# Patient Record
Sex: Female | Born: 1940 | Race: White | Hispanic: No | Smoking: Never smoker
Health system: Southern US, Community
[De-identification: ages and names within clinical notes are randomized; demographics above are authoritative.]

## PROBLEM LIST (undated history)

## (undated) DIAGNOSIS — T7840XA Allergy, unspecified, initial encounter: Secondary | ICD-10-CM

## (undated) DIAGNOSIS — I480 Paroxysmal atrial fibrillation: Secondary | ICD-10-CM

## (undated) DIAGNOSIS — I10 Essential (primary) hypertension: Secondary | ICD-10-CM

## (undated) DIAGNOSIS — M1990 Unspecified osteoarthritis, unspecified site: Secondary | ICD-10-CM

## (undated) DIAGNOSIS — I499 Cardiac arrhythmia, unspecified: Secondary | ICD-10-CM

## (undated) DIAGNOSIS — N870 Mild cervical dysplasia: Secondary | ICD-10-CM

## (undated) DIAGNOSIS — E7800 Pure hypercholesterolemia, unspecified: Secondary | ICD-10-CM

## (undated) DIAGNOSIS — I350 Nonrheumatic aortic (valve) stenosis: Secondary | ICD-10-CM

## (undated) DIAGNOSIS — D249 Benign neoplasm of unspecified breast: Secondary | ICD-10-CM

## (undated) DIAGNOSIS — M353 Polymyalgia rheumatica: Secondary | ICD-10-CM

## (undated) DIAGNOSIS — E0590 Thyrotoxicosis, unspecified without thyrotoxic crisis or storm: Secondary | ICD-10-CM

## (undated) DIAGNOSIS — I4891 Unspecified atrial fibrillation: Secondary | ICD-10-CM

## (undated) DIAGNOSIS — R011 Cardiac murmur, unspecified: Secondary | ICD-10-CM

## (undated) DIAGNOSIS — J189 Pneumonia, unspecified organism: Secondary | ICD-10-CM

## (undated) DIAGNOSIS — I71012 Dissection of descending thoracic aorta: Secondary | ICD-10-CM

## (undated) DIAGNOSIS — K219 Gastro-esophageal reflux disease without esophagitis: Secondary | ICD-10-CM

## (undated) DIAGNOSIS — D219 Benign neoplasm of connective and other soft tissue, unspecified: Secondary | ICD-10-CM

## (undated) DIAGNOSIS — I2510 Atherosclerotic heart disease of native coronary artery without angina pectoris: Secondary | ICD-10-CM

## (undated) HISTORY — PX: CATARACT EXTRACTION: SUR2

## (undated) HISTORY — DX: Mild cervical dysplasia: N87.0

## (undated) HISTORY — DX: Allergy, unspecified, initial encounter: T78.40XA

## (undated) HISTORY — PX: JOINT REPLACEMENT: SHX530

## (undated) HISTORY — DX: Atherosclerotic heart disease of native coronary artery without angina pectoris: I25.10

## (undated) HISTORY — PX: EYE SURGERY: SHX253

## (undated) HISTORY — DX: Nonrheumatic aortic (valve) stenosis: I35.0

## (undated) HISTORY — DX: Unspecified atrial fibrillation: I48.91

## (undated) HISTORY — DX: Benign neoplasm of unspecified breast: D24.9

## (undated) HISTORY — PX: BREAST BIOPSY: SHX20

## (undated) HISTORY — DX: Benign neoplasm of connective and other soft tissue, unspecified: D21.9

## (undated) HISTORY — PX: COLONOSCOPY: SHX174

## (undated) HISTORY — DX: Cardiac murmur, unspecified: R01.1

---
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
## 1965-04-13 HISTORY — PX: THYROIDECTOMY: SHX17

## 1968-12-12 HISTORY — PX: BREAST BIOPSY: SHX20

## 1988-04-13 DIAGNOSIS — N870 Mild cervical dysplasia: Secondary | ICD-10-CM

## 1988-04-13 HISTORY — DX: Mild cervical dysplasia: N87.0

## 1998-02-05 ENCOUNTER — Other Ambulatory Visit: Admission: RE | Admit: 1998-02-05 | Discharge: 1998-02-05 | Payer: Self-pay | Admitting: Obstetrics and Gynecology

## 1998-04-13 HISTORY — PX: RETINAL DETACHMENT SURGERY: SHX105

## 1998-11-20 ENCOUNTER — Other Ambulatory Visit: Admission: RE | Admit: 1998-11-20 | Discharge: 1998-11-20 | Payer: Self-pay | Admitting: Obstetrics and Gynecology

## 1999-12-10 ENCOUNTER — Other Ambulatory Visit: Admission: RE | Admit: 1999-12-10 | Discharge: 1999-12-10 | Payer: Self-pay | Admitting: Obstetrics and Gynecology

## 2000-02-11 ENCOUNTER — Encounter: Payer: Self-pay | Admitting: Ophthalmology

## 2000-02-11 ENCOUNTER — Ambulatory Visit (HOSPITAL_COMMUNITY): Admission: RE | Admit: 2000-02-11 | Discharge: 2000-02-11 | Payer: Self-pay | Admitting: Ophthalmology

## 2000-04-13 DIAGNOSIS — D219 Benign neoplasm of connective and other soft tissue, unspecified: Secondary | ICD-10-CM | POA: Insufficient documentation

## 2000-04-13 HISTORY — DX: Benign neoplasm of connective and other soft tissue, unspecified: D21.9

## 2001-02-01 ENCOUNTER — Other Ambulatory Visit: Admission: RE | Admit: 2001-02-01 | Discharge: 2001-02-01 | Payer: Self-pay | Admitting: Obstetrics and Gynecology

## 2001-02-04 ENCOUNTER — Encounter: Payer: Self-pay | Admitting: Obstetrics and Gynecology

## 2001-02-04 ENCOUNTER — Encounter: Admission: RE | Admit: 2001-02-04 | Discharge: 2001-02-04 | Payer: Self-pay | Admitting: Obstetrics and Gynecology

## 2002-01-16 ENCOUNTER — Ambulatory Visit (HOSPITAL_COMMUNITY): Admission: RE | Admit: 2002-01-16 | Discharge: 2002-01-16 | Payer: Self-pay | Admitting: Gastroenterology

## 2002-01-16 ENCOUNTER — Encounter (INDEPENDENT_AMBULATORY_CARE_PROVIDER_SITE_OTHER): Payer: Self-pay | Admitting: Specialist

## 2002-02-27 ENCOUNTER — Other Ambulatory Visit: Admission: RE | Admit: 2002-02-27 | Discharge: 2002-02-27 | Payer: Self-pay | Admitting: Obstetrics and Gynecology

## 2002-09-19 ENCOUNTER — Encounter: Admission: RE | Admit: 2002-09-19 | Discharge: 2002-09-19 | Payer: Self-pay | Admitting: Internal Medicine

## 2002-09-19 ENCOUNTER — Encounter: Payer: Self-pay | Admitting: Internal Medicine

## 2003-01-26 ENCOUNTER — Encounter: Admission: RE | Admit: 2003-01-26 | Discharge: 2003-01-26 | Payer: Self-pay | Admitting: Obstetrics and Gynecology

## 2003-01-26 ENCOUNTER — Encounter: Payer: Self-pay | Admitting: Obstetrics and Gynecology

## 2003-03-05 ENCOUNTER — Other Ambulatory Visit: Admission: RE | Admit: 2003-03-05 | Discharge: 2003-03-05 | Payer: Self-pay | Admitting: Obstetrics and Gynecology

## 2004-03-25 ENCOUNTER — Other Ambulatory Visit: Admission: RE | Admit: 2004-03-25 | Discharge: 2004-03-25 | Payer: Self-pay | Admitting: Obstetrics and Gynecology

## 2005-07-09 ENCOUNTER — Encounter: Admission: RE | Admit: 2005-07-09 | Discharge: 2005-07-09 | Payer: Self-pay | Admitting: Internal Medicine

## 2005-09-23 ENCOUNTER — Other Ambulatory Visit: Admission: RE | Admit: 2005-09-23 | Discharge: 2005-09-23 | Payer: Self-pay | Admitting: Obstetrics and Gynecology

## 2006-04-22 ENCOUNTER — Encounter: Admission: RE | Admit: 2006-04-22 | Discharge: 2006-04-22 | Payer: Self-pay | Admitting: Internal Medicine

## 2006-05-13 ENCOUNTER — Encounter: Admission: RE | Admit: 2006-05-13 | Discharge: 2006-05-13 | Payer: Self-pay | Admitting: Internal Medicine

## 2006-10-07 ENCOUNTER — Other Ambulatory Visit: Admission: RE | Admit: 2006-10-07 | Discharge: 2006-10-07 | Payer: Self-pay | Admitting: Obstetrics and Gynecology

## 2007-01-27 ENCOUNTER — Encounter: Admission: RE | Admit: 2007-01-27 | Discharge: 2007-01-27 | Payer: Self-pay | Admitting: Gastroenterology

## 2007-11-14 ENCOUNTER — Other Ambulatory Visit: Admission: RE | Admit: 2007-11-14 | Discharge: 2007-11-14 | Payer: Self-pay | Admitting: Obstetrics and Gynecology

## 2010-08-29 NOTE — Op Note (Signed)
   Marissa Powers, Marissa Powers                              ACCOUNT NO.:  [id number]   MEDICAL RECORD NO.:  [id number]                   PATIENT TYPE:  AMB   LOCATION:  ENDO                                 FACILITY:  [facility]   PHYSICIAN:  Florencia Reasons, M.D.             DATE OF BIRTH:  Dec 22, 1940   DATE OF PROCEDURE:  01/16/2002  DATE OF DISCHARGE:                                 OPERATIVE REPORT   PROCEDURE:  Colonoscopy with biopsy.   INDICATIONS:  Screening for colon cancer in a 70 year old female.   FINDINGS:  Diminutive rectal polyp.  Minimal diverticulosis.   DESCRIPTION OF PROCEDURE:  The nature, purpose, and risks of the procedure  had been reviewed with the patient, who provided written consent.  Sedation  was fentanyl 100 mcg and Versed 10 mg IV without arrhythmias or  desaturation.  The Olympus adjustable-tension pediatric video colonoscope  was advanced with some looping to the cecum (the adult scope might be a  better choice for future exams).  The quality of the prep was excellent, and  it is felt that all areas were well-seen during pullback.   There was minimal diverticulosis and a 2-3 mm sessile polyp at 19 cm from  the external anal opening, removed by several cold biopsies.  No large  polyps, cancer, colitis, or vascular malformations were observed.  Retroflexion could not be accomplished in the rectum due to a small rectal  ampulla, but antegrade viewing disclosed no distal rectal lesions and  reinspection of the rectosigmoid was unremarkable.  The patient tolerated  the procedure well, and there were no apparent complications.   IMPRESSION:  1. Diminutive rectosigmoid polyp, removed.  2. Minimal diverticulosis.   PLAN:  Await pathology on the polyp.                                               Florencia Reasons, M.D.    RVB/MEDQ  D:  01/16/2002  T:  01/17/2002  Job:  [id number]   cc:   Theressa Millard, M.D.  [address] [place]  [place] [address]  Fax:  [phone]

## 2010-08-29 NOTE — Op Note (Signed)
[name]  Patient:    Marissa Powers, Marissa Powers                           MRN: [id number] Proc. Date: 02/11/00 Adm. Date:  [id number] Disc. Date: [id number] Attending:  Ernesto Rutherford CC:         Oley Balm. Charlann Boxer, M.D.   Operative Report  PREOPERATIVE DIAGNOSES: 1. 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 2. History of retinal break and shallow retinal detachment, treated previously    retinal cryopexy in the office some 10 weeks previously.  PROCEDURE: 1. Scleral buckle, left eye. 2. Retinal cryopexy, left eye. 3. Anterior chamber tap to soften the globe of the left eye.  SURGEON:  Ernesto Rutherford, M.D.  ANESTHESIA:  General endotracheal anesthesia.  INDICATIONS FOR PROCEDURE:  The patient is a 70 year old woman who has visual field loss in the inferotemporal quadrant of her left eye and found to have a new onset floaters two days previous to evaluation on February 09, 2000, where a shallow retinal detachment was verified on examination and on ultrasound in the superonasal quadrant.  The apparent break appears to be a rent of the previous cryopexy and reformation of the break on the basis of persistent and recurrent vitreous retraction in this area.  The patient understands this is an attempt to surgically reattach her retina, to reduce the traction, and to allow for sustaining excellent visual acuity.  She understands the risks of anesthesia, including risk of death and loss of the eye, risks including hemorrhage, complex of infection, scarring, need for further surgery, a change in vision, loss of vision or progressive disease despite intervention.  DESCRIPTION OF PROCEDURE:  After appropriate signed consent was obtained, patient taken to the operating room.  In the operating room, general endotracheal anesthesia instituted without difficulty.  Left intraocular region was sterilely prepped and draped in the usual ophthalmic fashion.  A lid  speculum was applied.  Conjunctival peritomy fashioned from the 7:30 meridian superonasally to the 1 oclock meridian.  The medial and superior rectus muscles were isolated down 2-0 silk ties after entering the intermuscular septum in the inferonasal, superonasal, and superotemporal quadrants.  Indirect ophthalmoscopy was then performed to delineate the extent of the detachment and the margins of the break, which were treated with retinal cryopexy, and the margins of the detachment were delineated with external diathermy to allow for placement of appropriate-sized radial sponge. A 506 radial sponge was selected and secured with two 5-0 Mersilene mattress sutures.  The globe was softened after paracentesis of the anterior chamber to allow for excellent scleral indentation.  The sclera was indented extremely well and the sponge tied into place in a permanent fashion. Then the buckle was irrigated with bug juice.  Indirect ophthalmoscopy confirmed this excellent scleral indentation and support to the retinal break edges and all fluid had been reabsorbed.  Excellent cryopexy was seen.  At this time, the conjunctiva was brought forward and closed with 7-0 Vicryl suture to the limbus.  Subconjunctival injection of antibiotics was applied.  Intravenous antibiotics given.  The patient awakened from anesthesia and taken to the recovery room in stable condition and tolerated the procedure without complication. DD:  02/11/00 TD:  02/12/00 Job: [id number]

## 2011-04-21 DIAGNOSIS — E039 Hypothyroidism, unspecified: Secondary | ICD-10-CM | POA: Diagnosis not present

## 2011-04-21 DIAGNOSIS — R209 Unspecified disturbances of skin sensation: Secondary | ICD-10-CM | POA: Diagnosis not present

## 2011-04-21 DIAGNOSIS — I10 Essential (primary) hypertension: Secondary | ICD-10-CM | POA: Diagnosis not present

## 2011-04-21 DIAGNOSIS — Z23 Encounter for immunization: Secondary | ICD-10-CM | POA: Diagnosis not present

## 2011-04-21 DIAGNOSIS — E7800 Pure hypercholesterolemia, unspecified: Secondary | ICD-10-CM | POA: Diagnosis not present

## 2011-05-19 DIAGNOSIS — Z1231 Encounter for screening mammogram for malignant neoplasm of breast: Secondary | ICD-10-CM | POA: Diagnosis not present

## 2011-05-25 DIAGNOSIS — N6489 Other specified disorders of breast: Secondary | ICD-10-CM | POA: Diagnosis not present

## 2011-07-09 DIAGNOSIS — H33059 Total retinal detachment, unspecified eye: Secondary | ICD-10-CM | POA: Diagnosis not present

## 2011-07-09 DIAGNOSIS — H43819 Vitreous degeneration, unspecified eye: Secondary | ICD-10-CM | POA: Diagnosis not present

## 2011-07-09 DIAGNOSIS — H43399 Other vitreous opacities, unspecified eye: Secondary | ICD-10-CM | POA: Diagnosis not present

## 2011-08-31 DIAGNOSIS — L821 Other seborrheic keratosis: Secondary | ICD-10-CM | POA: Diagnosis not present

## 2011-08-31 DIAGNOSIS — L578 Other skin changes due to chronic exposure to nonionizing radiation: Secondary | ICD-10-CM | POA: Diagnosis not present

## 2011-08-31 DIAGNOSIS — D239 Other benign neoplasm of skin, unspecified: Secondary | ICD-10-CM | POA: Diagnosis not present

## 2011-08-31 DIAGNOSIS — L570 Actinic keratosis: Secondary | ICD-10-CM | POA: Diagnosis not present

## 2011-11-23 DIAGNOSIS — M1710 Unilateral primary osteoarthritis, unspecified knee: Secondary | ICD-10-CM | POA: Diagnosis not present

## 2011-11-23 DIAGNOSIS — IMO0002 Reserved for concepts with insufficient information to code with codable children: Secondary | ICD-10-CM | POA: Diagnosis not present

## 2012-02-05 DIAGNOSIS — Z23 Encounter for immunization: Secondary | ICD-10-CM | POA: Diagnosis not present

## 2012-02-10 DIAGNOSIS — Z01419 Encounter for gynecological examination (general) (routine) without abnormal findings: Secondary | ICD-10-CM | POA: Diagnosis not present

## 2012-02-10 DIAGNOSIS — Z124 Encounter for screening for malignant neoplasm of cervix: Secondary | ICD-10-CM | POA: Diagnosis not present

## 2012-02-12 DIAGNOSIS — Z961 Presence of intraocular lens: Secondary | ICD-10-CM | POA: Diagnosis not present

## 2012-02-16 DIAGNOSIS — M1710 Unilateral primary osteoarthritis, unspecified knee: Secondary | ICD-10-CM | POA: Diagnosis not present

## 2012-02-16 DIAGNOSIS — IMO0002 Reserved for concepts with insufficient information to code with codable children: Secondary | ICD-10-CM | POA: Diagnosis not present

## 2012-02-25 ENCOUNTER — Other Ambulatory Visit: Payer: Self-pay | Admitting: Orthopedic Surgery

## 2012-02-25 MED ORDER — DEXAMETHASONE SODIUM PHOSPHATE 10 MG/ML IJ SOLN: 10.0000 mg | Freq: Once | INTRAMUSCULAR | Status: DC

## 2012-02-25 NOTE — Progress Notes (Signed)
Preoperative surgical orders have been place into the Epic hospital system for Marissa Powers on 02/25/2012, 11:49 AM  by Patrica Duel for surgery on 04/25/12.  Preop Bilateral Total Knee orders including Epidural per Anesthesia, IV Tylenol, and IV Decadron as long as there are no contraindications to the above medications. [name], PA-C

## 2012-03-02 DIAGNOSIS — IMO0002 Reserved for concepts with insufficient information to code with codable children: Secondary | ICD-10-CM | POA: Diagnosis not present

## 2012-03-02 DIAGNOSIS — E7800 Pure hypercholesterolemia, unspecified: Secondary | ICD-10-CM | POA: Diagnosis not present

## 2012-03-02 DIAGNOSIS — E039 Hypothyroidism, unspecified: Secondary | ICD-10-CM | POA: Diagnosis not present

## 2012-03-02 DIAGNOSIS — M1710 Unilateral primary osteoarthritis, unspecified knee: Secondary | ICD-10-CM | POA: Diagnosis not present

## 2012-03-02 DIAGNOSIS — I700 Atherosclerosis of aorta: Secondary | ICD-10-CM | POA: Diagnosis not present

## 2012-03-02 DIAGNOSIS — I10 Essential (primary) hypertension: Secondary | ICD-10-CM | POA: Diagnosis not present

## 2012-03-14 DIAGNOSIS — Z86010 Personal history of colonic polyps: Secondary | ICD-10-CM | POA: Diagnosis not present

## 2012-03-14 DIAGNOSIS — Z09 Encounter for follow-up examination after completed treatment for conditions other than malignant neoplasm: Secondary | ICD-10-CM | POA: Diagnosis not present

## 2012-03-14 DIAGNOSIS — K5730 Diverticulosis of large intestine without perforation or abscess without bleeding: Secondary | ICD-10-CM | POA: Diagnosis not present

## 2012-04-07 ENCOUNTER — Encounter (HOSPITAL_COMMUNITY): Payer: Self-pay

## 2012-04-07 ENCOUNTER — Ambulatory Visit (HOSPITAL_COMMUNITY)
Admission: RE | Admit: 2012-04-07 | Discharge: 2012-04-07 | Disposition: A | Discharge status: Home / Self Care | Payer: Medicare Other | Source: Ambulatory Visit | Attending: Orthopedic Surgery | Admitting: Orthopedic Surgery

## 2012-04-07 ENCOUNTER — Encounter (HOSPITAL_COMMUNITY): Payer: Self-pay | Admitting: Pharmacy Technician

## 2012-04-07 ENCOUNTER — Encounter (HOSPITAL_COMMUNITY)
Admission: RE | Admit: 2012-04-07 | Discharge: 2012-04-07 | Disposition: A | Discharge status: Home / Self Care | Payer: Medicare Other | Source: Ambulatory Visit | Attending: Orthopedic Surgery | Admitting: Orthopedic Surgery

## 2012-04-07 DIAGNOSIS — I10 Essential (primary) hypertension: Secondary | ICD-10-CM | POA: Diagnosis not present

## 2012-04-07 DIAGNOSIS — Z01818 Encounter for other preprocedural examination: Secondary | ICD-10-CM | POA: Diagnosis not present

## 2012-04-07 HISTORY — DX: Pure hypercholesterolemia, unspecified: E78.00

## 2012-04-07 HISTORY — DX: Unspecified osteoarthritis, unspecified site: M19.90

## 2012-04-07 HISTORY — DX: Gastro-esophageal reflux disease without esophagitis: K21.9

## 2012-04-07 HISTORY — DX: Essential (primary) hypertension: I10

## 2012-04-07 HISTORY — DX: Thyrotoxicosis, unspecified without thyrotoxic crisis or storm: E05.90

## 2012-04-07 LAB — CBC
HCT: 36.2 % (ref 36.0–46.0)
Hemoglobin: 12.3 g/dL (ref 12.0–15.0)
MCH: 32.9 pg (ref 26.0–34.0)
MCHC: 34 g/dL (ref 30.0–36.0)
MCV: 96.8 fL (ref 78.0–100.0)
Platelets: 235 10*3/uL (ref 150–400)
RBC: 3.74 MIL/uL — ABNORMAL LOW (ref 3.87–5.11)
RDW: 13 % (ref 11.5–15.5)
WBC: 6.2 10*3/uL (ref 4.0–10.5)

## 2012-04-07 LAB — URINALYSIS, ROUTINE W REFLEX MICROSCOPIC
Bilirubin Urine: NEGATIVE
Glucose, UA: NEGATIVE mg/dL
Hgb urine dipstick: NEGATIVE
Ketones, ur: NEGATIVE mg/dL
Nitrite: NEGATIVE
Protein, ur: NEGATIVE mg/dL
Specific Gravity, Urine: 1.023 (ref 1.005–1.030)
Urobilinogen, UA: 0.2 mg/dL (ref 0.0–1.0)
pH: 5.5 (ref 5.0–8.0)

## 2012-04-07 LAB — COMPREHENSIVE METABOLIC PANEL
ALT: 11 U/L (ref 0–35)
AST: 16 U/L (ref 0–37)
Albumin: 4.1 g/dL (ref 3.5–5.2)
Alkaline Phosphatase: 59 U/L (ref 39–117)
BUN: 16 mg/dL (ref 6–23)
CO2: 27 mEq/L (ref 19–32)
Calcium: 10.1 mg/dL (ref 8.4–10.5)
Chloride: 104 mEq/L (ref 96–112)
Creatinine, Ser: 0.71 mg/dL (ref 0.50–1.10)
GFR calc Af Amer: >90 mL/min (ref >90)
GFR calc non Af Amer: 85 mL/min — ABNORMAL LOW (ref >90)
Glucose, Bld: 99 mg/dL (ref 70–99)
Potassium: 4.6 mEq/L (ref 3.5–5.1)
Sodium: 140 mEq/L (ref 135–145)
Total Bilirubin: 0.6 mg/dL (ref 0.3–1.2)
Total Protein: 7.5 g/dL (ref 6.0–8.3)

## 2012-04-07 LAB — URINE MICROSCOPIC-ADD ON

## 2012-04-07 LAB — SURGICAL PCR SCREEN
MRSA, PCR: NEGATIVE
Staphylococcus aureus: NEGATIVE

## 2012-04-07 LAB — PROTIME-INR
INR: 1.01 (ref 0.00–1.49)
Prothrombin Time: 13.2 seconds (ref 11.6–15.2)

## 2012-04-07 LAB — APTT: aPTT: 30 seconds (ref 24–37)

## 2012-04-07 NOTE — Patient Instructions (Addendum)
20 SYLVESTER SALONGA  04/07/2012   Your procedure is scheduled on: 04/25/12  Report to [facility] at [phone].  Call this number if you have problems the morning of surgery 336-: [phone]   Remember:   Do not eat food or drink liquids After Midnight.     Take these medicines the morning of surgery with A SIP OF WATER: nexium, synthroid   Do not wear jewelry, make-up or nail polish.  Do not wear lotions, powders, or perfumes. You may wear deodorant.  Do not shave 48 hours prior to surgery. Men may shave face and neck.  Do not bring valuables to the hospital.  Contacts, dentures or bridgework may not be worn into surgery.  Leave suitcase in the car. After surgery it may be brought to your room.  For patients admitted to the hospital, checkout time is 11:00 AM the day of discharge.     Please read over the following fact sheets that you were given: MRSA Information, blood fact sheet, incentive spirometer fact sheet Birdie Sons, RN  pre op nurse call if needed [phone]    FAILURE TO FOLLOW THESE INSTRUCTIONS MAY RESULT IN CANCELLATION OF YOUR SURGERY   Patient Signature: ___________________________________________

## 2012-04-08 LAB — URINE CULTURE
Colony Count: NO GROWTH
Culture: NO GROWTH

## 2012-04-11 ENCOUNTER — Other Ambulatory Visit: Payer: Self-pay | Admitting: Orthopedic Surgery

## 2012-04-11 NOTE — Progress Notes (Signed)
Surgery clearance note from Dr. Earl Gala 04/21/11 on chart

## 2012-04-11 NOTE — H&P (Signed)
Marissa Powers  DOB: 11/26/1940 Married / Language: English / Race: White Female  Date of Admission:  04/25/2012  Chief complaint:  Bilateral Knee Pain  History of Present Illness The patient is a 71 year old female who comes in for a preoperative History and Physical. The patient is scheduled for a bilateral total knee arthroplasty to be performed by Dr. Frank V. Aluisio, MD at [facility] on 04/25/2012. The patient is a 71 year old female who presents with knee complaints. The patient reports left knee (worse than right) symptoms including: pain, instability, catching and stiffness which began year(s) ago.The patient feels that the symptoms are worsening. The patient has the current diagnosis of knee osteoarthritis. Prior to being seen today the patient was previously evaluated in this clinic (by Dr. Donn). Previous work-up for this problem has included knee x-rays. Past treatment for this problem has included intra-articular injection of corticosteroids (recent injections have not helped). Current treatment includes nonsteroidal anti-inflammatory drugs (Advil prn). Marissa Powers feels as though the knees are preventing her from doing the things she desires. She used to be an avid tennis player and used to get out and do a lot of walking. Because of the knees she is not doing as much as she used to. She would like to improve her activity level but can not do so because of her knees. She is not having any swelling. Pain occurs mainly with activity. She is able to sleep at night. Knees occasionally feel like they want to give out on her. She can not trust what her knees are going to do especially on uneven ground or stairs. She has had cortisone injections in the past with less benefit with each successive injection. She has not had visco supplements in the past.  She is now ready to proceed with bilateral knee replacement procedures. They have been treated conservatively in the past for  the above stated problem and despite conservative measures, they continue to have progressive pain and severe functional limitations and dysfunction. They have failed non-operative management including home exercise, medications, and injections. It is felt that they would benefit from undergoing total joint replacement. Risks and benefits of the procedure have been discussed with the patient and they elect to proceed with surgery. There are no active contraindications to surgery such as ongoing infection or rapidly progressive neurological disease.   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 grandmother mothers side Heart Disease. grandfather mothers side and grandfather fathers side Hypertension. mother   Social History Living situation. live alone Marital status. widowed Number of flights of stairs before winded. 2-3 Drug/Alcohol Rehab (Currently). no Exercise. Exercises weekly; does running / walking and other Illicit drug use. no Pain Contract. no Previously in rehab. no Tobacco / smoke exposure. no Tobacco use. never smoker Current work status. retired Alcohol use. current drinker; drinks wine and hard liquor; 5-7 per week Children. 2 Post-Surgical Plans. Plan is to go to [facility]. Advance Directives. Living Will, Healthcare POA   Medication History Metoprolol Tartrate (25MG Tablet, Oral) Active. Lisinopril (20MG Tablet, Oral) Active. NexIUM (40MG Capsule DR, Oral) Active. Synthroid (112MCG Tablet, Oral) Active. Vitamin D ( Oral) Specific dose unknown - Active. Aspirin EC (81MG Tablet DR, Oral) Active. Fish Oil Active. Citracal/Vitamin D ( Oral) Specific dose unknown - Active.   Pregnancy / Birth History Pregnant. no   Past Surgical History Tonsillectomy Cataract Surgery. bilateral Colon Polyp   Removal - Colonoscopy Thyroidectomy; Subtotal   Medical  History Lumbar stenosis (724.02) Hyperthyroidism High blood pressure Hypercholesterolemia Gastroesophageal Reflux Disease Menopause   Review of Systems General:Not Present- Chills, Fever, Night Sweats, Fatigue, Weight Gain, Weight Loss and Memory Loss. Skin:Not Present- Hives, Itching, Rash, Eczema and Lesions. HEENT:Not Present- Tinnitus, Headache, Double Vision, Visual Loss, Hearing Loss and Dentures. Respiratory:Not Present- Shortness of breath with exertion, Shortness of breath at rest, Allergies, Coughing up blood and Chronic Cough. Cardiovascular:Not Present- Chest Pain, Racing/skipping heartbeats, Difficulty Breathing Lying Down, Murmur, Swelling and Palpitations. Gastrointestinal:Not Present- Bloody Stool, Heartburn, Abdominal Pain, Vomiting, Nausea, Constipation, Diarrhea, Difficulty Swallowing, Jaundice and Loss of appetitie. Female Genitourinary:Not Present- Blood in Urine, Urinary frequency, Weak urinary stream, Discharge, Flank Pain, Incontinence, Painful Urination, Urgency, Urinary Retention and Urinating at Night. Musculoskeletal:Present- Joint Pain. Not Present- Muscle Weakness, Muscle Pain, Joint Swelling, Back Pain, Morning Stiffness and Spasms. Neurological:Not Present- Tremor, Dizziness, Blackout spells, Paralysis, Difficulty with balance and Weakness. Psychiatric:Not Present- Insomnia.   Vitals Weight: 180 lb Height: 65.5 in Weight was reported by patient. Height was reported by patient. Body Surface Area: 1.94 m Body Mass Index: 29.5 kg/m Pulse: 68 (Regular) Resp.: 12 (Unlabored) BP: 148/82 (Sitting, Left Arm, Standard)    Physical Exam The physical exam findings are as follows:  Note: Patient is a 71 year old female with continued knee pain.   General Mental Status - Alert, cooperative and good historian. General Appearance- pleasant. Not in acute distress. Orientation- Oriented X3. Build & Nutrition- Well nourished and  Well developed.   Head and Neck Head- normocephalic, atraumatic . Neck Global Assessment- supple. no bruit auscultated on the right and no bruit auscultated on the left.   Eye Pupil- Bilateral- Regular and Round. Motion- Bilateral- EOMI.   Chest and Lung Exam Auscultation: Breath sounds:- clear at anterior chest wall and - clear at posterior chest wall. Adventitious sounds:- No Adventitious sounds.   Cardiovascular Auscultation:Rhythm- Regular rate and rhythm. Heart Sounds- S1 WNL and S2 WNL. Murmurs & Other Heart Sounds:Auscultation of the heart reveals - No Murmurs.   Abdomen Palpation/Percussion:Tenderness- Abdomen is non-tender to palpation. Rigidity (guarding)- Abdomen is soft. Auscultation:Auscultation of the abdomen reveals - Bowel sounds normal.   Female Genitourinary Not done, not pertinent to present illness  Musculoskeletal Very pleasant, well developed female alert and oriented in no apparent distress. Evaluation of her hips show normal range of motion with no discomfort. Her left knee shows varus deformity. Range about 5 to 125. She has tenderness over the medial greater than lateral jointline. There is no instability noted about the knee. Right knee less varus. Range 5 to 125. Moderate crepitus on range of motion. Tender medial greater than lateral with no instability noted. Pulses, sensation and motor are intact both lower extremities. She has varus thrust gait pattern on both knees.  RADIOGRAPHS: Radiographs are reviewed, AP both knees and lateral showing bone on bone arthritis in the medial and patellofemoral compartments of both knees. She has varus deformity left worse than right knee.  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 Aluisio.  Plan is to go to [facility] following the hospital stay.  PCP - Dr. James Osborne - Patient has been seen  preoperatively and felt to be stable for surgery. Cards - Dr. Hank Smith  Signed electronically by DREW L Valeen Borys, PA-C 

## 2012-04-11 NOTE — Progress Notes (Signed)
Abnormal urine results routed to Dr. Lequita Halt via [facility]

## 2012-04-25 ENCOUNTER — Encounter (HOSPITAL_COMMUNITY): Payer: Self-pay | Admitting: *Deleted

## 2012-04-25 ENCOUNTER — Inpatient Hospital Stay (HOSPITAL_COMMUNITY): Payer: Medicare Other | Admitting: Anesthesiology

## 2012-04-25 ENCOUNTER — Encounter (HOSPITAL_COMMUNITY): Payer: Self-pay | Admitting: Anesthesiology

## 2012-04-25 ENCOUNTER — Inpatient Hospital Stay (HOSPITAL_COMMUNITY)
Admission: RE | Admit: 2012-04-25 | Discharge: 2012-04-29 | DRG: 462 | Disposition: A | Discharge status: Skilled Nursing Facility | Payer: Medicare Other | Source: Ambulatory Visit | Attending: Orthopedic Surgery | Admitting: Orthopedic Surgery

## 2012-04-25 ENCOUNTER — Encounter (HOSPITAL_COMMUNITY)
Admission: RE | Disposition: A | Discharge status: Skilled Nursing Facility | Payer: Self-pay | Source: Ambulatory Visit | Attending: Orthopedic Surgery

## 2012-04-25 DIAGNOSIS — E7800 Pure hypercholesterolemia, unspecified: Secondary | ICD-10-CM | POA: Diagnosis present

## 2012-04-25 DIAGNOSIS — D62 Acute posthemorrhagic anemia: Secondary | ICD-10-CM | POA: Diagnosis not present

## 2012-04-25 DIAGNOSIS — M1710 Unilateral primary osteoarthritis, unspecified knee: Secondary | ICD-10-CM | POA: Diagnosis not present

## 2012-04-25 DIAGNOSIS — Z79899 Other long term (current) drug therapy: Secondary | ICD-10-CM

## 2012-04-25 DIAGNOSIS — M179 Osteoarthritis of knee, unspecified: Secondary | ICD-10-CM | POA: Diagnosis present

## 2012-04-25 DIAGNOSIS — M6281 Muscle weakness (generalized): Secondary | ICD-10-CM | POA: Diagnosis not present

## 2012-04-25 DIAGNOSIS — S8990XA Unspecified injury of unspecified lower leg, initial encounter: Secondary | ICD-10-CM | POA: Diagnosis not present

## 2012-04-25 DIAGNOSIS — Z96659 Presence of unspecified artificial knee joint: Secondary | ICD-10-CM

## 2012-04-25 DIAGNOSIS — M1990 Unspecified osteoarthritis, unspecified site: Secondary | ICD-10-CM | POA: Diagnosis not present

## 2012-04-25 DIAGNOSIS — K219 Gastro-esophageal reflux disease without esophagitis: Secondary | ICD-10-CM | POA: Diagnosis present

## 2012-04-25 DIAGNOSIS — G4700 Insomnia, unspecified: Secondary | ICD-10-CM | POA: Diagnosis not present

## 2012-04-25 DIAGNOSIS — R269 Unspecified abnormalities of gait and mobility: Secondary | ICD-10-CM | POA: Diagnosis not present

## 2012-04-25 DIAGNOSIS — I10 Essential (primary) hypertension: Secondary | ICD-10-CM | POA: Diagnosis present

## 2012-04-25 DIAGNOSIS — R279 Unspecified lack of coordination: Secondary | ICD-10-CM | POA: Diagnosis not present

## 2012-04-25 DIAGNOSIS — G8918 Other acute postprocedural pain: Secondary | ICD-10-CM | POA: Diagnosis not present

## 2012-04-25 DIAGNOSIS — Z471 Aftercare following joint replacement surgery: Secondary | ICD-10-CM | POA: Diagnosis not present

## 2012-04-25 DIAGNOSIS — M48061 Spinal stenosis, lumbar region without neurogenic claudication: Secondary | ICD-10-CM | POA: Diagnosis present

## 2012-04-25 DIAGNOSIS — K2100 Gastro-esophageal reflux disease with esophagitis, without bleeding: Secondary | ICD-10-CM | POA: Diagnosis not present

## 2012-04-25 DIAGNOSIS — E039 Hypothyroidism, unspecified: Secondary | ICD-10-CM | POA: Diagnosis not present

## 2012-04-25 DIAGNOSIS — E0590 Thyrotoxicosis, unspecified without thyrotoxic crisis or storm: Secondary | ICD-10-CM | POA: Diagnosis not present

## 2012-04-25 DIAGNOSIS — E785 Hyperlipidemia, unspecified: Secondary | ICD-10-CM | POA: Diagnosis not present

## 2012-04-25 DIAGNOSIS — M25569 Pain in unspecified knee: Secondary | ICD-10-CM | POA: Diagnosis not present

## 2012-04-25 DIAGNOSIS — IMO0002 Reserved for concepts with insufficient information to code with codable children: Secondary | ICD-10-CM | POA: Diagnosis not present

## 2012-04-25 HISTORY — PX: TOTAL KNEE ARTHROPLASTY: SHX125

## 2012-04-25 LAB — TYPE AND SCREEN
ABO/RH(D): O POS
Antibody Screen: NEGATIVE

## 2012-04-25 LAB — ABO/RH: ABO/RH(D): O POS

## 2012-04-25 SURGERY — ARTHROPLASTY, KNEE, BILATERAL, TOTAL
Anesthesia: Spinal | Site: Knee | Laterality: Bilateral | Wound class: Clean

## 2012-04-25 MED ORDER — TRAMADOL HCL 50 MG PO TABS: 50.0000 mg | ORAL_TABLET | Freq: Four times a day (QID) | ORAL | Status: DC | PRN

## 2012-04-25 MED ORDER — PHENOL 1.4 % MT LIQD: 1.0000 | OROMUCOSAL | Status: DC | PRN

## 2012-04-25 MED ORDER — MORPHINE SULFATE 2 MG/ML IJ SOLN
1.0000 mg | INTRAMUSCULAR | Status: DC | PRN
  Administered 2012-04-25: 2 mg via INTRAVENOUS
  Administered 2012-04-25 – 2012-04-28 (×2): 1 mg via INTRAVENOUS
  Filled 2012-04-25 (×3): qty 1

## 2012-04-25 MED ORDER — TRANEXAMIC ACID 100 MG/ML IV SOLN
1230.0000 mg | INTRAVENOUS | Status: AC
  Administered 2012-04-25: 1230 mg via INTRAVENOUS
  Filled 2012-04-25: qty 12.3

## 2012-04-25 MED ORDER — SIMVASTATIN 40 MG PO TABS
40.0000 mg | ORAL_TABLET | Freq: Every day | ORAL | Status: DC
  Administered 2012-04-25 – 2012-04-28 (×4): 40 mg via ORAL
  Filled 2012-04-25 (×5): qty 1

## 2012-04-25 MED ORDER — NALOXONE HCL 0.4 MG/ML IJ SOLN: 0.4000 mg | INTRAMUSCULAR | Status: DC | PRN

## 2012-04-25 MED ORDER — NALBUPHINE HCL 10 MG/ML IJ SOLN
5.0000 mg | INTRAMUSCULAR | Status: DC | PRN
  Filled 2012-04-25: qty 1

## 2012-04-25 MED ORDER — SODIUM CHLORIDE 0.9 % IV SOLN
INTRAVENOUS | Status: DC
  Administered 2012-04-25 – 2012-04-26 (×2): via INTRAVENOUS
  Administered 2012-04-27: 20 mL/h via INTRAVENOUS
  Administered 2012-04-27: 04:00:00 via INTRAVENOUS

## 2012-04-25 MED ORDER — METOCLOPRAMIDE HCL 5 MG/ML IJ SOLN
5.0000 mg | Freq: Three times a day (TID) | INTRAMUSCULAR | Status: DC | PRN
  Filled 2012-04-25: qty 2

## 2012-04-25 MED ORDER — LACTATED RINGERS IV SOLN
INTRAVENOUS | Status: DC
  Administered 2012-04-25: 12:00:00 via INTRAVENOUS
  Administered 2012-04-25: 1000 mL via INTRAVENOUS
  Administered 2012-04-25: 11:00:00 via INTRAVENOUS

## 2012-04-25 MED ORDER — BISACODYL 10 MG RE SUPP
10.0000 mg | Freq: Every day | RECTAL | Status: DC | PRN
  Administered 2012-04-29: 10 mg via RECTAL
  Filled 2012-04-25: qty 1

## 2012-04-25 MED ORDER — METOPROLOL SUCCINATE ER 25 MG PO TB24
25.0000 mg | ORAL_TABLET | Freq: Every day | ORAL | Status: DC
  Administered 2012-04-26 – 2012-04-27 (×2): 25 mg via ORAL
  Filled 2012-04-25 (×5): qty 1

## 2012-04-25 MED ORDER — METHOCARBAMOL 500 MG PO TABS
500.0000 mg | ORAL_TABLET | Freq: Four times a day (QID) | ORAL | Status: DC | PRN
  Administered 2012-04-27 – 2012-04-29 (×6): 500 mg via ORAL
  Filled 2012-04-25 (×7): qty 1

## 2012-04-25 MED ORDER — CEFAZOLIN SODIUM 1-5 GM-% IV SOLN
1.0000 g | Freq: Four times a day (QID) | INTRAVENOUS | Status: AC
  Administered 2012-04-25 (×2): 1 g via INTRAVENOUS
  Filled 2012-04-25 (×2): qty 50

## 2012-04-25 MED ORDER — ONDANSETRON HCL 4 MG/2ML IJ SOLN: 4.0000 mg | Freq: Four times a day (QID) | INTRAMUSCULAR | Status: DC | PRN

## 2012-04-25 MED ORDER — POLYETHYLENE GLYCOL 3350 17 G PO PACK
17.0000 g | PACK | Freq: Every day | ORAL | Status: DC | PRN
  Administered 2012-04-26: 17 g via ORAL

## 2012-04-25 MED ORDER — BUPIVACAINE-EPINEPHRINE PF 0.25-1:200000 % IJ SOLN
INTRAMUSCULAR | Status: DC | PRN
  Administered 2012-04-25: 5 mL

## 2012-04-25 MED ORDER — DIPHENHYDRAMINE HCL 50 MG/ML IJ SOLN: 12.5000 mg | INTRAMUSCULAR | Status: DC | PRN

## 2012-04-25 MED ORDER — DEXTROSE 5 % IV SOLN: 3.0000 g | INTRAVENOUS | Status: DC

## 2012-04-25 MED ORDER — 0.9 % SODIUM CHLORIDE (POUR BTL) OPTIME
TOPICAL | Status: DC | PRN
  Administered 2012-04-25: 1000 mL

## 2012-04-25 MED ORDER — ACETAMINOPHEN 650 MG RE SUPP: 650.0000 mg | Freq: Four times a day (QID) | RECTAL | Status: DC | PRN

## 2012-04-25 MED ORDER — DIPHENHYDRAMINE HCL 12.5 MG/5ML PO ELIX: 12.5000 mg | ORAL_SOLUTION | ORAL | Status: DC | PRN

## 2012-04-25 MED ORDER — DEXAMETHASONE 6 MG PO TABS
10.0000 mg | ORAL_TABLET | Freq: Once | ORAL | Status: AC
  Administered 2012-04-26: 10 mg via ORAL
  Filled 2012-04-25: qty 1

## 2012-04-25 MED ORDER — DOCUSATE SODIUM 100 MG PO CAPS
100.0000 mg | ORAL_CAPSULE | Freq: Two times a day (BID) | ORAL | Status: DC
  Administered 2012-04-25 – 2012-04-29 (×8): 100 mg via ORAL

## 2012-04-25 MED ORDER — FENTANYL CITRATE 0.05 MG/ML IJ SOLN: 25.0000 ug | INTRAMUSCULAR | Status: DC | PRN

## 2012-04-25 MED ORDER — METOCLOPRAMIDE HCL 5 MG/ML IJ SOLN
10.0000 mg | Freq: Three times a day (TID) | INTRAMUSCULAR | Status: DC | PRN
  Administered 2012-04-25: 10 mg via INTRAVENOUS

## 2012-04-25 MED ORDER — BUPIVACAINE ON-Q PAIN PUMP (FOR ORDER SET NO CHG)
INJECTION | Status: DC
  Filled 2012-04-25: qty 1

## 2012-04-25 MED ORDER — KETOROLAC TROMETHAMINE 30 MG/ML IJ SOLN: 30.0000 mg | Freq: Four times a day (QID) | INTRAMUSCULAR | Status: DC | PRN

## 2012-04-25 MED ORDER — DEXTROSE 5 % IV SOLN
500.0000 mg | Freq: Four times a day (QID) | INTRAVENOUS | Status: DC | PRN
  Administered 2012-04-25 – 2012-04-27 (×7): 500 mg via INTRAVENOUS
  Filled 2012-04-25 (×7): qty 5

## 2012-04-25 MED ORDER — LEVOTHYROXINE SODIUM 112 MCG PO TABS
112.0000 ug | ORAL_TABLET | Freq: Every morning | ORAL | Status: DC
  Administered 2012-04-26 – 2012-04-29 (×4): 112 ug via ORAL
  Filled 2012-04-25 (×5): qty 1

## 2012-04-25 MED ORDER — DEXAMETHASONE SODIUM PHOSPHATE 10 MG/ML IJ SOLN: 10.0000 mg | Freq: Once | INTRAMUSCULAR | Status: AC

## 2012-04-25 MED ORDER — DIPHENHYDRAMINE HCL 50 MG/ML IJ SOLN: 25.0000 mg | INTRAMUSCULAR | Status: DC | PRN

## 2012-04-25 MED ORDER — MEPERIDINE HCL 50 MG/ML IJ SOLN: 6.2500 mg | INTRAMUSCULAR | Status: DC | PRN

## 2012-04-25 MED ORDER — ZOLPIDEM TARTRATE 5 MG PO TABS
5.0000 mg | ORAL_TABLET | Freq: Every evening | ORAL | Status: DC | PRN
  Administered 2012-04-26 – 2012-04-28 (×3): 5 mg via ORAL
  Filled 2012-04-25 (×3): qty 1

## 2012-04-25 MED ORDER — MIDAZOLAM HCL 5 MG/5ML IJ SOLN
INTRAMUSCULAR | Status: DC | PRN
  Administered 2012-04-25 (×2): 2 mg via INTRAVENOUS

## 2012-04-25 MED ORDER — ACETAMINOPHEN 10 MG/ML IV SOLN
1000.0000 mg | Freq: Once | INTRAVENOUS | Status: AC
  Administered 2012-04-25: 1000 mg via INTRAVENOUS

## 2012-04-25 MED ORDER — CEFAZOLIN SODIUM-DEXTROSE 2-3 GM-% IV SOLR
2.0000 g | INTRAVENOUS | Status: AC
  Administered 2012-04-25: 2 g via INTRAVENOUS

## 2012-04-25 MED ORDER — SODIUM CHLORIDE 0.9 % IV SOLN
INTRAVENOUS | Status: DC
Start: 2012-04-25 — End: 2012-04-25

## 2012-04-25 MED ORDER — SODIUM CHLORIDE 0.9 % IR SOLN
Status: DC | PRN
  Administered 2012-04-25: 3000 mL

## 2012-04-25 MED ORDER — PROPOFOL 10 MG/ML IV EMUL
INTRAVENOUS | Status: DC | PRN
  Administered 2012-04-25: 70 ug/kg/min via INTRAVENOUS

## 2012-04-25 MED ORDER — FLEET ENEMA 7-19 GM/118ML RE ENEM: 1.0000 | ENEMA | Freq: Once | RECTAL | Status: AC | PRN

## 2012-04-25 MED ORDER — ACETAMINOPHEN 325 MG PO TABS
650.0000 mg | ORAL_TABLET | Freq: Four times a day (QID) | ORAL | Status: DC | PRN
  Administered 2012-04-27: 650 mg via ORAL
  Filled 2012-04-25: qty 2

## 2012-04-25 MED ORDER — FENTANYL CITRATE 0.05 MG/ML IJ SOLN
INTRAMUSCULAR | Status: DC | PRN
  Administered 2012-04-25: 50 ug via INTRAVENOUS
  Administered 2012-04-25: 100 ug via INTRAVENOUS

## 2012-04-25 MED ORDER — SODIUM CHLORIDE 0.9 % IV SOLN
INTRAVENOUS | Status: AC
  Administered 2012-04-25 – 2012-04-27 (×5): via EPIDURAL
  Filled 2012-04-25 (×14): qty 20

## 2012-04-25 MED ORDER — METOCLOPRAMIDE HCL 10 MG PO TABS: 5.0000 mg | ORAL_TABLET | Freq: Three times a day (TID) | ORAL | Status: DC | PRN

## 2012-04-25 MED ORDER — KETOROLAC TROMETHAMINE 60 MG/2ML IM SOLN
60.0000 mg | Freq: Once | INTRAMUSCULAR | Status: DC | PRN
  Filled 2012-04-25: qty 2

## 2012-04-25 MED ORDER — ONDANSETRON HCL 4 MG PO TABS: 4.0000 mg | ORAL_TABLET | Freq: Four times a day (QID) | ORAL | Status: DC | PRN

## 2012-04-25 MED ORDER — PROMETHAZINE HCL 25 MG/ML IJ SOLN: 6.2500 mg | INTRAMUSCULAR | Status: DC | PRN

## 2012-04-25 MED ORDER — NALOXONE HCL 1 MG/ML IJ SOLN
1.0000 ug/kg/h | INTRAVENOUS | Status: DC | PRN
  Filled 2012-04-25: qty 2

## 2012-04-25 MED ORDER — OXYCODONE HCL 5 MG PO TABS
5.0000 mg | ORAL_TABLET | ORAL | Status: DC | PRN
  Administered 2012-04-25 – 2012-04-26 (×7): 5 mg via ORAL
  Administered 2012-04-27 (×2): 10 mg via ORAL
  Administered 2012-04-27: 15 mg via ORAL
  Administered 2012-04-27: 10 mg via ORAL
  Filled 2012-04-25: qty 3
  Filled 2012-04-25 (×3): qty 2
  Filled 2012-04-25 (×2): qty 1
  Filled 2012-04-25: qty 2
  Filled 2012-04-25 (×2): qty 1
  Filled 2012-04-25: qty 2

## 2012-04-25 MED ORDER — SODIUM CHLORIDE 0.9 % IJ SOLN: 3.0000 mL | INTRAMUSCULAR | Status: DC | PRN

## 2012-04-25 MED ORDER — ONDANSETRON HCL 4 MG/2ML IJ SOLN: 4.0000 mg | Freq: Three times a day (TID) | INTRAMUSCULAR | Status: DC | PRN

## 2012-04-25 MED ORDER — ACETAMINOPHEN 10 MG/ML IV SOLN
1000.0000 mg | Freq: Four times a day (QID) | INTRAVENOUS | Status: AC
  Administered 2012-04-25 – 2012-04-26 (×4): 1000 mg via INTRAVENOUS
  Filled 2012-04-25 (×6): qty 100

## 2012-04-25 MED ORDER — RIVAROXABAN 10 MG PO TABS
10.0000 mg | ORAL_TABLET | Freq: Every day | ORAL | Status: DC
  Filled 2012-04-25 (×2): qty 1

## 2012-04-25 MED ORDER — SCOPOLAMINE 1 MG/3DAYS TD PT72
1.0000 | MEDICATED_PATCH | Freq: Once | TRANSDERMAL | Status: DC
  Filled 2012-04-25: qty 1

## 2012-04-25 MED ORDER — DIPHENHYDRAMINE HCL 25 MG PO CAPS
25.0000 mg | ORAL_CAPSULE | ORAL | Status: DC | PRN
  Filled 2012-04-25: qty 1

## 2012-04-25 MED ORDER — BUPIVACAINE IN DEXTROSE 0.75-8.25 % IT SOLN
INTRATHECAL | Status: DC | PRN
  Administered 2012-04-25: 1.8 mL via INTRATHECAL

## 2012-04-25 MED ORDER — PANTOPRAZOLE SODIUM 40 MG PO TBEC
80.0000 mg | DELAYED_RELEASE_TABLET | Freq: Every day | ORAL | Status: DC
  Filled 2012-04-25 (×2): qty 2

## 2012-04-25 MED ORDER — MENTHOL 3 MG MT LOZG: 1.0000 | LOZENGE | OROMUCOSAL | Status: DC | PRN

## 2012-04-25 SURGICAL SUPPLY — 53 items
AUTOTRANSFUSION W/QD PVC DRAIN (AUTOTRANSFUSION) ×4 IMPLANT
BAG ZIPLOCK 12X15 (MISCELLANEOUS) ×4 IMPLANT
BANDAGE ELASTIC 6 VELCRO ST LF (GAUZE/BANDAGES/DRESSINGS) ×4 IMPLANT
BANDAGE ESMARK 6X9 LF (GAUZE/BANDAGES/DRESSINGS) ×2 IMPLANT
BLADE SAG 18X100X1.27 (BLADE) ×4 IMPLANT
BLADE SAW SGTL 11.0X1.19X90.0M (BLADE) ×4 IMPLANT
BLADE SURG SZ10 CARB STEEL (BLADE) ×4 IMPLANT
BNDG COHESIVE 6X5 TAN STRL LF (GAUZE/BANDAGES/DRESSINGS) ×2 IMPLANT
BNDG ESMARK 6X9 LF (GAUZE/BANDAGES/DRESSINGS) ×4
BOWL SMART MIX CTS (DISPOSABLE) ×4 IMPLANT
CEMENT HV SMART SET (Cement) ×8 IMPLANT
CLOTH BEACON ORANGE TIMEOUT ST (SAFETY) ×2 IMPLANT
CUFF TOURN SGL QUICK 34 (TOURNIQUET CUFF) ×2
CUFF TRNQT CYL 34X4X40X1 (TOURNIQUET CUFF) ×2 IMPLANT
DRAPE EXTREMITY BILATERAL (DRAPE) ×2 IMPLANT
DRAPE INCISE IOBAN 66X45 STRL (DRAPES) ×2 IMPLANT
DRAPE LG THREE QUARTER DISP (DRAPES) ×2 IMPLANT
DRAPE POUCH INSTRU U-SHP 10X18 (DRAPES) ×2 IMPLANT
DRAPE U-SHAPE 47X51 STRL (DRAPES) ×6 IMPLANT
DRSG ADAPTIC 3X8 NADH LF (GAUZE/BANDAGES/DRESSINGS) ×4 IMPLANT
DRSG PAD ABDOMINAL 8X10 ST (GAUZE/BANDAGES/DRESSINGS) ×4 IMPLANT
DURAPREP 26ML APPLICATOR (WOUND CARE) ×4 IMPLANT
ELECT REM PT RETURN 9FT ADLT (ELECTROSURGICAL) ×2
ELECTRODE REM PT RTRN 9FT ADLT (ELECTROSURGICAL) ×1 IMPLANT
FACESHIELD LNG OPTICON STERILE (SAFETY) ×12 IMPLANT
GLOVE BIO SURGEON STRL SZ7.5 (GLOVE) ×4 IMPLANT
GLOVE BIO SURGEON STRL SZ8 (GLOVE) ×4 IMPLANT
GLOVE BIOGEL PI IND STRL 8 (GLOVE) ×2 IMPLANT
GLOVE BIOGEL PI INDICATOR 8 (GLOVE) ×2
GOWN STRL NON-REIN LRG LVL3 (GOWN DISPOSABLE) ×2 IMPLANT
GOWN STRL REIN XL XLG (GOWN DISPOSABLE) ×6 IMPLANT
HANDPIECE INTERPULSE COAX TIP (DISPOSABLE) ×1
IMMOBILIZER KNEE 20 (SOFTGOODS) ×4
IMMOBILIZER KNEE 20 THIGH 36 (SOFTGOODS) ×2 IMPLANT
KIT BASIN OR (CUSTOM PROCEDURE TRAY) ×2 IMPLANT
MANIFOLD NEPTUNE II (INSTRUMENTS) ×2 IMPLANT
NS IRRIG 1000ML POUR BTL (IV SOLUTION) ×2 IMPLANT
PACK TOTAL JOINT (CUSTOM PROCEDURE TRAY) ×2 IMPLANT
PADDING CAST COTTON 6X4 STRL (CAST SUPPLIES) ×12 IMPLANT
SET HNDPC FAN SPRY TIP SCT (DISPOSABLE) ×1 IMPLANT
SPONGE GAUZE 4X4 12PLY (GAUZE/BANDAGES/DRESSINGS) ×4 IMPLANT
SPONGE LAP 18X18 X RAY DECT (DISPOSABLE) ×2 IMPLANT
STOCKINETTE 8 INCH (MISCELLANEOUS) ×2 IMPLANT
STRIP CLOSURE SKIN 1/2X4 (GAUZE/BANDAGES/DRESSINGS) ×8 IMPLANT
SUCTION FRAZIER 12FR DISP (SUCTIONS) ×2 IMPLANT
SUT MNCRL AB 4-0 PS2 18 (SUTURE) ×4 IMPLANT
SUT VIC AB 2-0 CT1 27 (SUTURE) ×6
SUT VIC AB 2-0 CT1 TAPERPNT 27 (SUTURE) ×6 IMPLANT
SUT VLOC 180 0 24IN GS25 (SUTURE) ×4 IMPLANT
TOWEL OR 17X26 10 PK STRL BLUE (TOWEL DISPOSABLE) ×4 IMPLANT
TRAY FOLEY CATH 14FRSI W/METER (CATHETERS) ×2 IMPLANT
WATER STERILE IRR 1500ML POUR (IV SOLUTION) ×4 IMPLANT
WRAP KNEE MAXI GEL POST OP (GAUZE/BANDAGES/DRESSINGS) ×4 IMPLANT

## 2012-04-25 NOTE — H&P (View-Only) (Signed)
Marissa Powers  DOB: 1940-05-16 Married / Language: English / Race: White Female  Date of Admission:  04/25/2012  Chief complaint:  Bilateral Knee Pain  History of Present Illness The patient is a 72 year old female who comes in for a preoperative History and Physical. The patient is scheduled for a bilateral total knee arthroplasty to be performed by Dr. Gus Rankin. Aluisio, MD at [facility] on 04/25/2012. The patient is a 72 year old female who presents with knee complaints. The patient reports left knee (worse than right) symptoms including: pain, instability, catching and stiffness which began year(s) ago.The patient feels that the symptoms are worsening. The patient has the current diagnosis of knee osteoarthritis. Prior to being seen today the patient was previously evaluated in this clinic (by Dr. Charlann Boxer). Previous work-up for this problem has included knee x-rays. Past treatment for this problem has included intra-articular injection of corticosteroids (recent injections have not helped). Current treatment includes nonsteroidal anti-inflammatory drugs (Advil prn). Marissa Powers feels as though the knees are preventing her from doing the things she desires. 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 Because of the knees she is not doing as much as she used to. She would like to improve her activity level but can not do so because of her knees. She is not having any swelling. Pain occurs mainly with activity. She is able to sleep at night. Knees occasionally feel like they want to give out on her. She can not trust what her knees are going to do especially on uneven ground or stairs. She has had cortisone injections in the past with less benefit with each successive injection. She has not had visco supplements in the past.  She is now ready to proceed with bilateral knee replacement procedures. They have been treated conservatively in the past for  the above stated problem and despite conservative measures, they continue to have progressive pain and severe functional limitations and dysfunction. They have failed non-operative management including home exercise, medications, and injections. It is felt that they would benefit from undergoing total joint replacement. Risks and benefits of the procedure have been discussed with the patient and they elect to proceed with surgery. There are no active contraindications to surgery such as ongoing infection or rapidly progressive neurological disease.   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 grandmother mothers side Heart Disease. grandfather mothers side and grandfather fathers side Hypertension. mother   Social History Living situation. live alone Marital status. widowed Number of flights of stairs before winded. 2-3 Drug/Alcohol Rehab (Currently). no Exercise. Exercises weekly; does running / walking and other Illicit drug use. no Pain Contract. no Previously in rehab. no Tobacco / smoke exposure. no Tobacco use. never smoker Current work status. retired Alcohol use. current drinker; drinks wine and hard liquor; 5-7 per week Children. 2 Post-Surgical Plans. Plan is to go to [facility]. Advance Directives. Living Will, Healthcare POA   Medication History Metoprolol Tartrate (25MG  Tablet, Oral) Active. Lisinopril (20MG  Tablet, Oral) Active. NexIUM (40MG  Capsule DR, Oral) Active. Synthroid ( Tablet, Oral) Active. Vitamin D ( Oral) Specific dose unknown - Active. Aspirin EC (81MG  Tablet DR, Oral) Active. Fish Oil Active. Citracal/Vitamin D ( Oral) Specific dose unknown - Active.   Pregnancy / Birth History Pregnant. no   Past Surgical History Tonsillectomy Cataract Surgery. bilateral Colon Polyp  Removal - Colonoscopy Thyroidectomy; Subtotal   Medical  History Lumbar stenosis (724.02) Hyperthyroidism High blood pressure Hypercholesterolemia Gastroesophageal Reflux Disease Menopause   Review of Systems General:Not Present- Chills, Fever, Night Sweats, Fatigue, Weight Gain, Weight Loss and Memory Loss. Skin:Not Present- Hives, Itching, Rash, Eczema and Lesions. HEENT:Not Present- Tinnitus, Headache, Double Vision, Visual Loss, Hearing Loss and Dentures. Respiratory:Not Present- Shortness of breath with exertion, Shortness of breath at rest, Allergies, Coughing up blood and Chronic Cough. Cardiovascular:Not Present- Chest Pain, Racing/skipping heartbeats, Difficulty Breathing Lying Down, Murmur, Swelling and Palpitations. Gastrointestinal:Not Present- Bloody Stool, Heartburn, Abdominal Pain, Vomiting, Nausea, Constipation, Diarrhea, Difficulty Swallowing, Jaundice and Loss of appetitie. Female Genitourinary:Not Present- Blood in Urine, Urinary frequency, Weak urinary stream, Discharge, Flank Pain, Incontinence, Painful Urination, Urgency, Urinary Retention and Urinating at Night. Musculoskeletal:Present- Joint Pain. Not Present- Muscle Weakness, Muscle Pain, Joint Swelling, Back Pain, Morning Stiffness and Spasms. Neurological:Not Present- Tremor, Dizziness, Blackout spells, Paralysis, Difficulty with balance and Weakness. Psychiatric:Not Present- Insomnia.   Vitals Weight: 180 lb Height: 65.5 in Weight was reported by patient. Height was reported by patient. Body Surface Area: 1.94 m Body Mass Index: 29.5 kg/m Pulse: 68 (Regular) Resp.: 12 (Unlabored) BP: 148/82 (Sitting, Left Arm, Standard)    Physical Exam The physical exam findings are as follows:  Note: Patient is a 72 year old female with continued knee pain.   General Mental Status - Alert, cooperative and good historian. General Appearance- pleasant. Not in acute distress. Orientation- Oriented X3. Build & Nutrition- Well nourished and  Well developed.   Head and Neck Head- normocephalic, atraumatic . Neck Global Assessment- supple. no bruit auscultated on the right and no bruit auscultated on the left.   Eye Pupil- Bilateral- Regular and Round. Motion- Bilateral- EOMI.   Chest and Lung Exam Auscultation: Breath sounds:- clear at anterior chest wall and - clear at posterior chest wall. Adventitious sounds:- No Adventitious sounds.   Cardiovascular Auscultation:Rhythm- Regular rate and rhythm. Heart Sounds- S1 WNL and S2 WNL. Murmurs & Other Heart Sounds:Auscultation of the heart reveals - No Murmurs.   Abdomen Palpation/Percussion:Tenderness- Abdomen is non-tender to palpation. Rigidity (guarding)- Abdomen is soft. Auscultation:Auscultation of the abdomen reveals - Bowel sounds normal.   Female Genitourinary Not done, not pertinent to present illness  Musculoskeletal Very pleasant, well developed female alert and oriented in no apparent distress. Evaluation of her hips show normal range of motion with no discomfort. Her left knee shows varus deformity. Range about 5 to 125. She has tenderness over the medial greater than lateral jointline. There is no instability noted about the knee. Right knee less varus. Range 5 to 125. Moderate crepitus on range of motion. Tender medial greater than lateral with no instability noted. Pulses, sensation and motor are intact both lower extremities. She has varus thrust gait pattern on both knees.  RADIOGRAPHS: Radiographs are reviewed, AP both knees and lateral showing bone on bone arthritis in the medial and patellofemoral compartments of both knees. She has varus deformity left worse than right knee.  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 Lequita Halt.  Plan is to go to [facility] following the hospital stay.  PCP - Dr. Theressa Millard - Patient has been seen  preoperatively and felt to be stable for surgery. Cards - Dr. Garnette Scheuermann  Signed electronically by Roberts Gaudy, PA-C

## 2012-04-25 NOTE — Interval H&P Note (Signed)
History and Physical Interval Note:  04/25/2012 9:30 AM  Marissa Powers  has presented today for surgery, with the diagnosis of osteoarthritis bilateral knees  The various methods of treatment have been discussed with the patient and family. After consideration of risks, benefits and other options for treatment, the patient has consented to  Procedure(s) (LRB) with comments: TOTAL KNEE BILATERAL (Bilateral) as a surgical intervention .  The patient's history has been reviewed, patient examined, no change in status, stable for surgery.  I have reviewed the patient's chart and labs.  Questions were answered to the patient's satisfaction.     Loanne Drilling

## 2012-04-25 NOTE — Progress Notes (Signed)
Clinical Social Work Department BRIEF PSYCHOSOCIAL ASSESSMENT 04/25/2012  Patient:  Marissa Powers, Marissa Powers     Account Number:  [id number]     Admit date:  04/25/2012  Clinical Social Worker:  Candie Chroman  Date/Time:  04/25/2012 04:35 PM  Referred by:  Physician  Date Referred:  04/25/2012 Referred for  SNF Placement   Other Referral:   Interview type:  Patient Other interview type:    PSYCHOSOCIAL DATA Living Status:  ALONE Admitted from facility:   Level of care:   Primary support name:  Lajoyce Corners Primary support relationship to patient:  CHILD, ADULT Degree of support available:   supportive    CURRENT CONCERNS Current Concerns  Post-Acute Placement   Other Concerns:    SOCIAL WORK ASSESSMENT / PLAN Pt is a 72 yr old female living at home prior to hospitalization. CSW met with pt / family to assist with d/c planning. Pt has made prior arrangements to have ST Rehab at [facility] following hospital d/c. CSW contacted SNF and d/c plan has been confirmed. CSW will follow to assist with d/c planning to SNF.   Assessment/plan status:  Psychosocial Support/Ongoing Assessment of Needs Other assessment/ plan:   Information/referral to community resources:   None needed at this time.    PATIENT'S/FAMILY'S RESPONSE TO PLAN OF CARE: Pt is planning to have rehab at [facility] when stable for d/c.   Cori Razor LCSW [phone]

## 2012-04-25 NOTE — Anesthesia Postprocedure Evaluation (Signed)
  Anesthesia Post-op Note  Patient: Marissa Powers  Procedure(s) Performed: Procedure(s) (LRB): TOTAL KNEE BILATERAL (Bilateral)  Patient Location: PACU  Anesthesia Type: Epidural  Level of Consciousness: awake and alert   Airway and Oxygen Therapy: Patient Spontanous Breathing  Post-op Pain: mild  Post-op Assessment: Post-op Vital signs reviewed, Patient's Cardiovascular Status Stable, Respiratory Function Stable, Patent Airway and No signs of Nausea or vomiting  Last Vitals:  Filed Vitals:   04/25/12 1345  BP: 150/72  Pulse: 53  Temp: 36.4 C  Resp: 12    Post-op Vital Signs: stable   Complications: No apparent anesthesia complications

## 2012-04-25 NOTE — Anesthesia Preprocedure Evaluation (Addendum)
Anesthesia Evaluation  Patient identified by MRN, date of birth, ID band Patient awake    Reviewed: Allergy & Precautions, H&P , NPO status , Patient's Chart, lab work & pertinent test results  Airway Mallampati: II TM Distance: >3 FB Neck ROM: Full    Dental No notable dental hx.    Pulmonary neg pulmonary ROS,  breath sounds clear to auscultation  Pulmonary exam normal       Cardiovascular hypertension, Pt. on medications Rhythm:Regular Rate:Normal     Neuro/Psych negative neurological ROS  negative psych ROS   GI/Hepatic negative GI ROS, Neg liver ROS,   Endo/Other  Hyperthyroidism   Renal/GU negative Renal ROS  negative genitourinary   Musculoskeletal negative musculoskeletal ROS (+)   Abdominal   Peds negative pediatric ROS (+)  Hematology negative hematology ROS (+)   Anesthesia Other Findings   Reproductive/Obstetrics negative OB ROS                          Anesthesia Physical Anesthesia Plan  ASA: II  Anesthesia Plan: Combined Spinal and Epidural   Post-op Pain Management:    Induction:   Airway Management Planned: Simple Face Mask  Additional Equipment:   Intra-op Plan:   Post-operative Plan:   Informed Consent: I have reviewed the patients History and Physical, chart, labs and discussed the procedure including the risks, benefits and alternatives for the proposed anesthesia with the patient or authorized representative who has indicated his/her understanding and acceptance.     Plan Discussed with: CRNA and Surgeon  Anesthesia Plan Comments:         Anesthesia Quick Evaluation

## 2012-04-25 NOTE — Anesthesia Procedure Notes (Addendum)
Spinal  Patient location during procedure: OR Start time: 04/25/2012 10:40 AM End time: 04/25/2012 10:46 AM Staffing Performed by: anesthesiologist  Preanesthetic Checklist Completed: patient identified, site marked, surgical consent, pre-op evaluation, timeout performed, IV checked, risks and benefits discussed and monitors and equipment checked Spinal Block Patient position: sitting Prep: Betadine Patient monitoring: heart rate, continuous pulse ox and blood pressure Injection technique: single-shot Needle Needle type: Sprotte  Needle gauge: 27 G Needle length: 12.7 cm Additional Notes Expiration date of kit checked and confirmed. Patient tolerated procedure well, without complications.    Epidural Patient location during procedure: OR  Staffing Anesthesiologist: Koray Soter Performed by: anesthesiologist   Preanesthetic Checklist Completed: patient identified, site marked, surgical consent, pre-op evaluation, timeout performed, IV checked, risks and benefits discussed and monitors and equipment checked  Epidural Patient position: sitting Prep: Betadine Patient monitoring: heart rate, continuous pulse ox and blood pressure Injection technique: LOR air  Needle:  Needle type: Tuohy  Needle gauge: 18 G Needle length: 9 cm and 9 Needle insertion depth: 6 cm Catheter type: closed end flexible Catheter size: 20 Guage Catheter at skin depth: 12 cm Test dose: negative and 1.5% lidocaine  Additional Notes Test dose 1.5% Lidocaine with epi 1:200,000  Patient tolerated the insertion well without complications.Reason for block:post-op pain management

## 2012-04-25 NOTE — Progress Notes (Signed)
Pt finished taking her Cipro for UTI

## 2012-04-25 NOTE — Op Note (Signed)
Pre-operative diagnosis- Osteoarthritis  Bilateral knee(s)  Post-operative diagnosis- Osteoarthritis Bilateral knee(s)  Procedure-  Bilateral  Total Knee Arthroplasty  Surgeon- Gus Rankin. [place] Steidle, MD  Assistant- [name], PA-C   Anesthesia-  Spinal and Epidural EBL-* No blood loss amount entered *  Drains Autovac x 2  Tourniquet time-  Total Tourniquet Time Documented: Thigh (Left) - 32 minutes Thigh (Right) - 34 minutes   Complications- None  Condition-PACU - hemodynamically stable.   Brief Clinical Note  Marissa Powers is a 72 y.o. year old female with end stage OA of both knees with progressively worsening pain and dysfunction. She has constant pain, with activity and at rest and significant functional deficits with difficulties even with ADLs. She has had extensive non-op management including analgesics, injections of cortisone and home exercise program, but remains in significant pain with significant dysfunction.Radiographs show bone on bone arthritis medial and patellofemoral compartments both knees. We discussed the pros and cons of doing both at the same time versus one at a time including procedure, risks, potential comps and rehab course associated with each and she elects to proceed with bilateral TKA. She presents now for bilateral Total Knee Arthroplasty.    Procedure in detail---   The patient is brought into the operating room and positioned supine on the operating table. After successful administration of  Spinal and Epidural,   a tourniquet is placed high on the  Bilateral thigh(s) and the lower extremities are prepped and draped in the usual sterile fashion. Time out is performed by the operating team and then the  Left lower extremity is wrapped in Esmarch, knee flexed and the tourniquet inflated to 300 mmHg.       A midline incision is made with a ten blade through the subcutaneous tissue to the level of the extensor mechanism. A fresh blade is used to make a medial  parapatellar arthrotomy. Soft tissue over the proximal medial tibia is subperiosteally elevated to the joint line with a knife and into the semimembranosus bursa with a Cobb elevator. Soft tissue over the proximal lateral tibia is elevated with attention being paid to avoiding the patellar tendon on the tibial tubercle. The patella is everted, knee flexed 90 degrees and the ACL and PCL are removed. Findings are bone on bone medial and patellofemoral with large medial osteophytes.        The drill is used to create a starting hole in the distal femur and the canal is thoroughly irrigated with sterile saline to remove the fatty contents. The 5 degree Left  valgus alignment guide is placed into the femoral canal and the distal femoral cutting block is pinned to remove 11 mm off the distal femur. Resection is made with an oscillating saw.      The tibia is subluxed forward and the menisci are removed. The extramedullary alignment guide is placed referencing proximally at the medial aspect of the tibial tubercle and distally along the second metatarsal axis and tibial crest. The block is pinned to remove 2mm off the more deficient medial  side. Resection is made with an oscillating saw. Size 3is the most appropriate size for the tibia and the proximal tibia is prepared with the modular drill and keel punch for that size.      The femoral sizing guide is placed and size 4 narrow is most appropriate. Rotation is marked off the epicondylar axis and confirmed by creating a rectangular flexion gap at 90 degrees. The size 4 cutting block is  pinned in this rotation and the anterior, posterior and chamfer cuts are made with the oscillating saw. The intercondylar block is then placed and that cut is made.      Trial size 3 tibial component, trial size 4 narrow posterior stabilized femur and a 12.5  mm posterior stabilized rotating platform insert trial is placed. Full extension is achieved with excellent varus/valgus and  anterior/posterior balance throughout full range of motion. The patella is everted and thickness measured to be 22  mm. Free hand resection is taken to 12 mm, a 35 template is placed, lug holes are drilled, trial patella is placed, and it tracks normally. Osteophytes are removed off the posterior femur with the trial in place. All trials are removed and the cut bone surfaces prepared with pulsatile lavage. Cement is mixed and once ready for implantation, the size 3.5 tibial implant, size  4 narrow posterior stabilized femoral component, and the size 35 patella are cemented in place and the patella is held with the clamp. The trial insert is placed and the knee held in full extension.  All extruded cement is removed and once the cement is hard the permanent 12.5 mm posterior stabilized rotating platform insert is placed into the tibial tray.      The wound is copiously irrigated with saline solution and the extensor mechanism closed over a autovac drain with #1 V-loc suture. The tourniquet is released for a total tourniquet time of 32  minutes. Flexion against gravity is 140 degrees and the patella tracks normally. Subcutaneous tissue is closed with 2.0 vicryl and subcuticular with running 4.0 Monocryl. The right knee is then addressed.     The right lower extremity is wrapped in Esmarch, knee flexed and tourniquet inflated to 300 mm Hg. A midline incision is made with a ten blade through the subcutaneous tissue to the level of the extensor mechanism. A fresh blade is used to make a medial parapatellar arthrotomy. Soft tissue over the proximal medial tibia is subperiosteally elevated to the joint line with a knife and into the semimembranosus bursa with a Cobb elevator. Soft tissue over the proximal lateral tibia is elevated with attention being paid to avoiding the patellar tendon on the tibial tubercle. The patella is everted, knee flexed 90 degrees and the ACL and PCL are removed. Findings are bone on bone  medial and patellofemoral with large medial osteophytes.        The drill is used to create a starting hole in the distal femur and the canal is thoroughly irrigated with sterile saline to remove the fatty contents. The 5 degree Right  valgus alignment guide is placed into the femoral canal and the distal femoral cutting block is pinned to remove 11 mm off the distal femur. Resection is made with an oscillating saw.      The tibia is subluxed forward and the menisci are removed. The extramedullary alignment guide is placed referencing proximally at the medial aspect of the tibial tubercle and distally along the second metatarsal axis and tibial crest. The block is pinned to remove 2mm off the more deficient medial  side. Resection is made with an oscillating saw. Size 3is the most appropriate size for the tibia and the proximal tibia is prepared with the modular drill and keel punch for that size.      The femoral sizing guide is placed and size 4 narrow is most appropriate. Rotation is marked off the epicondylar axis and confirmed by creating a rectangular flexion  gap at 90 degrees. The size 4 cutting block is pinned in this rotation and the anterior, posterior and chamfer cuts are made with the oscillating saw. The intercondylar block is then placed and that cut is made.      Trial size 3 tibial component, trial size 4 narrow posterior stabilized femur and a 12.5  mm posterior stabilized rotating platform insert trial is placed. Full extension is achieved with excellent varus/valgus and anterior/posterior balance throughout full range of motion. The patella is everted and thickness measured to be 22  mm. Free hand resection is taken to 12 mm, a 35 template is placed, lug holes are drilled, trial patella is placed, and it tracks normally. Osteophytes are removed off the posterior femur with the trial in place. All trials are removed and the cut bone surfaces prepared with pulsatile lavage. Cement is mixed and  once ready for implantation, the size 3 tibial implant, size  4 narrow posterior stabilized femoral component, and the size 35 patella are cemented in place and the patella is held with the clamp. The trial insert is placed and the knee held in full extension. All extruded cement is removed and once the cement is hard the permanent 12.5 mm posterior stabilized rotating platform insert is placed into the tibial tray.      The wound is copiously irrigated with saline solution and the extensor mechanism closed over a autovac drain with #1 V-loc suture. The tourniquet is released for a total tourniquet time of 34  minutes. Flexion against gravity is 140 degrees and the patella tracks normally. Subcutaneous tissue is closed with 2.0 vicryl and subcuticular with running 4.0 Monocryl. The incisions are cleaned and dried and steri-strips and  bulky sterile dressings are applied. The limbs are placed into knee immobilizers and the patient is awakened and transported to recovery in stable condition.        Please note that a surgical assistant was a medical necessity for this procedure in order to perform it in a safe and expeditious manner. Surgical assistant was necessary to retract the ligaments and vital neurovascular structures to prevent injury to them and also necessary for proper positioning of the limb to allow for anatomic placement of the prosthesis.   Gus Rankin Catheryne Deford, MD    04/25/2012, 12:35 PM

## 2012-04-25 NOTE — Transfer of Care (Signed)
Immediate Anesthesia Transfer of Care Note  Patient: Marissa Powers  Procedure(s) Performed: Procedure(s) (LRB) with comments: TOTAL KNEE BILATERAL (Bilateral)  Patient Location: PACU  Anesthesia Type:Spinal and Epidural  Level of Consciousness: awake, alert  and oriented  Airway & Oxygen Therapy: Patient Spontanous Breathing and Patient connected to face mask oxygen  Post-op Assessment: Report given to PACU RN and Post -op Vital signs reviewed and stable  Post vital signs: Reviewed and stable  Complications: No apparent anesthesia complications

## 2012-04-25 NOTE — Plan of Care (Signed)
Problem: Consults Goal: Diagnosis- Total Joint Replacement Outcome: Completed/Met Date Met:  04/25/12 Bilateral total knees

## 2012-04-26 ENCOUNTER — Encounter (HOSPITAL_COMMUNITY): Payer: Self-pay | Admitting: Orthopedic Surgery

## 2012-04-26 DIAGNOSIS — D62 Acute posthemorrhagic anemia: Secondary | ICD-10-CM | POA: Diagnosis not present

## 2012-04-26 DIAGNOSIS — G8918 Other acute postprocedural pain: Secondary | ICD-10-CM | POA: Diagnosis not present

## 2012-04-26 LAB — BASIC METABOLIC PANEL
BUN: 8 mg/dL (ref 6–23)
CO2: 23 mEq/L (ref 19–32)
Calcium: 8.3 mg/dL — ABNORMAL LOW (ref 8.4–10.5)
Chloride: 105 mEq/L (ref 96–112)
Creatinine, Ser: 0.61 mg/dL (ref 0.50–1.10)
GFR calc Af Amer: >90 mL/min (ref >90)
GFR calc non Af Amer: 90 mL/min — ABNORMAL LOW (ref >90)
Glucose, Bld: 164 mg/dL — ABNORMAL HIGH (ref 70–99)
Potassium: 3.5 mEq/L (ref 3.5–5.1)
Sodium: 136 mEq/L (ref 135–145)

## 2012-04-26 LAB — CBC
HCT: 27.4 % — ABNORMAL LOW (ref 36.0–46.0)
Hemoglobin: 9.2 g/dL — ABNORMAL LOW (ref 12.0–15.0)
MCH: 33 pg (ref 26.0–34.0)
MCHC: 33.6 g/dL (ref 30.0–36.0)
MCV: 98.2 fL (ref 78.0–100.0)
Platelets: 175 10*3/uL (ref 150–400)
RBC: 2.79 MIL/uL — ABNORMAL LOW (ref 3.87–5.11)
RDW: 13.4 % (ref 11.5–15.5)
WBC: 7.8 10*3/uL (ref 4.0–10.5)

## 2012-04-26 MED ORDER — NON FORMULARY: 40.0000 mg | Freq: Every day | Status: DC

## 2012-04-26 MED ORDER — ESOMEPRAZOLE MAGNESIUM 40 MG PO CPDR
40.0000 mg | DELAYED_RELEASE_CAPSULE | Freq: Every day | ORAL | Status: DC
  Administered 2012-04-26 – 2012-04-29 (×4): 40 mg via ORAL
  Filled 2012-04-26 (×5): qty 1

## 2012-04-26 MED ORDER — WARFARIN - PHARMACIST DOSING INPATIENT: Freq: Every day | Status: DC

## 2012-04-26 MED ORDER — WARFARIN SODIUM 2.5 MG PO TABS
2.5000 mg | ORAL_TABLET | Freq: Once | ORAL | Status: AC
  Administered 2012-04-26: 2.5 mg via ORAL
  Filled 2012-04-26: qty 1

## 2012-04-26 MED ORDER — WARFARIN VIDEO: Freq: Once | Status: DC

## 2012-04-26 MED ORDER — PATIENT'S GUIDE TO USING COUMADIN BOOK
Freq: Once | Status: AC
  Administered 2012-04-26: 17:00:00
  Filled 2012-04-26: qty 1

## 2012-04-26 NOTE — Progress Notes (Signed)
POD #1 s/p Bil TKR  Feeling good. Minor discomfort R>L. Satisfied with epidural pain control. No motor block. No pruritis  Afebrile.  Epidural site clean/dry/intact.  A/P: Post-op pain epidural - continue current management. Plan removal tomorrow in AM.  Acey Lav

## 2012-04-26 NOTE — Progress Notes (Signed)
Physical Therapy Treatment Patient Details Name: Marissa Powers MRN: [id number] DOB: [birth year] Today's Date: 04/26/2012 Time: 1478-2956 PT Time Calculation (min): 28 min  PT Assessment / Plan / Recommendation Comments on Treatment Session  Pt continues to have more pain in R knee and less strength/decreased support. Pt. is tolerating well at this point standing and mobilizing.    Follow Up Recommendations  SNF     Does the patient have the potential to tolerate intense rehabilitation     Barriers to Discharge        Equipment Recommendations  None recommended by PT    Recommendations for Other Services    Frequency 7X/week   Plan Discharge plan remains appropriate;Frequency remains appropriate    Precautions / Restrictions Precautions Precautions: Knee Required Braces or Orthoses: Knee Immobilizer - Right;Knee Immobilizer - Left   Pertinent Vitals/Pain R knee 5 L knee ,3  Ice applied to both. RN notified for medication.    Mobility  Bed Mobility Bed Mobility: Sit to Supine Sit to Supine: 3: Mod assist Details for Bed Mobility Assistance: support of both legs, cues to scoot hips back and forth to scoot back onto bed. Transfers Transfers: Sit to Stand Sit to Stand: 1: +2 Total assist;With upper extremity assist;With armrests;From chair/3-in-1 Sit to Stand: Patient Percentage: 50% Stand to Sit: To bed;With upper extremity assist;1: +2 Total assist Stand to Sit: Patient Percentage: 50% Stand Pivot Transfers: 1: +2 Total assist Stand Pivot Transfers: Patient Percentage: 60% Details for Transfer Assistance: lifting assitance from the recliner to a stnding position. Cues to walk legs back to get within RW., Cues to turn with RW and take steps backward, then to walk legs forward as pt reaches to bed to sit down.alk legs forward and reach back to bed as moving to sitting position. Ambulation/Gait Ambulation/Gait Assistance: Not tested (comment)    Exercises Total Joint  Exercises Quad Sets: AROM;Both;10 reps;Supine Heel Slides: AAROM;Both;10 reps;Supine Straight Leg Raises: AAROM;Both;10 reps;Supine   PT Diagnosis:    PT Problem List:   PT Treatment Interventions:     PT Goals Acute Rehab PT Goals Pt will go Sit to Supine/Side: with supervision;with HOB not 0 degrees (comment degree) PT Goal: Sit to Supine/Side - Progress: Progressing toward goal Pt will go Sit to Stand: with min assist;with upper extremity assist PT Goal: Sit to Stand - Progress: Progressing toward goal Pt will go Stand to Sit: with supervision PT Goal: Stand to Sit - Progress: Progressing toward goal Pt will Ambulate: 51 - 150 feet;with min assist;with rolling walker PT Goal: Ambulate - Progress: Progressing toward goal Pt will Perform Home Exercise Program: with min assist PT Goal: Perform Home Exercise Program - Progress: Progressing toward goal  Visit Information  Last PT Received On: 04/26/12 Assistance Needed: +2    Subjective Data  Subjective: If my R knee was as good as my left I would be just great   Cognition  Overall Cognitive Status: Appears within functional limits for tasks assessed/performed    Balance     End of Session PT - End of Session Equipment Utilized During Treatment: Right knee immobilizer;Left knee immobilizer Activity Tolerance: Patient tolerated treatment well Patient left: in bed;with call bell/phone within reach;with family/visitor present Nurse Communication: Mobility status CPM Left Knee CPM Left Knee: On   GP     Rada Hay 04/26/2012, 3:56 PM

## 2012-04-26 NOTE — Progress Notes (Signed)
Clinical Social Work Department CLINICAL SOCIAL WORK PLACEMENT NOTE 04/26/2012  Patient:  Marissa Powers, Marissa Powers  Account Number:  [id number] Admit date:  04/25/2012  Clinical Social Worker:  Cori Razor, LCSW  Date/time:  04/25/2012 04:41 PM  Clinical Social Work is seeking post-discharge placement for this patient at the following level of care:   SKILLED NURSING   (*CSW will update this form in Epic as items are completed)     Patient/family provided with [facility] Department of Clinical Social Work's list of facilities offering this level of care within the geographic area requested by the patient (or if unable, by the patient's family).  04/25/2012  Patient/family informed of their freedom to choose among providers that offer the needed level of care, that participate in Medicare, Medicaid or managed care program needed by the patient, have an available bed and are willing to accept the patient.    Patient/family informed of [facility]' ownership interest in [facility], as well as of the fact that they are under no obligation to receive care at this facility.  PASARR submitted to [employer] on 04/26/2012 PASARR number received from [employer] on   FL2 transmitted to all facilities in geographic area requested by pt/family on  04/26/2012 FL2 transmitted to all facilities within larger geographic area on   Patient informed that his/her managed care company has contracts with or will negotiate with  certain facilities, including the following:     Patient/family informed of bed offers received:  04/26/2012 Patient chooses bed at [facility] Physician recommends and patient chooses bed at    Patient to be transferred to [facility] on   Patient to be transferred to facility by   The following physician request were entered in Epic:   Additional Comments:  Cori Razor LCSW [phone]

## 2012-04-26 NOTE — Progress Notes (Signed)
CARE MANAGEMENT NOTE 04/26/2012  Patient:  Marissa Powers, Marissa Powers   Account Number:  [id number]  Date Initiated:  04/26/2012  Documentation initiated by:  Colleen Can  Subjective/Objective Assessment:   dx Osteoarthritis bilateral knees; bilateral knee replacemnt on day of admission     Action/Plan:   CM spoke with patient. Plans are for SNF rehab at [facility].   Anticipated DC Date:  04/29/2012   Anticipated DC Plan:  SKILLED NURSING FACILITY  In-house referral  Clinical Social Worker      DC Planning Services  CM consult           Status of service:  Completed, signed off

## 2012-04-26 NOTE — Evaluation (Addendum)
Physical Therapy Evaluation Patient Details Name: Marissa Powers MRN: [id number] DOB: 1940/10/24 Today's Date: 04/26/2012 Time: 0928-1000 PT Time Calculation (min): 32 min  PT Assessment / Plan / Recommendation Clinical Impression  Pt. is 72 Y/O female who is s/p Bil. TKA on 04/25/12. Pt was able to stand at [facility] and take a few steps to recliner. R leg is noted to be less supportive in KI and w/ decreased control of taking steps. Pt. plans to DC to SNF rehab. Pt. will benefit from PT while in acute care .     PT Assessment  Patient needs continued PT services    Follow Up Recommendations  SNF    Does the patient have the potential to tolerate intense rehabilitation      Barriers to Discharge        Equipment Recommendations  Rolling walker with 5" wheels    Recommendations for Other Services     Frequency 7X/week    Precautions / Restrictions Precautions Precautions: Knee Precaution Comments: epidural Required Braces or Orthoses: Knee Immobilizer - Right;Knee Immobilizer - Left Knee Immobilizer - Right: Discontinue once straight leg raise with < 10 degree lag Knee Immobilizer - Left: Discontinue once straight leg raise with < 10 degree lag Restrictions Weight Bearing Restrictions: No   Pertinent Vitals/Pain Premedicated, ice applied both. R= 6 /10 L=3 BP sitting upright in bed 122/65, sitting on EOB 11/60/ after transfer 106/65 with some c/o dizziness.     Mobility  Bed Mobility Bed Mobility: Supine to Sit Supine to Sit: 4: Min assist;HOB elevated (max height) Details for Bed Mobility Assistance: cues to shift pelvis to get legs to edge, use of arms to scoot to edge. Transfers Transfers: Sit to Stand;Stand to Sit;Stand Pivot Transfers Sit to Stand: 1: +2 Total assist;From bed;With upper extremity assist;From elevated surface Sit to Stand: Patient Percentage: 50% Stand to Sit: 1: +2 Total assist;With upper extremity assist Stand to Sit: Patient Percentage: 60% Stand  Pivot Transfers: 1: +2 Total assist Stand Pivot Transfers: Patient Percentage: 60% Details for Transfer Assistance: assist to get to upright at RW, cues to walk legs back to recliner, then to walk legs forward and ues of UE's on recliner to lower self. RLE noted to buckle, decrased control, leg tends to abduct due to decreased control. Ambulation/Gait Ambulation/Gait Assistance: Not tested (comment)    Shoulder Instructions     Exercises Total Joint Exercises Ankle Circles/Pumps: AROM;Both;10 reps Quad Sets: AROM;Both;10 reps   PT Diagnosis: Difficulty walking;Acute pain  PT Problem List: Decreased strength;Decreased range of motion;Decreased activity tolerance;Decreased mobility;Decreased knowledge of use of DME;Decreased safety awareness;Pain;Decreased knowledge of precautions PT Treatment Interventions: DME instruction;Gait training;Functional mobility training;Therapeutic activities;Patient/family education   PT Goals Acute Rehab PT Goals PT Goal Formulation: With patient Time For Goal Achievement: 05/03/12 Potential to Achieve Goals: Good Pt will go Supine/Side to Sit: with supervision;with HOB 0 degrees PT Goal: Supine/Side to Sit - Progress: Goal set today Pt will go Sit to Supine/Side: with supervision;with HOB 0 degrees PT Goal: Sit to Supine/Side - Progress: Goal set today Pt will go Sit to Stand: with min assist;with upper extremity assist PT Goal: Sit to Stand - Progress: Goal set today Pt will go Stand to Sit: with supervision;with upper extremity assist PT Goal: Stand to Sit - Progress: Goal set today Pt will Ambulate: 51 - 150 feet;with min assist;with rolling walker PT Goal: Ambulate - Progress: Goal set today Pt will Perform Home Exercise Program: with min assist PT Goal: Perform  Home Exercise Program - Progress: Goal set today  Visit Information  Last PT Received On: 04/26/12 Assistance Needed: +2    Subjective Data  Subjective: The R knee is bugging me. the R  one feels differently Patient Stated Goal: To get up, walk.   Prior Functioning  Home Living Lives With: Alone Type of Home: Skilled Nursing Facility Home Adaptive Equipment: None Prior Function Level of Independence: Independent Able to Take Stairs?: Yes Driving: Yes Vocation: Retired Musician: No difficulties    Cognition  Overall Cognitive Status: Appears within functional limits for tasks assessed/performed Arousal/Alertness: Awake/alert Orientation Level: Appears intact for tasks assessed Behavior During Session: [facility] for tasks performed    Extremity/Trunk Assessment Right Lower Extremity Assessment RLE ROM/Strength/Tone: Deficits RLE ROM/Strength/Tone Deficits: SLR with min asssit. RLE Sensation: Deficits RLE Sensation Deficits: R leg is slightly numb Left Lower Extremity Assessment LLE ROM/Strength/Tone: Deficits;Within functional levels LLE Sensation: WFL - Light Touch   Balance Static Sitting Balance Static Sitting - Balance Support: Bilateral upper extremity supported;Feet supported Static Sitting - Level of Assistance: 5: Stand by assistance  End of Session PT - End of Session Equipment Utilized During Treatment: Right knee immobilizer;Left knee immobilizer Activity Tolerance: Patient tolerated treatment well;Patient limited by pain Patient left: in chair;with call bell/phone within reach;with family/visitor present Nurse Communication: Mobility status CPM Left Knee CPM Left Knee: Off CPM Right Knee CPM Right Knee: Off  GP     Rada Hay 04/26/2012, 10:19 AM  [phone]

## 2012-04-26 NOTE — Evaluation (Signed)
Occupational Therapy Evaluation Patient Details Name: Marissa Powers MRN: [id number] DOB: 1940/05/03 Today's Date: 04/26/2012 Time: 0926-1000 OT Time Calculation (min): 34 min  OT Assessment / Plan / Recommendation Clinical Impression  Pt doing well POD 1 BTKRs. Pt did c/o mild dizziness upon transfer to the chair. Vitals stable on 2.5 L of O2. Skilled OT recommended to maximize independence with BADLs to min-mod A level in prep for d.c to next venue of care.    OT Assessment  Patient needs continued OT Services    Follow Up Recommendations  SNF;Supervision/Assistance - 24 hour    Barriers to Discharge Decreased caregiver support    Equipment Recommendations  3 in 1 bedside comode    Recommendations for Other Services    Frequency  Min 2X/week    Precautions / Restrictions Precautions Precautions: Knee Precaution Comments: epidural Required Braces or Orthoses: Knee Immobilizer - Right;Knee Immobilizer - Left Knee Immobilizer - Right: Discontinue once straight leg raise with < 10 degree lag Knee Immobilizer - Left: Discontinue once straight leg raise with < 10 degree lag Restrictions Weight Bearing Restrictions: No   Pertinent Vitals/Pain Pt reported ~3/10 pain in B knees following transfer to chair. Repositioned and cold applied.    ADL  Grooming: Set up Where Assessed - Grooming: Unsupported sitting Upper Body Bathing: Set up Where Assessed - Upper Body Bathing: Unsupported sitting Lower Body Bathing: +2 Total assistance Lower Body Bathing: Patient Percentage: 40% Where Assessed - Lower Body Bathing: Supported sit to stand Upper Body Dressing: Set up Where Assessed - Upper Body Dressing: Unsupported sitting Lower Body Dressing: +2 Total assistance Lower Body Dressing: Patient Percentage: 30% Where Assessed - Lower Body Dressing: Supported sit to stand Toilet Transfer: +2 Total assistance Toilet Transfer: Patient Percentage: 50% Toilet Transfer Method: Stand  pivot Acupuncturist: Other (comment) (to recliner.) Toileting - Clothing Manipulation and Hygiene: +2 Total assistance Toileting - Clothing Manipulation and Hygiene: Patient Percentage: 50% Where Assessed - Glass blower/designer Manipulation and Hygiene: Standing Equipment Used: Rolling walker Transfers/Ambulation Related to ADLs: Pt completed SPT from bed>chair. Mod cues for LE management, hand placement and technique.    OT Diagnosis: Generalized weakness  OT Problem List: Decreased activity tolerance;Decreased knowledge of use of DME or AE OT Treatment Interventions: Self-care/ADL training;Therapeutic activities;DME and/or AE instruction;Patient/family education   OT Goals Acute Rehab OT Goals OT Goal Formulation: With patient Time For Goal Achievement: 05/03/12 Potential to Achieve Goals: Good ADL Goals Pt Will Perform Grooming: Standing at sink;Supported;Other (comment) (with minguard A) ADL Goal: Grooming - Progress: Goal set today Pt Will Perform Lower Body Bathing: with min assist;Sit to stand from chair;Sit to stand from bed ADL Goal: Lower Body Bathing - Progress: Goal set today Pt Will Perform Lower Body Dressing: with mod assist;Sit to stand from chair;Sit to stand from bed ADL Goal: Lower Body Dressing - Progress: Goal set today Pt Will Transfer to Toilet: with min assist;Ambulation;with DME;Stand pivot transfer ADL Goal: Toilet Transfer - Progress: Goal set today Pt Will Perform Toileting - Clothing Manipulation: with min assist;Sitting on 3-in-1 or toilet;Standing ADL Goal: Toileting - Clothing Manipulation - Progress: Goal set today Pt Will Perform Toileting - Hygiene: with min assist;Sit to stand from 3-in-1/toilet ADL Goal: Toileting - Hygiene - Progress: Goal set today  Visit Information  Last OT Received On: 04/26/12 Assistance Needed: +2 PT/OT Co-Evaluation/Treatment: Yes    Subjective Data  Patient Stated Goal: Go to [facility] for st rehab.   Prior  Functioning  Home Living Lives With: Alone Type of Home: Skilled Nursing Facility Home Adaptive Equipment: None Prior Function Level of Independence: Independent Able to Take Stairs?: Yes Driving: Yes Vocation: Retired Musician: No difficulties         Vision/Perception     Cognition  Overall Cognitive Status: Appears within functional limits for tasks assessed/performed Arousal/Alertness: Awake/alert Orientation Level: Appears intact for tasks assessed Behavior During Session: [facility] for tasks performed    Extremity/Trunk Assessment Right Upper Extremity Assessment RUE ROM/Strength/Tone: [facility] for tasks assessed Left Upper Extremity Assessment LUE ROM/Strength/Tone: WFL for tasks assessed Right Lower Extremity Assessment RLE ROM/Strength/Tone: Deficits RLE ROM/Strength/Tone Deficits: SLR with min asssit. RLE Sensation: Deficits RLE Sensation Deficits: R leg is slightly numb Left Lower Extremity Assessment LLE ROM/Strength/Tone: Deficits;Within functional levels LLE Sensation: WFL - Light Touch     Mobility Bed Mobility Bed Mobility: Supine to Sit Supine to Sit: 4: Min assist;HOB elevated (max height) Details for Bed Mobility Assistance: cues to shift pelvis to get legs to edge, use of arms to scoot to edge. Transfers Sit to Stand: 1: +2 Total assist;From bed;With upper extremity assist;From elevated surface Sit to Stand: Patient Percentage: 50% Stand to Sit: 1: +2 Total assist;With upper extremity assist Stand to Sit: Patient Percentage: 60% Details for Transfer Assistance: assist to get to upright at RW, cues to walk legs back to recliner, then to walk legs forward and ues of UE's on recliner to lower self. RLE noted to buckle, decrased control, leg tends to abduct due to decreased control.     Shoulder Instructions     Exercise    Balance Static Sitting Balance Static Sitting - Balance Support: Bilateral upper extremity supported;Feet  supported Static Sitting - Level of Assistance: 5: Stand by assistance   End of Session OT - End of Session Activity Tolerance: Patient tolerated treatment well Patient left: in chair;with call bell/phone within reach CPM Left Knee CPM Left Knee: Off CPM Right Knee CPM Right Knee: Off  GO     Ouida Abeyta A OTR/L [id number] 04/26/2012, 10:41 AM

## 2012-04-26 NOTE — Progress Notes (Signed)
   Subjective: 1 Day Post-Op Procedure(s) (LRB): TOTAL KNEE BILATERAL (Bilateral) Patient reports pain as controlled with the epidural. Patient seen in rounds with Dr. Lequita Halt. Patient is well, and has had no acute complaints or problems We will start therapy today.  Plan is to go Skilled nursing facility after hospital stay.  Objective: Vital signs in last 24 hours: Temp:  [97.6 F (36.4 C)-99.1 F (37.3 C)] 98.7 F (37.1 C) (01/14 0503) Pulse Rate:  [53-81] 67  (01/14 0503) Resp:  [12-27] 20  (01/14 0503) BP: (100-163)/(60-84) 101/60 mmHg (01/14 0503) SpO2:  [96 %-100 %] 97 % (01/14 0503) FiO2 (%):  [98 %] 98 % (01/13 1425) Weight:  [82.101 kg (181 lb)] 82.101 kg (181 lb) (01/13 1425)  Intake/Output from previous day:  Intake/Output Summary (Last 24 hours) at 04/26/12 0839 Last data filed at 04/26/12 0749  Gross per 24 hour  Intake 4913.75 ml  Output   2950 ml  Net 1963.75 ml    Intake/Output this shift: UOP 600 since MN +1963 Total I/O In: 153.8 [I.V.:153.8] Out: 0   Labs:  [facility] 04/26/12 0440  HGB 9.2*    Basename 04/26/12 0440  WBC 7.8  RBC 2.79*  HCT 27.4*  PLT 175    Basename 04/26/12 0440  NA 136  K 3.5  CL 105  CO2 23  BUN 8  CREATININE 0.61  GLUCOSE 164*  CALCIUM 8.3*   No results found for this basename: LABPT:2,INR:2 in the last 72 hours  EXAM General - Patient is Alert, Appropriate and Oriented Extremity - Neurovascular intact Sensation intact distally Dressing - dressing C/D/I to both knees Motor Function - intact, moving feet and toes well on exam.  Both Hemovacs pulled without difficulty.  Past Medical History  Diagnosis Date  . Arthritis     osteoarthritis  . Hyperthyroidism   . Hypertension   . Hypercholesteremia   . GERD (gastroesophageal reflux disease)     Assessment/Plan: 1 Day Post-Op Procedure(s) (LRB): TOTAL KNEE BILATERAL (Bilateral) Principal Problem:  *OA (osteoarthritis) of knee Active Problems:  Postop Acute blood loss anemia  Estimated Body mass index is 30.12 kg/(m^2) as calculated from the following:   Height as of this encounter: 5\' 5" (1.651 m).   Weight as of this encounter: 181 lb(82.101 kg). Advance diet Up with therapy Discharge to SNF - Wants to look into [facility]  DVT Prophylaxis - Lovenox and Coumadin, Lovenox will not start until tomorrow afternoon following removal of the epidural. First dose of Coumadin this evening. Weight-Bearing as tolerated to both leg  No vaccines.  Continue O2 and Pulse OX  Continue foley for now.  Will keep foley until tomorrow and will not be removed until at least 6-8 hours following the removal of the epidural catheter.  Take Coumadin for four weeks and then discontinue.  The dose may need to be adjusted based upon the INR.  Please follow the INR and titrate Coumadin dose for a therapeutic range between 2.0 and 3.0 INR.  After completing the four weeks of Coumadin, the patient may stop the Coumadin and resume their 81 mg Aspirin daily.  Lovenox injections will start tomorrow evening after the epidural has been removed and continue until the INR is therapeutic at or greater than 2.0.  When INR reaches the therapeutic level of equal to or greater than 2.0, the patient may discontinue the Lovenox injections.   Patrica Duel 04/26/2012, 8:39 AM

## 2012-04-26 NOTE — Progress Notes (Signed)
ANTICOAGULATION CONSULT NOTE - Initial Consult  Pharmacy Consult for Coumadin Indication: VTE ppx following bilateral TKAs.  Allergies  Allergen Reactions  . Cashew Nut Oil Other (See Comments)    Tingley, swelling   Patient Measurements: Height: 5\' 5"  (165.1 cm) Weight: 181 lb (82.101 kg) IBW/kg (Calculated) : 57   Vital Signs: Temp: 98.7 F (37.1 C) (01/14 0503) BP: 101/60 mmHg (01/14 0503) Pulse Rate: 67  (01/14 0503)  Labs:  Basename 04/26/12 0440  HGB 9.2*  HCT 27.4*  PLT 175  APTT --  LABPROT --  INR --  HEPARINUNFRC --  CREATININE 0.61  CKTOTAL --  CKMB --  TROPONINI --    Estimated Creatinine Clearance: 69.2 ml/min (by C-G formula based on Cr of 0.61).   Medical History: Past Medical History  Diagnosis Date  . Arthritis     osteoarthritis  . Hyperthyroidism   . Hypertension   . Hypercholesteremia   . GERD (gastroesophageal reflux disease)    Medications:  Prescriptions prior to admission  Medication Sig Dispense Refill  . aspirin EC 81 MG tablet Take 81 mg by mouth every morning.      . Calcium Citrate (CITRACAL PO) Take 1 tablet by mouth daily.      . metoprolol succinate (TOPROL-XL) 25 MG 24 hr tablet Take 25 mg by mouth at bedtime.      . Omega-3 Fatty Acids (FISH OIL) 1200 MG CAPS Take 1,200 mg by mouth daily.      . Vitamin D, Ergocalciferol, (DRISDOL) 50000 UNITS CAPS Take 50,000 Units by mouth every 14 (fourteen) days. Every other week      . zolpidem (AMBIEN) 5 MG tablet Take 5 mg by mouth at bedtime as needed.      . ciprofloxacin (CIPRO) 500 MG tablet Take 500 mg by mouth 2 (two) times daily.      [name] esomeprazole (NEXIUM) 40 MG capsule Take 40 mg by mouth every morning.      [name] levothyroxine (SYNTHROID, LEVOTHROID) 112 MCG tablet Take 112 mcg by mouth every morning.      [name] lisinopril (PRINIVIL,ZESTRIL) 20 MG tablet Take 20 mg by mouth every morning.      . simvastatin (ZOCOR) 40 MG tablet Take 40 mg by mouth at bedtime.       Scheduled:     . [COMPLETED] acetaminophen  1,000 mg Intravenous Once  . acetaminophen  1,000 mg Intravenous Q6H  . [COMPLETED]  ceFAZolin (ANCEF) IV  1 g Intravenous Q6H  . [COMPLETED]  ceFAZolin (ANCEF) IV  2 g Intravenous 60 min Pre-Op  . [COMPLETED] dexamethasone  10 mg Oral Once   Or  . [COMPLETED] dexamethasone  10 mg Intravenous Once  . docusate sodium  100 mg Oral BID  . esomeprazole  40 mg Oral Q1200  . levothyroxine  112 mcg Oral q morning - 10a  . metoprolol succinate  25 mg Oral QHS  . scopolamine  1 patch Transdermal Once  . simvastatin  40 mg Oral QHS  . [COMPLETED] tranexamic acid (CYLOKAPRON) IVPB (ORTHO)  1,230 mg Intravenous 30 min Pre-Op  . [DISCONTINUED]  ceFAZolin (ANCEF) IV  3 g Intravenous 60 min Pre-Op  . [DISCONTINUED] NON FORMULARY 40 mg  40 mg Oral Daily  . [DISCONTINUED] pantoprazole  80 mg Oral Q1200  . [DISCONTINUED] rivaroxaban  10 mg Oral Q breakfast    Assessment:  72yo F s/p bilateral TKAs on 1/13. Has an epidural for pain control.  Xarelto originally ordered post-op but  no doses were given.  Switching to Coumadin to start today POD#1. Baseline INR 1.01 on 04/07/12.  Lovenox bridging to start POD#2.  Goal of Therapy:  INR 2-3 Monitor platelets by anticoagulation protocol: Yes   Plan:   Start with conservative Coumadin 2.5mg  today given epidural.  F/u daily.  Provide Coumadin education.  Charolotte Eke, PharmD, pager [phone]. 04/26/2012,9:01 AM.

## 2012-04-26 NOTE — Addendum Note (Signed)
Addendum  created 04/26/12 0925 by Phillips Grout, MD   Modules edited:Inpatient Notes

## 2012-04-27 DIAGNOSIS — G8918 Other acute postprocedural pain: Secondary | ICD-10-CM | POA: Diagnosis not present

## 2012-04-27 LAB — BASIC METABOLIC PANEL
BUN: 6 mg/dL (ref 6–23)
CO2: 23 mEq/L (ref 19–32)
Calcium: 8.8 mg/dL (ref 8.4–10.5)
Chloride: 104 mEq/L (ref 96–112)
Creatinine, Ser: 0.56 mg/dL (ref 0.50–1.10)
GFR calc Af Amer: >90 mL/min (ref >90)
GFR calc non Af Amer: >90 mL/min (ref >90)
Glucose, Bld: 137 mg/dL — ABNORMAL HIGH (ref 70–99)
Potassium: 3.8 mEq/L (ref 3.5–5.1)
Sodium: 136 mEq/L (ref 135–145)

## 2012-04-27 LAB — CBC
HCT: 27.9 % — ABNORMAL LOW (ref 36.0–46.0)
Hemoglobin: 9.5 g/dL — ABNORMAL LOW (ref 12.0–15.0)
MCH: 33.3 pg (ref 26.0–34.0)
MCHC: 34.1 g/dL (ref 30.0–36.0)
MCV: 97.9 fL (ref 78.0–100.0)
Platelets: 189 10*3/uL (ref 150–400)
RBC: 2.85 MIL/uL — ABNORMAL LOW (ref 3.87–5.11)
RDW: 13.3 % (ref 11.5–15.5)
WBC: 12.2 10*3/uL — ABNORMAL HIGH (ref 4.0–10.5)

## 2012-04-27 LAB — PROTIME-INR
INR: 1.25 (ref 0.00–1.49)
Prothrombin Time: 15.5 seconds — ABNORMAL HIGH (ref 11.6–15.2)

## 2012-04-27 MED ORDER — ENOXAPARIN SODIUM 30 MG/0.3ML ~~LOC~~ SOLN
30.0000 mg | Freq: Two times a day (BID) | SUBCUTANEOUS | Status: DC
  Administered 2012-04-28 – 2012-04-29 (×4): 30 mg via SUBCUTANEOUS
  Filled 2012-04-27 (×5): qty 0.3

## 2012-04-27 MED ORDER — WARFARIN SODIUM 2.5 MG PO TABS
2.5000 mg | ORAL_TABLET | Freq: Once | ORAL | Status: AC
Start: 2012-04-27 — End: 2012-04-27
  Administered 2012-04-27: 2.5 mg via ORAL
  Filled 2012-04-27: qty 1

## 2012-04-27 MED ORDER — HYDROCODONE-ACETAMINOPHEN 7.5-325 MG PO TABS
1.0000 | ORAL_TABLET | ORAL | Status: DC | PRN
  Administered 2012-04-27: 1 via ORAL
  Administered 2012-04-28: 2 via ORAL
  Administered 2012-04-28: 1 via ORAL
  Administered 2012-04-28 (×2): 2 via ORAL
  Administered 2012-04-29 (×3): 1 via ORAL
  Filled 2012-04-27: qty 1
  Filled 2012-04-27: qty 2
  Filled 2012-04-27 (×4): qty 1
  Filled 2012-04-27 (×2): qty 2

## 2012-04-27 NOTE — Progress Notes (Signed)
Epidural catheter discontinued and removed intact. No complications noted. Sterile dressing applied to insertion site.

## 2012-04-27 NOTE — Progress Notes (Signed)
Clinical Social Work Department CLINICAL SOCIAL WORK PLACEMENT NOTE 04/27/2012  Patient:  Marissa Powers, Marissa Powers  Account Number:  [id number] Admit date:  04/25/2012  Clinical Social Worker:  Cori Razor, LCSW  Date/time:  04/25/2012 04:41 PM  Clinical Social Work is seeking post-discharge placement for this patient at the following level of care:   SKILLED NURSING   (*CSW will update this form in Epic as items are completed)     Patient/family provided with [facility] Department of Clinical Social Work's list of facilities offering this level of care within the geographic area requested by the patient (or if unable, by the patient's family).  04/25/2012  Patient/family informed of their freedom to choose among providers that offer the needed level of care, that participate in Medicare, Medicaid or managed care program needed by the patient, have an available bed and are willing to accept the patient.    Patient/family informed of [facility]' ownership interest in [facility], as well as of the fact that they are under no obligation to receive care at this facility.  PASARR submitted to EDS on 04/26/2012 PASARR number received from EDS on   FL2 transmitted to all facilities in geographic area requested by pt/family on  04/26/2012 FL2 transmitted to all facilities within larger geographic area on   Patient informed that his/her managed care company has contracts with or will negotiate with  certain facilities, including the following:     Patient/family informed of bed offers received:  04/26/2012 Patient chooses bed at [facility] Physician recommends and patient chooses bed at    Patient to be transferred to [facility] on   Patient to be transferred to facility by   The following physician request were entered in Epic:   Additional Comments:  Cori Razor LCSW [phone]

## 2012-04-27 NOTE — Progress Notes (Signed)
Occupational Therapy Treatment Patient Details Name: Marissa Powers MRN: [id number] DOB: 1941/03/15 Today's Date: 04/27/2012 Time: 0865-7846 OT Time Calculation (min): 25 min  OT Assessment / Plan / Recommendation Comments on Treatment Session Pt continuing to progress with mobility and ADLs. No complaints.    Follow Up Recommendations  SNF;Supervision/Assistance - 24 hour    Barriers to Discharge       Equipment Recommendations  3 in 1 bedside comode    Recommendations for Other Services    Frequency Min 2X/week   Plan Discharge plan remains appropriate    Precautions / Restrictions Precautions Precautions: Knee Precaution Comments: epidural  Required Braces or Orthoses: Knee Immobilizer - Right;Knee Immobilizer - Left Knee Immobilizer - Right: Discontinue once straight leg raise with < 10 degree lag Knee Immobilizer - Left: Discontinue once straight leg raise with < 10 degree lag Restrictions Weight Bearing Restrictions: No   Pertinent Vitals/Pain Pt denied pain.    ADL  Grooming: Set up;Wash/dry face;Teeth care;Brushing hair Where Assessed - Grooming: Supported sitting Toilet Transfer: +2 Total assistance Toilet Transfer: Patient Percentage: 60% Toilet Transfer Method: Sit to Barista: Other (comment) (recliner) Transfers/Ambulation Related to ADLs: Pt was able to ambulate ~20 feet with +2 A. and cues for technique.    OT Diagnosis:    OT Problem List:   OT Treatment Interventions:     OT Goals ADL Goals ADL Goal: Grooming - Progress: Progressing toward goals ADL Goal: Toilet Transfer - Progress: Progressing toward goals  Visit Information  Last OT Received On: 04/27/12 Assistance Needed: +2 PT/OT Co-Evaluation/Treatment: Yes    Subjective Data  Subjective: I'm actually not hurting at all.   Prior Functioning       Cognition  Overall Cognitive Status: Appears within functional limits for tasks  assessed/performed Arousal/Alertness: Awake/alert Orientation Level: Appears intact for tasks assessed Behavior During Session: [facility] for tasks performed    Mobility  Shoulder Instructions Bed Mobility Supine to Sit: 3: Mod assist;HOB elevated;With rails Details for Bed Mobility Assistance: Pt required A for BLEs. Min cues for hand placement and technique. Transfers Sit to Stand: 1: +2 Total assist;From bed;With upper extremity assist Sit to Stand: Patient Percentage: 60% Stand to Sit: 1: +2 Total assist;To chair/3-in-1;With upper extremity assist;With armrests Stand to Sit: Patient Percentage: 60% Details for Transfer Assistance: Min cues for hand placement and BLE management during both sitting and standing.       Exercises      Balance     End of Session OT - End of Session Activity Tolerance: Patient tolerated treatment well Patient left: in chair;with call bell/phone within reach CPM Left Knee CPM Left Knee: Off CPM Right Knee CPM Right Knee: Off  GO     Marissa Powers A OTR/L [id number] 04/27/2012, 10:49 AM

## 2012-04-27 NOTE — Progress Notes (Signed)
Physical Therapy Treatment Patient Details Name: Marissa Powers MRN: [id number] DOB: 07/11/1940 Today's Date: 04/27/2012 Time: 1000-1045 PT Time Calculation (min): 45 min  PT Assessment / Plan / Recommendation Comments on Treatment Session  Pt. with improved mobility, ambulated in hall x 20 ft, and has decreased pain R knee, still w/ epidural. continue PT    Follow Up Recommendations  SNF     Does the patient have the potential to tolerate intense rehabilitation     Barriers to Discharge        Equipment Recommendations  None recommended by PT    Recommendations for Other Services    Frequency 7X/week   Plan Discharge plan remains appropriate;Frequency remains appropriate    Precautions / Restrictions Precautions Precautions: Knee Precaution Comments: epidural  Required Braces or Orthoses: Knee Immobilizer - Right;Knee Immobilizer - Left Knee Immobilizer - Right: Discontinue once straight leg raise with < 10 degree lag Knee Immobilizer - Left: Discontinue once straight leg raise with < 10 degree lag Restrictions Weight Bearing Restrictions: No   Pertinent Vitals/Pain No pain either knee.    Mobility  Bed Mobility Bed Mobility: Supine to Sit Supine to Sit: 3: Mod assist;HOB elevated;With rails Details for Bed Mobility Assistance: Pt required A for BLEs. Min cues for hand placement and technique Transfers Sit to Stand: 1: +2 Total assist;From bed;With upper extremity assist Sit to Stand: Patient Percentage: 60% Stand to Sit: 1: +2 Total assist;To chair/3-in-1;With upper extremity assist;With armrests Stand to Sit: Patient Percentage: 60% Details for Transfer Assistance: Min cues for hand placement and BLE management during both sitting and standing. Ambulation/Gait Ambulation/Gait Assistance: 1: +2 Total assist Ambulation/Gait: Patient Percentage: 70% Ambulation Distance (Feet): 20 Feet Assistive device: Rolling walker Ambulation/Gait Assistance Details: bil. KI used  today, pt has improved cotrol of R leg today, not buckling. Pt tolerated well. Gait Pattern: Step-to pattern;Trunk flexed;Decreased stride length Gait velocity: decreased    Exercises Total Joint Exercises Ankle Circles/Pumps: AROM;Both;Supine Quad Sets: AROM;Both;10 reps;Supine Heel Slides: AAROM;Both;10 reps;Supine Hip ABduction/ADduction: AAROM;Both;10 reps;Supine Straight Leg Raises: AAROM;Both;10 reps;Supine   PT Diagnosis:    PT Problem List:   PT Treatment Interventions:     PT Goals Acute Rehab PT Goals Pt will go Supine/Side to Sit: with supervision;with HOB 0 degrees PT Goal: Supine/Side to Sit - Progress: Progressing toward goal Pt will go Sit to Stand: with min assist;with upper extremity assist PT Goal: Sit to Stand - Progress: Progressing toward goal Pt will go Stand to Sit: with supervision PT Goal: Stand to Sit - Progress: Progressing toward goal Pt will Ambulate: 51 - 150 feet;with supervision PT Goal: Ambulate - Progress: Progressing toward goal Pt will Perform Home Exercise Program: with min assist PT Goal: Perform Home Exercise Program - Progress: Progressing toward goal  Visit Information  Last PT Received On: 04/27/12 Assistance Needed: +2    Subjective Data  Subjective: I am knocked out.   Cognition  Overall Cognitive Status: Appears within functional limits for tasks assessed/performed Arousal/Alertness: Awake/alert Orientation Level: Appears intact for tasks assessed Behavior During Session: [name] for tasks performed    Balance     End of Session PT - End of Session Equipment Utilized During Treatment: Right knee immobilizer;Left knee immobilizer Activity Tolerance: Patient tolerated treatment well Patient left: in chair;with call bell/phone within reach;with family/visitor present Nurse Communication: Mobility status CPM Left Knee CPM Left Knee: Off CPM Right Knee CPM Right Knee: Off   GP     Rada Hay 04/27/2012, 11:54  AM

## 2012-04-27 NOTE — Progress Notes (Signed)
Physical Therapy Treatment Patient Details Name: Marissa Powers MRN: [id number] DOB: 02-02-1941 Today's Date: 04/27/2012 Time: 2952-8413 PT Time Calculation (min): 25 min  PT Assessment / Plan / Recommendation Comments on Treatment Session  Pt. is experiencing more pain in L now that epidural is out. Pt. did push through pain this pm. continue PT.    Follow Up Recommendations  SNF     Does the patient have the potential to tolerate intense rehabilitation     Barriers to Discharge        Equipment Recommendations  None recommended by PT    Recommendations for Other Services    Frequency 7X/week   Plan Discharge plan remains appropriate;Frequency remains appropriate    Precautions / Restrictions Precautions Precautions: Knee Required Braces or Orthoses: Knee Immobilizer - Right;Knee Immobilizer - Left Knee Immobilizer - Right: Discontinue once straight leg raise with < 10 degree lag Knee Immobilizer - Left: Discontinue once straight leg raise with < 10 degree lag   Pertinent Vitals/Pain L 6/1R 5/10 RN notified. Ice applied.    Mobility  Bed Mobility Sit to Supine: 3: Mod assist Details for Bed Mobility Assistance: assitance for legs onto bed. Transfers Sit to Stand: 1: +2 Total assist;From bed;With upper extremity assist;With armrests Sit to Stand: Patient Percentage: 60% Stand to Sit: To bed;To elevated surface;1: +2 Total assist Stand to Sit: Patient Percentage: 60% Details for Transfer Assistance: cues for pushing self up from recliner then walking legs backward. Ambulation/Gait Ambulation/Gait Assistance: 1: +2 Total assist Ambulation/Gait: Patient Percentage: 60% Ambulation Distance (Feet): 20 Feet Assistive device: Rolling walker Ambulation/Gait Assistance Details: Bil. KI's used, pt in more pain since epidural dc'd. Pt did push herself to walk. Gait Pattern: Step-to pattern;Decreased step length - right;Decreased step length - left;Antalgic    Exercises      PT Diagnosis:    PT Problem List:   PT Treatment Interventions:     PT Goals Acute Rehab PT Goals Pt will go Sit to Supine/Side: with supervision;with HOB not 0 degrees (comment degree) PT Goal: Sit to Supine/Side - Progress: Progressing toward goal Pt will go Sit to Stand: with min assist;with upper extremity assist PT Goal: Sit to Stand - Progress: Progressing toward goal Pt will go Stand to Sit: with supervision;with upper extremity assist PT Goal: Stand to Sit - Progress: Progressing toward goal Pt will Ambulate: 51 - 150 feet;with supervision PT Goal: Ambulate - Progress: Progressing toward goal  Visit Information  Last PT Received On: 04/27/12 Assistance Needed: +2    Subjective Data  Subjective: I can feel more pain in my Left knee. (epidural is out)   Cognition  Overall Cognitive Status: Appears within functional limits for tasks assessed/performed Arousal/Alertness: Awake/alert Behavior During Session: [facility] for tasks performed Cognition - Other Comments: appears in more pain    Balance     End of Session PT - End of Session Activity Tolerance: Patient limited by pain Patient left: in bed;with call bell/phone within reach;with family/visitor present Nurse Communication: Mobility status;Patient requests pain meds CPM Left Knee CPM Left Knee: Off CPM Right Knee CPM Right Knee: Off   GP     Rada Hay 04/27/2012, 5:06 PM

## 2012-04-27 NOTE — Progress Notes (Signed)
ANTICOAGULATION CONSULT NOTE - Initial Consult  Pharmacy Consult for Coumadin Indication: VTE ppx following bilateral TKAs.  Allergies  Allergen Reactions  . Cashew Nut Oil Other (See Comments)    Tingley, swelling   Patient Measurements: Height: 5\' 5"  (165.1 cm) Weight: 181 lb (82.101 kg) IBW/kg (Calculated) : 57   Vital Signs: Temp: 99 F (37.2 C) (01/15 0548) Temp src: Oral (01/15 0548) BP: 116/74 mmHg (01/15 0548) Pulse Rate: 86  (01/15 0548)  Labs:  Basename 04/27/12 0434 04/26/12 0440  HGB 9.5* 9.2*  HCT 27.9* 27.4*  PLT 189 175  APTT -- --  LABPROT 15.5* --  INR 1.25 --  HEPARINUNFRC -- --  CREATININE 0.56 0.61  CKTOTAL -- --  CKMB -- --  TROPONINI -- --    Estimated Creatinine Clearance: 69.2 ml/min (by C-G formula based on Cr of 0.56).  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 Has an epidural for pain control, planning to remove 1/15.  Planning Lovenox bridging to start after epidural removed.  INR responded to first dose.  CBC ok. No bleeding reported/documented.  Appetite is good.  Goal of Therapy:  INR 2-3 Monitor platelets by anticoagulation protocol: Yes   Plan:   Repeat Coumadin 2.5mg  today.  F/u removal of epidural.  Daily INR and CBC.  Coumadin education complete.  Charolotte Eke, PharmD, pager [phone]. 04/27/2012,9:40 AM.

## 2012-04-27 NOTE — Addendum Note (Signed)
Addendum  created 04/27/12 1228 by Einar Pheasant, MD   Modules edited:Inpatient Notes

## 2012-04-27 NOTE — Progress Notes (Signed)
Subjective: 2 Days Post-Op Procedure(s) (LRB): TOTAL KNEE BILATERAL (Bilateral) Patient reports pain as mild and moderate last night.  A little better today.  Encouraged PO pain pills with epidural coming out today. Family in room at bedside. Patient seen in rounds for Dr. Lequita Halt. Patient is well, but has had some minor complaints of pain in the knees, requiring pain medications Epidural to come out today.  Anticipate possible increase in pain temporarily after the epidural has been removed. Plan is to go Skilled nursing facility after hospital stay.  Plan is for [facility].  Objective: Vital signs in last 24 hours: Temp:  [98 F (36.7 C)-99.4 F (37.4 C)] 98.2 F (36.8 C) (01/15 1000) Pulse Rate:  [71-108] 71  (01/15 1000) Resp:  [14-17] 17  (01/15 1128) BP: (101-149)/(58-84) 117/70 mmHg (01/15 1000) SpO2:  [94 %-98 %] 98 % (01/15 1000)  Intake/Output from previous day:  Intake/Output Summary (Last 24 hours) at 04/27/12 1330 Last data filed at 04/27/12 1000  Gross per 24 hour  Intake   1760 ml  Output   3000 ml  Net  -1240 ml    Intake/Output this shift: Total I/O In: 240 [P.O.:240] Out: 600 [Urine:600]  Labs:  [facility] 04/27/12 0434 04/26/12 0440  HGB 9.5* 9.2*    Basename 04/27/12 0434 04/26/12 0440  WBC 12.2* 7.8  RBC 2.85* 2.79*  HCT 27.9* 27.4*  PLT 189 175    Basename 04/27/12 0434 04/26/12 0440  NA 136 136  K 3.8 3.5  CL 104 105  BUN 6 8  CREATININE 0.56 0.61  GLUCOSE 137* 164*  CALCIUM 8.8 8.3*    Basename 04/27/12 0434  LABPT --  INR 1.25    EXAM General - Patient is Alert, Appropriate and Oriented Extremity - Neurovascular intact Sensation intact distally Dorsiflexion/Plantar flexion intact No cellulitis present Dressing/Incision - clean, dry, no drainage, healing to both knee incisions Motor Function - intact, moving foot and toes well on exam.    Past Medical History  Diagnosis Date  . Arthritis     osteoarthritis  .  Hyperthyroidism   . Hypertension   . Hypercholesteremia   . GERD (gastroesophageal reflux disease)     Assessment/Plan: 2 Days Post-Op Procedure(s) (LRB): TOTAL KNEE BILATERAL (Bilateral) Principal Problem:  *OA (osteoarthritis) of knee Active Problems:  Postop Acute blood loss anemia  Estimated Body mass index is 30.12 kg/(m^2) as calculated from the following:   Height as of this encounter: 5\' 5" (1.651 m).   Weight as of this encounter: 181 lb(82.101 kg). Up with therapy Continue foley due to urinary output monitoring and she still has her epidural in place.  Will remove the catheter six hours after the epidural is removed.  Will need to note in chart when the epidural is pulled.  DVT Prophylaxis - Lovenox and Coumadin, Lovenox will not start until later this afternoon though after the epidural has been removed for twelve hours.  Will need to note in chart when the epidural is pulled. Anticipate possible increase in pain temporarily after the epidural has been removed.  Weight-Bearing as tolerated to both legs  Take Coumadin for four weeks and then discontinue. The dose may need to be adjusted based upon the INR. Please follow the INR and titrate Coumadin dose for a therapeutic range between 2.0 and 3.0 INR. After completing the four weeks of Coumadin, the patient may stop the Coumadin and resume their 81 mg Aspirin daily.   Lovenox injections will start tomorrow evening after  the epidural has been removed and continue until the INR is therapeutic at or greater than 2.0. When INR reaches the therapeutic level of equal to or greater than 2.0, the patient may discontinue the Lovenox injections.  [name], PA-C

## 2012-04-28 LAB — CBC
HCT: 27.3 % — ABNORMAL LOW (ref 36.0–46.0)
Hemoglobin: 9.2 g/dL — ABNORMAL LOW (ref 12.0–15.0)
MCH: 33 pg (ref 26.0–34.0)
MCHC: 33.7 g/dL (ref 30.0–36.0)
MCV: 97.8 fL (ref 78.0–100.0)
Platelets: 205 10*3/uL (ref 150–400)
RBC: 2.79 MIL/uL — ABNORMAL LOW (ref 3.87–5.11)
RDW: 13.2 % (ref 11.5–15.5)
WBC: 9.7 10*3/uL (ref 4.0–10.5)

## 2012-04-28 LAB — PROTIME-INR
INR: 1.49 (ref 0.00–1.49)
Prothrombin Time: 17.6 seconds — ABNORMAL HIGH (ref 11.6–15.2)

## 2012-04-28 MED ORDER — WARFARIN SODIUM 2.5 MG PO TABS
2.5000 mg | ORAL_TABLET | Freq: Once | ORAL | Status: AC
Start: 2012-04-28 — End: 2012-04-28
  Administered 2012-04-28: 2.5 mg via ORAL
  Filled 2012-04-28: qty 1

## 2012-04-28 NOTE — Progress Notes (Signed)
Occupational Therapy Treatment Patient Details Name: Marissa Powers MRN: [id number] DOB: Apr 19, 1940 Today's Date: 04/28/2012 Time: 4098-1191 and 1203 - 1212 OT Time Calculation (min): 28 min  OT Assessment / Plan / Recommendation Comments on Treatment Session Pt is making good gains with therapy.  Used bil KI today when walking    Follow Up Recommendations  SNF    Barriers to Discharge       Equipment Recommendations  3 in 1 bedside comode    Recommendations for Other Services    Frequency Min 2X/week   Plan Discharge plan remains appropriate    Precautions / Restrictions Precautions Precautions: Knee Required Braces or Orthoses: Knee Immobilizer - Right;Knee Immobilizer - Left Knee Immobilizer - Right: Discontinue once straight leg raise with < 10 degree lag Knee Immobilizer - Left: Discontinue once straight leg raise with < 10 degree lag Restrictions Weight Bearing Restrictions: Yes RLE Weight Bearing: Weight bearing as tolerated LLE Weight Bearing: Weight bearing as tolerated   Pertinent Vitals/Pain Moderate pain in bil knees--repositioned and ice applied    ADL  Lower Body Bathing: Simulated;Moderate assistance (with long sponge) Where Assessed - Lower Body Bathing: Supported sit to Pharmacist, hospital: Simulated;Moderate assistance Toilet Transfer Method: Sit to stand Transfers/Ambulation Related to ADLs: Pt now able to stand with mod A.  Cues for walking feet out and back during sit to stand ADL Comments: educated on AE.  Pt's friend bought kit for her.  Pt is only able to use sponge at this time, but demonstrated use when she is able to lift leg(s)--also demonstrated how she she cross under one to prop for use of reacher with pants.  Pt had just used commode and all adls were done. Pt verbalizes all.  She is planning [employer] for rehab    OT Diagnosis:    OT Problem List:   OT Treatment Interventions:     OT Goals Acute Rehab OT Goals OT Goal Formulation:  With patient Potential to Achieve Goals: Good ADL Goals Pt Will Perform Lower Body Bathing: with min assist;Sit to stand from chair;Sit to stand from bed ADL Goal: Lower Body Bathing - Progress: Progressing toward goals Pt Will Transfer to Toilet: with min assist;Ambulation;with DME;Stand pivot transfer ADL Goal: Toilet Transfer - Progress: Progressing toward goals  Visit Information  Last OT Received On: 04/28/12 Assistance Needed: +1    Subjective Data      Prior Functioning       Cognition  Overall Cognitive Status: Appears within functional limits for tasks assessed/performed Arousal/Alertness: Awake/alert Orientation Level: Appears intact for tasks assessed Behavior During Session: [facility] for tasks performed    Mobility  Shoulder Instructions Transfers Sit to Stand: 3: Mod assist;From chair/3-in-1 Stand to Sit: 3: Mod assist;To chair/3-in-1;With upper extremity assist Details for Transfer Assistance: cues for hand placement and walking feet out/back       Exercises     Balance     End of Session OT - End of Session Activity Tolerance: Patient tolerated treatment well Patient left: in chair;with call bell/phone within reach CPM Left Knee CPM Left Knee: Off CPM Right Knee CPM Right Knee: Off  GO     Stclair Szymborski 04/28/2012, 1:08 PM Marica Otter, OTR/L [phone] 04/28/2012

## 2012-04-28 NOTE — Progress Notes (Signed)
Physical Therapy Treatment Patient Details Name: Marissa Powers MRN: [id number] DOB: 10-03-1940 Today's Date: 04/28/2012 Time: 1125-1208 PT Time Calculation (min): 43 min  PT Assessment / Plan / Recommendation Comments on Treatment Session  pt progressing well; feeling better overall today    Follow Up Recommendations  SNF     Does the patient have the potential to tolerate intense rehabilitation     Barriers to Discharge        Equipment Recommendations  None recommended by PT    Recommendations for Other Services    Frequency 7X/week   Plan Discharge plan remains appropriate;Frequency remains appropriate    Precautions / Restrictions Precautions Precautions: Knee Required Braces or Orthoses: Knee Immobilizer - Right;Knee Immobilizer - Left Knee Immobilizer - Right: Discontinue once straight leg raise with < 10 degree lag Knee Immobilizer - Left: Discontinue once straight leg raise with < 10 degree lag Restrictions Weight Bearing Restrictions: Yes RLE Weight Bearing: Weight bearing as tolerated LLE Weight Bearing: Weight bearing as tolerated   Pertinent Vitals/Pain Pain "ok" "moderate bil knees; premedicated   Mobility  Transfers Transfers: Sit to Stand;Stand to Sit Sit to Stand: 3: Mod assist;From chair/3-in-1 Stand to Sit: 3: Mod assist;To chair/3-in-1;With upper extremity assist Details for Transfer Assistance: cues for pushing self up from recliner then walking legs backward. Ambulation/Gait Ambulation/Gait Assistance: 4: Min guard;4: Min Environmental consultant (Feet): 40 Feet Assistive device: Rolling walker Ambulation/Gait Assistance Details: bil KIs; cues for Rw distance from self and breathing Gait Pattern: Step-to pattern;Decreased step length - right;Decreased step length - left Gait velocity: decreased    Exercises Total Joint Exercises Ankle Circles/Pumps: AROM;20 reps;Both Quad Sets: AROM;10 reps;Both (pt doing on her own) Heel Slides:  AAROM;Both;15 reps Straight Leg Raises: AAROM;Both;15 reps   PT Diagnosis:    PT Problem List:   PT Treatment Interventions:     PT Goals Acute Rehab PT Goals Time For Goal Achievement: 05/03/12 Potential to Achieve Goals: Good Pt will go Sit to Stand: with min assist;with upper extremity assist PT Goal: Sit to Stand - Progress: Progressing toward goal Pt will go Stand to Sit: with supervision;with upper extremity assist PT Goal: Stand to Sit - Progress: Progressing toward goal Pt will Ambulate: 51 - 150 feet;with supervision PT Goal: Ambulate - Progress: Progressing toward goal Pt will Perform Home Exercise Program: with min assist PT Goal: Perform Home Exercise Program - Progress: Progressing toward goal  Visit Information  Last PT Received On: 04/28/12 Assistance Needed: +1 (1.5)    Subjective Data  Subjective: pretty good   Cognition  Overall Cognitive Status: Appears within functional limits for tasks assessed/performed Arousal/Alertness: Awake/alert Orientation Level: Appears intact for tasks assessed Behavior During Session: [facility] for tasks performed    Balance     End of Session PT - End of Session Equipment Utilized During Treatment: Right knee immobilizer;Left knee immobilizer Activity Tolerance: Patient tolerated treatment well Patient left: in chair;with call bell/phone within reach;with family/visitor present CPM Left Knee CPM Left Knee: Off CPM Right Knee CPM Right Knee: Off   GP     [facility] 04/28/2012, 12:21 PM

## 2012-04-28 NOTE — Progress Notes (Signed)
   Subjective: 3 Days Post-Op Procedure(s) (LRB): TOTAL KNEE BILATERAL (Bilateral) Patient reports pain as mild and moderate.  Pain is better today. Patient seen in rounds with Dr. Lequita Halt. Patient is well, and has had no acute complaints or problems Plan is to go Skilled nursing facility after hospital stay. Plan for [facility] tomorrow.  Objective: Vital signs in last 24 hours: Temp:  [98.2 F (36.8 C)-99.6 F (37.6 C)] 99.2 F (37.3 C) (01/16 0603) Pulse Rate:  [71-90] 79  (01/16 0603) Resp:  [14-17] 16  (01/16 0603) BP: (117-163)/(70-85) 137/78 mmHg (01/16 0603) SpO2:  [92 %-98 %] 92 % (01/16 0603)  Intake/Output from previous day:  Intake/Output Summary (Last 24 hours) at 04/28/12 0713 Last data filed at 04/28/12 1610  Gross per 24 hour  Intake   1736 ml  Output   2625 ml  Net   -889 ml    Labs:  Basename 04/28/12 0415 04/27/12 0434 04/26/12 0440  HGB 9.2* 9.5* 9.2*    Basename 04/28/12 0415 04/27/12 0434 04/26/12 0440  WBC 9.7 12.2* 7.8  RBC 2.79* 2.85* 2.79*  HCT 27.3* 27.9* 27.4*  PLT 205 189 175    Basename 04/27/12 0434 04/26/12 0440  NA 136 136  K 3.8 3.5  CL 104 105  BUN 6 8  CREATININE 0.56 0.61  GLUCOSE 137* 164*  CALCIUM 8.8 8.3*    Basename 04/28/12 0415 04/27/12 0434  LABPT -- --  INR 1.49 1.25    EXAM General - Patient is Alert, Appropriate and Oriented Extremity - Neurovascular intact Sensation intact distally Dorsiflexion/Plantar flexion intact No cellulitis present Dressing/Incision - clean, dry, no drainage, healing to both knees Motor Function - intact, moving feet and toes well on exam.    Past Medical History  Diagnosis Date  . Arthritis     osteoarthritis  . Hyperthyroidism   . Hypertension   . Hypercholesteremia   . GERD (gastroesophageal reflux disease)     Assessment/Plan: 3 Days Post-Op Procedure(s) (LRB): TOTAL KNEE BILATERAL (Bilateral) Principal Problem:  *OA (osteoarthritis) of knee Active  Problems:  Postop Acute blood loss anemia  Estimated Body mass index is 30.12 kg/(m^2) as calculated from the following:   Height as of this encounter: 5\' 5" (1.651 m).   Weight as of this encounter: 181 lb(82.101 kg). Up with therapy DVT Prophylaxis - Lovenox and Coumadin INR - 1.49 Add Miralax Weight-Bearing as tolerated to both legs Plan for SNF tomorrow  Marissa Powers 04/28/2012, 7:13 AM

## 2012-04-28 NOTE — Progress Notes (Signed)
04/28/12 1257  PT Visit Information  Assistance Needed +1  Precautions  Precautions Knee  Required Braces or Orthoses Knee Immobilizer - Right;Knee Immobilizer - Left  Knee Immobilizer - Right Discontinue once straight leg raise with < 10 degree lag  Knee Immobilizer - Left Discontinue once straight leg raise with < 10 degree lag  Restrictions  RLE Weight Bearing WBAT  LLE Weight Bearing WBAT  Cognition  Overall Cognitive Status Appears within functional limits for tasks assessed/performed  Arousal/Alertness Awake/alert  Orientation Level Appears intact for tasks assessed  Behavior During Session [facility] for tasks performed  Bed Mobility  Bed Mobility Sit to Supine  Supine to Sit 3: Mod assist;HOB flat  Details for Bed Mobility Assistance assitance for legs onto bed.  Transfers  Transfers Sit to Stand;Stand to Sit  Sit to Stand 3: Mod assist;From chair/3-in-1  Stand to Sit 3: Mod assist;With upper extremity assist;To bed  Details for Transfer Assistance cues for hand placement and walking feet out/back  Ambulation/Gait  Ambulation/Gait Assistance 4: Min guard;4: Min assist  Ambulation Distance (Feet) 7 Feet  Assistive device Rolling walker  Ambulation/Gait Assistance Details cues for sequence and use of UEs  Gait Pattern Step-to pattern;Decreased step length - right;Decreased step length - left  Gait velocity decreased  General Gait Details painful thsi pm; RN gave meds during session; increased pain with WBing  Total Joint Exercises  Ankle Circles/Pumps AROM;10 reps  PT - End of Session  Equipment Utilized During Treatment Right knee immobilizer;Left knee immobilizer  Activity Tolerance Patient limited by pain  Patient left in bed;with call bell/phone within reach  PT - Assessment/Plan  Comments on Treatment Session progressing well; encouraged pt take pain meds if needed, so pain does not get out of contro;  PT Plan Discharge plan remains appropriate;Frequency remains  appropriate  PT Frequency 7X/week  Follow Up Recommendations SNF  PT equipment None recommended by PT  Acute Rehab PT Goals  Time For Goal Achievement 05/03/12  Potential to Achieve Goals Good  Pt will go Sit to Supine/Side with supervision;with HOB not 0 degrees (comment degree)  PT Goal: Sit to Supine/Side - Progress Progressing toward goal  Pt will go Sit to Stand with min assist;with upper extremity assist  PT Goal: Sit to Stand - Progress Progressing toward goal  Pt will go Stand to Sit with supervision;with upper extremity assist  PT Goal: Stand to Sit - Progress Progressing toward goal  Pt will Ambulate 51 - 150 feet;with supervision  PT Goal: Ambulate - Progress Progressing toward goal  PT General Charges  $$ ACUTE PT VISIT 1 Procedure  PT Treatments  $Therapeutic Activity 8-22 mins

## 2012-04-28 NOTE — Progress Notes (Signed)
ANTICOAGULATION CONSULT NOTE - Initial Consult  Pharmacy Consult for Coumadin Indication: VTE ppx following bilateral TKAs.  Allergies  Allergen Reactions  . Cashew Nut Oil Other (See Comments)    Tingley, swelling   Patient Measurements: Height: 5\' 5"  (165.1 cm) Weight: 181 lb (82.101 kg) IBW/kg (Calculated) : 57   Vital Signs: Temp: 99.2 F (37.3 C) (01/16 0603) Temp src: Oral (01/16 0603) BP: 137/78 mmHg (01/16 0603) Pulse Rate: 79  (01/16 0603)  Labs:  Basename 04/28/12 0415 04/27/12 0434 04/26/12 0440  HGB 9.2* 9.5* --  HCT 27.3* 27.9* 27.4*  PLT 205 189 175  APTT -- -- --  LABPROT 17.6* 15.5* --  INR 1.49 1.25 --  HEPARINUNFRC -- -- --  CREATININE -- 0.56 0.61  CKTOTAL -- -- --  CKMB -- -- --  TROPONINI -- -- --    Estimated Creatinine Clearance: 69.2 ml/min (by C-G formula based on Cr of 0.56).  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 Has an epidural for pain control, removed 1/15.   Lovenox bridge started after epidural removed.  INR responded to first dose and INR trending up nicely wit 2.5mg  x 2 doses  Hgb down from expected ABLA. No bleeding noted reported/documented.  Possibly to SNF 1/17  Goal of Therapy:  INR 2-3 Monitor platelets by anticoagulation protocol: Yes   Plan:   Repeat Coumadin 2.5mg  today, don't see reason to increase dose following removal of epidural as INR trending up nicely  D/C enoxaparin when INR >= 2  Daily INR and CBC.Juliette Alcide, PharmD, BCPS.   Pager: [phone]  04/28/2012,1:48 PM.

## 2012-04-29 DIAGNOSIS — M1710 Unilateral primary osteoarthritis, unspecified knee: Secondary | ICD-10-CM | POA: Diagnosis not present

## 2012-04-29 DIAGNOSIS — M1990 Unspecified osteoarthritis, unspecified site: Secondary | ICD-10-CM | POA: Diagnosis not present

## 2012-04-29 DIAGNOSIS — S99919A Unspecified injury of unspecified ankle, initial encounter: Secondary | ICD-10-CM | POA: Diagnosis not present

## 2012-04-29 DIAGNOSIS — R269 Unspecified abnormalities of gait and mobility: Secondary | ICD-10-CM | POA: Diagnosis not present

## 2012-04-29 DIAGNOSIS — G4700 Insomnia, unspecified: Secondary | ICD-10-CM | POA: Diagnosis not present

## 2012-04-29 DIAGNOSIS — E039 Hypothyroidism, unspecified: Secondary | ICD-10-CM | POA: Diagnosis not present

## 2012-04-29 DIAGNOSIS — M25569 Pain in unspecified knee: Secondary | ICD-10-CM | POA: Diagnosis not present

## 2012-04-29 DIAGNOSIS — E785 Hyperlipidemia, unspecified: Secondary | ICD-10-CM | POA: Diagnosis not present

## 2012-04-29 DIAGNOSIS — I10 Essential (primary) hypertension: Secondary | ICD-10-CM | POA: Diagnosis not present

## 2012-04-29 DIAGNOSIS — K2100 Gastro-esophageal reflux disease with esophagitis, without bleeding: Secondary | ICD-10-CM | POA: Diagnosis not present

## 2012-04-29 DIAGNOSIS — R279 Unspecified lack of coordination: Secondary | ICD-10-CM | POA: Diagnosis not present

## 2012-04-29 DIAGNOSIS — Z96659 Presence of unspecified artificial knee joint: Secondary | ICD-10-CM | POA: Diagnosis not present

## 2012-04-29 DIAGNOSIS — Z471 Aftercare following joint replacement surgery: Secondary | ICD-10-CM | POA: Diagnosis not present

## 2012-04-29 DIAGNOSIS — D62 Acute posthemorrhagic anemia: Secondary | ICD-10-CM | POA: Diagnosis not present

## 2012-04-29 DIAGNOSIS — M48061 Spinal stenosis, lumbar region without neurogenic claudication: Secondary | ICD-10-CM | POA: Diagnosis not present

## 2012-04-29 DIAGNOSIS — M6281 Muscle weakness (generalized): Secondary | ICD-10-CM | POA: Diagnosis not present

## 2012-04-29 DIAGNOSIS — S8990XA Unspecified injury of unspecified lower leg, initial encounter: Secondary | ICD-10-CM | POA: Diagnosis not present

## 2012-04-29 LAB — PROTIME-INR
INR: 1.77 — ABNORMAL HIGH (ref 0.00–1.49)
Prothrombin Time: 20 seconds — ABNORMAL HIGH (ref 11.6–15.2)

## 2012-04-29 MED ORDER — DSS 100 MG PO CAPS: 100.0000 mg | ORAL_CAPSULE | Freq: Two times a day (BID) | ORAL | Status: DC

## 2012-04-29 MED ORDER — METHOCARBAMOL 500 MG PO TABS: 500.0000 mg | ORAL_TABLET | Freq: Four times a day (QID) | ORAL | Status: DC | PRN

## 2012-04-29 MED ORDER — HYDROCODONE-ACETAMINOPHEN 7.5-325 MG PO TABS: 1.0000 | ORAL_TABLET | ORAL | Status: DC | PRN

## 2012-04-29 MED ORDER — WARFARIN SODIUM 2.5 MG PO TABS: 2.5000 mg | ORAL_TABLET | Freq: Every day | ORAL | Status: DC

## 2012-04-29 MED ORDER — BISACODYL 10 MG RE SUPP: 10.0000 mg | Freq: Every day | RECTAL | Status: DC | PRN

## 2012-04-29 MED ORDER — WARFARIN SODIUM 2.5 MG PO TABS
2.5000 mg | ORAL_TABLET | Freq: Every day | ORAL | Status: DC
  Filled 2012-04-29: qty 1

## 2012-04-29 MED ORDER — DIPHENHYDRAMINE HCL 25 MG PO CAPS: 25.0000 mg | ORAL_CAPSULE | ORAL | Status: DC | PRN

## 2012-04-29 MED ORDER — ACETAMINOPHEN 325 MG PO TABS: 650.0000 mg | ORAL_TABLET | Freq: Four times a day (QID) | ORAL | Status: DC | PRN

## 2012-04-29 MED ORDER — POLYETHYLENE GLYCOL 3350 17 G PO PACK: 17.0000 g | PACK | Freq: Every day | ORAL | Status: DC

## 2012-04-29 MED ORDER — ONDANSETRON HCL 4 MG PO TABS: 4.0000 mg | ORAL_TABLET | Freq: Four times a day (QID) | ORAL | Status: DC | PRN

## 2012-04-29 MED ORDER — ENOXAPARIN SODIUM 30 MG/0.3ML ~~LOC~~ SOLN: 30.0000 mg | Freq: Two times a day (BID) | SUBCUTANEOUS | Status: DC

## 2012-04-29 NOTE — Progress Notes (Signed)
ANTICOAGULATION CONSULT NOTE  Pharmacy Consult for Coumadin Indication: VTE ppx following bilateral TKAs.  Allergies  Allergen Reactions  . Cashew Nut Oil Other (See Comments)    Tingley, swelling   Patient Measurements: Height: 5\' 5"  (165.1 cm) Weight: 181 lb (82.101 kg) IBW/kg (Calculated) : 57   Vital Signs: Temp: 99.3 F (37.4 C) (01/17 0453) Temp src: Oral (01/17 0453) BP: 129/74 mmHg (01/17 0453) Pulse Rate: 84  (01/17 0453)  Labs:  Basename 04/29/12 0415 04/28/12 0415 04/27/12 0434  HGB -- 9.2* 9.5*  HCT -- 27.3* 27.9*  PLT -- 205 189  APTT -- -- --  LABPROT 20.0* 17.6* 15.5*  INR 1.77* 1.49 1.25  HEPARINUNFRC -- -- --  CREATININE -- -- 0.56  CKTOTAL -- -- --  CKMB -- -- --  TROPONINI -- -- --    Estimated Creatinine Clearance: 69.2 ml/min (by C-G formula based on Cr of 0.56).  Warfarin doses administered 1/14 - 1/16:  2.5, 2.5, 2.5 mg.  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 Had an epidural for pain control, removed 1/15.  Lovenox prophylactic-dose bridge started 12 hr after epidural removed.  Hgb down slightly as expected from operative blood loss.  No overt postoperative bleeding reported/documented.  INR responding after warfarin 2.5mg  PO daily x 3 doses.  Goal of Therapy:  INR 2-3 Monitor platelets by anticoagulation protocol: Yes   Recommend:   Warfarin 2.5mg  PO daily.  Needs frequent INR monitoring for titration of warfarin dosage. (Daily INR preferred initially, but at least 3x per week until INR therapeutic and stable).  Goal INR 2 - 3.  Prophylactic-dose Lovenox as ordered by ortho until INR 2 or above.  Elie Goody, PharmD, BCPS Pager: [phone] 04/29/2012  8:12 AM

## 2012-04-29 NOTE — Progress Notes (Signed)
Physical Therapy Treatment Patient Details Name: Marissa Powers MRN: [id number] DOB: 06-25-1940 Today's Date: 04/29/2012 Time: 8657-8469 PT Time Calculation (min): 26 min  PT Assessment / Plan / Recommendation Comments on Treatment Session  POD # 4 B TKR.  Applied B KI's and instructed pt on use and proper application.  Assisted pt OOB to amb to bathroom. Assisted in BR then amb back to bed.  Applied ICE.    Follow Up Recommendations  SNF     Does the patient have the potential to tolerate intense rehabilitation     Barriers to Discharge        Equipment Recommendations  None recommended by PT    Recommendations for Other Services    Frequency 7X/week   Plan Discharge plan remains appropriate    Precautions / Restrictions Precautions Precautions: Knee Precaution Comments: Instructed py on KI use for amb and proper application Required Braces or Orthoses: Knee Immobilizer - Right;Knee Immobilizer - Left Knee Immobilizer - Right: Discontinue once straight leg raise with < 10 degree lag Knee Immobilizer - Left: Discontinue once straight leg raise with < 10 degree lag Restrictions Weight Bearing Restrictions: No RLE Weight Bearing: Weight bearing as tolerated LLE Weight Bearing: Weight bearing as tolerated   Pertinent Vitals/Pain C/o fatigue    Mobility  Bed Mobility Bed Mobility: Supine to Sit;Sit to Supine;Sitting - Scoot to Edge of Bed Supine to Sit: 3: Mod assist Sitting - Scoot to Edge of Bed: 3: Mod assist Sit to Supine: 3: Mod assist Details for Bed Mobility Assistance: Assistance for B LE on/off bed with increased time Transfers Transfers: Sit to Stand;Stand to Sit Sit to Stand: 3: Mod assist;From bed Stand to Sit: 3: Mod assist;To bed Details for Transfer Assistance: Very elevated bed 2nd B KI's sit to stand MOD assist.  Stand to sit increased time to walk B LE's forward. Ambulation/Gait Ambulation/Gait Assistance: 4: Min assist Ambulation Distance (Feet): 22  Feet (12'x2) to and from BR Assistive device: Rolling walker Ambulation/Gait Assistance Details: B KI's and increased time, <25% VC's on safety with turns. Increased time. Gait Pattern: Step-through pattern;Decreased stride length Gait velocity: decreased         PT Goals                                                     progressing    Visit Information  Last PT Received On: 04/29/12 Assistance Needed: +1    Subjective Data  Subjective: I need to go to the bathroom             End of Session PT - End of Session Equipment Utilized During Treatment: Left lower extremity prosthesis;Right knee immobilizer Activity Tolerance: Patient tolerated treatment well Patient left: in bed;with call bell/phone within reach;with family/visitor present (ICE to B knees) Nurse Communication: Patient requests pain meds   Felecia Shelling  PTA [facility]  Acute  Rehab Pager     [phone]

## 2012-04-29 NOTE — Discharge Summary (Signed)
Physician Discharge Summary   Patient ID: Marissa Powers MRN: [id number] DOB/AGE: 1940/06/21 72 y.o.  Admit date: 04/25/2012 Discharge date: 04/29/2012  Primary Diagnosis: Osteoarthritis Bilateral knee(s)   Admission Diagnoses:  Past Medical History  Diagnosis Date  . Arthritis     osteoarthritis  . Hyperthyroidism   . Hypertension   . Hypercholesteremia   . GERD (gastroesophageal reflux disease)    Discharge Diagnoses:   Principal Problem:  *OA (osteoarthritis) of knee Active Problems:  Postop Acute blood loss anemia  Estimated Body mass index is 30.12 kg/(m^2) as calculated from the following:   Height as of this encounter: 5\' 5" (1.651 m).   Weight as of this encounter: 181 lb(82.101 kg).  Classification of overweight in adults according to BMI (WHO, 1998)   Procedure:  Procedure(s) (LRB): TOTAL KNEE BILATERAL (Bilateral)   Consults: None  HPI: Marissa Powers is a 72 y.o. year old female with end stage OA of both knees with progressively worsening pain and dysfunction. She has constant pain, with activity and at rest and significant functional deficits with difficulties even with ADLs. She has had extensive non-op management including analgesics, injections of cortisone and home exercise program, but remains in significant pain with significant dysfunction.Radiographs show bone on bone arthritis medial and patellofemoral compartments both knees. We discussed the pros and cons of doing both at the same time versus one at a time including procedure, risks, potential comps and rehab course associated with each and she elects to proceed with bilateral TKA. She presents now for bilateral Total Knee Arthroplasty.   Laboratory Data: Admission on 04/25/2012  Component Date Value Range Status  . ABO/RH(D) 04/25/2012 O POS   Final  . Antibody Screen 04/25/2012 NEG   Final  . Sample Expiration 04/25/2012 04/28/2012   Final  . ABO/RH(D) 04/25/2012 O POS   Final  . WBC 04/26/2012  7.8  4.0 - 10.5 K/uL Final  . RBC 04/26/2012 2.79* 3.87 - 5.11 MIL/uL Final  . Hemoglobin 04/26/2012 9.2* 12.0 - 15.0 g/dL Final  . HCT 40/98/1191 27.4* 36.0 - 46.0 % Final  . MCV 04/26/2012 98.2  78.0 - 100.0 fL Final  . MCH 04/26/2012 33.0  26.0 - 34.0 pg Final  . MCHC 04/26/2012 33.6  30.0 - 36.0 g/dL Final  . RDW 47/82/9562 13.4  11.5 - 15.5 % Final  . Platelets 04/26/2012 175  150 - 400 K/uL Final  . Sodium 04/26/2012 136  135 - 145 mEq/L Final  . Potassium 04/26/2012 3.5  3.5 - 5.1 mEq/L Final  . Chloride 04/26/2012 105  96 - 112 mEq/L Final  . CO2 04/26/2012 23  19 - 32 mEq/L Final  . Glucose, Bld 04/26/2012 164* 70 - 99 mg/dL Final  . BUN 13/11/6576 8  6 - 23 mg/dL Final  . Creatinine, Ser 04/26/2012 0.61  0.50 - 1.10 mg/dL Final  . Calcium 46/96/2952 8.3* 8.4 - 10.5 mg/dL Final  . GFR calc non Af Amer 04/26/2012 90* >90 mL/min Final  . GFR calc Af Amer 04/26/2012 >90  >90 mL/min Final   Comment:                                 The eGFR has been calculated                          using the CKD EPI equation.  This calculation has not been                          validated in all clinical                          situations.                          eGFR's persistently                          <90 mL/min signify                          possible Chronic Kidney Disease.  . WBC 04/27/2012 12.2* 4.0 - 10.5 K/uL Final  . RBC 04/27/2012 2.85* 3.87 - 5.11 MIL/uL Final  . Hemoglobin 04/27/2012 9.5* 12.0 - 15.0 g/dL Final  . HCT 29/52/8413 27.9* 36.0 - 46.0 % Final  . MCV 04/27/2012 97.9  78.0 - 100.0 fL Final  . MCH 04/27/2012 33.3  26.0 - 34.0 pg Final  . MCHC 04/27/2012 34.1  30.0 - 36.0 g/dL Final  . RDW 24/40/1027 13.3  11.5 - 15.5 % Final  . Platelets 04/27/2012 189  150 - 400 K/uL Final  . Sodium 04/27/2012 136  135 - 145 mEq/L Final  . Potassium 04/27/2012 3.8  3.5 - 5.1 mEq/L Final  . Chloride 04/27/2012 104  96 - 112 mEq/L Final  . CO2  04/27/2012 23  19 - 32 mEq/L Final  . Glucose, Bld 04/27/2012 137* 70 - 99 mg/dL Final  . BUN 25/36/6440 6  6 - 23 mg/dL Final  . Creatinine, Ser 04/27/2012 0.56  0.50 - 1.10 mg/dL Final  . Calcium 34/74/2595 8.8  8.4 - 10.5 mg/dL Final  . GFR calc non Af Amer 04/27/2012 >90  >90 mL/min Final  . GFR calc Af Amer 04/27/2012 >90  >90 mL/min Final   Comment:                                 The eGFR has been calculated                          using the CKD EPI equation.                          This calculation has not been                          validated in all clinical                          situations.                          eGFR's persistently                          <90 mL/min signify                          possible Chronic Kidney Disease.  [name] Prothrombin Time 04/27/2012 15.5* 11.6 -  15.2 seconds Final  . INR 04/27/2012 1.25  0.00 - 1.49 Final  . WBC 04/28/2012 9.7  4.0 - 10.5 K/uL Final  . RBC 04/28/2012 2.79* 3.87 - 5.11 MIL/uL Final  . Hemoglobin 04/28/2012 9.2* 12.0 - 15.0 g/dL Final  . HCT 40/98/1191 27.3* 36.0 - 46.0 % Final  . MCV 04/28/2012 97.8  78.0 - 100.0 fL Final  . MCH 04/28/2012 33.0  26.0 - 34.0 pg Final  . MCHC 04/28/2012 33.7  30.0 - 36.0 g/dL Final  . RDW 47/82/9562 13.2  11.5 - 15.5 % Final  . Platelets 04/28/2012 205  150 - 400 K/uL Final  . Prothrombin Time 04/28/2012 17.6* 11.6 - 15.2 seconds Final  . INR 04/28/2012 1.49  0.00 - 1.49 Final  . Prothrombin Time 04/29/2012 20.0* 11.6 - 15.2 seconds Final  . INR 04/29/2012 1.77* 0.00 - 1.49 Final  Hospital Outpatient Visit on 04/07/2012  Component Date Value Range Status  . aPTT 04/07/2012 30  24 - 37 seconds Final  . WBC 04/07/2012 6.2  4.0 - 10.5 K/uL Final  . RBC 04/07/2012 3.74* 3.87 - 5.11 MIL/uL Final  . Hemoglobin 04/07/2012 12.3  12.0 - 15.0 g/dL Final  . HCT 13/11/6576 36.2  36.0 - 46.0 % Final  . MCV 04/07/2012 96.8  78.0 - 100.0 fL Final  . MCH 04/07/2012 32.9  26.0 - 34.0 pg Final  .  MCHC 04/07/2012 34.0  30.0 - 36.0 g/dL Final  . RDW 46/96/2952 13.0  11.5 - 15.5 % Final  . Platelets 04/07/2012 235  150 - 400 K/uL Final  . Sodium 04/07/2012 140  135 - 145 mEq/L Final  . Potassium 04/07/2012 4.6  3.5 - 5.1 mEq/L Final  . Chloride 04/07/2012 104  96 - 112 mEq/L Final  . CO2 04/07/2012 27  19 - 32 mEq/L Final  . Glucose, Bld 04/07/2012 99  70 - 99 mg/dL Final  . BUN 84/13/2440 16  6 - 23 mg/dL Final  . Creatinine, Ser 04/07/2012 0.71  0.50 - 1.10 mg/dL Final  . Calcium 02/07/2535 10.1  8.4 - 10.5 mg/dL Final  . Total Protein 04/07/2012 7.5  6.0 - 8.3 g/dL Final  . Albumin 64/40/3474 4.1  3.5 - 5.2 g/dL Final  . AST 25/95/6387 16  0 - 37 U/L Final  . ALT 04/07/2012 11  0 - 35 U/L Final  . Alkaline Phosphatase 04/07/2012 59  39 - 117 U/L Final  . Total Bilirubin 04/07/2012 0.6  0.3 - 1.2 mg/dL Final  . GFR calc non Af Amer 04/07/2012 85* >90 mL/min Final  . GFR calc Af Amer 04/07/2012 >90  >90 mL/min Final   Comment:                                 The eGFR has been calculated                          using the CKD EPI equation.                          This calculation has not been                          validated in all clinical  situations.                          eGFR's persistently                          <90 mL/min signify                          possible Chronic Kidney Disease.  [name] Prothrombin Time 04/07/2012 13.2  11.6 - 15.2 seconds Final  . INR 04/07/2012 1.01  0.00 - 1.49 Final  . Color, Urine 04/07/2012 YELLOW  YELLOW Final  . APPearance 04/07/2012 CLOUDY* CLEAR Final  . Specific Gravity, Urine 04/07/2012 1.023  1.005 - 1.030 Final  . pH 04/07/2012 5.5  5.0 - 8.0 Final  . Glucose, UA 04/07/2012 NEGATIVE  NEGATIVE mg/dL Final  . Hgb urine dipstick 04/07/2012 NEGATIVE  NEGATIVE Final  . Bilirubin Urine 04/07/2012 NEGATIVE  NEGATIVE Final  . Ketones, ur 04/07/2012 NEGATIVE  NEGATIVE mg/dL Final  . Protein, ur 16/01/9603  NEGATIVE  NEGATIVE mg/dL Final  . Urobilinogen, UA 04/07/2012 0.2  0.0 - 1.0 mg/dL Final  . Nitrite 54/12/8117 NEGATIVE  NEGATIVE Final  . Leukocytes, UA 04/07/2012 MODERATE* NEGATIVE Final  . MRSA, PCR 04/07/2012 NEGATIVE  NEGATIVE Final  . Staphylococcus aureus 04/07/2012 NEGATIVE  NEGATIVE Final   Comment:                                 The Xpert SA Assay (FDA                          approved for NASAL specimens                          in patients over 7 years of age),                          is one component of                          a comprehensive surveillance                          program.  Test performance has                          been validated by [employer] for patients greater                          than or equal to 62 year old.                          It is not intended                          to diagnose infection nor to  guide or monitor treatment.  . Squamous Epithelial / LPF 04/07/2012 FEW* RARE Final  . WBC, UA 04/07/2012 11-20  <3 WBC/hpf Final  . Bacteria, UA 04/07/2012 FEW* RARE Final  . Urine-Other 04/07/2012 MUCOUS PRESENT   Final  . Specimen Description 04/07/2012 URINE, CLEAN CATCH   Final  . Special Requests 04/07/2012 NONE   Final  . Culture  Setup Time 04/07/2012 04/08/2012 01:28   Final  . Colony Count 04/07/2012 NO GROWTH   Final  . Culture 04/07/2012 NO GROWTH   Final  . Report Status 04/07/2012 04/08/2012 FINAL   Final     X-Rays:Dg Chest 2 View  04/07/2012  *RADIOLOGY REPORT*  Clinical Data: Preop knee replacement surgery.  Hypertension.  CHEST - 2 VIEW  Comparison: 05/13/2006  Findings: Lungs clear.  Heart size and pulmonary vascularity normal.  No effusion.  Visualized bones unremarkable.  IMPRESSION: No acute disease   Original Report Authenticated By: D. Andria Rhein, MD     EKG: Orders placed during the hospital encounter of 04/07/12  . EKG 12-LEAD  . EKG 12-LEAD      Hospital Course: Patient was admitted to [facility] and taken to the OR and underwent the above stated procedure well without complications.  Patient tolerated the procedure well and was later transferred to the recovery room and then to the orthopaedic floor for postoperative care. Anesthesia was consulted postoperatively to place an epidural in for postoperative pain management. The patient was also given PO and IV analgesics for pain control following their surgery.  They were given 24 hours of postoperative antibiotics and started on DVT prophylaxis in the form of Coumadin after the epidural had been removed.   PT and OT were ordered for total joint protocol.  Discharge planning consulted to help with postop disposition and equipment needs.  Patient had a good night on the evening of surgery and started to get up OOB with therapy on day one.   Hemovac drains were pulled without difficulty on day one.  Continued to work with therapy into day two.  Dressings were changed on day two and both incisions were healing well and looked great.  The epidural was removed without difficulty by Anesthesia on day two.  By day three, the patient started to show progress with therapy.  They continued to receive therapy each day for continued total knee protocol.  The incisions were healing well.  They continued to progress on day four at which time the patient was seen in rounds and was ready to go to the SNF [facility].    Discharge Medications: Prior to Admission medications   Medication Sig Start Date End Date Taking? Authorizing Provider  metoprolol succinate (TOPROL-XL) 25 MG 24 hr tablet Take 25 mg by mouth at bedtime.   Yes Historical Provider, MD  zolpidem (AMBIEN) 5 MG tablet Take 5 mg by mouth at bedtime as needed.   Yes Historical Provider, MD  acetaminophen (TYLENOL) 325 MG tablet Take 2 tablets (650 mg total) by mouth every 6 (six) hours as needed (or Fever >/= 101). 04/29/12   Nehemie Casserly  Julien Girt, PA  bisacodyl (DULCOLAX) 10 MG suppository Place 1 suppository (10 mg total) rectally daily as needed. 04/29/12   Kinzi Frediani Julien Girt, PA  diphenhydrAMINE (BENADRYL) 25 mg capsule Take 1 capsule (25 mg total) by mouth every 4 (four) hours as needed for itching. 04/29/12   Mallorey Odonell, PA  docusate sodium 100 MG CAPS Take 100 mg by mouth 2 (two) times  daily. 04/29/12   Navneet Schmuck Julien Girt, PA  enoxaparin (LOVENOX) 30 MG/0.3ML injection Inject 0.3 mLs (30 mg total) into the skin every 12 (twelve) hours. Continue Lovenox injections until the INR is therapeutic at or greater than 2.0.  When INR reaches the therapeutic level of equal to or greater than 2.0, the patient may discontinue the Lovenox injections. 04/29/12   Tyanna Hach Julien Girt, PA  esomeprazole (NEXIUM) 40 MG capsule Take 40 mg by mouth every morning.    Historical Provider, MD  HYDROcodone-acetaminophen (NORCO) 7.5-325 MG per tablet Take 1-2 tablets by mouth every 4 (four) hours as needed. 04/29/12   Loda Bialas Julien Girt, PA  levothyroxine (SYNTHROID, LEVOTHROID) 112 MCG tablet Take 112 mcg by mouth every morning.    Historical Provider, MD  lisinopril (PRINIVIL,ZESTRIL) 20 MG tablet Take 20 mg by mouth every morning.    Historical Provider, MD  methocarbamol (ROBAXIN) 500 MG tablet Take 1 tablet (500 mg total) by mouth every 6 (six) hours as needed. 04/29/12   Brenn Gatton, PA  ondansetron (ZOFRAN) 4 MG tablet Take 1 tablet (4 mg total) by mouth every 6 (six) hours as needed for nausea. 04/29/12   Jetaime Pinnix, PA  polyethylene glycol (MIRALAX / GLYCOLAX) packet Take 17 g by mouth daily. Hold if loose stool or diarrhea. 04/29/12   Shamara Soza Julien Girt, PA  simvastatin (ZOCOR) 40 MG tablet Take 40 mg by mouth at bedtime.    Historical Provider, MD  warfarin (COUMADIN) 2.5 MG tablet Take 1 tablet (2.5 mg total) by mouth daily at 6 PM. Take Coumadin for 4 weeks.  The dose may need to be adjusted based upon the INR.  Follow the  INR and titrate Coumadin dose for a therapeutic range between 2.0 and 3.0 INR.  After completing the 4 weeks of Coumadin, the patient may stop the Coumadin and resume their 81 mg Aspirin daily. 04/29/12   Peggi Yono, PA    Take Coumadin for 4 weeks and then discontinue.  The dose may need to be adjusted based upon the INR.  Please follow the INR and titrate Coumadin dose for a therapeutic range between 2.0 and 3.0 INR.  After completing the three weeks of Coumadin, the patient may stop the Coumadin and resume their 81 mg Aspirin daily.  ANTICOAGULATION CONSULT NOTE  Pharmacy Consult for Coumadin  Indication: VTE ppx following bilateral TKAs. Goal of Therapy:  INR 2-3  Monitor platelets by anticoagulation protocol: Yes  Recommend:  Warfarin 2.5mg  PO daily.  Needs frequent INR monitoring for titration of warfarin dosage. (Daily INR preferred initially, but at least 3x per week until INR therapeutic and stable). Goal INR 2 - 3.  Prophylactic-dose Lovenox as ordered by ortho until INR 2 or above. Elie Goody, PharmD, BCPS  Pager: [phone]  Continue Lovenox injections until the INR is therapeutic at or greater than 2.0.  When INR reaches the therapeutic level of equal to or greater than 2.0, the patient may discontinue the Lovenox injections.   Diet: Cardiac diet Activity:WBAT Follow-up: in 2 weeks on Tuesday the 28th Disposition - Skilled nursing facility - [facility] Discharged Condition: good   Discharge Orders    Future Orders Please Complete By Expires   Diet - low sodium heart healthy      Call MD / Call 911      Comments:   If you experience chest pain or shortness of breath, CALL 911 and be transported to the hospital emergency room.  If you develope a fever above 101 F,  pus (white drainage) or increased drainage or redness at the wound, or calf pain, call your surgeon's office.   Discharge instructions      Comments:   Pick up stool softner and laxative for  home. Do not submerge incision under water. May shower. Continue to use ice for pain and swelling from surgery.  Take Coumadin for 4 weeks and then discontinue.  The dose may need to be adjusted based upon the INR.  Please follow the INR and titrate Coumadin dose for a therapeutic range between 2.0 and 3.0 INR.  After completing the 4 weeks of Coumadin, the patient may stop the Coumadin and resume their 81 mg Aspirin daily.  Continue Lovenox injections until the INR is therapeutic at or greater than 2.0.  When INR reaches the therapeutic level of equal to or greater than 2.0, the patient may discontinue the Lovenox injections.  When discharged from the skilled rehab facility, please have the facility set up the patient's Home Health Physical Therapy prior to being released.  Also provide the patient with their medications at time of release from the facility to include their pain medication, the muscle relaxants, and their blood thinner medication.  If the patient is still at the rehab facility at time of follow up appointment, please also assist the patient in arranging follow up appointment in our office and any transportation needs.   Constipation Prevention      Comments:   Drink plenty of fluids.  Prune juice may be helpful.  You may use a stool softener, such as Colace (over the counter) 100 mg twice a day.  Use MiraLax (over the counter) for constipation as needed.   Increase activity slowly as tolerated      Patient may shower      Comments:   You may shower without a dressing once there is no drainage.  Do not wash over the wound.  If drainage remains, do not shower until drainage stops.   Weight bearing as tolerated      Driving restrictions      Comments:   No driving until released by the physician.   Lifting restrictions      Comments:   No lifting until released by the physician.   CPM      Comments:   Continuous passive motion machine (CPM):      Use the CPM from O degress  to 45 for 6-8 hours per day.      You may increase by 5-10 degrees as tolerated per day.  You may break it up into 2 or 3 sessions per day.      Use CPM for 2 weeks or until you are told to stop.   TED hose      Comments:   Use stockings (TED hose) for 3 weeks on both leg(s).  You may remove them at night for sleeping.   Change dressing      Comments:   Change dressing daily with sterile 4 x 4 inch gauze dressing and apply TED hose. Do not submerge the incision under water.   Do not put a pillow under the knee. Place it under the heel.      Do not sit on low chairs, stoools or toilet seats, as it may be difficult to get up from low surfaces          Medication List     As of 04/29/2012  8:50 AM    STOP taking these medications  aspirin EC 81 MG tablet      ciprofloxacin 500 MG tablet   Commonly known as: CIPRO      CITRACAL PO      Fish Oil 1200 MG Caps      Vitamin D (Ergocalciferol) 50000 UNITS Caps   Commonly known as: DRISDOL      TAKE these medications         acetaminophen 325 MG tablet   Commonly known as: TYLENOL   Take 2 tablets (650 mg total) by mouth every 6 (six) hours as needed (or Fever >/= 101).      bisacodyl 10 MG suppository   Commonly known as: DULCOLAX   Place 1 suppository (10 mg total) rectally daily as needed.      diphenhydrAMINE 25 mg capsule   Commonly known as: BENADRYL   Take 1 capsule (25 mg total) by mouth every 4 (four) hours as needed for itching.      DSS 100 MG Caps   Take 100 mg by mouth 2 (two) times daily.      enoxaparin 30 MG/0.3ML injection   Commonly known as: LOVENOX   Inject 0.3 mLs (30 mg total) into the skin every 12 (twelve) hours. Continue Lovenox injections until the INR is therapeutic at or greater than 2.0.  When INR reaches the therapeutic level of equal to or greater than 2.0, the patient may discontinue the Lovenox injections.      esomeprazole 40 MG capsule   Commonly known as: NEXIUM   Take 40 mg by  mouth every morning.      HYDROcodone-acetaminophen 7.5-325 MG per tablet   Commonly known as: NORCO   Take 1-2 tablets by mouth every 4 (four) hours as needed.      levothyroxine 112 MCG tablet   Commonly known as: SYNTHROID, LEVOTHROID   Take 112 mcg by mouth every morning.      lisinopril 20 MG tablet   Commonly known as: PRINIVIL,ZESTRIL   Take 20 mg by mouth every morning.      methocarbamol 500 MG tablet   Commonly known as: ROBAXIN   Take 1 tablet (500 mg total) by mouth every 6 (six) hours as needed.      metoprolol succinate 25 MG 24 hr tablet   Commonly known as: TOPROL-XL   Take 25 mg by mouth at bedtime.      ondansetron 4 MG tablet   Commonly known as: ZOFRAN   Take 1 tablet (4 mg total) by mouth every 6 (six) hours as needed for nausea.      polyethylene glycol packet   Commonly known as: MIRALAX / GLYCOLAX   Take 17 g by mouth daily. Hold if loose stool or diarrhea.      simvastatin 40 MG tablet   Commonly known as: ZOCOR   Take 40 mg by mouth at bedtime.      warfarin 2.5 MG tablet   Commonly known as: COUMADIN   Take 1 tablet (2.5 mg total) by mouth daily at 6 PM. Take Coumadin for 4 weeks.  The dose may need to be adjusted based upon the INR.  Follow the INR and titrate Coumadin dose for a therapeutic range between 2.0 and 3.0 INR.  After completing the 4 weeks of Coumadin, the patient may stop the Coumadin and resume their 81 mg Aspirin daily.      zolpidem 5 MG tablet   Commonly known as: AMBIEN   Take 5 mg by mouth at bedtime  as needed.           Follow-up Information    Follow up with Loanne Drilling, MD. Schedule an appointment as soon as possible for a visit on 05/10/2012. [facility] should help arrange appointment and transportation for the patient.)    Contact information:   [address], [address] [place] [address] [place] [address] [phone]          Signed: Patrica Duel 04/29/2012, 8:50 AM

## 2012-04-29 NOTE — Progress Notes (Signed)
   Subjective: 4 Days Post-Op Procedure(s) (LRB): TOTAL KNEE BILATERAL (Bilateral) Patient reports pain as mild.   Patient seen in rounds with Dr. Lequita Halt. Patient is well, and has had no acute complaints or problems Patient is ready to go to SNF later today.  Objective: Vital signs in last 24 hours: Temp:  [97.8 F (36.6 C)-99.3 F (37.4 C)] 99.3 F (37.4 C) (01/17 0453) Pulse Rate:  [78-86] 84  (01/17 0453) Resp:  [16-18] 18  (01/17 0453) BP: (110-129)/(67-76) 129/74 mmHg (01/17 0453) SpO2:  [93 %-95 %] 95 % (01/17 0453)  Intake/Output from previous day:  Intake/Output Summary (Last 24 hours) at 04/29/12 0720 Last data filed at 04/29/12 0536  Gross per 24 hour  Intake    720 ml  Output   2375 ml  Net  -1655 ml    Intake/Output this shift:    Labs:  Basename 04/28/12 0415 04/27/12 0434  HGB 9.2* 9.5*    Basename 04/28/12 0415 04/27/12 0434  WBC 9.7 12.2*  RBC 2.79* 2.85*  HCT 27.3* 27.9*  PLT 205 189    Basename 04/27/12 0434  NA 136  K 3.8  CL 104  CO2 23  BUN 6  CREATININE 0.56  GLUCOSE 137*  CALCIUM 8.8    Basename 04/29/12 0415 04/28/12 0415  LABPT -- --  INR 1.77* 1.49    EXAM: General - Patient is Alert, Appropriate and Oriented Extremity - Neurovascular intact Sensation intact distally Dorsiflexion/Plantar flexion intact No cellulitis present Incision - clean, dry, no drainage, healing to both incisions Motor Function - intact, moving feet and toes well on exam.   Assessment/Plan: 4 Days Post-Op Procedure(s) (LRB): TOTAL KNEE BILATERAL (Bilateral) Procedure(s) (LRB): TOTAL KNEE BILATERAL (Bilateral) Past Medical History  Diagnosis Date  . Arthritis     osteoarthritis  . Hyperthyroidism   . Hypertension   . Hypercholesteremia   . GERD (gastroesophageal reflux disease)    Principal Problem:  *OA (osteoarthritis) of knee Active Problems:  Postop Acute blood loss anemia  Estimated Body mass index is 30.12 kg/(m^2) as  calculated from the following:   Height as of this encounter: 5\' 5" (1.651 m).   Weight as of this encounter: 181 lb(82.101 kg). Up with therapy Discharge to SNF Diet - Cardiac diet Follow up - in 2 weeks on Tuesday the 28th Activity - WBAT Disposition - Skilled nursing facility Condition Upon Discharge - Good D/C Meds - See DC Summary DVT Prophylaxis - Lovenox and Coumadin INR is 1.77  PERKINS, ALEXZANDREW 04/29/2012, 7:20 AM

## 2012-04-29 NOTE — Progress Notes (Signed)
Physical Therapy Treatment Patient Details Name: Marissa Powers MRN: [id number] DOB: 1941/01/10 Today's Date: 04/29/2012 Time: 4098-1191 PT Time Calculation (min): 38 min  PT Assessment / Plan / Recommendation Comments on Treatment Session  POD # 4 B TKR.  Applied B KI's and instructed pt on use and proper application.  Assisted pt OOB to amb in hallway then assisted back to bed to perform B TKR TE's.  Applied ICE.    Follow Up Recommendations  SNF     Does the patient have the potential to tolerate intense rehabilitation     Barriers to Discharge        Equipment Recommendations  None recommended by PT    Recommendations for Other Services    Frequency 7X/week   Plan Discharge plan remains appropriate    Precautions / Restrictions Precautions Precautions: Knee Precaution Comments: Instructed py on KI use for amb and proper application Required Braces or Orthoses: Knee Immobilizer - Right;Knee Immobilizer - Left Knee Immobilizer - Right: Discontinue once straight leg raise with < 10 degree lag Knee Immobilizer - Left: Discontinue once straight leg raise with < 10 degree lag Restrictions Weight Bearing Restrictions: No RLE Weight Bearing: Weight bearing as tolerated LLE Weight Bearing: Weight bearing as tolerated   Pertinent Vitals/Pain Co mod pain B LE R>L Applied ICE    Mobility  Bed Mobility Bed Mobility: Supine to Sit;Sit to Supine;Sitting - Scoot to Edge of Bed Supine to Sit: 3: Mod assist Sitting - Scoot to Edge of Bed: 3: Mod assist Sit to Supine: 3: Mod assist Details for Bed Mobility Assistance: Assistance for B LE on/off bed with increased time Transfers Transfers: Sit to Stand;Stand to Sit Sit to Stand: 3: Mod assist;From bed Stand to Sit: 3: Mod assist;To bed Details for Transfer Assistance: Very elevated bed 2nd B KI's sit to stand MOD assist.  Stand to sit increased time to walk B LE's forward. Ambulation/Gait Ambulation/Gait Assistance: 4: Min  assist Ambulation Distance (Feet): 52 Feet Assistive device: Rolling walker Ambulation/Gait Assistance Details: B KI's and increased time, <25% VC's on safety with turns. Increased time. Gait Pattern: Step-through pattern;Decreased stride length Gait velocity: decreased    Exercises Total Joint Exercises Ankle Circles/Pumps: AROM;Both;10 reps;Supine Quad Sets: AROM;Both;10 reps;Supine Gluteal Sets: AROM;Both;10 reps;Supine Towel Squeeze: AROM;Both;10 reps;Supine Heel Slides: AAROM;Right;Left;10 reps;Supine (using bed sheet to assist) Hip ABduction/ADduction: AAROM;Right;Left;10 reps;Supine Straight Leg Raises: AAROM;Right;Left;10 reps;Supine   PT Goals                                                         progressing    Visit Information  Last PT Received On: 04/29/12 Assistance Needed: +1    Subjective Data      Cognition       Balance     End of Session PT - End of Session Equipment Utilized During Treatment: Left lower extremity prosthesis;Right knee immobilizer Activity Tolerance: Patient tolerated treatment well Patient left: in bed;with call bell/phone within reach;with family/visitor present (ICE to B knees) Nurse Communication: Patient requests pain meds   Felecia Shelling  PTA [facility]  Acute  Rehab Pager     [phone]

## 2012-04-30 NOTE — Progress Notes (Signed)
Clinical Social Work Department CLINICAL SOCIAL WORK PLACEMENT NOTE 04/30/2012  Patient:  SHEVAUN, LOVAN  Account Number:  [id number] Admit date:  04/25/2012  Clinical Social Worker:  Cori Razor, LCSW  Date/time:  04/25/2012 04:41 PM  Clinical Social Work is seeking post-discharge placement for this patient at the following level of care:   SKILLED NURSING   (*CSW will update this form in Epic as items are completed)     Patient/family provided with [facility] Department of Clinical Social Work's list of facilities offering this level of care within the geographic area requested by the patient (or if unable, by the patient's family).  04/25/2012  Patient/family informed of their freedom to choose among providers that offer the needed level of care, that participate in Medicare, Medicaid or managed care program needed by the patient, have an available bed and are willing to accept the patient.    Patient/family informed of [facility]' ownership interest in [facility], as well as of the fact that they are under no obligation to receive care at this facility.  PASARR submitted to EDS on 04/26/2012 PASARR number received from EDS on   FL2 transmitted to all facilities in geographic area requested by pt/family on  04/26/2012 FL2 transmitted to all facilities within larger geographic area on   Patient informed that his/her managed care company has contracts with or will negotiate with  certain facilities, including the following:     Patient/family informed of bed offers received:  04/26/2012 Patient chooses bed at [facility] Physician recommends and patient chooses bed at    Patient to be transferred to [facility] on  04/29/2012 Patient to be transferred to facility by [facility]  The following physician request were entered in Epic:   Additional Comments:  Cori Razor LCSW [phone]

## 2012-05-04 DIAGNOSIS — M1710 Unilateral primary osteoarthritis, unspecified knee: Secondary | ICD-10-CM | POA: Diagnosis not present

## 2012-05-04 DIAGNOSIS — E039 Hypothyroidism, unspecified: Secondary | ICD-10-CM | POA: Diagnosis not present

## 2012-05-04 DIAGNOSIS — I10 Essential (primary) hypertension: Secondary | ICD-10-CM | POA: Diagnosis not present

## 2012-05-04 DIAGNOSIS — D62 Acute posthemorrhagic anemia: Secondary | ICD-10-CM | POA: Diagnosis not present

## 2012-05-13 DIAGNOSIS — D649 Anemia, unspecified: Secondary | ICD-10-CM | POA: Diagnosis not present

## 2012-05-13 DIAGNOSIS — Z471 Aftercare following joint replacement surgery: Secondary | ICD-10-CM | POA: Diagnosis not present

## 2012-05-13 DIAGNOSIS — Z96659 Presence of unspecified artificial knee joint: Secondary | ICD-10-CM | POA: Diagnosis not present

## 2012-05-13 DIAGNOSIS — I10 Essential (primary) hypertension: Secondary | ICD-10-CM | POA: Diagnosis not present

## 2012-05-13 DIAGNOSIS — Z5181 Encounter for therapeutic drug level monitoring: Secondary | ICD-10-CM | POA: Diagnosis not present

## 2012-05-13 DIAGNOSIS — M48061 Spinal stenosis, lumbar region without neurogenic claudication: Secondary | ICD-10-CM | POA: Diagnosis not present

## 2012-05-14 DIAGNOSIS — I10 Essential (primary) hypertension: Secondary | ICD-10-CM | POA: Diagnosis not present

## 2012-05-14 DIAGNOSIS — Z471 Aftercare following joint replacement surgery: Secondary | ICD-10-CM | POA: Diagnosis not present

## 2012-05-14 DIAGNOSIS — Z96659 Presence of unspecified artificial knee joint: Secondary | ICD-10-CM | POA: Diagnosis not present

## 2012-05-14 DIAGNOSIS — Z5181 Encounter for therapeutic drug level monitoring: Secondary | ICD-10-CM | POA: Diagnosis not present

## 2012-05-14 DIAGNOSIS — M48061 Spinal stenosis, lumbar region without neurogenic claudication: Secondary | ICD-10-CM | POA: Diagnosis not present

## 2012-05-15 DIAGNOSIS — Z471 Aftercare following joint replacement surgery: Secondary | ICD-10-CM | POA: Diagnosis not present

## 2012-05-15 DIAGNOSIS — Z96659 Presence of unspecified artificial knee joint: Secondary | ICD-10-CM | POA: Diagnosis not present

## 2012-05-15 DIAGNOSIS — M48061 Spinal stenosis, lumbar region without neurogenic claudication: Secondary | ICD-10-CM | POA: Diagnosis not present

## 2012-05-15 DIAGNOSIS — I10 Essential (primary) hypertension: Secondary | ICD-10-CM | POA: Diagnosis not present

## 2012-05-15 DIAGNOSIS — Z5181 Encounter for therapeutic drug level monitoring: Secondary | ICD-10-CM | POA: Diagnosis not present

## 2012-05-16 DIAGNOSIS — Z5181 Encounter for therapeutic drug level monitoring: Secondary | ICD-10-CM | POA: Diagnosis not present

## 2012-05-16 DIAGNOSIS — I10 Essential (primary) hypertension: Secondary | ICD-10-CM | POA: Diagnosis not present

## 2012-05-16 DIAGNOSIS — Z96659 Presence of unspecified artificial knee joint: Secondary | ICD-10-CM | POA: Diagnosis not present

## 2012-05-16 DIAGNOSIS — Z471 Aftercare following joint replacement surgery: Secondary | ICD-10-CM | POA: Diagnosis not present

## 2012-05-16 DIAGNOSIS — M48061 Spinal stenosis, lumbar region without neurogenic claudication: Secondary | ICD-10-CM | POA: Diagnosis not present

## 2012-05-17 DIAGNOSIS — Z96659 Presence of unspecified artificial knee joint: Secondary | ICD-10-CM | POA: Diagnosis not present

## 2012-05-17 DIAGNOSIS — I10 Essential (primary) hypertension: Secondary | ICD-10-CM | POA: Diagnosis not present

## 2012-05-17 DIAGNOSIS — M48061 Spinal stenosis, lumbar region without neurogenic claudication: Secondary | ICD-10-CM | POA: Diagnosis not present

## 2012-05-17 DIAGNOSIS — Z471 Aftercare following joint replacement surgery: Secondary | ICD-10-CM | POA: Diagnosis not present

## 2012-05-17 DIAGNOSIS — Z5181 Encounter for therapeutic drug level monitoring: Secondary | ICD-10-CM | POA: Diagnosis not present

## 2012-05-18 DIAGNOSIS — M48061 Spinal stenosis, lumbar region without neurogenic claudication: Secondary | ICD-10-CM | POA: Diagnosis not present

## 2012-05-18 DIAGNOSIS — Z5181 Encounter for therapeutic drug level monitoring: Secondary | ICD-10-CM | POA: Diagnosis not present

## 2012-05-18 DIAGNOSIS — Z471 Aftercare following joint replacement surgery: Secondary | ICD-10-CM | POA: Diagnosis not present

## 2012-05-18 DIAGNOSIS — Z96659 Presence of unspecified artificial knee joint: Secondary | ICD-10-CM | POA: Diagnosis not present

## 2012-05-18 DIAGNOSIS — I10 Essential (primary) hypertension: Secondary | ICD-10-CM | POA: Diagnosis not present

## 2012-05-19 DIAGNOSIS — Z471 Aftercare following joint replacement surgery: Secondary | ICD-10-CM | POA: Diagnosis not present

## 2012-05-19 DIAGNOSIS — Z5181 Encounter for therapeutic drug level monitoring: Secondary | ICD-10-CM | POA: Diagnosis not present

## 2012-05-19 DIAGNOSIS — M48061 Spinal stenosis, lumbar region without neurogenic claudication: Secondary | ICD-10-CM | POA: Diagnosis not present

## 2012-05-19 DIAGNOSIS — I10 Essential (primary) hypertension: Secondary | ICD-10-CM | POA: Diagnosis not present

## 2012-05-19 DIAGNOSIS — Z96659 Presence of unspecified artificial knee joint: Secondary | ICD-10-CM | POA: Diagnosis not present

## 2012-05-20 DIAGNOSIS — Z5181 Encounter for therapeutic drug level monitoring: Secondary | ICD-10-CM | POA: Diagnosis not present

## 2012-05-20 DIAGNOSIS — Z471 Aftercare following joint replacement surgery: Secondary | ICD-10-CM | POA: Diagnosis not present

## 2012-05-20 DIAGNOSIS — M48061 Spinal stenosis, lumbar region without neurogenic claudication: Secondary | ICD-10-CM | POA: Diagnosis not present

## 2012-05-20 DIAGNOSIS — I10 Essential (primary) hypertension: Secondary | ICD-10-CM | POA: Diagnosis not present

## 2012-05-20 DIAGNOSIS — Z96659 Presence of unspecified artificial knee joint: Secondary | ICD-10-CM | POA: Diagnosis not present

## 2012-05-23 DIAGNOSIS — Z96659 Presence of unspecified artificial knee joint: Secondary | ICD-10-CM | POA: Diagnosis not present

## 2012-05-25 DIAGNOSIS — Z96659 Presence of unspecified artificial knee joint: Secondary | ICD-10-CM | POA: Diagnosis not present

## 2012-05-27 DIAGNOSIS — Z96659 Presence of unspecified artificial knee joint: Secondary | ICD-10-CM | POA: Diagnosis not present

## 2012-05-30 DIAGNOSIS — IMO0002 Reserved for concepts with insufficient information to code with codable children: Secondary | ICD-10-CM | POA: Diagnosis not present

## 2012-05-30 DIAGNOSIS — M1710 Unilateral primary osteoarthritis, unspecified knee: Secondary | ICD-10-CM | POA: Diagnosis not present

## 2012-05-31 DIAGNOSIS — Z96659 Presence of unspecified artificial knee joint: Secondary | ICD-10-CM | POA: Diagnosis not present

## 2012-06-01 DIAGNOSIS — IMO0002 Reserved for concepts with insufficient information to code with codable children: Secondary | ICD-10-CM | POA: Diagnosis not present

## 2012-06-03 DIAGNOSIS — IMO0002 Reserved for concepts with insufficient information to code with codable children: Secondary | ICD-10-CM | POA: Diagnosis not present

## 2012-06-06 DIAGNOSIS — M25569 Pain in unspecified knee: Secondary | ICD-10-CM | POA: Diagnosis not present

## 2012-06-08 DIAGNOSIS — M1710 Unilateral primary osteoarthritis, unspecified knee: Secondary | ICD-10-CM | POA: Diagnosis not present

## 2012-06-08 DIAGNOSIS — IMO0002 Reserved for concepts with insufficient information to code with codable children: Secondary | ICD-10-CM | POA: Diagnosis not present

## 2012-06-10 DIAGNOSIS — IMO0002 Reserved for concepts with insufficient information to code with codable children: Secondary | ICD-10-CM | POA: Diagnosis not present

## 2012-06-10 DIAGNOSIS — M1710 Unilateral primary osteoarthritis, unspecified knee: Secondary | ICD-10-CM | POA: Diagnosis not present

## 2012-06-13 DIAGNOSIS — IMO0002 Reserved for concepts with insufficient information to code with codable children: Secondary | ICD-10-CM | POA: Diagnosis not present

## 2012-06-13 DIAGNOSIS — M1710 Unilateral primary osteoarthritis, unspecified knee: Secondary | ICD-10-CM | POA: Diagnosis not present

## 2012-06-15 DIAGNOSIS — M1710 Unilateral primary osteoarthritis, unspecified knee: Secondary | ICD-10-CM | POA: Diagnosis not present

## 2012-06-15 DIAGNOSIS — IMO0002 Reserved for concepts with insufficient information to code with codable children: Secondary | ICD-10-CM | POA: Diagnosis not present

## 2012-06-20 DIAGNOSIS — IMO0002 Reserved for concepts with insufficient information to code with codable children: Secondary | ICD-10-CM | POA: Diagnosis not present

## 2012-06-22 DIAGNOSIS — IMO0002 Reserved for concepts with insufficient information to code with codable children: Secondary | ICD-10-CM | POA: Diagnosis not present

## 2012-06-24 DIAGNOSIS — M25569 Pain in unspecified knee: Secondary | ICD-10-CM | POA: Diagnosis not present

## 2012-06-27 DIAGNOSIS — M25569 Pain in unspecified knee: Secondary | ICD-10-CM | POA: Diagnosis not present

## 2012-06-29 DIAGNOSIS — M25569 Pain in unspecified knee: Secondary | ICD-10-CM | POA: Diagnosis not present

## 2012-07-01 DIAGNOSIS — M25569 Pain in unspecified knee: Secondary | ICD-10-CM | POA: Diagnosis not present

## 2012-07-04 DIAGNOSIS — M25569 Pain in unspecified knee: Secondary | ICD-10-CM | POA: Diagnosis not present

## 2012-07-07 DIAGNOSIS — M25569 Pain in unspecified knee: Secondary | ICD-10-CM | POA: Diagnosis not present

## 2012-07-12 DIAGNOSIS — M25569 Pain in unspecified knee: Secondary | ICD-10-CM | POA: Diagnosis not present

## 2012-07-14 DIAGNOSIS — M25569 Pain in unspecified knee: Secondary | ICD-10-CM | POA: Diagnosis not present

## 2012-07-18 DIAGNOSIS — M25569 Pain in unspecified knee: Secondary | ICD-10-CM | POA: Diagnosis not present

## 2012-08-15 ENCOUNTER — Other Ambulatory Visit: Payer: Self-pay | Admitting: Nurse Practitioner

## 2012-08-15 ENCOUNTER — Encounter (HOSPITAL_COMMUNITY): Payer: Self-pay

## 2012-08-15 ENCOUNTER — Ambulatory Visit (HOSPITAL_COMMUNITY)
Admission: RE | Admit: 2012-08-15 | Discharge: 2012-08-15 | Disposition: A | Discharge status: Home / Self Care | Payer: Medicare Other | Source: Ambulatory Visit | Attending: Nurse Practitioner | Admitting: Nurse Practitioner

## 2012-08-15 ENCOUNTER — Other Ambulatory Visit (HOSPITAL_COMMUNITY): Payer: Self-pay | Admitting: Nurse Practitioner

## 2012-08-15 DIAGNOSIS — K5730 Diverticulosis of large intestine without perforation or abscess without bleeding: Secondary | ICD-10-CM | POA: Insufficient documentation

## 2012-08-15 DIAGNOSIS — R109 Unspecified abdominal pain: Secondary | ICD-10-CM | POA: Diagnosis not present

## 2012-08-15 DIAGNOSIS — K5732 Diverticulitis of large intestine without perforation or abscess without bleeding: Secondary | ICD-10-CM | POA: Diagnosis not present

## 2012-08-15 DIAGNOSIS — N949 Unspecified condition associated with female genital organs and menstrual cycle: Secondary | ICD-10-CM | POA: Diagnosis not present

## 2012-08-15 MED ORDER — IOHEXOL 300 MG/ML  SOLN
100.0000 mL | Freq: Once | INTRAMUSCULAR | Status: AC | PRN
  Administered 2012-08-15: 100 mL via INTRAVENOUS

## 2012-08-15 MED ORDER — IOHEXOL 300 MG/ML  SOLN: 50.0000 mL | Freq: Once | INTRAMUSCULAR | Status: DC | PRN

## 2012-08-15 MED ORDER — IOHEXOL 300 MG/ML  SOLN
50.0000 mL | Freq: Once | INTRAMUSCULAR | Status: AC | PRN
  Administered 2012-08-15: 50 mL via ORAL

## 2012-08-17 DIAGNOSIS — M949 Disorder of cartilage, unspecified: Secondary | ICD-10-CM | POA: Diagnosis not present

## 2012-08-17 DIAGNOSIS — Z09 Encounter for follow-up examination after completed treatment for conditions other than malignant neoplasm: Secondary | ICD-10-CM | POA: Diagnosis not present

## 2012-08-17 DIAGNOSIS — Z1231 Encounter for screening mammogram for malignant neoplasm of breast: Secondary | ICD-10-CM | POA: Diagnosis not present

## 2012-08-17 DIAGNOSIS — M899 Disorder of bone, unspecified: Secondary | ICD-10-CM | POA: Diagnosis not present

## 2012-08-17 DIAGNOSIS — Z96659 Presence of unspecified artificial knee joint: Secondary | ICD-10-CM | POA: Diagnosis not present

## 2012-09-02 ENCOUNTER — Telehealth: Payer: Self-pay | Admitting: Obstetrics and Gynecology

## 2012-09-02 DIAGNOSIS — D239 Other benign neoplasm of skin, unspecified: Secondary | ICD-10-CM | POA: Diagnosis not present

## 2012-09-02 DIAGNOSIS — D2370 Other benign neoplasm of skin of unspecified lower limb, including hip: Secondary | ICD-10-CM | POA: Diagnosis not present

## 2012-09-02 DIAGNOSIS — L821 Other seborrheic keratosis: Secondary | ICD-10-CM | POA: Diagnosis not present

## 2012-09-02 DIAGNOSIS — L905 Scar conditions and fibrosis of skin: Secondary | ICD-10-CM | POA: Diagnosis not present

## 2012-09-02 DIAGNOSIS — L538 Other specified erythematous conditions: Secondary | ICD-10-CM | POA: Diagnosis not present

## 2012-09-02 DIAGNOSIS — D1801 Hemangioma of skin and subcutaneous tissue: Secondary | ICD-10-CM | POA: Diagnosis not present

## 2012-09-02 NOTE — Telephone Encounter (Signed)
PT returned Carol's phone call.

## 2012-09-02 NOTE — Telephone Encounter (Signed)
Pt. Notified  of BMD results (great) repeat in 2-3 years.

## 2012-09-07 DIAGNOSIS — Z0000 Encounter for general adult medical examination without abnormal findings: Secondary | ICD-10-CM | POA: Diagnosis not present

## 2012-09-07 DIAGNOSIS — R002 Palpitations: Secondary | ICD-10-CM | POA: Diagnosis not present

## 2012-09-07 DIAGNOSIS — Z1331 Encounter for screening for depression: Secondary | ICD-10-CM | POA: Diagnosis not present

## 2012-09-07 DIAGNOSIS — I10 Essential (primary) hypertension: Secondary | ICD-10-CM | POA: Diagnosis not present

## 2012-09-07 DIAGNOSIS — E039 Hypothyroidism, unspecified: Secondary | ICD-10-CM | POA: Diagnosis not present

## 2012-09-15 DIAGNOSIS — R002 Palpitations: Secondary | ICD-10-CM | POA: Diagnosis not present

## 2012-12-26 DIAGNOSIS — E039 Hypothyroidism, unspecified: Secondary | ICD-10-CM | POA: Diagnosis not present

## 2012-12-26 DIAGNOSIS — I10 Essential (primary) hypertension: Secondary | ICD-10-CM | POA: Diagnosis not present

## 2013-01-27 DIAGNOSIS — Z23 Encounter for immunization: Secondary | ICD-10-CM | POA: Diagnosis not present

## 2013-02-08 ENCOUNTER — Encounter: Payer: Self-pay | Admitting: *Deleted

## 2013-02-08 DIAGNOSIS — E7800 Pure hypercholesterolemia, unspecified: Secondary | ICD-10-CM | POA: Insufficient documentation

## 2013-02-08 DIAGNOSIS — M1990 Unspecified osteoarthritis, unspecified site: Secondary | ICD-10-CM | POA: Insufficient documentation

## 2013-02-13 ENCOUNTER — Ambulatory Visit (INDEPENDENT_AMBULATORY_CARE_PROVIDER_SITE_OTHER): Payer: Medicare Other | Admitting: Gynecology

## 2013-02-13 ENCOUNTER — Ambulatory Visit: Payer: Self-pay | Admitting: Obstetrics and Gynecology

## 2013-02-13 ENCOUNTER — Encounter: Payer: Self-pay | Admitting: Gynecology

## 2013-02-13 VITALS — BP 116/78 | HR 64 | Resp 16 | Ht 65.5 in | Wt 180.0 lb

## 2013-02-13 DIAGNOSIS — I10 Essential (primary) hypertension: Secondary | ICD-10-CM | POA: Insufficient documentation

## 2013-02-13 DIAGNOSIS — N762 Acute vulvitis: Secondary | ICD-10-CM

## 2013-02-13 DIAGNOSIS — Z124 Encounter for screening for malignant neoplasm of cervix: Secondary | ICD-10-CM | POA: Diagnosis not present

## 2013-02-13 DIAGNOSIS — Z01419 Encounter for gynecological examination (general) (routine) without abnormal findings: Secondary | ICD-10-CM | POA: Diagnosis not present

## 2013-02-13 DIAGNOSIS — E559 Vitamin D deficiency, unspecified: Secondary | ICD-10-CM

## 2013-02-13 DIAGNOSIS — K219 Gastro-esophageal reflux disease without esophagitis: Secondary | ICD-10-CM | POA: Insufficient documentation

## 2013-02-13 DIAGNOSIS — N760 Acute vaginitis: Secondary | ICD-10-CM

## 2013-02-13 MED ORDER — CLOBETASOL PROPIONATE 0.05 % EX OINT: TOPICAL_OINTMENT | CUTANEOUS | Status: DC

## 2013-02-13 NOTE — Progress Notes (Signed)
72 y.o. Widowed Caucasian female   [id number] here for annual exam. She does not  report hot flashes, does not have night sweats, does not have vaginal dryness.  She is not using lubricants.  She does not report post-menopasual bleeding.  No LMP recorded. Patient is postmenopausal.          Sexually active: no  The current method of family planning is post menopausal status.    Exercising: yes  walk about a mile qd Last pap: 12/25/2008 Abnormal PAP: yes, years ago  Mammogram: 08/17/12 Bi-Rads 2  BSE: no Colonoscopy: 2008, polyps DEXA: 08/17/12 Alcohol: 7 drinks/wk Tobacco: no  Hgb: PCP ; Urine: PCP  Health Maintenance  Topic Date Due  . Colonoscopy  06/03/1991  . Zostavax  06/02/2001  . Pneumococcal Polysaccharide Vaccine Age 81 And Over  06/02/2006  . Influenza Vaccine  11/11/2012  . Tetanus/tdap  04/14/2015    Family History  Problem Relation Age of Onset  . Aortic aneurysm Mother   . Breast cancer Maternal Aunt 59  . Breast cancer Maternal Grandmother   . Heart attack Maternal Grandmother     Patient Active Problem List   Diagnosis Date Noted  . Arthritis   . Hypercholesteremia   . Postop Acute blood loss anemia 04/26/2012  . OA (osteoarthritis) of knee 04/25/2012    Past Medical History  Diagnosis Date  . Arthritis     osteoarthritis  . Hyperthyroidism   . Hypertension   . Hypercholesteremia   . GERD (gastroesophageal reflux disease)   . Dysplasia of cervix, low grade (CIN 1) 1990    HPV, Cryo, Laser  . Fibroid 2002    1 cm, 2 cm  . Fibroadenoma     Left, at 5 o'clock, not excised     Past Surgical History  Procedure Laterality Date  . Tonsillectomy    . Cataract extraction      bilateral  . Colon polyp removal    . Thyroidectomy    . Total knee arthroplasty  04/25/2012    Procedure: TOTAL KNEE BILATERAL;  Surgeon: Loanne Drilling, MD;  Location: [facility];  Service: Orthopedics;  Laterality: Bilateral;  . Retinal detachment surgery  2000  . Breast biopsy  Bilateral 1970's    x2 one a side    Allergies: Cashew nut oil  Current Outpatient Prescriptions  Medication Sig Dispense Refill  . aspirin 81 MG tablet Take 81 mg by mouth daily.      [name] CALCIUM PO Take by mouth.      . diphenhydrAMINE (BENADRYL) 25 mg capsule Take 1 capsule (25 mg total) by mouth every 4 (four) hours as needed for itching.  30 capsule  0  . enoxaparin (LOVENOX) 30 MG/0.3ML injection Inject 0.3 mLs (30 mg total) into the skin every 12 (twelve) hours. Continue Lovenox injections until the INR is therapeutic at or greater than 2.0.  When INR reaches the therapeutic level of equal to or greater than 2.0, the patient may discontinue the Lovenox injections.  7 Syringe  0  . esomeprazole (NEXIUM) 40 MG capsule Take 40 mg by mouth every morning.      . Ibuprofen (ADVIL PO) Take by mouth.      . levothyroxine (SYNTHROID, LEVOTHROID) 112 MCG tablet Take 112 mcg by mouth every morning.      [name] lisinopril (PRINIVIL,ZESTRIL) 20 MG tablet Take 20 mg by mouth every morning.      . metoprolol succinate (TOPROL-XL) 25 MG 24 hr tablet Take  25 mg by mouth at bedtime.      . simvastatin (ZOCOR) 40 MG tablet Take 40 mg by mouth at bedtime.      [name] zolpidem (AMBIEN) 5 MG tablet Take 5 mg by mouth at bedtime as needed.       No current facility-administered medications for this visit.    ROS: Pertinent items are noted in HPI.  Exam:    BP 116/78  Pulse 64  Resp 16  Ht 5' 5.5" (1.664 m)  Wt 180 lb (81.647 kg)  BMI 29.49 kg/m2 Weight change: @WEIGHTCHANGE @ Last 3 height recordings:  Ht Readings from Last 3 Encounters:  02/13/13 5' 5.5" (1.664 m)  04/25/12 5\' 5"  (1.651 m)  04/25/12 5\' 5"  (1.651 m)   General appearance: alert, cooperative and appears stated age Head: Normocephalic, without obvious abnormality, atraumatic Neck: no adenopathy, no carotid bruit, no JVD, supple, symmetrical, trachea midline and thyroid not enlarged, symmetric, no tenderness/mass/nodules Lungs: clear to  auscultation bilaterally Breasts: normal appearance, no masses or tenderness Heart: regular rate and rhythm, S1, S2 normal, no murmur, click, rub or gallop Abdomen: soft, non-tender; bowel sounds normal; no masses,  no organomegaly Extremities: extremities normal, atraumatic, no cyanosis or edema Skin: Skin color, texture, turgor normal. No rashes or lesions Lymph nodes: Cervical, supraclavicular, and axillary nodes normal. no inguinal nodes palpated Neurologic: Grossly normal   Pelvic: External genitalia:  See picture                 Urethra: normal appearing urethra with no masses, tenderness or lesions              Bartholins and Skenes: normal                 Vagina: atrophic              Cervix: atrophic              Pap taken: no        Bimanual Exam:  Uterus:  Small, mobile                                      Adnexa:    no masses                                      Rectovaginal: Confirms                                      Anus:  normal sphincter tone, no lesions  A: well woman Vulvar hypopigmentation     P: mammogram pap smear not done Check vit d level-last 2010 Refill temovate counseled on breast self exam, mammography screening, adequate intake of calcium and vitamin D, diet and exercise return annually or prn Discussed PAP guideline changes, importance of weight bearing exercises, calcium, vit D and balanced diet.  An After Visit Summary was printed and given to the patient.

## 2013-02-14 DIAGNOSIS — E559 Vitamin D deficiency, unspecified: Secondary | ICD-10-CM | POA: Diagnosis not present

## 2013-02-15 LAB — VITAMIN D 25 HYDROXY (VIT D DEFICIENCY, FRACTURES): Vit D, 25-Hydroxy: 46 ng/mL (ref 30–89)

## 2013-02-20 ENCOUNTER — Telehealth: Payer: Self-pay | Admitting: *Deleted

## 2013-02-20 NOTE — Telephone Encounter (Signed)
Pt.notified

## 2013-02-20 NOTE — Telephone Encounter (Signed)
Message copied by Luisa Dago on Mon Feb 20, 2013 11:26 AM ------      Message from: Ria Comment R      Created: Thu Feb 16, 2013  8:38 AM       let patient know results ------

## 2013-02-20 NOTE — Telephone Encounter (Signed)
I have attempted to contact this patient by phone with the following results: left message to return my call on answering machine (home).  

## 2013-03-17 DIAGNOSIS — Z961 Presence of intraocular lens: Secondary | ICD-10-CM | POA: Diagnosis not present

## 2013-03-17 DIAGNOSIS — H35379 Puckering of macula, unspecified eye: Secondary | ICD-10-CM | POA: Diagnosis not present

## 2013-05-17 ENCOUNTER — Other Ambulatory Visit: Payer: Self-pay | Admitting: Nurse Practitioner

## 2013-05-17 DIAGNOSIS — R112 Nausea with vomiting, unspecified: Secondary | ICD-10-CM

## 2013-05-22 ENCOUNTER — Ambulatory Visit
Admission: RE | Admit: 2013-05-22 | Discharge: 2013-05-22 | Disposition: A | Discharge status: Home / Self Care | Payer: Medicare Other | Source: Ambulatory Visit | Attending: Nurse Practitioner | Admitting: Nurse Practitioner

## 2013-05-22 DIAGNOSIS — R112 Nausea with vomiting, unspecified: Secondary | ICD-10-CM

## 2013-06-10 DIAGNOSIS — Z96659 Presence of unspecified artificial knee joint: Secondary | ICD-10-CM | POA: Diagnosis not present

## 2013-06-10 DIAGNOSIS — M1710 Unilateral primary osteoarthritis, unspecified knee: Secondary | ICD-10-CM | POA: Diagnosis not present

## 2013-08-21 DIAGNOSIS — Z1231 Encounter for screening mammogram for malignant neoplasm of breast: Secondary | ICD-10-CM | POA: Diagnosis not present

## 2013-08-22 DIAGNOSIS — L908 Other atrophic disorders of skin: Secondary | ICD-10-CM | POA: Diagnosis not present

## 2013-08-22 DIAGNOSIS — D239 Other benign neoplasm of skin, unspecified: Secondary | ICD-10-CM | POA: Diagnosis not present

## 2013-08-22 DIAGNOSIS — L821 Other seborrheic keratosis: Secondary | ICD-10-CM | POA: Diagnosis not present

## 2013-08-22 DIAGNOSIS — D1801 Hemangioma of skin and subcutaneous tissue: Secondary | ICD-10-CM | POA: Diagnosis not present

## 2013-08-22 DIAGNOSIS — L918 Other hypertrophic disorders of the skin: Secondary | ICD-10-CM | POA: Diagnosis not present

## 2013-09-13 ENCOUNTER — Other Ambulatory Visit: Payer: Self-pay | Admitting: Internal Medicine

## 2013-09-13 ENCOUNTER — Ambulatory Visit
Admission: RE | Admit: 2013-09-13 | Discharge: 2013-09-13 | Disposition: A | Discharge status: Home / Self Care | Payer: Medicare Other | Source: Ambulatory Visit | Attending: Internal Medicine | Admitting: Internal Medicine

## 2013-09-13 DIAGNOSIS — R112 Nausea with vomiting, unspecified: Secondary | ICD-10-CM | POA: Diagnosis not present

## 2013-09-13 DIAGNOSIS — I779 Disorder of arteries and arterioles, unspecified: Secondary | ICD-10-CM | POA: Diagnosis not present

## 2013-09-13 DIAGNOSIS — K219 Gastro-esophageal reflux disease without esophagitis: Secondary | ICD-10-CM | POA: Diagnosis not present

## 2013-09-13 DIAGNOSIS — K449 Diaphragmatic hernia without obstruction or gangrene: Secondary | ICD-10-CM | POA: Diagnosis not present

## 2013-09-13 DIAGNOSIS — I10 Essential (primary) hypertension: Secondary | ICD-10-CM | POA: Diagnosis not present

## 2013-09-14 ENCOUNTER — Other Ambulatory Visit: Payer: Self-pay | Admitting: Internal Medicine

## 2013-09-14 DIAGNOSIS — E039 Hypothyroidism, unspecified: Secondary | ICD-10-CM | POA: Diagnosis not present

## 2013-09-14 DIAGNOSIS — Z0000 Encounter for general adult medical examination without abnormal findings: Secondary | ICD-10-CM | POA: Diagnosis not present

## 2013-09-14 DIAGNOSIS — Z79899 Other long term (current) drug therapy: Secondary | ICD-10-CM | POA: Diagnosis not present

## 2013-09-14 DIAGNOSIS — K219 Gastro-esophageal reflux disease without esophagitis: Secondary | ICD-10-CM | POA: Diagnosis not present

## 2013-09-14 DIAGNOSIS — D126 Benign neoplasm of colon, unspecified: Secondary | ICD-10-CM | POA: Diagnosis not present

## 2013-09-14 DIAGNOSIS — R1110 Vomiting, unspecified: Secondary | ICD-10-CM | POA: Diagnosis not present

## 2013-09-14 DIAGNOSIS — I779 Disorder of arteries and arterioles, unspecified: Secondary | ICD-10-CM

## 2013-09-14 DIAGNOSIS — E785 Hyperlipidemia, unspecified: Secondary | ICD-10-CM | POA: Diagnosis not present

## 2013-09-14 DIAGNOSIS — M1990 Unspecified osteoarthritis, unspecified site: Secondary | ICD-10-CM | POA: Diagnosis not present

## 2013-09-14 DIAGNOSIS — I6529 Occlusion and stenosis of unspecified carotid artery: Secondary | ICD-10-CM | POA: Diagnosis not present

## 2013-09-14 DIAGNOSIS — Z6829 Body mass index (BMI) 29.0-29.9, adult: Secondary | ICD-10-CM | POA: Diagnosis not present

## 2013-09-18 DIAGNOSIS — D7589 Other specified diseases of blood and blood-forming organs: Secondary | ICD-10-CM | POA: Diagnosis not present

## 2013-09-18 DIAGNOSIS — E538 Deficiency of other specified B group vitamins: Secondary | ICD-10-CM | POA: Diagnosis not present

## 2013-10-27 ENCOUNTER — Ambulatory Visit
Admission: RE | Admit: 2013-10-27 | Discharge: 2013-10-27 | Disposition: A | Discharge status: Home / Self Care | Payer: Medicare Other | Source: Ambulatory Visit | Attending: Internal Medicine | Admitting: Internal Medicine

## 2013-10-27 DIAGNOSIS — R809 Proteinuria, unspecified: Secondary | ICD-10-CM | POA: Diagnosis not present

## 2013-10-27 DIAGNOSIS — I658 Occlusion and stenosis of other precerebral arteries: Secondary | ICD-10-CM | POA: Diagnosis not present

## 2013-10-27 DIAGNOSIS — R82998 Other abnormal findings in urine: Secondary | ICD-10-CM | POA: Diagnosis not present

## 2013-10-27 DIAGNOSIS — I779 Disorder of arteries and arterioles, unspecified: Secondary | ICD-10-CM

## 2013-11-08 DIAGNOSIS — R141 Gas pain: Secondary | ICD-10-CM | POA: Diagnosis not present

## 2013-11-08 DIAGNOSIS — Z79899 Other long term (current) drug therapy: Secondary | ICD-10-CM | POA: Diagnosis not present

## 2013-11-08 DIAGNOSIS — R143 Flatulence: Secondary | ICD-10-CM | POA: Diagnosis not present

## 2013-11-08 DIAGNOSIS — R112 Nausea with vomiting, unspecified: Secondary | ICD-10-CM | POA: Diagnosis not present

## 2013-12-26 IMAGING — CT CT ABD-PELV W/ CM
2 of 5 series · 16 of 46 positions shown, 18 images · IV contrast (OMNIPAQUE)
Comparison: Abdominal ultrasound - 01/27/2007

CLINICAL DATA: Lower pelvic pain, evaluate for appendicitis

CT ABDOMEN AND PELVIS WITH CONTRAST
TECHNIQUE: Multidetector CT imaging of the abdomen and pelvis was
performed following the standard protocol during bolus
administration of intravenous contrast.
Contrast: 50mL OMNIPAQUE IOHEXOL 300 MG/ML  SOLN, 100mL OMNIPAQUE
IOHEXOL 300 MG/ML  SOLN

[Series 2: rtn ap with st · axial · 0.82mm/px · z∈[-469,-109]mm · 13 of 82 slices shown, 15 images]
[im 5/82  soft-tissue]
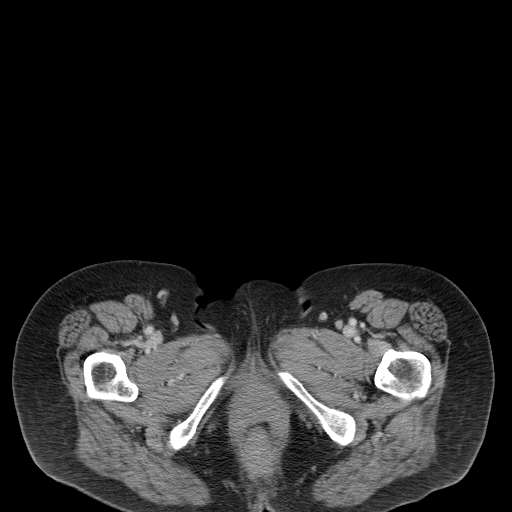
[im 5/82  bone]
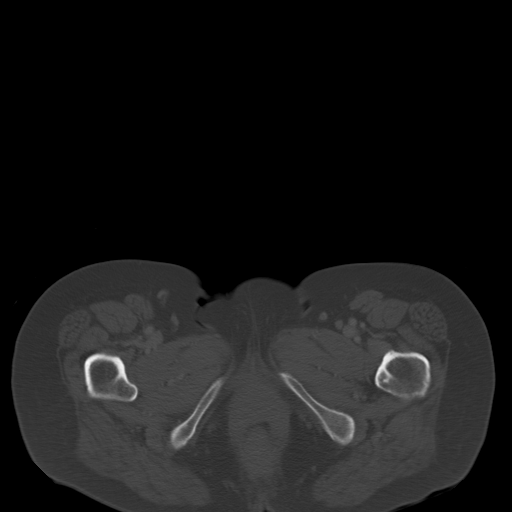
[im 13/82  soft-tissue]
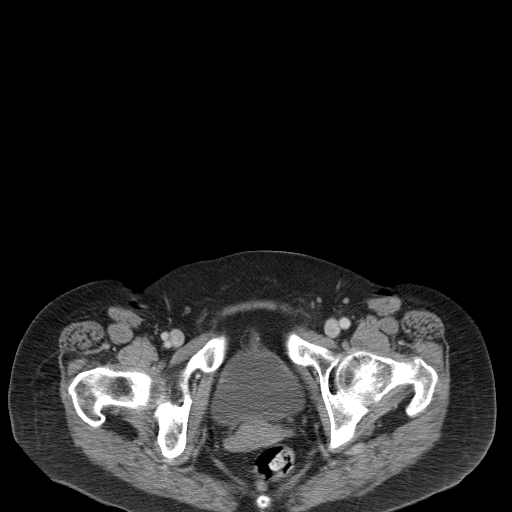
[im 18/82  soft-tissue]
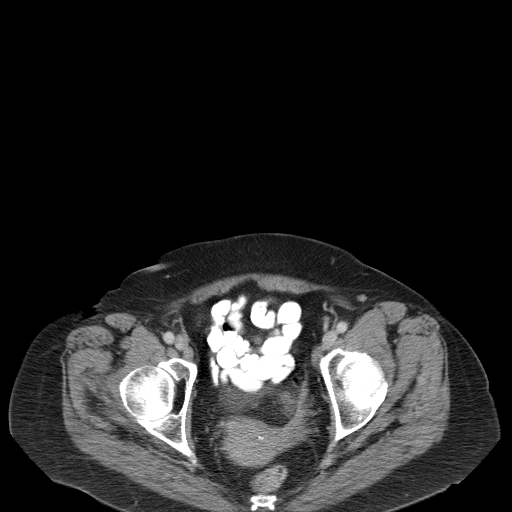
[im 22/82  soft-tissue]
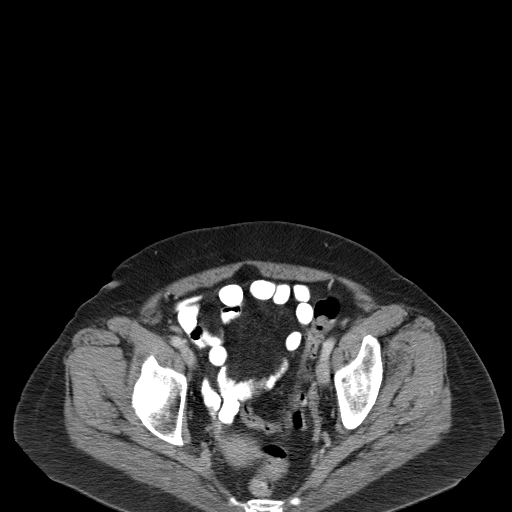
[im 30/82  soft-tissue]
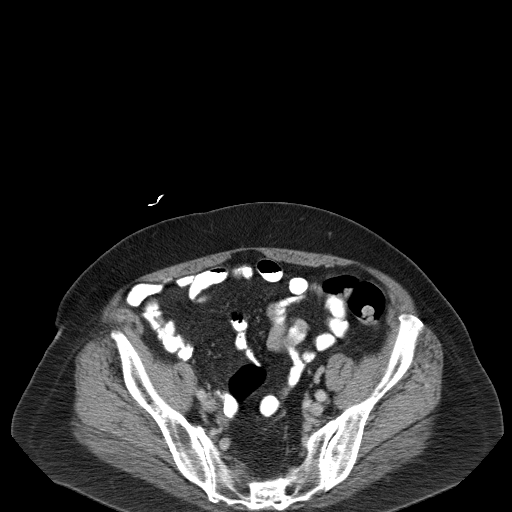
[im 35/82  soft-tissue]
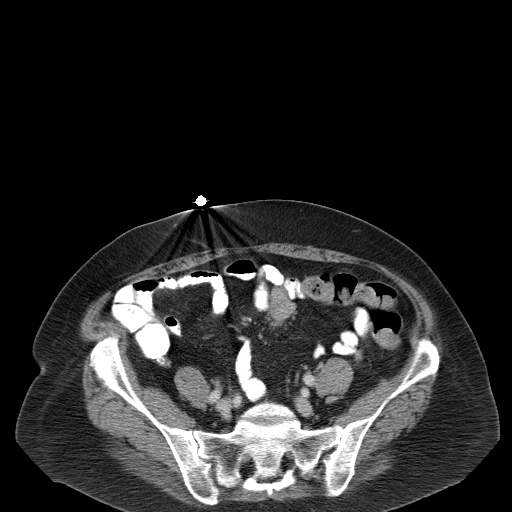
[im 43/82  soft-tissue]
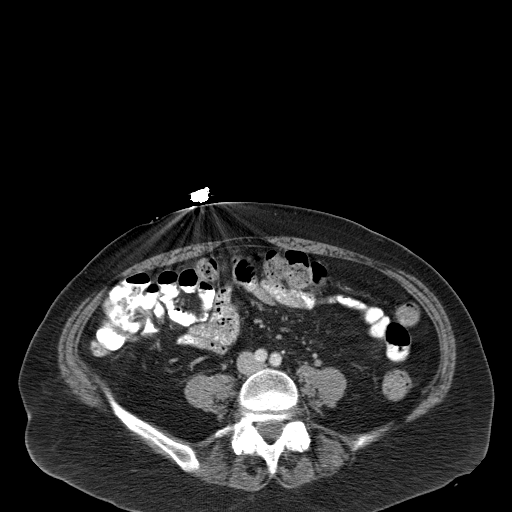
[im 47/82  soft-tissue]
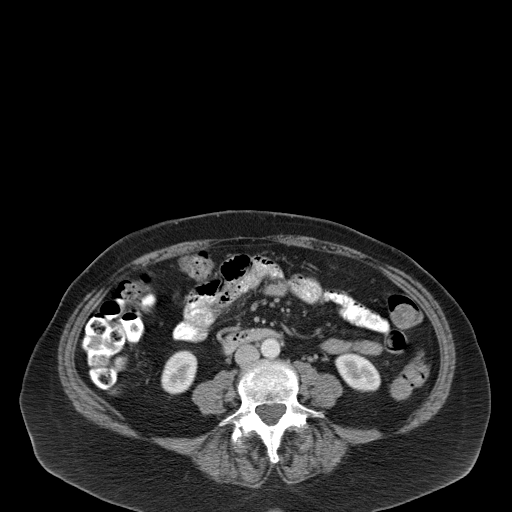
[im 52/82  soft-tissue]
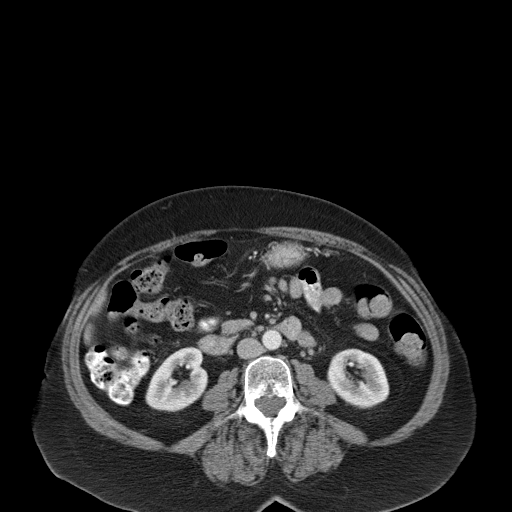
[im 52/82  bone]
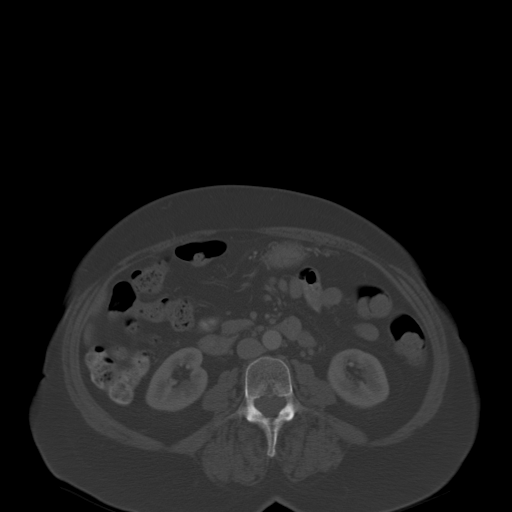
[im 60/82  soft-tissue]
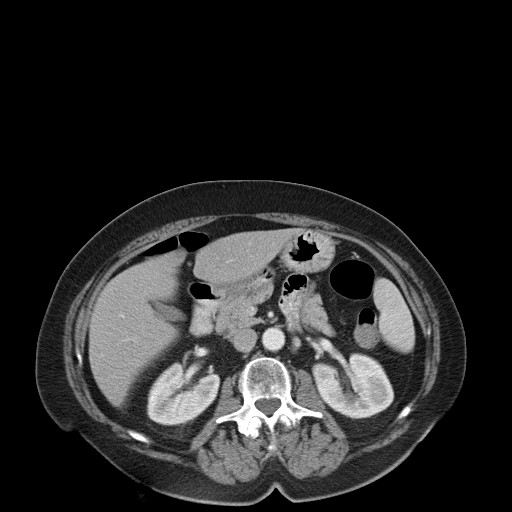
[im 64/82  soft-tissue]
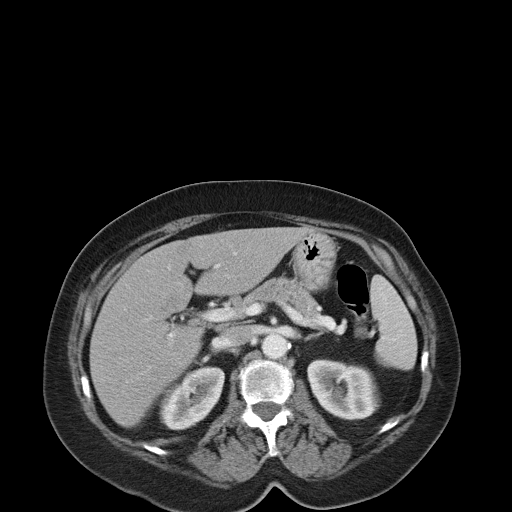
[im 69/82  soft-tissue]
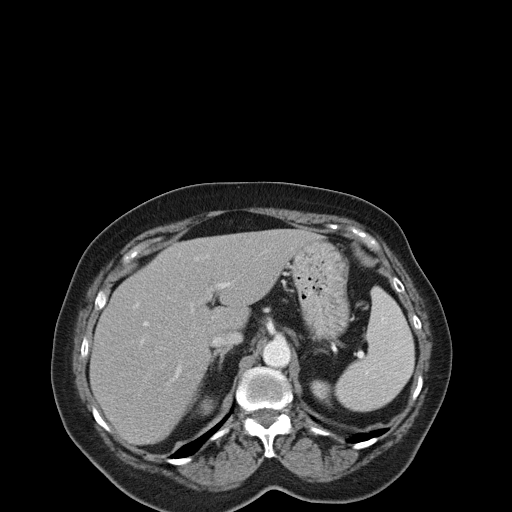
[im 77/82  soft-tissue]
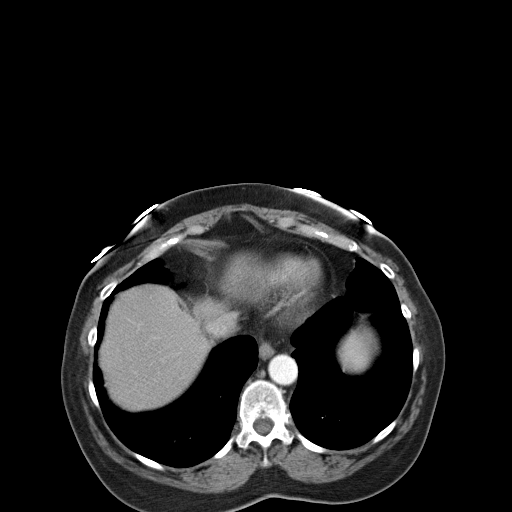

[Series 602: cor · coronal · 0.83mm/px · 3 of 82 slices shown]
[im 28/82  soft-tissue]
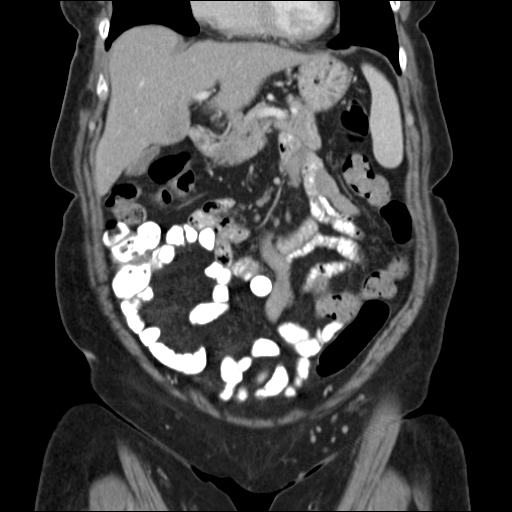
[im 37/82  soft-tissue]
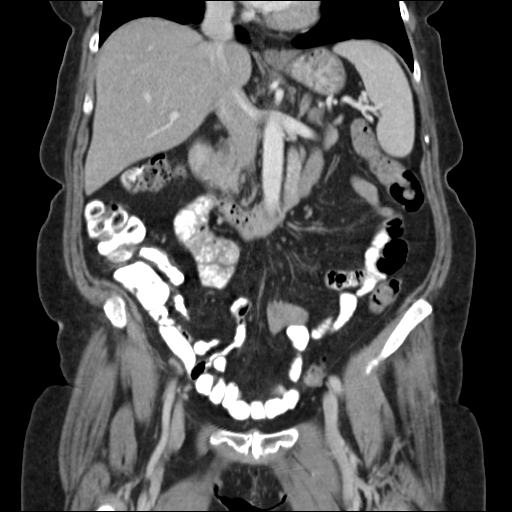
[im 46/82  soft-tissue]
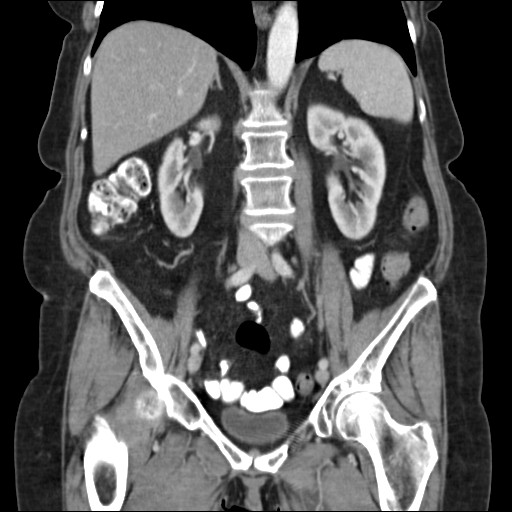

[16 of 46 positions shown; findings below may reference images not displayed]

FINDINGS: Normal contour.  There is minimal focal fatty infiltration adjacent
to the fissure for ligamentum teres and adjacent to the gallbladder
fossa.  No discrete hepatic lesions.  Normal appearance of the
gallbladder.  No intra or extrahepatic biliary duct dilatation.  No
ascites.

There is symmetric enhancement and excretion of the bilateral
kidneys.  No definite renal stones on the post contrast
examination.  No discrete renal lesions or evidence of urinary
obstruction.  Normal appearance of the bilateral adrenal glands,
pancreas and spleen.

Ingested enteric contrast extends to the level of the ascending
colon.  Moderate colonic stool burden without evidence of
obstruction.  Colonic diverticulosis.  There is mesenteric
stranding adjacent to a short segment of sigmoid colon within the
left hemi pelvis (images 62-64) suggestive of acute diverticulitis.
This finding is without associated drainable fluid collection.  No
pneumoperitoneum, pneumatosis or portal venous gas.

Normal caliber abdominal aorta.  The major branch vessels of the
abdominal aorta appear patent on this non CTA examination. A focal
eccentric calcification and the to involve the cranial  aspect of
the origin of the left renal artery.  No retroperitoneal,
mesenteric, pelvic or inguinal lymphadenopathy.

Normal appearance of the pelvic organs for age.  No discrete
adnexal lesion.  No free fluid in the pelvis.

Limited visualization of the lower thorax is negative for focal
airspace opacity or pleural effusion.  There is minimal
subsegmental atelectasis within the imaged right lower lobe.  No
focal airspace opacity.  Normal heart size.  No definite
pericardial effusion.

No acute or aggressive osseous abnormalities.  Bilateral mild to
moderate facet degenerative change within the lower lumbar spine,
worst at L4 - L5 and L5 - S1 bilaterally.  Minimal degenerative
change of the pubic symphysis.
IMPRESSION: Findings compatible with acute, uncomplicated diverticulitis
involving a short segment of sigmoid colon within the left hemi
pelvis.  No evidence of perforation or drainable fluid collection.
If not recently performed, further evaluation with direct
visualization (colonoscopy) after the resolution of acute symptoms
is recommended to exclude the presence of an underlying lesion.

## 2014-02-02 DIAGNOSIS — Z23 Encounter for immunization: Secondary | ICD-10-CM | POA: Diagnosis not present

## 2014-02-07 ENCOUNTER — Telehealth: Payer: Self-pay | Admitting: Gynecology

## 2014-02-07 NOTE — Telephone Encounter (Signed)
Left message regarding cancelled appointment.

## 2014-02-12 ENCOUNTER — Encounter: Payer: Self-pay | Admitting: Gynecology

## 2014-02-19 ENCOUNTER — Ambulatory Visit: Payer: BLUE CROSS/BLUE SHIELD | Admitting: Gynecology

## 2014-03-01 ENCOUNTER — Encounter: Payer: Self-pay | Admitting: Certified Nurse Midwife

## 2014-03-01 ENCOUNTER — Ambulatory Visit (INDEPENDENT_AMBULATORY_CARE_PROVIDER_SITE_OTHER): Payer: Medicare Other | Admitting: Certified Nurse Midwife

## 2014-03-01 VITALS — BP 124/72 | HR 72 | Resp 16 | Ht 65.75 in | Wt 178.0 lb

## 2014-03-01 DIAGNOSIS — Z01419 Encounter for gynecological examination (general) (routine) without abnormal findings: Secondary | ICD-10-CM | POA: Diagnosis not present

## 2014-03-01 DIAGNOSIS — N904 Leukoplakia of vulva: Secondary | ICD-10-CM | POA: Diagnosis not present

## 2014-03-01 MED ORDER — CLOBETASOL PROPIONATE 0.05 % EX OINT: TOPICAL_OINTMENT | CUTANEOUS | Status: DC

## 2014-03-01 NOTE — Progress Notes (Signed)
72 y.o. [id number] Widowed Caucasian Fe here for annual exam. Menopausal, no HRT. Denies vaginal bleeding or vaginal dryness. Patient sees PCP for Hypertension/cholesterol and hypothyroid management, labs and aex. Has regular eye exam and dental. Stays active with personal trainer at home and traveling. Last trip was to [place]! 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 Patient is out of her Clobetasol ointment. No other health issues today.  No LMP recorded. Patient is postmenopausal.  Age 43        Sexually active: No.  The current method of family planning is post menopausal status.    Exercising: Yes.    walk,trainer Smoker:  no  Health Maintenance: Pap: 12-25-08 neg MMG:  08-21-13 density category c, birads category 2:neg Colonoscopy:  2014 BMD:   2014 TDaP:  2007 Labs: none Self breast exam: not done   reports that she has never smoked. She has never used smokeless tobacco. She reports that she drinks about 2.4 oz of alcohol per week. She reports that she does not use illicit drugs.  Past Medical History  Diagnosis Date  . Arthritis     osteoarthritis  . Hyperthyroidism   . Hypercholesteremia   . GERD (gastroesophageal reflux disease)   . Dysplasia of cervix, low grade (CIN 1) 1990    HPV, Cryo, Laser  . Fibroid 2002    1 cm, 2 cm  . Fibroadenoma     Left, at 5 o'clock, not excised   . Hypertension     Past Surgical History  Procedure Laterality Date  . Tonsillectomy    . Cataract extraction      bilateral  . Colon polyp removal    . Thyroidectomy    . 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 Retinal detachment surgery  2000  . Breast biopsy Bilateral 1970's    x2 one a side    Current Outpatient Prescriptions  Medication Sig Dispense Refill  . aspirin 81 MG tablet Take 81 mg by mouth daily.    [name] CALCIUM PO Take by mouth.    . clobetasol  ointment (TEMOVATE) 0.05 % Pea size amount as needed to affected area 30 g 0  . esomeprazole (NEXIUM) 40 MG capsule Take 40 mg by mouth every morning.    . Ibuprofen (ADVIL PO) Take by mouth.    . levothyroxine (SYNTHROID, LEVOTHROID) 112 MCG tablet Take 112 mcg by mouth every morning.    [name] lisinopril (PRINIVIL,ZESTRIL) 20 MG tablet Take 20 mg by mouth every morning.    . metoprolol succinate (TOPROL-XL) 25 MG 24 hr tablet Take 25 mg by mouth at bedtime.    . simvastatin (ZOCOR) 40 MG tablet Take 40 mg by mouth at bedtime.    [name] zolpidem (AMBIEN) 5 MG tablet Take 5 mg by mouth at bedtime as needed.     No current facility-administered medications for this visit.    Family History  Problem Relation Age of Onset  . Aortic aneurysm Mother   . Breast cancer Maternal Aunt 11  . Breast cancer Maternal Grandmother   . Heart attack Maternal Grandmother     ROS:  Pertinent items are noted in HPI.  Otherwise, a comprehensive ROS was negative.  Exam:   BP 150/90 mmHg  Pulse 72  Resp 16  Ht 5' 5.75" (1.67 m)  Wt 178 lb (80.74 kg)  BMI 28.95 kg/m2  LMP  Height: 5' 5.75" (167 cm)  Ht Readings from Last 3 Encounters:  03/01/14 5' 5.75" (1.67 m)  02/13/13 5' 5.5" (1.664 m)  04/25/12 5\' 5"  (1.651 m)    General appearance: alert, cooperative and appears stated age Ambulates well without assistance Head: Normocephalic, without obvious abnormality, atraumatic Neck: no adenopathy, supple, symmetrical, trachea midline and thyroid normal to inspection and palpation Lungs: clear to auscultation bilaterally Breasts: normal appearance, no masses or tenderness, No nipple retraction or dimpling, No nipple discharge or bleeding, No axillary or supraclavicular adenopathy Heart: regular rate and rhythm Abdomen: soft, non-tender; no masses,  no organomegaly Extremities: extremities normal, atraumatic, no cyanosis or edema Skin: Skin color, texture, turgor normal. No rashes or lesions Lymph nodes:  Cervical, supraclavicular, and axillary nodes normal. No abnormal inguinal nodes palpated Neurologic: Grossly normal   Pelvic: External genitalia:  no lesions, loss of pigmentation with lace like effect noted on lower vulva area on right and left only. No scaling or other skin change              Urethra:  normal appearing urethra with no masses, tenderness or lesions              Bartholin's and Skene's: normal                 Vagina: normal appearing vagina with normal color and discharge, no lesions              Cervix: normal, non tender, no lesions              Pap taken: No. 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

## 2014-03-01 NOTE — Patient Instructions (Signed)

## 2014-03-02 NOTE — Progress Notes (Signed)
Reviewed personally.  M. Suzanne Keron Neenan, MD.  

## 2014-03-13 DIAGNOSIS — E039 Hypothyroidism, unspecified: Secondary | ICD-10-CM | POA: Diagnosis not present

## 2014-03-13 DIAGNOSIS — I10 Essential (primary) hypertension: Secondary | ICD-10-CM | POA: Diagnosis not present

## 2014-03-13 DIAGNOSIS — Z1389 Encounter for screening for other disorder: Secondary | ICD-10-CM | POA: Diagnosis not present

## 2014-03-13 DIAGNOSIS — E538 Deficiency of other specified B group vitamins: Secondary | ICD-10-CM | POA: Diagnosis not present

## 2014-03-13 DIAGNOSIS — Z6829 Body mass index (BMI) 29.0-29.9, adult: Secondary | ICD-10-CM | POA: Diagnosis not present

## 2014-03-13 DIAGNOSIS — E785 Hyperlipidemia, unspecified: Secondary | ICD-10-CM | POA: Diagnosis not present

## 2014-04-19 DIAGNOSIS — Z961 Presence of intraocular lens: Secondary | ICD-10-CM | POA: Diagnosis not present

## 2014-08-27 DIAGNOSIS — Z1231 Encounter for screening mammogram for malignant neoplasm of breast: Secondary | ICD-10-CM | POA: Diagnosis not present

## 2014-08-27 DIAGNOSIS — Z803 Family history of malignant neoplasm of breast: Secondary | ICD-10-CM | POA: Diagnosis not present

## 2014-08-27 DIAGNOSIS — M8580 Other specified disorders of bone density and structure, unspecified site: Secondary | ICD-10-CM | POA: Diagnosis not present

## 2014-09-04 DIAGNOSIS — L814 Other melanin hyperpigmentation: Secondary | ICD-10-CM | POA: Diagnosis not present

## 2014-09-04 DIAGNOSIS — D2371 Other benign neoplasm of skin of right lower limb, including hip: Secondary | ICD-10-CM | POA: Diagnosis not present

## 2014-09-04 DIAGNOSIS — L821 Other seborrheic keratosis: Secondary | ICD-10-CM | POA: Diagnosis not present

## 2014-09-04 DIAGNOSIS — L304 Erythema intertrigo: Secondary | ICD-10-CM | POA: Diagnosis not present

## 2014-09-04 DIAGNOSIS — D225 Melanocytic nevi of trunk: Secondary | ICD-10-CM | POA: Diagnosis not present

## 2014-09-07 DIAGNOSIS — E785 Hyperlipidemia, unspecified: Secondary | ICD-10-CM | POA: Diagnosis not present

## 2014-09-07 DIAGNOSIS — I10 Essential (primary) hypertension: Secondary | ICD-10-CM | POA: Diagnosis not present

## 2014-09-07 DIAGNOSIS — E039 Hypothyroidism, unspecified: Secondary | ICD-10-CM | POA: Diagnosis not present

## 2014-09-07 DIAGNOSIS — E538 Deficiency of other specified B group vitamins: Secondary | ICD-10-CM | POA: Diagnosis not present

## 2014-09-19 DIAGNOSIS — K219 Gastro-esophageal reflux disease without esophagitis: Secondary | ICD-10-CM | POA: Diagnosis not present

## 2014-09-19 DIAGNOSIS — Z0000 Encounter for general adult medical examination without abnormal findings: Secondary | ICD-10-CM | POA: Diagnosis not present

## 2014-09-19 DIAGNOSIS — I10 Essential (primary) hypertension: Secondary | ICD-10-CM | POA: Diagnosis not present

## 2014-09-19 DIAGNOSIS — Z6830 Body mass index (BMI) 30.0-30.9, adult: Secondary | ICD-10-CM | POA: Diagnosis not present

## 2014-09-19 DIAGNOSIS — M1990 Unspecified osteoarthritis, unspecified site: Secondary | ICD-10-CM | POA: Diagnosis not present

## 2014-09-19 DIAGNOSIS — D7589 Other specified diseases of blood and blood-forming organs: Secondary | ICD-10-CM | POA: Diagnosis not present

## 2014-09-19 DIAGNOSIS — E039 Hypothyroidism, unspecified: Secondary | ICD-10-CM | POA: Diagnosis not present

## 2014-09-19 DIAGNOSIS — Z1389 Encounter for screening for other disorder: Secondary | ICD-10-CM | POA: Diagnosis not present

## 2014-09-19 DIAGNOSIS — I6529 Occlusion and stenosis of unspecified carotid artery: Secondary | ICD-10-CM | POA: Diagnosis not present

## 2014-09-19 DIAGNOSIS — E538 Deficiency of other specified B group vitamins: Secondary | ICD-10-CM | POA: Diagnosis not present

## 2014-09-19 DIAGNOSIS — E785 Hyperlipidemia, unspecified: Secondary | ICD-10-CM | POA: Diagnosis not present

## 2014-09-19 DIAGNOSIS — D126 Benign neoplasm of colon, unspecified: Secondary | ICD-10-CM | POA: Diagnosis not present

## 2014-09-20 DIAGNOSIS — Z1212 Encounter for screening for malignant neoplasm of rectum: Secondary | ICD-10-CM | POA: Diagnosis not present

## 2014-09-25 ENCOUNTER — Telehealth: Payer: Self-pay | Admitting: Nurse Practitioner

## 2014-09-25 NOTE — Telephone Encounter (Signed)
Please let pt know that BMD done on 08/27/14 shows the T score at the spine to be -0.5, left hip neck -1.1; right hip neck -1.6.  She falls into the low normal range with the lowest at the right hip.    The comparison from 2014 shows an increase at the spine and no significant changes at the hips.    The FRAX score for 10 year risk of major fracture is 10.8 % (goal is <20%),  The FRAX score for hip fracture in 10 years is 2.0% (goal is < 3%).    While some bone loss is normal and expectant do want to see more loss.  She must continue with exercise, calcium, and Vit D.  Repeat BMD in 2 years.

## 2014-09-27 NOTE — Telephone Encounter (Signed)
Pt returned call.  Advised of results.  Pt is appreciative of call and results.  Pt agreeable to repeat exam in two years.

## 2014-09-27 NOTE — Telephone Encounter (Signed)
I have attempted to contact this patient by phone with the following results: left message to return my call on answering machine (home).  [phone] (H)

## 2015-01-10 DIAGNOSIS — I10 Essential (primary) hypertension: Secondary | ICD-10-CM | POA: Diagnosis not present

## 2015-01-10 DIAGNOSIS — H8110 Benign paroxysmal vertigo, unspecified ear: Secondary | ICD-10-CM | POA: Diagnosis not present

## 2015-02-07 DIAGNOSIS — E785 Hyperlipidemia, unspecified: Secondary | ICD-10-CM | POA: Diagnosis not present

## 2015-02-07 DIAGNOSIS — Z23 Encounter for immunization: Secondary | ICD-10-CM | POA: Diagnosis not present

## 2015-03-18 ENCOUNTER — Ambulatory Visit: Payer: BLUE CROSS/BLUE SHIELD | Admitting: Certified Nurse Midwife

## 2015-03-20 ENCOUNTER — Ambulatory Visit (INDEPENDENT_AMBULATORY_CARE_PROVIDER_SITE_OTHER): Payer: Medicare Other | Admitting: Certified Nurse Midwife

## 2015-03-20 ENCOUNTER — Encounter: Payer: Self-pay | Admitting: Certified Nurse Midwife

## 2015-03-20 VITALS — BP 122/78 | HR 70 | Resp 16 | Ht 65.25 in | Wt 182.0 lb

## 2015-03-20 DIAGNOSIS — Z01419 Encounter for gynecological examination (general) (routine) without abnormal findings: Secondary | ICD-10-CM

## 2015-03-20 DIAGNOSIS — N904 Leukoplakia of vulva: Secondary | ICD-10-CM

## 2015-03-20 DIAGNOSIS — B372 Candidiasis of skin and nail: Secondary | ICD-10-CM

## 2015-03-20 DIAGNOSIS — N8110 Cystocele, unspecified: Secondary | ICD-10-CM

## 2015-03-20 DIAGNOSIS — N952 Postmenopausal atrophic vaginitis: Secondary | ICD-10-CM

## 2015-03-20 DIAGNOSIS — N816 Rectocele: Secondary | ICD-10-CM

## 2015-03-20 DIAGNOSIS — Z124 Encounter for screening for malignant neoplasm of cervix: Secondary | ICD-10-CM | POA: Diagnosis not present

## 2015-03-20 MED ORDER — CLOBETASOL PROPIONATE 0.05 % EX OINT: TOPICAL_OINTMENT | CUTANEOUS | Status: DC

## 2015-03-20 NOTE — Patient Instructions (Addendum)
EXERCISE AND DIET:  We recommended that you start or continue a regular exercise program for good health. Regular exercise means any activity that makes your heart beat faster and makes you sweat.  We recommend exercising at least 30 minutes per day at least 3 days a week, preferably 4 or 5.  We also recommend a diet low in fat and sugar.  Inactivity, poor dietary choices and obesity can cause diabetes, heart attack, stroke, and kidney damage, among others.    ALCOHOL AND SMOKING:  Women should limit their alcohol intake to no more than 7 drinks/beers/glasses of wine (combined, not each!) per week. Moderation of alcohol intake to this level decreases your risk of breast cancer and liver damage. And of course, no recreational drugs are part of a healthy lifestyle.  And absolutely no smoking or even second hand smoke. Most people know smoking can cause heart and lung diseases, but did you know it also contributes to weakening of your bones? Aging of your skin?  Yellowing of your teeth and nails?  CALCIUM AND VITAMIN D:  Adequate intake of calcium and Vitamin D are recommended.  The recommendations for exact amounts of these supplements seem to change often, but generally speaking 600 mg of calcium (either carbonate or citrate) and 800 units of Vitamin D per day seems prudent. Certain women may benefit from higher intake of Vitamin D.  If you are among these women, your doctor will have told you during your visit.    PAP SMEARS:  Pap smears, to check for cervical cancer or precancers,  have traditionally been done yearly, although recent scientific advances have shown that most women can have pap smears less often.  However, every woman still should have a physical exam from her gynecologist every year. It will include a breast check, inspection of the vulva and vagina to check for abnormal growths or skin changes, a visual exam of the cervix, and then an exam to evaluate the size and shape of the uterus and  ovaries.  And after 73 years of age, a rectal exam is indicated to check for rectal cancers. We will also provide age appropriate advice regarding health maintenance, like when you should have certain vaccines, screening for sexually transmitted diseases, bone density testing, colonoscopy, mammograms, etc.   MAMMOGRAMS:  All women over 40 years old should have a yearly mammogram. Many facilities now offer a "3D" mammogram, which may cost around $50 extra out of pocket. If possible,  we recommend you accept the option to have the 3D mammogram performed.  It both reduces the number of women who will be called back for extra views which then turn out to be normal, and it is better than the routine mammogram at detecting truly abnormal areas.    COLONOSCOPY:  Colonoscopy to screen for colon cancer is recommended for all women at age 50.  We know, you hate the idea of the prep.  We agree, BUT, having colon cancer and not knowing it is worse!!  Colon cancer so often starts as a polyp that can be seen and removed at colonscopy, which can quite literally save your life!  And if your first colonoscopy is normal and you have no family history of colon cancer, most women don't have to have it again for 10 years.  Once every ten years, you can do something that may end up saving your life, right?  We will be happy to help you get it scheduled when you are ready.    Be sure to check your insurance coverage so you understand how much it will cost.  It may be covered as a preventative service at no cost, but you should check your particular policy.     Lichen Sclerosus Lichen sclerosus is a skin problem. It can happen on any part of the body, but it commonly involves the anal or genital areas. It can cause itching and discomfort in these areas. Treatment can help to control symptoms. When the genital area is affected, getting treatment is important because the condition can cause scarring that may lead to other  problems. CAUSES The cause of this condition is not known. It could be the result of an overactive immune system or a lack of certain hormones. Lichen sclerosus is not an infection or a fungus. It is not passed from one person to another (not contagious). RISK FACTORS This condition is more likely to develop in women, usually after menopause. SYMPTOMS Symptoms of this condition include:  Thin, wrinkled, white areas on the skin.  Thickened white areas on the skin.  Red and swollen patches (lesions) on the skin.  Tears or cracks in the skin.  Bruising.  Blood blisters.  Severe itching. You may also have pain, itching, or burning with urination. Constipation is also common in people with lichen sclerosus. DIAGNOSIS This condition may be diagnosed with a physical exam. In some cases, a tissue sample (biopsy sample) may be removed to be looked at under a microscope. TREATMENT This condition is usually treated with medicated creams or ointments (topical steroids) that are applied over the affected areas. HOME CARE INSTRUCTIONS  Take over-the-counter and prescription medicines only as told by your health care provider.  Use creams or ointments as told by your health care provider.  Do not scratch the affected areas of skin.  Women should keep the vaginal area as clean and dry as possible.  Keep all follow-up visits as told by your health care provider. This is important. SEEK MEDICAL CARE IF:  You have increasing redness, swelling, or pain in the affected area.  You have fluid, blood, or pus coming from the affected area.  You have new lesions on your skin.  You have pain or burning with urination.  You have pain during sex.   This information is not intended to replace advice given to you by your health care provider. Make sure you discuss any questions you have with your health care provider.   Document Released: 08/20/2010 Document Revised: 12/19/2014 Document Reviewed:  06/25/2014 Elsevier Interactive Patient Education [employer].

## 2015-03-20 NOTE — Progress Notes (Signed)
74 y.o. [id number] Widowed  Caucasian Fe here for annual exam.  Menopausal no HRT. Denies vaginal bleeding or vaginal dryness. Continues with walking for bone health and cholesterol. Sees PCP Dr. Joylene Draft for medication management for cholesterol, hypothyroid, and hypertension/ labs and aex. Dosage change on cholesterol and hypothyroid meds only. 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 Mycolog cream has worked well for occasional yeast under breast, uses prn, needs refill. Staying busy with family and going to the beach! No other health issues today.  No LMP recorded. Patient is postmenopausal.          Sexually active: No.  The current method of family planning is post menopausal status.    Exercising: Yes.    walking, trainer Smoker:  no  Health Maintenance: Pap:  12-25-08 neg. MMG: 08-27-14 category c density,birads 2:neg Colonoscopy: 2014 negative per patient BMD:   08-27-14 TDaP:  2007 Labs:pcp  Self breast exam: not done   reports that she has never smoked. She has never used smokeless tobacco. She reports that she drinks about 3.0 oz of alcohol per week. She reports that she does not use illicit drugs.  Past Medical History  Diagnosis Date  . Arthritis     osteoarthritis  . Hyperthyroidism   . Hypercholesteremia   . GERD (gastroesophageal reflux disease)   . Dysplasia of cervix, low grade (CIN 1) 1990    HPV, Cryo, Laser  . Fibroid 2002    1 cm, 2 cm  . Fibroadenoma     Left, at 5 o'clock, not excised   . Hypertension     Past Surgical History  Procedure Laterality Date  . Tonsillectomy    . Cataract extraction      bilateral  . Colon polyp removal    . Thyroidectomy    . 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 Retinal detachment surgery  2000  . Breast biopsy Bilateral 1970's    x2 one a side    Current  Outpatient Prescriptions  Medication Sig Dispense Refill  . amLODipine (NORVASC) 5 MG tablet TK 1 T PO  QD  11  . aspirin 81 MG tablet Take 81 mg by mouth daily.    [name] CALCIUM PO Take by mouth.    . clobetasol ointment (TEMOVATE) 0.05 % Pea size amount as needed to affected area 45 g 12  . esomeprazole (NEXIUM) 40 MG capsule Take 40 mg by mouth every morning.    . Ibuprofen (ADVIL PO) Take by mouth.    . levothyroxine (SYNTHROID, LEVOTHROID) 88 MCG tablet TK 1 T PO  D  2  . lisinopril (PRINIVIL,ZESTRIL) 20 MG tablet Take 20 mg by mouth every morning.    . metoprolol succinate (TOPROL-XL) 25 MG 24 hr tablet Take 25 mg by mouth at bedtime.    [name] nystatin-triamcinolone (MYCOLOG II) cream Apply 1 application topically 2 (two) times daily. Apply to affected area prn as needed for symptoms, do not exceed 7 day usage 60 g 1  . simvastatin (ZOCOR) 40 MG tablet Take 20 mg by mouth at bedtime.     [name] zolpidem (AMBIEN) 5 MG tablet Take 5 mg by mouth at bedtime as needed.     No current facility-administered medications for this visit.    Family History  Problem Relation Age of Onset  . Aortic aneurysm  Mother   . Breast cancer Maternal Aunt 56  . Breast cancer Maternal Grandmother   . Heart attack Maternal Grandmother   . Heart disease Brother     stints    ROS:  Pertinent items are noted in HPI.  Otherwise, a comprehensive ROS was negative.  Exam:   BP 122/78 mmHg  Pulse 70  Resp 16  Ht 5' 5.25" (1.657 m)  Wt 182 lb (82.555 kg)  BMI 30.07 kg/m2 Height: 5' 5.25" (165.7 cm) Ht Readings from Last 3 Encounters:  03/20/15 5' 5.25" (1.657 m)  03/01/14 5' 5.75" (1.67 m)  02/13/13 5' 5.5" (1.664 m)    General appearance: alert, cooperative and appears stated age Head: Normocephalic, without obvious abnormality, atraumatic Neck: no adenopathy, supple, symmetrical, trachea midline and thyroid normal to inspection and palpation Lungs: clear to auscultation bilaterally Breasts: normal  appearance, no masses or tenderness, No nipple retraction or dimpling, No nipple discharge or bleeding, No axillary or supraclavicular adenopathy Heart: regular rate and rhythm Abdomen: soft, non-tender; no masses,  no organomegaly Extremities: extremities normal, atraumatic, no cyanosis or edema Skin: Skin color, texture, turgor normal. No rashes or lesions Lymph nodes: Cervical, supraclavicular, and axillary nodes normal. No abnormal inguinal nodes palpated Neurologic: Grossly normal   Pelvic: External genitalia:  Normal female, atrophic appearance, small area of LS at clitoral crease on left( shown to patient with mirror)              Urethra:  normal appearing urethra with no masses, tenderness or lesions              Bartholin's and Skene's: normal                 Vagina: atrophic appearing vagina with normal color and scant moisture, no lesions, cystocele grade 1 and rectocele grade 2-3 noted, patient aware              Cervix: normal appearance, no lesions, stenotic os, non tender              Pap taken: No. 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 Discussed  use of OTC Coconut oil to help with changes. Patient feels"this is  great", instructions given for use. Will advise if problems. Questions addressed.  Rx Clobetasol see order  Discussed warning signs of progression with cystocele and rectocele and etiology. Questions addressed. Kegel exercise encouraged. Patient will advise if any concerns.  Rx Mycolog cream see order  Pap smear as above not taken, discussed guideline recommendations and can stop paps as she has done if no abnormals. Prefers no  pap today   counseled on breast self exam, mammography screening, feminine hygiene, adequate intake of calcium and vitamin D, diet and exercise, Kegel's exercises  return annually or prn  An After Visit Summary was printed and given to the patient.

## 2015-03-21 ENCOUNTER — Other Ambulatory Visit: Payer: Self-pay | Admitting: Certified Nurse Midwife

## 2015-03-21 DIAGNOSIS — B372 Candidiasis of skin and nail: Secondary | ICD-10-CM

## 2015-03-21 MED ORDER — NYSTATIN-TRIAMCINOLONE 100000-0.1 UNIT/GM-% EX CREA: 1.0000 "application " | TOPICAL_CREAM | Freq: Two times a day (BID) | CUTANEOUS | Status: DC

## 2015-03-22 ENCOUNTER — Telehealth: Payer: Self-pay

## 2015-03-22 NOTE — Telephone Encounter (Signed)
Left message to call [place] at [phone].  Need to speak with patient regarding PA request received for her Nystatin/Triamcinolone cream. Was patient able to fill this prescription with OOP cost? Does she need PA?

## 2015-03-25 MED ORDER — TRIAMCINOLONE ACETONIDE 0.1 % EX CREA: 1.0000 "application " | TOPICAL_CREAM | Freq: Two times a day (BID) | CUTANEOUS | Status: DC

## 2015-03-25 MED ORDER — NYSTATIN 100000 UNIT/GM EX CREA: 1.0000 "application " | TOPICAL_CREAM | Freq: Two times a day (BID) | CUTANEOUS | Status: DC

## 2015-03-25 NOTE — Telephone Encounter (Signed)
Spoke with patient. Advised patient that I have sent in two new prescriptions that are often covered with insurance at a lower cost. Advised these two creams together make up the same prescription that was sent in. Rx for Triamcinolone cream 0.1% and Nystatin Cream have been sent to the pharmacy on file. Advised she will need to mix the two creams in a 1:1 ratio. Will still apply the cream twice per day to the affected area and is not to use this more than 7 days. Patient is agreeable. She will return call if she has any problems getting these medications filled.  Routing to provider for final review. Patient agreeable to disposition. Will close encounter.

## 2015-03-25 NOTE — Telephone Encounter (Signed)
Patient wanted to let Marissa Powers know that she would like to have the forms filled out for the prescription.

## 2015-03-25 NOTE — Telephone Encounter (Signed)
Spoke with patient. Patient states she has been unable to fill her prescriptions with the pharmacy. Was advised that they did not have one of the prescriptions in stock. Patient is unsure which rx this is. Advised patient we received a PA request from the pharmacy for her Nystatin/Triamcinolone cream. Patient would like to check with the pharmacy regarding her prescriptions and return call to the office.

## 2015-03-27 NOTE — Progress Notes (Signed)
Reviewed personally.  M. Suzanne Azlin Zilberman, MD.  

## 2015-04-29 DIAGNOSIS — Z961 Presence of intraocular lens: Secondary | ICD-10-CM | POA: Diagnosis not present

## 2015-08-28 DIAGNOSIS — Z1231 Encounter for screening mammogram for malignant neoplasm of breast: Secondary | ICD-10-CM | POA: Diagnosis not present

## 2015-08-28 DIAGNOSIS — Z803 Family history of malignant neoplasm of breast: Secondary | ICD-10-CM | POA: Diagnosis not present

## 2015-08-30 DIAGNOSIS — H9202 Otalgia, left ear: Secondary | ICD-10-CM | POA: Diagnosis not present

## 2015-08-30 DIAGNOSIS — H60502 Unspecified acute noninfective otitis externa, left ear: Secondary | ICD-10-CM | POA: Diagnosis not present

## 2015-08-30 DIAGNOSIS — R42 Dizziness and giddiness: Secondary | ICD-10-CM | POA: Diagnosis not present

## 2015-08-30 DIAGNOSIS — Z6830 Body mass index (BMI) 30.0-30.9, adult: Secondary | ICD-10-CM | POA: Diagnosis not present

## 2015-08-30 DIAGNOSIS — H6122 Impacted cerumen, left ear: Secondary | ICD-10-CM | POA: Diagnosis not present

## 2015-09-03 DIAGNOSIS — L821 Other seborrheic keratosis: Secondary | ICD-10-CM | POA: Diagnosis not present

## 2015-09-03 DIAGNOSIS — D2272 Melanocytic nevi of left lower limb, including hip: Secondary | ICD-10-CM | POA: Diagnosis not present

## 2015-09-03 DIAGNOSIS — L820 Inflamed seborrheic keratosis: Secondary | ICD-10-CM | POA: Diagnosis not present

## 2015-09-03 DIAGNOSIS — L918 Other hypertrophic disorders of the skin: Secondary | ICD-10-CM | POA: Diagnosis not present

## 2015-09-03 DIAGNOSIS — D485 Neoplasm of uncertain behavior of skin: Secondary | ICD-10-CM | POA: Diagnosis not present

## 2015-09-12 ENCOUNTER — Encounter: Payer: Self-pay | Admitting: Certified Nurse Midwife

## 2016-01-14 DIAGNOSIS — Z23 Encounter for immunization: Secondary | ICD-10-CM | POA: Diagnosis not present

## 2016-01-17 DIAGNOSIS — E038 Other specified hypothyroidism: Secondary | ICD-10-CM | POA: Diagnosis not present

## 2016-01-17 DIAGNOSIS — E784 Other hyperlipidemia: Secondary | ICD-10-CM | POA: Diagnosis not present

## 2016-01-17 DIAGNOSIS — I10 Essential (primary) hypertension: Secondary | ICD-10-CM | POA: Diagnosis not present

## 2016-01-17 DIAGNOSIS — E538 Deficiency of other specified B group vitamins: Secondary | ICD-10-CM | POA: Diagnosis not present

## 2016-01-24 DIAGNOSIS — R7301 Impaired fasting glucose: Secondary | ICD-10-CM | POA: Diagnosis not present

## 2016-01-24 DIAGNOSIS — R42 Dizziness and giddiness: Secondary | ICD-10-CM | POA: Diagnosis not present

## 2016-01-24 DIAGNOSIS — Z6828 Body mass index (BMI) 28.0-28.9, adult: Secondary | ICD-10-CM | POA: Diagnosis not present

## 2016-01-24 DIAGNOSIS — M1990 Unspecified osteoarthritis, unspecified site: Secondary | ICD-10-CM | POA: Diagnosis not present

## 2016-01-24 DIAGNOSIS — H8113 Benign paroxysmal vertigo, bilateral: Secondary | ICD-10-CM | POA: Diagnosis not present

## 2016-01-24 DIAGNOSIS — N390 Urinary tract infection, site not specified: Secondary | ICD-10-CM | POA: Diagnosis not present

## 2016-01-24 DIAGNOSIS — Z0000 Encounter for general adult medical examination without abnormal findings: Secondary | ICD-10-CM | POA: Diagnosis not present

## 2016-01-24 DIAGNOSIS — I10 Essential (primary) hypertension: Secondary | ICD-10-CM | POA: Diagnosis not present

## 2016-01-24 DIAGNOSIS — Z1389 Encounter for screening for other disorder: Secondary | ICD-10-CM | POA: Diagnosis not present

## 2016-01-24 DIAGNOSIS — E038 Other specified hypothyroidism: Secondary | ICD-10-CM | POA: Diagnosis not present

## 2016-01-24 DIAGNOSIS — I6529 Occlusion and stenosis of unspecified carotid artery: Secondary | ICD-10-CM | POA: Diagnosis not present

## 2016-01-24 DIAGNOSIS — Z1212 Encounter for screening for malignant neoplasm of rectum: Secondary | ICD-10-CM | POA: Diagnosis not present

## 2016-01-24 DIAGNOSIS — E784 Other hyperlipidemia: Secondary | ICD-10-CM | POA: Diagnosis not present

## 2016-01-24 DIAGNOSIS — E538 Deficiency of other specified B group vitamins: Secondary | ICD-10-CM | POA: Diagnosis not present

## 2016-01-24 DIAGNOSIS — R8299 Other abnormal findings in urine: Secondary | ICD-10-CM | POA: Diagnosis not present

## 2016-03-09 ENCOUNTER — Ambulatory Visit: Payer: Medicare Other | Admitting: Physician Assistant

## 2016-03-09 ENCOUNTER — Ambulatory Visit: Payer: Self-pay | Admitting: Physician Assistant

## 2016-03-20 ENCOUNTER — Ambulatory Visit: Payer: Medicare Other | Admitting: Certified Nurse Midwife

## 2016-04-02 ENCOUNTER — Encounter: Payer: Self-pay | Admitting: Certified Nurse Midwife

## 2016-04-02 ENCOUNTER — Ambulatory Visit (INDEPENDENT_AMBULATORY_CARE_PROVIDER_SITE_OTHER): Payer: Medicare Other | Admitting: Certified Nurse Midwife

## 2016-04-02 VITALS — BP 122/74 | HR 70 | Resp 16 | Ht 65.25 in | Wt 174.0 lb

## 2016-04-02 DIAGNOSIS — Z01419 Encounter for gynecological examination (general) (routine) without abnormal findings: Secondary | ICD-10-CM | POA: Diagnosis not present

## 2016-04-02 DIAGNOSIS — N904 Leukoplakia of vulva: Secondary | ICD-10-CM | POA: Diagnosis not present

## 2016-04-02 DIAGNOSIS — Z124 Encounter for screening for malignant neoplasm of cervix: Secondary | ICD-10-CM | POA: Diagnosis not present

## 2016-04-02 DIAGNOSIS — L900 Lichen sclerosus et atrophicus: Secondary | ICD-10-CM | POA: Insufficient documentation

## 2016-04-02 NOTE — Progress Notes (Signed)
75 y.o. [id number] Widowed  Caucasian Fe here for annual exam. Menopausal no HRT, denies vaginal bleeding or vaginal dryness. Sees Dr. Joylene Draft yearly for labs, medication management of hypertension, hypothyroid and GERD. All medication stable at present per patient. Patient has noted increase vaginal stinging and wonders if she has vaginal infection or cut around rectal area. History of LS and uses Clobetasol when flares. Denies increase vaginal discharge or leaking. No new personal products. No other health issues today. Ambulating well, still driving. Recent trip to [place] with friends!   No LMP recorded. Patient is postmenopausal.          Sexually active: No.  The current method of family planning is post menopausal status.    Exercising: Yes.    walking Smoker:  no  Health Maintenance: Pap:  12-25-08 neg MMG:  08-28-15 category c density birads 1:neg Colonoscopy:  2014 neg per patient  BMD:   2016 TDaP:  2007, pt to check with pcp Shingles: 2013 Pneumonia: had done, Flu vaccine 10/17 Hep C and HIV: not done Labs: pcp Self breast exam:not done   reports that she has never smoked. She has never used smokeless tobacco. She reports that she drinks about 3.0 oz of alcohol per week . She reports that she does not use drugs.  Past Medical History:  Diagnosis Date  . Arthritis    osteoarthritis  . Dysplasia of cervix, low grade (CIN 1) 1990   HPV, Cryo, Laser  . 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 Fibroid 2002   1 cm, 2 cm  . GERD (gastroesophageal reflux disease)   . Hypercholesteremia   . Hypertension   . Hyperthyroidism     Past Surgical History:  Procedure Laterality Date  . BREAST BIOPSY Bilateral 1970's   x2 one a side  . CATARACT EXTRACTION     bilateral  . colon polyp removal    . RETINAL DETACHMENT SURGERY  2000  . THYROIDECTOMY    . TONSILLECTOMY    . TOTAL KNEE ARTHROPLASTY  04/25/2012   Procedure: TOTAL KNEE BILATERAL;  Surgeon: Gearlean Alf, MD;   Location: [facility];  Service: Orthopedics;  Laterality: Bilateral;    Current Outpatient Prescriptions  Medication Sig Dispense Refill  . amLODipine (NORVASC) 5 MG tablet TK 1 T PO  QD  11  . aspirin 81 MG tablet Take 81 mg by mouth daily.    [name] CALCIUM PO Take by mouth.    . clobetasol ointment (TEMOVATE) 0.05 % Pea size amount as needed to affected area 45 g 12  . esomeprazole (NEXIUM) 40 MG capsule Take 40 mg by mouth every morning.    . ezetimibe (ZETIA) 10 MG tablet     . Ibuprofen (ADVIL PO) Take by mouth.    . levothyroxine (SYNTHROID, LEVOTHROID) 88 MCG tablet TK 1 T PO  D  2  . lisinopril (PRINIVIL,ZESTRIL) 20 MG tablet Take 20 mg by mouth every morning.    . metoprolol succinate (TOPROL-XL) 25 MG 24 hr tablet Take 25 mg by mouth at bedtime.    [name] nystatin cream (MYCOSTATIN) Apply 1 application topically 2 (two) times daily. 30 g 0  . simvastatin (ZOCOR) 40 MG tablet Take 20 mg by mouth at bedtime.     . triamcinolone cream (KENALOG) 0.1 % Apply 1 application topically 2 (two) times daily. 30 g 0  . zolpidem (AMBIEN) 5 MG tablet Take 5 mg by mouth at bedtime as needed.  No current facility-administered medications for this visit.     Family History  Problem Relation Age of Onset  . Aortic aneurysm Mother   . Breast cancer Maternal Aunt 22  . Breast cancer Maternal Grandmother   . Heart attack Maternal Grandmother   . Heart disease Brother     stints    ROS:  Pertinent items are noted in HPI.  Otherwise, a comprehensive ROS was negative.  Exam:   BP 122/74   Pulse 70   Resp 16   Ht 5' 5.25" (1.657 m)   Wt 174 lb (78.9 kg)   BMI 28.73 kg/m  Height: 5' 5.25" (165.7 cm) Ht Readings from Last 3 Encounters:  04/02/16 5' 5.25" (1.657 m)  03/20/15 5' 5.25" (1.657 m)  03/01/14 5' 5.75" (1.67 m)    General appearance: alert, cooperative and appears stated age Head: Normocephalic, without obvious abnormality, atraumatic Neck: no adenopathy, supple, symmetrical, trachea  midline and thyroid normal to inspection and palpation Lungs: clear to auscultation bilaterally Breasts: normal appearance, no masses or tenderness, No nipple retraction or dimpling, No nipple discharge or bleeding, No axillary or supraclavicular adenopathy Heart: regular rate and rhythm Abdomen: soft, non-tender; no masses,  no organomegaly Extremities: extremities normal, atraumatic, no cyanosis or edema Skin: Skin color, texture, turgor normal. No rashes or lesions Lymph nodes: Cervical, supraclavicular, and axillary nodes normal. No abnormal inguinal nodes palpated Neurologic: Grossly normal   Pelvic: External genitalia:  Normal female with LS flare noted at bottom of vulva area bilateral and to anal area anterior only. Non tender, no bleeding noted. Slight cracking noted at anal area only.              Urethra:  normal appearing urethra with no masses, tenderness or lesions              Bartholin's and Skene's: normal                 Vagina: normal appearing vagina with normal color and discharge, no lesions              Cervix: no cervical motion tenderness and no lesions              Pap taken: Yes.   Bimanual Exam:  Uterus:  normal size, contour, position, consistency, mobility, non-tender              Adnexa: normal adnexa and no mass, fullness, tenderness               Rectovaginal: Confirms               Anus:  normal sphincter tone, no lesions  Chaperone present: yes  A:  Well Woman with normal exam  Menopausal no HRT  LS flare  Hypertension, GERD, Hypothyroid management with PCP  P:   Reviewed health and wellness pertinent to exam  Aware of need to advise if vaginal bleeding  Discussed finding and restart Clobetasol use once daily( her routine that works) for 2 weeks if needed. Risks of prolong use reviewed with thinning of tissue. Patient aware. Recheck if no change.  Continue follow up with PCP as indicated  Pap smear as above   counseled on breast self exam,  mammography screening, adequate intake of calcium and vitamin D, diet and exercise  return annually or prn  An After Visit Summary was printed and given to the patient.

## 2016-04-02 NOTE — Patient Instructions (Signed)

## 2016-04-02 NOTE — Progress Notes (Signed)
Encounter reviewed Fredonia Casalino, MD   

## 2016-04-07 LAB — IPS PAP SMEAR ONLY

## 2016-05-04 DIAGNOSIS — H5712 Ocular pain, left eye: Secondary | ICD-10-CM | POA: Diagnosis not present

## 2016-05-25 ENCOUNTER — Telehealth: Payer: Self-pay | Admitting: Certified Nurse Midwife

## 2016-05-25 NOTE — Telephone Encounter (Signed)
Spoke with patient. Patient states that for 3 weeks she has been having discomfort in her right breast. Denies any swelling, redness, or warmth to breast. Has not felt any masses on SBE. Is also having burning on her right side of her upper back. Unsure if this is related. Recommended OV for further evaluation. Patient is agreeable. Appointment scheduled for 05/27/2016 at 11 am with Melvia Heaps CNM. Patient is agreeable to date and time. Aware if symptoms worsen or develops new symptoms will need to be seen earlier for evaluation. Patient is agreeable.  Routing to provider for final review. Patient agreeable to disposition. Will close encounter.

## 2016-05-25 NOTE — Telephone Encounter (Signed)
Patient returning your call.

## 2016-05-25 NOTE — Telephone Encounter (Signed)
Left message to call Charnese Federici at [phone]. 

## 2016-05-25 NOTE — Telephone Encounter (Signed)
Patient is having some ongoing discomfort in her right breast.

## 2016-05-27 ENCOUNTER — Ambulatory Visit (INDEPENDENT_AMBULATORY_CARE_PROVIDER_SITE_OTHER): Payer: Medicare Other | Admitting: Certified Nurse Midwife

## 2016-05-27 ENCOUNTER — Encounter: Payer: Self-pay | Admitting: Certified Nurse Midwife

## 2016-05-27 VITALS — BP 114/70 | HR 72 | Resp 16 | Ht 65.25 in | Wt 177.0 lb

## 2016-05-27 DIAGNOSIS — N644 Mastodynia: Secondary | ICD-10-CM | POA: Diagnosis not present

## 2016-05-27 DIAGNOSIS — M25511 Pain in right shoulder: Secondary | ICD-10-CM | POA: Diagnosis not present

## 2016-05-27 NOTE — Progress Notes (Signed)
   Subjective:   76 y.o. Widowed Caucasian female presents for evaluation of discomfort under right breast. Noted skin change on scalpular area on right side. Talked to her dermatology friend MD and suggested a cream. Used the Analpram [facility] for area. Dr.Jordan gave Rx, no OV, no change in symptoms. Also complaining of discomfort in lower area of right breast for the past 2-3 weeks. No nipple discharge or skin change or mass noted. Does not do SBE Onset of the symptoms    Contributing factors include family hx on mother's side. . Patient denies hiistory of trauma, bites, or injuries. Last mammogram was 5/17.[name]   Patient exercises several times during the week and wonders if this could contribute. No bra change, but wearing loose one today.  No other health concerns Review of Systems Pertinent items noted in HPI and remainder of comprehensive ROS otherwise negative.   Objective:   @General  appearance: alert, cooperative, appears stated age and no distress  Back: slight kyphosis noted on right, but skin changes or redness or break in skin. Moderate touch with discomfort of slight burning. Breasts: normal appearance, no masses or tenderness, Inspection negative, No nipple retraction or dimpling, No nipple discharge or bleeding, No axillary or supraclavicular adenopathy, Right breast tenderness in lower mid quadrant at 6 o'clock, ? mass vs fibroglandular tissue, area of discomfort she has noted   Assessment:   ASSESSMENT:Patient is diagnosed with right breast tenderness ? mass vs fibrogalndular tissue  Arthralgia ? Of right scalpular region. Plan:  Discussed finding and need for evaluation. Patient agreeable. Patient will be scheduled for diagnostic 3 D mammogram prior to leaving there office. Discussed local therapy to scalpula area with ice and allow to warm up to see if this resolves the issues. Ice pack, no more than 10 minutes.  OTC Tylenol or Advil trial for relief no more than OTC directions, if  no change needs to see MD or consult with son who is Orthopedic. Patient agreeable.  Rv prn

## 2016-05-28 NOTE — Progress Notes (Signed)
Encounter reviewed Tehillah Cipriani, MD   

## 2016-06-11 DIAGNOSIS — N6459 Other signs and symptoms in breast: Secondary | ICD-10-CM | POA: Diagnosis not present

## 2016-06-18 DIAGNOSIS — Z961 Presence of intraocular lens: Secondary | ICD-10-CM | POA: Diagnosis not present

## 2016-06-18 DIAGNOSIS — H26491 Other secondary cataract, right eye: Secondary | ICD-10-CM | POA: Diagnosis not present

## 2016-06-24 DIAGNOSIS — H26491 Other secondary cataract, right eye: Secondary | ICD-10-CM | POA: Diagnosis not present

## 2016-06-25 ENCOUNTER — Telehealth: Payer: Self-pay

## 2016-06-25 NOTE — Telephone Encounter (Signed)
Left message to call [employer] at [phone].  Need to check on patient to see if right breast tenderness has resolved. Patient's mammogram on 06/11/2016 with [facility] was Birads 1: Negative. Melvia Heaps CNM recommendations breast recheck in office.

## 2016-07-24 NOTE — Telephone Encounter (Signed)
Spoke with patient. Patient states that around 10 days later breast pain resolved and has not returned since. Patient will return call to the office to schedule an appointment if breast pain reoccurs.  Routing to provider for final review. Patient agreeable to disposition. Will close encounter.

## 2016-07-24 NOTE — Telephone Encounter (Signed)
Left message to call Findley Vi at [phone]. 

## 2016-08-13 ENCOUNTER — Encounter: Payer: Self-pay | Admitting: Certified Nurse Midwife

## 2016-08-26 DIAGNOSIS — M8589 Other specified disorders of bone density and structure, multiple sites: Secondary | ICD-10-CM | POA: Diagnosis not present

## 2016-09-14 DIAGNOSIS — H60331 Swimmer's ear, right ear: Secondary | ICD-10-CM | POA: Diagnosis not present

## 2016-09-25 ENCOUNTER — Telehealth: Payer: Self-pay

## 2016-09-25 NOTE — Telephone Encounter (Signed)
Called pt to give her bmd results. Results to be scanned in. Pt notified of results.

## 2016-10-26 ENCOUNTER — Other Ambulatory Visit: Payer: Self-pay | Admitting: Certified Nurse Midwife

## 2016-10-26 NOTE — Telephone Encounter (Signed)
Medication refill request: nystatin cream  Last AEX:  04/02/16 DL Next AEX: 04/27/17 DL Last MMG (if hormonal medication request): 06/11/16 [place] Right [id number]:neg  Refill authorized: 03/25/15 #[id number]. Today please advise.   Routed to Dr. Quincy Simmonds

## 2016-10-26 NOTE — Telephone Encounter (Signed)
I reviewed her chart she has used this prn for yeast under breast and I have refilled this before. If this is the problem I am agreeable to refill

## 2016-10-26 NOTE — Telephone Encounter (Signed)
Please reach out to patient regarding her Rx request for Mycostatin.  She may need an appointment with Debbie.

## 2016-10-26 NOTE — Telephone Encounter (Signed)
Left voice mail to call back 

## 2016-10-26 NOTE — Telephone Encounter (Signed)
patient calling back

## 2016-10-27 MED ORDER — NYSTATIN 100000 UNIT/GM EX CREA: 1.0000 "application " | TOPICAL_CREAM | Freq: Two times a day (BID) | CUTANEOUS | 0 refills | Status: DC

## 2016-10-27 NOTE — Telephone Encounter (Signed)
Spoke with patient. Patient states she is using the nystatin cream under her breast for yeast prn. Advised patient RX for Nystatin sent to [facility] on file. Patient verbalizes understanding and is agreeable.   Patient is agreeable to disposition. Will close encounter.

## 2016-11-20 DIAGNOSIS — D225 Melanocytic nevi of trunk: Secondary | ICD-10-CM | POA: Diagnosis not present

## 2016-11-20 DIAGNOSIS — L821 Other seborrheic keratosis: Secondary | ICD-10-CM | POA: Diagnosis not present

## 2016-11-20 DIAGNOSIS — D2371 Other benign neoplasm of skin of right lower limb, including hip: Secondary | ICD-10-CM | POA: Diagnosis not present

## 2017-01-27 DIAGNOSIS — Z23 Encounter for immunization: Secondary | ICD-10-CM | POA: Diagnosis not present

## 2017-02-01 DIAGNOSIS — I10 Essential (primary) hypertension: Secondary | ICD-10-CM | POA: Diagnosis not present

## 2017-02-01 DIAGNOSIS — R000 Tachycardia, unspecified: Secondary | ICD-10-CM | POA: Diagnosis not present

## 2017-02-01 DIAGNOSIS — Z6830 Body mass index (BMI) 30.0-30.9, adult: Secondary | ICD-10-CM | POA: Diagnosis not present

## 2017-03-10 DIAGNOSIS — E538 Deficiency of other specified B group vitamins: Secondary | ICD-10-CM | POA: Diagnosis not present

## 2017-03-10 DIAGNOSIS — I10 Essential (primary) hypertension: Secondary | ICD-10-CM | POA: Diagnosis not present

## 2017-03-10 DIAGNOSIS — E7849 Other hyperlipidemia: Secondary | ICD-10-CM | POA: Diagnosis not present

## 2017-03-10 DIAGNOSIS — E038 Other specified hypothyroidism: Secondary | ICD-10-CM | POA: Diagnosis not present

## 2017-03-10 DIAGNOSIS — M859 Disorder of bone density and structure, unspecified: Secondary | ICD-10-CM | POA: Diagnosis not present

## 2017-03-10 DIAGNOSIS — R7301 Impaired fasting glucose: Secondary | ICD-10-CM | POA: Diagnosis not present

## 2017-03-17 ENCOUNTER — Telehealth: Payer: Self-pay

## 2017-03-17 DIAGNOSIS — Z6830 Body mass index (BMI) 30.0-30.9, adult: Secondary | ICD-10-CM | POA: Diagnosis not present

## 2017-03-17 DIAGNOSIS — I10 Essential (primary) hypertension: Secondary | ICD-10-CM | POA: Diagnosis not present

## 2017-03-17 DIAGNOSIS — R000 Tachycardia, unspecified: Secondary | ICD-10-CM | POA: Diagnosis not present

## 2017-03-17 DIAGNOSIS — E7849 Other hyperlipidemia: Secondary | ICD-10-CM | POA: Diagnosis not present

## 2017-03-17 DIAGNOSIS — Z1389 Encounter for screening for other disorder: Secondary | ICD-10-CM | POA: Diagnosis not present

## 2017-03-17 DIAGNOSIS — R82998 Other abnormal findings in urine: Secondary | ICD-10-CM | POA: Diagnosis not present

## 2017-03-17 DIAGNOSIS — Z0000 Encounter for general adult medical examination without abnormal findings: Secondary | ICD-10-CM | POA: Diagnosis not present

## 2017-03-17 DIAGNOSIS — R0789 Other chest pain: Secondary | ICD-10-CM | POA: Diagnosis not present

## 2017-03-17 DIAGNOSIS — M859 Disorder of bone density and structure, unspecified: Secondary | ICD-10-CM | POA: Diagnosis not present

## 2017-03-17 DIAGNOSIS — R808 Other proteinuria: Secondary | ICD-10-CM | POA: Diagnosis not present

## 2017-03-17 DIAGNOSIS — I6529 Occlusion and stenosis of unspecified carotid artery: Secondary | ICD-10-CM | POA: Diagnosis not present

## 2017-03-17 DIAGNOSIS — E038 Other specified hypothyroidism: Secondary | ICD-10-CM | POA: Diagnosis not present

## 2017-03-17 DIAGNOSIS — R011 Cardiac murmur, unspecified: Secondary | ICD-10-CM | POA: Insufficient documentation

## 2017-03-17 DIAGNOSIS — R7301 Impaired fasting glucose: Secondary | ICD-10-CM | POA: Diagnosis not present

## 2017-03-17 NOTE — Telephone Encounter (Signed)
SENT NOTES TO SCHEDULING 

## 2017-03-19 DIAGNOSIS — Z1212 Encounter for screening for malignant neoplasm of rectum: Secondary | ICD-10-CM | POA: Diagnosis not present

## 2017-04-27 ENCOUNTER — Encounter: Payer: Self-pay | Admitting: Certified Nurse Midwife

## 2017-04-27 ENCOUNTER — Ambulatory Visit (INDEPENDENT_AMBULATORY_CARE_PROVIDER_SITE_OTHER): Payer: Medicare Other | Admitting: Certified Nurse Midwife

## 2017-04-27 ENCOUNTER — Other Ambulatory Visit: Payer: Self-pay

## 2017-04-27 VITALS — BP 118/70 | HR 60 | Resp 16 | Ht 65.25 in | Wt 185.0 lb

## 2017-04-27 DIAGNOSIS — Z01419 Encounter for gynecological examination (general) (routine) without abnormal findings: Secondary | ICD-10-CM

## 2017-04-27 DIAGNOSIS — N904 Leukoplakia of vulva: Secondary | ICD-10-CM

## 2017-04-27 DIAGNOSIS — Z780 Asymptomatic menopausal state: Secondary | ICD-10-CM

## 2017-04-27 DIAGNOSIS — Z124 Encounter for screening for malignant neoplasm of cervix: Secondary | ICD-10-CM | POA: Diagnosis not present

## 2017-04-27 MED ORDER — CLOBETASOL PROPIONATE 0.05 % EX OINT: TOPICAL_OINTMENT | CUTANEOUS | 12 refills | Status: DC

## 2017-04-27 NOTE — Patient Instructions (Signed)
EXERCISE AND DIET:  We recommended that you start or continue a regular exercise program for good health. Regular exercise means any activity that makes your heart beat faster and makes you sweat.  We recommend exercising at least 30 minutes per day at least 3 days a week, preferably 4 or 5.  We also recommend a diet low in fat and sugar.  Inactivity, poor dietary choices and obesity can cause diabetes, heart attack, stroke, and kidney damage, among others.    ALCOHOL AND SMOKING:  Women should limit their alcohol intake to no more than 7 drinks/beers/glasses of wine (combined, not each!) per week. Moderation of alcohol intake to this level decreases your risk of breast cancer and liver damage. And of course, no recreational drugs are part of a healthy lifestyle.  And absolutely no smoking or even second hand smoke. Most people know smoking can cause heart and lung diseases, but did you know it also contributes to weakening of your bones? Aging of your skin?  Yellowing of your teeth and nails?  CALCIUM AND VITAMIN D:  Adequate intake of calcium and Vitamin D are recommended.  The recommendations for exact amounts of these supplements seem to change often, but generally speaking 600 mg of calcium (either carbonate or citrate) and 800 units of Vitamin D per day seems prudent. Certain women may benefit from higher intake of Vitamin D.  If you are among these women, your doctor will have told you during your visit.    PAP SMEARS:  Pap smears, to check for cervical cancer or precancers,  have traditionally been done yearly, although recent scientific advances have shown that most women can have pap smears less often.  However, every woman still should have a physical exam from her gynecologist every year. It will include a breast check, inspection of the vulva and vagina to check for abnormal growths or skin changes, a visual exam of the cervix, and then an exam to evaluate the size and shape of the uterus and  ovaries.  And after 76 years of age, a rectal exam is indicated to check for rectal cancers. We will also provide age appropriate advice regarding health maintenance, like when you should have certain vaccines, screening for sexually transmitted diseases, bone density testing, colonoscopy, mammograms, etc.   MAMMOGRAMS:  All women over 40 years old should have a yearly mammogram. Many facilities now offer a "3D" mammogram, which may cost around $50 extra out of pocket. If possible,  we recommend you accept the option to have the 3D mammogram performed.  It both reduces the number of women who will be called back for extra views which then turn out to be normal, and it is better than the routine mammogram at detecting truly abnormal areas.    COLONOSCOPY:  Colonoscopy to screen for colon cancer is recommended for all women at age 50.  We know, you hate the idea of the prep.  We agree, BUT, having colon cancer and not knowing it is worse!!  Colon cancer so often starts as a polyp that can be seen and removed at colonscopy, which can quite literally save your life!  And if your first colonoscopy is normal and you have no family history of colon cancer, most women don't have to have it again for 10 years.  Once every ten years, you can do something that may end up saving your life, right?  We will be happy to help you get it scheduled when you are ready.    Be sure to check your insurance coverage so you understand how much it will cost.  It may be covered as a preventative service at no cost, but you should check your particular policy.      Lichen Sclerosus Lichen sclerosus is a skin problem. It can happen on any part of the body. It happens most often in the anal or genital areas. It can cause itching and discomfort. Treatment can help to control symptoms. This skin problem is not passed from one person to another (not contagious). The cause is not known. Follow these instructions at home:  Take  over-the-counter and prescription medicines only as told by your doctor.  Use creams or ointments as told by your doctor.  Do not scratch the affected areas of skin.  Women should keep the vagina as clean and dry as they can.  Keep all follow-up visits as told by your doctor. This is important. Contact a doctor if:  Your redness, swelling, or pain gets worse.  You have fluid, blood, or pus coming from the area.  You have new patches (lesions) on your skin.  You have a fever.  You have pain during sex. This information is not intended to replace advice given to you by your health care provider. Make sure you discuss any questions you have with your health care provider. Document Released: 03/12/2008 Document Revised: 09/05/2015 Document Reviewed: 06/25/2014 Elsevier Interactive Patient Education  [employer].
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
## 2017-06-03 DIAGNOSIS — K219 Gastro-esophageal reflux disease without esophagitis: Secondary | ICD-10-CM | POA: Diagnosis not present

## 2017-06-03 DIAGNOSIS — Z86010 Personal history of colonic polyps: Secondary | ICD-10-CM | POA: Diagnosis not present

## 2017-06-16 ENCOUNTER — Encounter: Payer: Self-pay | Admitting: Certified Nurse Midwife

## 2017-06-16 DIAGNOSIS — Z1231 Encounter for screening mammogram for malignant neoplasm of breast: Secondary | ICD-10-CM | POA: Diagnosis not present

## 2017-06-25 DIAGNOSIS — H524 Presbyopia: Secondary | ICD-10-CM | POA: Diagnosis not present

## 2017-06-25 DIAGNOSIS — H52203 Unspecified astigmatism, bilateral: Secondary | ICD-10-CM | POA: Diagnosis not present

## 2017-06-25 DIAGNOSIS — Z961 Presence of intraocular lens: Secondary | ICD-10-CM | POA: Diagnosis not present

## 2017-06-25 DIAGNOSIS — H538 Other visual disturbances: Secondary | ICD-10-CM | POA: Diagnosis not present

## 2017-06-25 DIAGNOSIS — H5789 Other specified disorders of eye and adnexa: Secondary | ICD-10-CM | POA: Diagnosis not present

## 2017-08-10 ENCOUNTER — Encounter (HOSPITAL_COMMUNITY): Payer: Self-pay | Admitting: Nurse Practitioner

## 2017-08-10 ENCOUNTER — Ambulatory Visit (HOSPITAL_COMMUNITY)
Admission: RE | Admit: 2017-08-10 | Discharge: 2017-08-10 | Disposition: A | Discharge status: Home / Self Care | Payer: Medicare Other | Source: Ambulatory Visit | Attending: Nurse Practitioner | Admitting: Nurse Practitioner

## 2017-08-10 VITALS — BP 134/72 | HR 58 | Ht 65.25 in | Wt 189.0 lb

## 2017-08-10 DIAGNOSIS — I479 Paroxysmal tachycardia, unspecified: Secondary | ICD-10-CM

## 2017-08-10 DIAGNOSIS — E7800 Pure hypercholesterolemia, unspecified: Secondary | ICD-10-CM | POA: Diagnosis not present

## 2017-08-10 DIAGNOSIS — K219 Gastro-esophageal reflux disease without esophagitis: Secondary | ICD-10-CM | POA: Diagnosis not present

## 2017-08-10 DIAGNOSIS — I4891 Unspecified atrial fibrillation: Secondary | ICD-10-CM | POA: Diagnosis not present

## 2017-08-10 DIAGNOSIS — I10 Essential (primary) hypertension: Secondary | ICD-10-CM | POA: Diagnosis not present

## 2017-08-10 DIAGNOSIS — E039 Hypothyroidism, unspecified: Secondary | ICD-10-CM | POA: Insufficient documentation

## 2017-08-10 DIAGNOSIS — Z79899 Other long term (current) drug therapy: Secondary | ICD-10-CM | POA: Insufficient documentation

## 2017-08-10 DIAGNOSIS — Z7982 Long term (current) use of aspirin: Secondary | ICD-10-CM | POA: Diagnosis not present

## 2017-08-10 DIAGNOSIS — Z79890 Hormone replacement therapy (postmenopausal): Secondary | ICD-10-CM | POA: Diagnosis not present

## 2017-08-10 DIAGNOSIS — E0590 Thyrotoxicosis, unspecified without thyrotoxic crisis or storm: Secondary | ICD-10-CM | POA: Insufficient documentation

## 2017-08-10 DIAGNOSIS — R001 Bradycardia, unspecified: Secondary | ICD-10-CM | POA: Diagnosis not present

## 2017-08-10 DIAGNOSIS — M1990 Unspecified osteoarthritis, unspecified site: Secondary | ICD-10-CM | POA: Insufficient documentation

## 2017-08-10 NOTE — Patient Instructions (Signed)
LINQ implant --- come to the main entrance of the hospital and check in with admitting at 7am.

## 2017-08-10 NOTE — H&P (View-Only) (Signed)
Primary Care Physician: Crist Infante, MD Referring Physician: Dr. Tonita Cong Marissa Powers is a 77 y.o. female with a h/o palpitations, htn, hypothyroidism.She saw Dr. Joylene Draft, 4/29, for her annual physical and mentioned rapid heart beat in the middle of the night, which sounded suspicious  for afib and referred here for further evaluation. The pt brings in recorded  V/S, where she had an episode for around 4 hours with HR's in the 100-120 bpm range in March. She went around 4-6  weeks before she had similar symptoms. She states that she may snore, but she is a widow so no one in the house to report apnea. She reports that she sleeps well and does not have daytime somnolence. She does drink alcohol several times a week, 1-2 drinks at a time. She has been on metoprolol for years for palpitations and will usually take an extra 1/2 tab of metoprolol when she has an episode. She takes a baby ASA a day. Chadsvasc score is at least 4. No tobacco use, minimal caffeine use. She is in SR today. Her son is a Secondary school teacher in [place].  Today, she denies symptoms of palpitations, chest pain, shortness of breath, orthopnea, PND, lower extremity edema, dizziness, presyncope, syncope, or neurologic sequela. The patient is tolerating medications without difficulties and is otherwise without complaint today.   Past Medical History:  Diagnosis Date  . Arthritis    osteoarthritis  . Dysplasia of cervix, low grade (CIN 1) 1990   HPV, Cryo, Laser  . 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 Fibroid 2002   1 cm, 2 cm  . GERD (gastroesophageal reflux disease)   . Hypercholesteremia   . Hypertension   . Hyperthyroidism    Past Surgical History:  Procedure Laterality Date  . BREAST BIOPSY Bilateral 1970's   x2 one a side  . CATARACT EXTRACTION     bilateral  . colon polyp removal    . RETINAL DETACHMENT SURGERY  2000  . THYROIDECTOMY    . TONSILLECTOMY    . TOTAL KNEE ARTHROPLASTY  04/25/2012   Procedure: TOTAL KNEE BILATERAL;  Surgeon: Gearlean Alf, MD;  Location: [facility];  Service: Orthopedics;  Laterality: Bilateral;    Current Outpatient Medications  Medication Sig Dispense Refill  . aspirin 81 MG tablet Take 81 mg by mouth at bedtime.     [name] CALCIUM PO Take 1 tablet by mouth daily.     . clobetasol ointment (TEMOVATE) 0.05 % Pea size amount as needed to affected area (Patient taking differently: Apply 1 application topically daily as needed (rash). Pea size amount as needed to affected area) 45 g 12  . ezetimibe (ZETIA) 10 MG tablet Take 10 mg by mouth daily.     [name] lisinopril (PRINIVIL,ZESTRIL) 20 MG tablet Take 20 mg by mouth every morning.    . metoprolol succinate (TOPROL-XL) 25 MG 24 hr tablet Take 25 mg by mouth every morning.     . zolpidem (AMBIEN) 5 MG tablet Take 5 mg by mouth at bedtime as needed for sleep.     [name] amLODipine (NORVASC) 5 MG tablet Take 5 mg by mouth at bedtime.    . Cholecalciferol (VITAMIN D PO) Take 1 tablet by mouth daily.    . Cyanocobalamin (B-12 PO) Take 1 tablet by mouth daily.    [name] esomeprazole (NEXIUM) 20 MG capsule Take 40 mg by mouth at bedtime.    [name] ibuprofen (ADVIL,MOTRIN) 200 MG  tablet Take 400 mg by mouth daily as needed for mild pain.    [name] levothyroxine (SYNTHROID, LEVOTHROID) 88 MCG tablet Take 88 mcg by mouth daily before breakfast.    . simvastatin (ZOCOR) 40 MG tablet Take 20 mg by mouth at bedtime.     No current facility-administered medications for this encounter.     Allergies  Allergen Reactions  . Cashew Nut Oil Other (See Comments)    Tingley, swelling    Social History   Socioeconomic History  . Marital status: Widowed    Spouse name: Not on file  . Number of children: Not on file  . Years of education: Not on file  . Highest education level: Not on file  Occupational History  . Not on file  Social Needs  . Financial resource strain: Not on file  . Food insecurity:    Worry: Not on file    Inability: Not  on file  . Transportation needs:    Medical: Not on file    Non-medical: Not on file  Tobacco Use  . Smoking status: Never Smoker  . Smokeless tobacco: Never Used  Substance and Sexual Activity  . Alcohol use: Yes    Alcohol/week: 2.4 - 3.6 oz    Types: 4 - 6 Standard drinks or equivalent per week  . Drug use: No  . Sexual activity: Never    Partners: Male  Lifestyle  . Physical activity:    Days per week: Not on file    Minutes per session: Not on file  . Stress: Not on file  Relationships  . Social connections:    Talks on phone: Not on file    Gets together: Not on file    Attends religious service: Not on file    Active member of club or organization: Not on file    Attends meetings of clubs or organizations: Not on file    Relationship status: Not on file  . Intimate partner violence:    Fear of current or ex partner: Not on file    Emotionally abused: Not on file    Physically abused: Not on file    Forced sexual activity: Not on file  Other Topics Concern  . Not on file  Social History Narrative  . Not on file    Family History  Problem Relation Age of Onset  . Breast cancer Maternal Grandmother   . Heart attack Maternal Grandmother   . Aortic aneurysm Mother   . Breast cancer Maternal Aunt 20  . Heart disease Brother        stints    ROS- All systems are reviewed and negative except as per the HPI above  Physical Exam: Vitals:   08/10/17 1404  BP: 134/72  Pulse: (!) 58  Weight: 189 lb (85.7 kg)  Height: 5' 5.25" (1.657 m)   Wt Readings from Last 3 Encounters:  08/10/17 189 lb (85.7 kg)  04/27/17 185 lb (83.9 kg)  05/27/16 177 lb (80.3 kg)    Labs: Lab Results  Component Value Date   NA 136 04/27/2012   K 3.8 04/27/2012   CL 104 04/27/2012   CO2 23 04/27/2012   GLUCOSE 137 (H) 04/27/2012   BUN 6 04/27/2012   CREATININE 0.56 04/27/2012   CALCIUM 8.8 04/27/2012   Lab Results  Component Value Date   INR 1.77 (H) 04/29/2012   No  results found for: CHOL, HDL, LDLCALC, TRIG   GEN- The patient is well appearing, alert  and oriented x 3 today.   Head- normocephalic, atraumatic Eyes-  Sclera clear, conjunctiva pink Ears- hearing intact Oropharynx- clear Neck- supple, no JVP Lymph- no cervical lymphadenopathy Lungs- Clear to ausculation bilaterally, normal work of breathing Heart- Regular rate and rhythm, no murmurs, rubs or gallops, PMI not laterally displaced GI- soft, NT, ND, + BS Extremities- no clubbing, cyanosis, or edema MS- no significant deformity or atrophy Skin- no rash or lesion Psych- euthymic mood, full affect Neuro- strength and sensation are intact  EKG- Sinus brady at 58 bpm, PR int 168 ms, qrs int 82 ms, qtc 416 ms Dr. Silvestre Mesi notes reviewed    Assessment and Plan: 1. Intermittent  episodes of fast heart beat General education re afib Triggers discussed re afib Encouraged to limit alcohol to no more than  2 drinks a week The episodes sound suspicious for afib but are far between She went almost 6 weeks since the last episode I feel that an event monitor may not catch the episodes very quickly and if afib present, need to identify arrhythmia and start on appropriate anticoagulation to reduce stroke risk, chadsvasc score of 4 Discussed LInQ monitor with pt and she is wanting to pursue this Scheduled for tomorrow Am with Dr. Rayann Heman She will continue on metoprolol succinate 25 mg qd and increase by 12/ tablet for tachyarrhythmia's Echo   Will f/u in afib clinic if  LINQ events show afib   Neeraj Housand C. Keirstin Musil, [facility] [address] [place], [place] [address] [phone]

## 2017-08-10 NOTE — Progress Notes (Signed)
Primary Care Physician: Crist Infante, MD Referring Physician: Dr. Tonita Cong Marissa Powers is a 77 y.o. female with a h/o palpitations, htn, hypothyroidism.She saw Dr. Joylene Draft, 4/29, for her annual physical and mentioned rapid heart beat in the middle of the night, which sounded suspicious  for afib and referred here for further evaluation. The pt brings in recorded  V/S, where she had an episode for around 4 hours with HR's in the 100-120 bpm range in March. She went around 4-6  weeks before she had similar symptoms. She states that she may snore, but she is a widow so no one in the house to report apnea. She reports that she sleeps well and does not have daytime somnolence. She does drink alcohol several times a week, 1-2 drinks at a time. She has been on metoprolol for years for palpitations and will usually take an extra 1/2 tab of metoprolol when she has an episode. She takes a baby ASA a day. Chadsvasc score is at least 4. No tobacco use, minimal caffeine use. She is in SR today. Her son is a Secondary school teacher in [place].  Today, she denies symptoms of palpitations, chest pain, shortness of breath, orthopnea, PND, lower extremity edema, dizziness, presyncope, syncope, or neurologic sequela. The patient is tolerating medications without difficulties and is otherwise without complaint today.   Past Medical History:  Diagnosis Date  . Arthritis    osteoarthritis  . Dysplasia of cervix, low grade (CIN 1) 1990   HPV, Cryo, Laser  . 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 Fibroid 2002   1 cm, 2 cm  . GERD (gastroesophageal reflux disease)   . Hypercholesteremia   . Hypertension   . Hyperthyroidism    Past Surgical History:  Procedure Laterality Date  . BREAST BIOPSY Bilateral 1970's   x2 one a side  . CATARACT EXTRACTION     bilateral  . colon polyp removal    . RETINAL DETACHMENT SURGERY  2000  . THYROIDECTOMY    . TONSILLECTOMY    . TOTAL KNEE ARTHROPLASTY  04/25/2012   Procedure: TOTAL KNEE BILATERAL;  Surgeon: Gearlean Alf, MD;  Location: [facility];  Service: Orthopedics;  Laterality: Bilateral;    Current Outpatient Medications  Medication Sig Dispense Refill  . aspirin 81 MG tablet Take 81 mg by mouth at bedtime.     [name] CALCIUM PO Take 1 tablet by mouth daily.     . clobetasol ointment (TEMOVATE) 0.05 % Pea size amount as needed to affected area (Patient taking differently: Apply 1 application topically daily as needed (rash). Pea size amount as needed to affected area) 45 g 12  . ezetimibe (ZETIA) 10 MG tablet Take 10 mg by mouth daily.     [name] lisinopril (PRINIVIL,ZESTRIL) 20 MG tablet Take 20 mg by mouth every morning.    . metoprolol succinate (TOPROL-XL) 25 MG 24 hr tablet Take 25 mg by mouth every morning.     . zolpidem (AMBIEN) 5 MG tablet Take 5 mg by mouth at bedtime as needed for sleep.     [name] amLODipine (NORVASC) 5 MG tablet Take 5 mg by mouth at bedtime.    . Cholecalciferol (VITAMIN D PO) Take 1 tablet by mouth daily.    . Cyanocobalamin (B-12 PO) Take 1 tablet by mouth daily.    [name] esomeprazole (NEXIUM) 20 MG capsule Take 40 mg by mouth at bedtime.    [name] ibuprofen (ADVIL,MOTRIN) 200 MG  tablet Take 400 mg by mouth daily as needed for mild pain.    [name] levothyroxine (SYNTHROID, LEVOTHROID) 88 MCG tablet Take 88 mcg by mouth daily before breakfast.    . simvastatin (ZOCOR) 40 MG tablet Take 20 mg by mouth at bedtime.     No current facility-administered medications for this encounter.     Allergies  Allergen Reactions  . Cashew Nut Oil Other (See Comments)    Tingley, swelling    Social History   Socioeconomic History  . Marital status: Widowed    Spouse name: Not on file  . Number of children: Not on file  . Years of education: Not on file  . Highest education level: Not on file  Occupational History  . Not on file  Social Needs  . Financial resource strain: Not on file  . Food insecurity:    Worry: Not on file    Inability: Not  on file  . Transportation needs:    Medical: Not on file    Non-medical: Not on file  Tobacco Use  . Smoking status: Never Smoker  . Smokeless tobacco: Never Used  Substance and Sexual Activity  . Alcohol use: Yes    Alcohol/week: 2.4 - 3.6 oz    Types: 4 - 6 Standard drinks or equivalent per week  . Drug use: No  . Sexual activity: Never    Partners: Male  Lifestyle  . Physical activity:    Days per week: Not on file    Minutes per session: Not on file  . Stress: Not on file  Relationships  . Social connections:    Talks on phone: Not on file    Gets together: Not on file    Attends religious service: Not on file    Active member of club or organization: Not on file    Attends meetings of clubs or organizations: Not on file    Relationship status: Not on file  . Intimate partner violence:    Fear of current or ex partner: Not on file    Emotionally abused: Not on file    Physically abused: Not on file    Forced sexual activity: Not on file  Other Topics Concern  . Not on file  Social History Narrative  . Not on file    Family History  Problem Relation Age of Onset  . Breast cancer Maternal Grandmother   . Heart attack Maternal Grandmother   . Aortic aneurysm Mother   . Breast cancer Maternal Aunt 58  . Heart disease Brother        stints    ROS- All systems are reviewed and negative except as per the HPI above  Physical Exam: Vitals:   08/10/17 1404  BP: 134/72  Pulse: (!) 58  Weight: 189 lb (85.7 kg)  Height: 5' 5.25" (1.657 m)   Wt Readings from Last 3 Encounters:  08/10/17 189 lb (85.7 kg)  04/27/17 185 lb (83.9 kg)  05/27/16 177 lb (80.3 kg)    Labs: Lab Results  Component Value Date   NA 136 04/27/2012   K 3.8 04/27/2012   CL 104 04/27/2012   CO2 23 04/27/2012   GLUCOSE 137 (H) 04/27/2012   BUN 6 04/27/2012   CREATININE 0.56 04/27/2012   CALCIUM 8.8 04/27/2012   Lab Results  Component Value Date   INR 1.77 (H) 04/29/2012   No  results found for: CHOL, HDL, LDLCALC, TRIG   GEN- The patient is well appearing, alert  and oriented x 3 today.   Head- normocephalic, atraumatic Eyes-  Sclera clear, conjunctiva pink Ears- hearing intact Oropharynx- clear Neck- supple, no JVP Lymph- no cervical lymphadenopathy Lungs- Clear to ausculation bilaterally, normal work of breathing Heart- Regular rate and rhythm, no murmurs, rubs or gallops, PMI not laterally displaced GI- soft, NT, ND, + BS Extremities- no clubbing, cyanosis, or edema MS- no significant deformity or atrophy Skin- no rash or lesion Psych- euthymic mood, full affect Neuro- strength and sensation are intact  EKG- Sinus brady at 58 bpm, PR int 168 ms, qrs int 82 ms, qtc 416 ms Dr. Silvestre Mesi notes reviewed    Assessment and Plan: 1. Intermittent  episodes of fast heart beat General education re afib Triggers discussed re afib Encouraged to limit alcohol to no more than  2 drinks a week The episodes sound suspicious for afib but are far between She went almost 6 weeks since the last episode I feel that an event monitor may not catch the episodes very quickly and if afib present, need to identify arrhythmia and start on appropriate anticoagulation to reduce stroke risk, chadsvasc score of 4 Discussed LInQ monitor with pt and she is wanting to pursue this Scheduled for tomorrow Am with Dr. Rayann Heman She will continue on metoprolol succinate 25 mg qd and increase by 12/ tablet for tachyarrhythmia's Echo   Will f/u in afib clinic if  LINQ events show afib   Alvina Strother C. Esaw Knippel, [facility] [address] [place], [place] [address] [phone]

## 2017-08-11 ENCOUNTER — Encounter (HOSPITAL_COMMUNITY)
Admission: RE | Disposition: A | Discharge status: Home / Self Care | Payer: Self-pay | Source: Ambulatory Visit | Attending: Internal Medicine

## 2017-08-11 ENCOUNTER — Encounter (HOSPITAL_COMMUNITY): Payer: Self-pay | Admitting: Internal Medicine

## 2017-08-11 ENCOUNTER — Ambulatory Visit (HOSPITAL_COMMUNITY)
Admission: RE | Admit: 2017-08-11 | Discharge: 2017-08-11 | Disposition: A | Discharge status: Home / Self Care | Payer: Medicare Other | Source: Ambulatory Visit | Attending: Internal Medicine | Admitting: Internal Medicine

## 2017-08-11 DIAGNOSIS — Z96653 Presence of artificial knee joint, bilateral: Secondary | ICD-10-CM | POA: Insufficient documentation

## 2017-08-11 DIAGNOSIS — Z9842 Cataract extraction status, left eye: Secondary | ICD-10-CM | POA: Insufficient documentation

## 2017-08-11 DIAGNOSIS — Z91010 Allergy to peanuts: Secondary | ICD-10-CM | POA: Diagnosis not present

## 2017-08-11 DIAGNOSIS — Z9889 Other specified postprocedural states: Secondary | ICD-10-CM | POA: Diagnosis not present

## 2017-08-11 DIAGNOSIS — Z86010 Personal history of colonic polyps: Secondary | ICD-10-CM | POA: Insufficient documentation

## 2017-08-11 DIAGNOSIS — Z7982 Long term (current) use of aspirin: Secondary | ICD-10-CM | POA: Diagnosis not present

## 2017-08-11 DIAGNOSIS — K219 Gastro-esophageal reflux disease without esophagitis: Secondary | ICD-10-CM | POA: Diagnosis not present

## 2017-08-11 DIAGNOSIS — Z8249 Family history of ischemic heart disease and other diseases of the circulatory system: Secondary | ICD-10-CM | POA: Diagnosis not present

## 2017-08-11 DIAGNOSIS — Z79890 Hormone replacement therapy (postmenopausal): Secondary | ICD-10-CM | POA: Insufficient documentation

## 2017-08-11 DIAGNOSIS — R002 Palpitations: Secondary | ICD-10-CM | POA: Diagnosis not present

## 2017-08-11 DIAGNOSIS — Z9841 Cataract extraction status, right eye: Secondary | ICD-10-CM | POA: Diagnosis not present

## 2017-08-11 DIAGNOSIS — E7800 Pure hypercholesterolemia, unspecified: Secondary | ICD-10-CM | POA: Insufficient documentation

## 2017-08-11 DIAGNOSIS — M1990 Unspecified osteoarthritis, unspecified site: Secondary | ICD-10-CM | POA: Diagnosis not present

## 2017-08-11 DIAGNOSIS — Z79899 Other long term (current) drug therapy: Secondary | ICD-10-CM | POA: Diagnosis not present

## 2017-08-11 DIAGNOSIS — I10 Essential (primary) hypertension: Secondary | ICD-10-CM | POA: Insufficient documentation

## 2017-08-11 DIAGNOSIS — E039 Hypothyroidism, unspecified: Secondary | ICD-10-CM | POA: Diagnosis not present

## 2017-08-11 DIAGNOSIS — I4891 Unspecified atrial fibrillation: Secondary | ICD-10-CM | POA: Diagnosis not present

## 2017-08-11 HISTORY — PX: LOOP RECORDER INSERTION: EP1214

## 2017-08-11 SURGERY — LOOP RECORDER INSERTION

## 2017-08-11 MED ORDER — LIDOCAINE-EPINEPHRINE 1 %-1:100000 IJ SOLN
INTRAMUSCULAR | Status: AC
  Filled 2017-08-11: qty 1

## 2017-08-11 MED ORDER — LIDOCAINE-EPINEPHRINE 1 %-1:100000 IJ SOLN
INTRAMUSCULAR | Status: DC | PRN
  Administered 2017-08-11: 10 mL

## 2017-08-11 SURGICAL SUPPLY — 2 items
LOOP REVEAL LINQSYS (Prosthesis & Implant Heart) ×2 IMPLANT
PACK LOOP INSERTION (CUSTOM PROCEDURE TRAY) ×2 IMPLANT

## 2017-08-11 NOTE — Interval H&P Note (Signed)
History and Physical Interval Note:  08/11/2017 8:27 AM  Marissa Powers  has presented today for surgery, with the diagnosis of Tachycardia  The various methods of treatment have been discussed with the patient and family. After consideration of risks, benefits and other options for treatment, the patient has consented to  Procedure(s): LOOP RECORDER INSERTION (N/A) as a surgical intervention .  The patient's history has been reviewed, patient examined, no change in status, stable for surgery.  I have reviewed the patient's chart and labs.  Questions were answered to the patient's satisfaction.    I agree that further evaluation of palpitations is warranted.  Given infrequent nature of episodes, I also agree that 30 day event monitor has limited utility.  Long term monitoring with an implantable loop recorder is advised.   Risks and benefits of this procedure were discussed with the patient who wishes to proceed.  Thompson Grayer

## 2017-08-16 ENCOUNTER — Telehealth: Payer: Self-pay | Admitting: Cardiology

## 2017-08-16 NOTE — Telephone Encounter (Signed)
Patient called and stated that she experience shocking pain around the loop recorder site. Its worse at night when she is lying on her side. She wants to know if this is normal?

## 2017-08-16 NOTE — Telephone Encounter (Signed)
Spoke with patient who c/o sharp pain at ILR site occurring at night when she lay flat or turned on to her right side. She reported this pain began 3 days ago and has slightly increased in intensity. I recommended that she take tylenol to help alleviate the pain. We additionally walked through how to use a symptom activator and send a manual transmission.

## 2017-08-20 ENCOUNTER — Ambulatory Visit (HOSPITAL_COMMUNITY)
Admission: RE | Admit: 2017-08-20 | Discharge: 2017-08-20 | Disposition: A | Discharge status: Home / Self Care | Payer: Medicare Other | Source: Ambulatory Visit | Attending: Nurse Practitioner | Admitting: Nurse Practitioner

## 2017-08-20 DIAGNOSIS — I080 Rheumatic disorders of both mitral and aortic valves: Secondary | ICD-10-CM | POA: Insufficient documentation

## 2017-08-20 DIAGNOSIS — I479 Paroxysmal tachycardia, unspecified: Secondary | ICD-10-CM | POA: Insufficient documentation

## 2017-08-20 DIAGNOSIS — I4891 Unspecified atrial fibrillation: Secondary | ICD-10-CM | POA: Diagnosis present

## 2017-08-20 NOTE — Progress Notes (Signed)
  Echocardiogram 2D Echocardiogram has been performed.  Marissa Powers 08/20/2017, 8:51 AM

## 2017-08-24 ENCOUNTER — Ambulatory Visit: Payer: Self-pay

## 2017-08-24 ENCOUNTER — Telehealth: Payer: Self-pay

## 2017-08-24 NOTE — Telephone Encounter (Signed)
Pt returning call.  No need to reschedule wound check appt now, pt made aware.

## 2017-08-26 ENCOUNTER — Ambulatory Visit: Payer: Self-pay

## 2017-08-27 ENCOUNTER — Telehealth: Payer: Self-pay | Admitting: *Deleted

## 2017-08-27 DIAGNOSIS — I480 Paroxysmal atrial fibrillation: Secondary | ICD-10-CM | POA: Diagnosis not present

## 2017-08-27 DIAGNOSIS — I10 Essential (primary) hypertension: Secondary | ICD-10-CM | POA: Diagnosis not present

## 2017-08-27 DIAGNOSIS — Z6830 Body mass index (BMI) 30.0-30.9, adult: Secondary | ICD-10-CM | POA: Diagnosis not present

## 2017-08-27 NOTE — Telephone Encounter (Signed)
I spoke with NP [place] at [facility] this am.  She had already seen the patient and initiated eliquis and increased toprol to 50mg  daily. I had Rushie Goltz call the patient mid day to offer follow-up in AF clinic on Monday.  The patient had already converted to sinus rhythm and did not wish to have urgent follow-up.  She is aware to call us with any further concerns. I will see as scheduled.   We can have her seen sooner in AF clinic if needed.

## 2017-08-27 NOTE — Telephone Encounter (Signed)
Received alert for new AF episode, beginning at 2248 on 08/26/17.  Patient also marked 2 symptom episodes at 2249 and 2258 and sent a total of 3 manual transmissions.  Presenting rhythm as of most recent transmission at 0706 today shows AF.  Discussed with Dr. Rayann Heman, who recommended seeing how patient feels.  LMOM at home number.  Able to reach patient at cell number.  She states she is in Dr. Silvestre Mesi office waiting to be seen.  She denies ShOB, palpitations, chest discomfort, or dizziness.  BP 138/96, palpated HR 58bpm.  Reports her only symptom is feeling "internally unsettled."  Advised I will discuss with Dr. Rayann Heman and that he will likely discuss with Dr. Silvestre Mesi office directly.  Patient is agreeable and appreciative of call.   Received call from DJ, a nurse at [facility].  She reports that patient presented as a walk-in at their office this morning.  She reports that patient expressed being upset that she did not receive a call earlier this morning from our office with a plan.  Patient did not call our office per available notes in [place].  DJ reports that patient may be seen by Dr. Silvestre Mesi NP.  Advised that I will ask Dr. Rayann Heman to call DJ directly.  She verbalizes agreement with this plan.  Dr. Rayann Heman was able to discuss directly with staff at [facility].

## 2017-09-01 ENCOUNTER — Encounter (HOSPITAL_COMMUNITY): Payer: Self-pay | Admitting: *Deleted

## 2017-09-01 ENCOUNTER — Other Ambulatory Visit: Payer: Self-pay

## 2017-09-01 ENCOUNTER — Ambulatory Visit: Payer: Medicare Other | Admitting: *Deleted

## 2017-09-01 DIAGNOSIS — I4891 Unspecified atrial fibrillation: Secondary | ICD-10-CM

## 2017-09-01 LAB — CUP PACEART INCLINIC DEVICE CHECK
Date Time Interrogation Session: 20190522145307
Implantable Pulse Generator Implant Date: 20190501

## 2017-09-01 MED ORDER — METOPROLOL SUCCINATE ER 50 MG PO TB24: 50.0000 mg | ORAL_TABLET | ORAL | 3 refills | Status: DC | PRN

## 2017-09-01 MED ORDER — APIXABAN 2.5 MG PO TABS: 5.0000 mg | ORAL_TABLET | Freq: Two times a day (BID) | ORAL | Status: DC

## 2017-09-01 NOTE — Progress Notes (Signed)
Wound check appointment. Steri-strips removed. Wound without redness or edema. Incision edges approximated, wound well healed. Loop check in clinic. Battery status: good. R-waves 0.59mV. 3 symptom episodes, 0 tachy episodes, 0 pause episodes, 0 brady episodes. 1 AF episodes (2.1% burden). Monthly summary reports and ROV with JA 7/1

## 2017-09-02 ENCOUNTER — Telehealth: Payer: Self-pay | Admitting: *Deleted

## 2017-09-02 MED ORDER — METOPROLOL SUCCINATE ER 50 MG PO TB24: 50.0000 mg | ORAL_TABLET | Freq: Every day | ORAL | 3 refills | Status: DC

## 2017-09-02 MED ORDER — METOPROLOL SUCCINATE ER 25 MG PO TB24: ORAL_TABLET | ORAL | 3 refills | Status: DC

## 2017-09-02 MED ORDER — APIXABAN 5 MG PO TABS: 5.0000 mg | ORAL_TABLET | Freq: Two times a day (BID) | ORAL | 11 refills | Status: DC

## 2017-09-02 NOTE — Telephone Encounter (Signed)
Ms. Metzner calling to clarity Toprol dosage and ask about Eliquis.  Ms. Ilg only has enough eliquis to last through today, she went to pick up eliquis rx and found out that her insurance did not cover it. She reports that her pharmacy will be contacting us about a substitute. She wants to make sure that it is taken care of because she doesn't have any eliquis for tomorrow.  Additionally she has a Rx for Toprol XL 25mg  daily and 50mg  as needed for AF/ palpitations. She understood that she should take an additional 25mg  tablet as needed. Dr. Rayann Heman has a note that says he clarified with [place], NP at [facility] that Ms. Panameno's toprol dose was increased to 50mg  daily. She needs clarification as to what to take daily and on an "as needed" basis. Please let her know if she should clarify with [facility] or if Dr. Rayann Heman should be managing due to AF and ILR.

## 2017-09-02 NOTE — Telephone Encounter (Signed)
CVM left for Pt.  Notified Pt samples would be left at front desk so Pt would not run out until office can determine what type of blood thinner her insurance would cover. Also clarified Toprol XL orders. Take 50 mg Toprol XL one tablet daily. Take an additional Toprol XL 25 mg if heart still racing.

## 2017-09-02 NOTE — Telephone Encounter (Signed)
Received written message from Pt.  Per Pt she is able to get Eliquis at her pharmacy after this nurse corrected. No further action at this time.

## 2017-09-02 NOTE — Telephone Encounter (Signed)
She will need an updated BMET drawn to determine her renal function and what dose of Xarelto would be appropriate. There is no BMET in our system in the past 5 years and nothing in [facility] or [place] that I can see.

## 2017-09-03 ENCOUNTER — Other Ambulatory Visit: Payer: Self-pay | Admitting: Internal Medicine

## 2017-09-13 ENCOUNTER — Ambulatory Visit (INDEPENDENT_AMBULATORY_CARE_PROVIDER_SITE_OTHER): Payer: Medicare Other | Admitting: *Deleted

## 2017-09-13 DIAGNOSIS — I4891 Unspecified atrial fibrillation: Secondary | ICD-10-CM

## 2017-09-14 NOTE — Progress Notes (Signed)
[facility] Summary Report / Loop Recorder 

## 2017-09-17 ENCOUNTER — Ambulatory Visit: Payer: Self-pay | Admitting: Interventional Cardiology

## 2017-10-03 NOTE — Progress Notes (Signed)
Cardiology Office Note    Date:  10/04/2017   ID:  Marissa, Powers 01-31-1941, MRN [id number]  PCP:  Crist Infante, MD  Cardiologist: Sinclair Grooms, MD   Chief Complaint  Patient presents with  . Shortness of Breath  . Chest Pain  . Atrial Fibrillation    History of Present Illness:  Marissa Powers is a 77 y.o. female with history of chest pain, prior evaluation of chest pain with negative work-up, and hypertension.  Positive family history of CAD.  Recently diagnosed with atrial fibrillation and seen in the A. fib clinic.  Loop recorder implanted 08/11/2017 and within several days identified atrial fibrillation with rapid ventricular response and a burden of 2.1%.  In history retrieval from [place], she has had a sensation of tightness in the chest.  This is not exertion related.  She has had discomfort previously.  Prior stress testing is not demonstrated evidence of ischemia.  There is a strong family history of coronary disease and she has concerns about the discomfort.  She also complains of dyspnea on exertion.  Palpitations occur with atrial fibrillation and are distinct from the complaints of dyspnea and chest tightness.  Past Medical History:  Diagnosis Date  . Arthritis    osteoarthritis  . Dysplasia of cervix, low grade (CIN 1) 1990   HPV, Cryo, Laser  . 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 Fibroid 2002   1 cm, 2 cm  . GERD (gastroesophageal reflux disease)   . Hypercholesteremia   . Hypertension   . Hyperthyroidism     Past Surgical History:  Procedure Laterality Date  . BREAST BIOPSY Bilateral 1970's   x2 one a side  . CATARACT EXTRACTION     bilateral  . colon polyp removal    . LOOP RECORDER INSERTION N/A 08/11/2017   Procedure: LOOP RECORDER INSERTION;  Surgeon: Thompson Grayer, MD;  Location: [place] [facility];  Service: Cardiovascular;  Laterality: N/A;  . RETINAL DETACHMENT SURGERY  2000  . THYROIDECTOMY    . TONSILLECTOMY      . TOTAL KNEE ARTHROPLASTY  04/25/2012   Procedure: TOTAL KNEE BILATERAL;  Surgeon: Gearlean Alf, MD;  Location: [facility];  Service: Orthopedics;  Laterality: Bilateral;    Current Medications: Outpatient Medications Prior to Visit  Medication Sig Dispense Refill  . amLODipine (NORVASC) 5 MG tablet Take 5 mg by mouth at bedtime.    [name] apixaban (ELIQUIS) 5 MG TABS tablet Take 1 tablet (5 mg total) by mouth 2 (two) times daily. 60 tablet 11  . CALCIUM PO Take 1 tablet by mouth daily.     . Cholecalciferol (VITAMIN D PO) Take 1 tablet by mouth daily.    . clobetasol ointment (TEMOVATE) 0.05 % Pea size amount as needed to affected area (Patient taking differently: Apply 1 application topically daily as needed (rash). Pea size amount as needed to affected area) 45 g 12  . Cyanocobalamin (B-12 PO) Take 1 tablet by mouth daily.    [name] esomeprazole (NEXIUM) 20 MG capsule Take 40 mg by mouth at bedtime.    [name] ezetimibe (ZETIA) 10 MG tablet Take 10 mg by mouth daily.     [name] ibuprofen (ADVIL,MOTRIN) 200 MG tablet Take 400 mg by mouth daily as needed for mild pain.    [name] levothyroxine (SYNTHROID, LEVOTHROID) 88 MCG tablet Take 88 mcg by mouth daily before breakfast.    . metoprolol succinate (TOPROL XL)  25 MG 24 hr tablet Take one tablet daily as needed for breakthrough heart racing 30 tablet 3  . metoprolol succinate (TOPROL-XL) 50 MG 24 hr tablet Take 1 tablet (50 mg total) by mouth daily. Take with or immediately following a meal. 90 tablet 3  . simvastatin (ZOCOR) 40 MG tablet Take 20 mg by mouth at bedtime.    [name] zolpidem (AMBIEN) 5 MG tablet Take 5 mg by mouth at bedtime as needed for sleep.     [name] lisinopril (PRINIVIL,ZESTRIL) 20 MG tablet Take 20 mg by mouth every morning.     No facility-administered medications prior to visit.      Allergies:   Cashew nut oil   Social History   Socioeconomic History  . Marital status: Widowed    Spouse name: Not on file  . Number of children: Not on file  .  Years of education: Not on file  . Highest education level: Not on file  Occupational History  . Not on file  Social Needs  . Financial resource strain: Not on file  . Food insecurity:    Worry: Not on file    Inability: Not on file  . Transportation needs:    Medical: Not on file    Non-medical: Not on file  Tobacco Use  . Smoking status: Never Smoker  . Smokeless tobacco: Never Used  Substance and Sexual Activity  . Alcohol use: Yes    Alcohol/week: 2.4 - 3.6 oz    Types: 4 - 6 Standard drinks or equivalent per week  . Drug use: No  . Sexual activity: Never    Partners: Male  Lifestyle  . Physical activity:    Days per week: Not on file    Minutes per session: Not on file  . Stress: Not on file  Relationships  . Social connections:    Talks on phone: Not on file    Gets together: Not on file    Attends religious service: Not on file    Active member of club or organization: Not on file    Attends meetings of clubs or organizations: Not on file    Relationship status: Not on file  Other Topics Concern  . Not on file  Social History Narrative  . Not on file     Family History:  The patient's family history includes Aortic aneurysm in her mother; Breast cancer in her maternal grandmother; Breast cancer (age of onset: 8) in her maternal aunt; Clotting disorder in her mother; Healthy in her father; Heart attack in her maternal grandmother; Heart disease in her brother.   ROS:   Please see the history of present illness.    Occasional cough, muscle pain, irregular and skipped heartbeats, anxiety concerning health. All other systems reviewed and are negative.   PHYSICAL EXAM:   VS:  BP (!) 150/86   Pulse 63   Ht 5' 5.5" (1.664 m)   Wt 185 lb 9.6 oz (84.2 kg)   BMI 30.42 kg/m    GEN: Well nourished, well developed, in no acute distress  HEENT: normal  Neck: no JVD, carotid bruits, or masses Cardiac: RRR; no murmurs, rubs, or gallops,no edema  Respiratory:  clear  to auscultation bilaterally, normal work of breathing GI: soft, nontender, nondistended, + BS MS: no deformity or atrophy  Skin: warm and dry, no rash Neuro:  Alert and Oriented x 3, Strength and sensation are intact Psych: euthymic mood, full affect  Wt Readings from Last 3 Encounters:  10/04/17 185 lb 9.6 oz (84.2 kg)  08/11/17 188 lb (85.3 kg)  08/10/17 189 lb (85.7 kg)      Studies/Labs Reviewed:   EKG:  EKG sinus bradycardia with poor R wave progression.  Left axis deviation.  Recent Labs: No results found for requested labs within last 8760 hours.   Lipid Panel No results found for: CHOL, TRIG, HDL, CHOLHDL, VLDL, LDLCALC, LDLDIRECT  Additional studies/ records that were reviewed today include:  2D Doppler echocardiogram 08/20/2017: Study Conclusions   - Left ventricle: The cavity size was normal. Wall thickness was   normal. Systolic function was normal. The estimated ejection   fraction was in the range of 60% to 65%. Wall motion was normal;   there were no regional wall motion abnormalities. Features are   consistent with a pseudonormal left ventricular filling pattern,   with concomitant abnormal relaxation and increased filling   pressure (grade 2 diastolic dysfunction). - Aortic valve: Trileaflet; moderately calcified leaflets. There   was mild stenosis. There was trivial regurgitation. Mean gradient   (S): 18 mm Hg. Valve area (VTI): 1.69 cm^2. - Mitral valve: Mildly calcified annulus. There was trivial   regurgitation. - Left atrium: The atrium was mildly dilated. - Right ventricle: The cavity size was normal. Systolic function   was normal. - Pulmonary arteries: No complete TR doppler jet so unable to   estimate PA systolic pressure. - Inferior vena cava: The vessel was normal in size. The   respirophasic diameter changes were in the normal range (>= 50%),   consistent with normal central venous pressure.   Impressions:   - Normal LV size with EF  60-65%. Moderate diastolic dysfunction.   Normal RV size and systolic function. Mild aortic stenosis.      ASSESSMENT:    1. Paroxysmal atrial fibrillation (HCC)   2. Essential hypertension   3. Hypercholesteremia   4. Chest tightness   5. SOB (shortness of breath)   6. Nonrheumatic aortic valve stenosis      PLAN:  In order of problems listed above:  1. In sinus rhythm currently.  Tolerating beta-blocker therapy without significant issues.  Chads score is greater than 2.  Long-term anticoagulation therapy needs to be continued in the form of Eliquis 5 mg twice daily. 2. Elevated and needs better control.  Very lisinopril to lisinopril HCTZ 20/12.5 mg daily.  Basic metabolic panel in 1 week.  Target blood pressure 130/80 mmHg or less. 3. LDL target should be less than 100 and preferably 70 if possible. 4. Coronary CT angios with morphology and possible FFR if indicated.  This will help exclude significant coronary plaque burden and significant obstructive disease in light of complaints of dyspnea and chest tightness. 5. BNP will be done to exclude pulmonary congestion as a cause of dyspnea. 6. Mild aortic stenosis noted on most recent echo and will be followed clinically.  Will have a repeat echo in 1 year.  Further clinical instruction based upon finding of above study.  Blood pressure clinic in 3 weeks.  Laboratory data and 1 week.  Medication Adjustments/Labs and Tests Ordered: Current medicines are reviewed at length with the patient today.  Concerns regarding medicines are outlined above.  Medication changes, Labs and Tests ordered today are listed in the Patient Instructions below. Patient Instructions  Medication Instructions:  1) DISCONTINUE plain Lisinopril 2) START Lisinopril/HCTZ 20/12.5mg  once daily  Labwork: Your physician recommends that you return for lab work in: 7-10 days (BMET and Pro BNP)  Testing/Procedures: Your physician recommends that you have a  Coronary CT performed.  Your physician has requested that you have an echocardiogram just prior to seeing Dr. Tamala Julian back in one year.  Echocardiography is a painless test that uses sound waves to create images of your heart. It provides your doctor with information about the size and shape of your heart and how well your heart's chambers and valves are working. This procedure takes approximately one hour. There are no restrictions for this procedure.    Follow-Up: Your physician wants you to follow-up in: 1 year with Dr. Tamala Julian.  You will receive a reminder letter in the mail two months in advance. If you don't receive a letter, please call our office to schedule the follow-up appointment.   Any Other Special Instructions Will Be Listed Below (If Applicable).  Please arrive at the [facility] main entrance of [facility] at xx:xx AM (30-45 minutes prior to test start time)  [facility] [place], [place] [address] [phone]  Proceed to the [facility] Radiology Department (First Floor).  Please follow these instructions carefully (unless otherwise directed):  Hold all erectile dysfunction medications at least 48 hours prior to test.  On the Night Before the Test: . Drink plenty of water. . Do not consume any caffeinated/decaffeinated beverages or chocolate 12 hours prior to your test. . Do not take any antihistamines 12 hours prior to your test. . If you take Metformin do not take 24 hours prior to test.  On the Day of the Test: . Drink plenty of water. Do not drink any water within one hour of the test. . Do not eat any food 4 hours prior to the test. . You may take your regular medications prior to the test. . Make sure you take your Metoprolol the morning of your test. . HOLD Furosemide morning of the test.  After the Test: . Drink plenty of water. . After receiving IV contrast, you may experience a mild flushed feeling. This is  normal. . On occasion, you may experience a mild rash up to 24 hours after the test. This is not dangerous. If this occurs, you can take Benadryl 25 mg and increase your fluid intake. . If you experience trouble breathing, this can be serious. If it is severe call 911 IMMEDIATELY. If it is mild, please call our office. . If you take any of these medications: Glipizide/Metformin, Avandament, Glucavance, please do not take 48 hours after completing test.    If you need a refill on your cardiac medications before your next appointment, please call your pharmacy.      Signed, Sinclair Grooms, MD  10/04/2017 6:09 PM    [facility] [place], [place], [place]  [address] Phone: [phone]; Fax: [phone]

## 2017-10-04 ENCOUNTER — Encounter (INDEPENDENT_AMBULATORY_CARE_PROVIDER_SITE_OTHER): Payer: Self-pay

## 2017-10-04 ENCOUNTER — Encounter: Payer: Self-pay | Admitting: Interventional Cardiology

## 2017-10-04 ENCOUNTER — Ambulatory Visit (INDEPENDENT_AMBULATORY_CARE_PROVIDER_SITE_OTHER): Payer: Medicare Other | Admitting: Interventional Cardiology

## 2017-10-04 VITALS — BP 150/86 | HR 63 | Ht 65.5 in | Wt 185.6 lb

## 2017-10-04 DIAGNOSIS — I350 Nonrheumatic aortic (valve) stenosis: Secondary | ICD-10-CM

## 2017-10-04 DIAGNOSIS — I10 Essential (primary) hypertension: Secondary | ICD-10-CM | POA: Diagnosis not present

## 2017-10-04 DIAGNOSIS — I480 Paroxysmal atrial fibrillation: Secondary | ICD-10-CM | POA: Diagnosis not present

## 2017-10-04 DIAGNOSIS — E7800 Pure hypercholesterolemia, unspecified: Secondary | ICD-10-CM

## 2017-10-04 DIAGNOSIS — R0789 Other chest pain: Secondary | ICD-10-CM

## 2017-10-04 DIAGNOSIS — Z86010 Personal history of colonic polyps: Secondary | ICD-10-CM | POA: Diagnosis not present

## 2017-10-04 DIAGNOSIS — R05 Cough: Secondary | ICD-10-CM | POA: Diagnosis not present

## 2017-10-04 DIAGNOSIS — K219 Gastro-esophageal reflux disease without esophagitis: Secondary | ICD-10-CM | POA: Diagnosis not present

## 2017-10-04 DIAGNOSIS — R0602 Shortness of breath: Secondary | ICD-10-CM

## 2017-10-04 MED ORDER — LISINOPRIL-HYDROCHLOROTHIAZIDE 20-12.5 MG PO TABS: 1.0000 | ORAL_TABLET | Freq: Every day | ORAL | 3 refills | Status: DC

## 2017-10-04 NOTE — Patient Instructions (Addendum)
Medication Instructions:  1) DISCONTINUE plain Lisinopril 2) START Lisinopril/HCTZ 20/12.5mg  once daily  Labwork: Your physician recommends that you return for lab work in: 7-10 days (BMET and Pro BNP)   Testing/Procedures: Your physician recommends that you have a Coronary CT performed.  Your physician has requested that you have an echocardiogram just prior to seeing Dr. Tamala Julian back in one year.  Echocardiography is a painless test that uses sound waves to create images of your heart. It provides your doctor with information about the size and shape of your heart and how well your heart's chambers and valves are working. This procedure takes approximately one hour. There are no restrictions for this procedure.    Follow-Up: Your physician wants you to follow-up in: 1 year with Dr. Tamala Julian.  You will receive a reminder letter in the mail two months in advance. If you don't receive a letter, please call our office to schedule the follow-up appointment.   Any Other Special Instructions Will Be Listed Below (If Applicable).  Please arrive at the [facility] main entrance of [facility] at xx:xx AM (30-45 minutes prior to test start time)  [facility] [place], [place] [address] [phone]  Proceed to the [facility] Radiology Department (First Floor).  Please follow these instructions carefully (unless otherwise directed):  Hold all erectile dysfunction medications at least 48 hours prior to test.  On the Night Before the Test: . Drink plenty of water. . Do not consume any caffeinated/decaffeinated beverages or chocolate 12 hours prior to your test. . Do not take any antihistamines 12 hours prior to your test. . If you take Metformin do not take 24 hours prior to test.  On the Day of the Test: . Drink plenty of water. Do not drink any water within one hour of the test. . Do not eat any food 4 hours prior to the test. . You may take your  regular medications prior to the test. . Make sure you take your Metoprolol the morning of your test. . HOLD Furosemide morning of the test.  After the Test: . Drink plenty of water. . After receiving IV contrast, you may experience a mild flushed feeling. This is normal. . On occasion, you may experience a mild rash up to 24 hours after the test. This is not dangerous. If this occurs, you can take Benadryl 25 mg and increase your fluid intake. . If you experience trouble breathing, this can be serious. If it is severe call 911 IMMEDIATELY. If it is mild, please call our office. . If you take any of these medications: Glipizide/Metformin, Avandament, Glucavance, please do not take 48 hours after completing test.    If you need a refill on your cardiac medications before your next appointment, please call your pharmacy.

## 2017-10-11 ENCOUNTER — Other Ambulatory Visit: Payer: Medicare Other | Admitting: *Deleted

## 2017-10-11 ENCOUNTER — Ambulatory Visit (INDEPENDENT_AMBULATORY_CARE_PROVIDER_SITE_OTHER): Payer: Medicare Other | Admitting: Internal Medicine

## 2017-10-11 ENCOUNTER — Encounter: Payer: Self-pay | Admitting: Internal Medicine

## 2017-10-11 VITALS — BP 110/60 | HR 58 | Ht 65.5 in | Wt 182.0 lb

## 2017-10-11 DIAGNOSIS — R0789 Other chest pain: Secondary | ICD-10-CM | POA: Diagnosis not present

## 2017-10-11 DIAGNOSIS — I480 Paroxysmal atrial fibrillation: Secondary | ICD-10-CM

## 2017-10-11 DIAGNOSIS — I10 Essential (primary) hypertension: Secondary | ICD-10-CM

## 2017-10-11 DIAGNOSIS — R0602 Shortness of breath: Secondary | ICD-10-CM | POA: Diagnosis not present

## 2017-10-11 NOTE — Patient Instructions (Signed)
Medication Instructions:  Your physician recommends that you continue on your current medications as directed. Please refer to the Current Medication list given to you today.  Labwork: None ordered     *We will only notify you of abnormal results, otherwise continue current treatment plan.  Testing/Procedures: None ordered  Follow-Up: No follow up is needed at this time with Dr. Rayann Heman.  He will see you on an as needed basis.   * If you need a refill on your cardiac medications before your next appointment, please call your pharmacy.   *Please note that any paperwork needing to be filled out by the provider will need to be addressed at the front desk prior to seeing the provider. Please note that any FMLA, disability or other documents regarding health condition is subject to a $25.00 charge that must be received prior to completion of paperwork in the form of a money order or check.  Thank you for choosing [facility]!!

## 2017-10-11 NOTE — Progress Notes (Signed)
PCP: Crist Infante, MD Primary Cardiologist:  Dr Tamala Julian Primary EP: Dr Rayann Heman  [name] is a 77 y.o. female who presents today for routine electrophysiology followup. She has been diagnosed with afib by ILR.  afib burden is overall low.  She has been initiated on anticoagulation and is doing well.  Since last being seen in our clinic, the patient reports doing very well.  She has occasional SOB with exertion and also chest discomfort, which she attributes to dyspepsia.  She has been evaluated by Dr Tamala Julian and has a cardiac CT planned. Today, she denies symptoms of palpitations, chest pain, shortness of breath,  lower extremity edema, dizziness, presyncope, or syncope.  The patient is otherwise without complaint today.   Past Medical History:  Diagnosis Date  . Arthritis    osteoarthritis  . Dysplasia of cervix, low grade (CIN 1) 1990   HPV, Cryo, Laser  . 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 Fibroid 2002   1 cm, 2 cm  . GERD (gastroesophageal reflux disease)   . Hypercholesteremia   . Hypertension   . Hyperthyroidism    Past Surgical History:  Procedure Laterality Date  . BREAST BIOPSY Bilateral 1970's   x2 one a side  . CATARACT EXTRACTION     bilateral  . colon polyp removal    . LOOP RECORDER INSERTION N/A 08/11/2017   Procedure: LOOP RECORDER INSERTION;  Surgeon: Thompson Grayer, MD;  Location: [place] [facility];  Service: Cardiovascular;  Laterality: N/A;  . RETINAL DETACHMENT SURGERY  2000  . THYROIDECTOMY    . TONSILLECTOMY    . TOTAL KNEE ARTHROPLASTY  04/25/2012   Procedure: TOTAL KNEE BILATERAL;  Surgeon: Gearlean Alf, MD;  Location: [facility];  Service: Orthopedics;  Laterality: Bilateral;    ROS- all systems are reviewed and negatives except as per HPI above  Current Outpatient Medications  Medication Sig Dispense Refill  . amLODipine (NORVASC) 5 MG tablet Take 5 mg by mouth at bedtime.    [name] apixaban (ELIQUIS) 5 MG TABS tablet Take 1 tablet (5  mg total) by mouth 2 (two) times daily. 60 tablet 11  . CALCIUM PO Take 1 tablet by mouth daily.     . Cholecalciferol (VITAMIN D PO) Take 1 tablet by mouth daily.    . clobetasol ointment (TEMOVATE) 0.05 % Pea size amount as needed to affected area (Patient taking differently: Apply 1 application topically daily as needed (rash). Pea size amount as needed to affected area) 45 g 12  . Cyanocobalamin (B-12 PO) Take 1 tablet by mouth daily.    [name] esomeprazole (NEXIUM) 20 MG capsule Take 40 mg by mouth at bedtime.    [name] ezetimibe (ZETIA) 10 MG tablet Take 10 mg by mouth daily.     [name] ibuprofen (ADVIL,MOTRIN) 200 MG tablet Take 400 mg by mouth daily as needed for mild pain.    [name] levothyroxine (SYNTHROID, LEVOTHROID) 88 MCG tablet Take 88 mcg by mouth daily before breakfast.    . lisinopril-hydrochlorothiazide (PRINZIDE,ZESTORETIC) 20-12.5 MG tablet Take 1 tablet by mouth daily. 90 tablet 3  . metoprolol succinate (TOPROL XL) 25 MG 24 hr tablet Take one tablet daily as needed for breakthrough heart racing 30 tablet 3  . metoprolol succinate (TOPROL-XL) 50 MG 24 hr tablet Take 1 tablet (50 mg total) by mouth daily. Take with or immediately following a meal. 90 tablet 3  . simvastatin (ZOCOR) 40 MG tablet Take 20  mg by mouth at bedtime.    [name] zolpidem (AMBIEN) 5 MG tablet Take 5 mg by mouth at bedtime as needed for sleep.      No current facility-administered medications for this visit.     Physical Exam: Vitals:   10/11/17 1615  BP: 110/60  Pulse: (!) 58  Weight: 182 lb (82.6 kg)  Height: 5' 5.5" (1.664 m)    GEN- The patient is well appearing, alert and oriented x 3 today.   Head- normocephalic, atraumatic Eyes-  Sclera clear, conjunctiva pink Ears- hearing intact Oropharynx- clear Lungs- Clear to ausculation bilaterally, normal work of breathing Heart- Regular rate and rhythm, no murmurs, rubs or gallops, PMI not laterally displaced GI- soft, NT, ND, + BS Extremities- no clubbing,  cyanosis, or edema  Wt Readings from Last 3 Encounters:  10/11/17 182 lb (82.6 kg)  10/04/17 185 lb 9.6 oz (84.2 kg)  08/11/17 188 lb (85.3 kg)    EKG tracing ordered today is personally reviewed and shows sinus rhythm 58 bpm, PR 178 msec, QRS 78 msec, Qtc 422 msec, poor r wave transition  Assessment and Plan:  1. Paroxysmal atrial fibrillation Overall burden by ILR interrogation today is 0.2% She is appropriate anticoagulated with eliquis She takes additional metoprolol for afib  2. Chest pain Cardiac CT ordered by Dr Tamala Julian, remains pending Currently doing well  3. HTN Stable No change required today  4. GI symptoms Ok to proceed with GI workup if cardiac CT is unrevealing.  Would hold eliquis for 24 hours prior to the procedure.  [facility] Follow-up with Dr Tamala Julian as scheduled in a year (unless cardiac CT is abnormal at which time she will need to be seen sooner by his team). She wishes to follow-up with me and AF clinic as needed  Thompson Grayer MD, [facility] 10/11/2017 4:39 PM

## 2017-10-12 LAB — BASIC METABOLIC PANEL
BUN/Creatinine Ratio: 22 (ref 12–28)
BUN: 23 mg/dL (ref 8–27)
CO2: 21 mmol/L (ref 20–29)
Calcium: 9.7 mg/dL (ref 8.7–10.3)
Chloride: 104 mmol/L (ref 96–106)
Creatinine, Ser: 1.06 mg/dL — ABNORMAL HIGH (ref 0.57–1.00)
GFR calc Af Amer: 59 mL/min/{1.73_m2} — ABNORMAL LOW (ref >59)
GFR calc non Af Amer: 51 mL/min/{1.73_m2} — ABNORMAL LOW (ref >59)
Glucose: 91 mg/dL (ref 65–99)
Potassium: 4.4 mmol/L (ref 3.5–5.2)
Sodium: 142 mmol/L (ref 134–144)

## 2017-10-12 LAB — CUP PACEART INCLINIC DEVICE CHECK
Date Time Interrogation Session: 20190702162618
Implantable Pulse Generator Implant Date: 20190501

## 2017-10-12 LAB — PRO B NATRIURETIC PEPTIDE: NT-Pro BNP: 105 pg/mL (ref 0–738)

## 2017-10-18 ENCOUNTER — Encounter: Payer: Self-pay | Admitting: Interventional Cardiology

## 2017-10-19 ENCOUNTER — Ambulatory Visit (INDEPENDENT_AMBULATORY_CARE_PROVIDER_SITE_OTHER): Payer: Medicare Other | Admitting: *Deleted

## 2017-10-19 DIAGNOSIS — R002 Palpitations: Secondary | ICD-10-CM | POA: Diagnosis not present

## 2017-10-20 LAB — CUP PACEART REMOTE DEVICE CHECK
Date Time Interrogation Session: 20190603124033
Implantable Pulse Generator Implant Date: 20190501

## 2017-10-20 NOTE — Progress Notes (Signed)
[facility] Summary Report / Loop Recorder 

## 2017-10-22 ENCOUNTER — Encounter: Payer: Self-pay | Admitting: Interventional Cardiology

## 2017-10-22 ENCOUNTER — Other Ambulatory Visit: Payer: Self-pay | Admitting: *Deleted

## 2017-10-22 MED ORDER — LISINOPRIL 20 MG PO TABS: 20.0000 mg | ORAL_TABLET | Freq: Every day | ORAL | 3 refills | Status: DC

## 2017-11-18 ENCOUNTER — Ambulatory Visit (INDEPENDENT_AMBULATORY_CARE_PROVIDER_SITE_OTHER): Payer: Medicare Other | Admitting: *Deleted

## 2017-11-18 DIAGNOSIS — R002 Palpitations: Secondary | ICD-10-CM | POA: Diagnosis not present

## 2017-11-19 NOTE — Progress Notes (Signed)
[facility] Summary Report / Loop Recorder 

## 2017-11-25 LAB — CUP PACEART REMOTE DEVICE CHECK
Date Time Interrogation Session: 20190706133855
Implantable Pulse Generator Implant Date: 20190501

## 2017-11-30 ENCOUNTER — Ambulatory Visit (HOSPITAL_COMMUNITY)
Admission: RE | Admit: 2017-11-30 | Discharge: 2017-11-30 | Disposition: A | Discharge status: Home / Self Care | Payer: Medicare Other | Source: Ambulatory Visit | Attending: Interventional Cardiology | Admitting: Interventional Cardiology

## 2017-11-30 ENCOUNTER — Telehealth: Payer: Self-pay | Admitting: Cardiology

## 2017-11-30 DIAGNOSIS — I700 Atherosclerosis of aorta: Secondary | ICD-10-CM | POA: Insufficient documentation

## 2017-11-30 DIAGNOSIS — R0789 Other chest pain: Secondary | ICD-10-CM | POA: Diagnosis not present

## 2017-11-30 DIAGNOSIS — R0602 Shortness of breath: Secondary | ICD-10-CM

## 2017-11-30 DIAGNOSIS — I480 Paroxysmal atrial fibrillation: Secondary | ICD-10-CM | POA: Diagnosis not present

## 2017-11-30 DIAGNOSIS — K760 Fatty (change of) liver, not elsewhere classified: Secondary | ICD-10-CM | POA: Diagnosis not present

## 2017-11-30 DIAGNOSIS — I2510 Atherosclerotic heart disease of native coronary artery without angina pectoris: Secondary | ICD-10-CM | POA: Diagnosis not present

## 2017-11-30 LAB — POCT I-STAT CREATININE: Creatinine, Ser: 0.7 mg/dL (ref 0.44–1.00)

## 2017-11-30 MED ORDER — IOPAMIDOL (ISOVUE-370) INJECTION 76%
INTRAVENOUS | Status: AC
  Administered 2017-11-30: 80 mL
  Filled 2017-11-30: qty 100

## 2017-11-30 MED ORDER — NITROGLYCERIN 0.4 MG SL SUBL
SUBLINGUAL_TABLET | SUBLINGUAL | Status: AC
  Filled 2017-11-30: qty 2

## 2017-11-30 MED ORDER — NITROGLYCERIN 0.4 MG SL SUBL
0.8000 mg | SUBLINGUAL_TABLET | Freq: Once | SUBLINGUAL | Status: AC
  Administered 2017-11-30: 0.8 mg via SUBLINGUAL
  Filled 2017-11-30: qty 25

## 2017-11-30 NOTE — Progress Notes (Signed)
Patient ambulatory out of department with steady gait noted. Denies any complaints at d/c.

## 2017-11-30 NOTE — Telephone Encounter (Signed)
LMOVM requesting that pt send manual transmission b/c home monitor has not updated in at least 14 days.    

## 2017-11-30 NOTE — Progress Notes (Signed)
CT complete. Patient denies complaints. Patient given diet coke and crackers to drink.

## 2017-12-03 ENCOUNTER — Telehealth: Payer: Self-pay | Admitting: *Deleted

## 2017-12-03 DIAGNOSIS — E7800 Pure hypercholesterolemia, unspecified: Secondary | ICD-10-CM

## 2017-12-03 NOTE — Telephone Encounter (Signed)
-----   Message from Belva Crome, MD sent at 12/03/2017 10:34 AM EDT ----- Let the patient know there is mild plaque in one artery that does not impair blood flow. Therefore, need to work on preventing progression. Need to get LDL cholesterol < 70. Is there any recent blood work on lipid levels? If not, lets check liver and lipid. A copy will be sent to Crist Infante, MD

## 2017-12-03 NOTE — Telephone Encounter (Signed)
Spoke with pt and went over results and recommendations per Dr. Tamala Julian.  Pt has not had recent lipid panel.  Pt will come in Monday for labs.  Pt appreciative for call.

## 2017-12-06 ENCOUNTER — Other Ambulatory Visit: Payer: Medicare Other | Admitting: *Deleted

## 2017-12-06 DIAGNOSIS — E7800 Pure hypercholesterolemia, unspecified: Secondary | ICD-10-CM | POA: Diagnosis not present

## 2017-12-06 LAB — HEPATIC FUNCTION PANEL
ALT: 16 IU/L (ref 0–32)
AST: 20 IU/L (ref 0–40)
Albumin: 4.5 g/dL (ref 3.5–4.8)
Alkaline Phosphatase: 56 IU/L (ref 39–117)
Bilirubin Total: 0.6 mg/dL (ref 0.0–1.2)
Bilirubin, Direct: 0.15 mg/dL (ref 0.00–0.40)
Total Protein: 7.3 g/dL (ref 6.0–8.5)

## 2017-12-06 LAB — LIPID PANEL
Chol/HDL Ratio: 2.8 ratio (ref 0.0–4.4)
Cholesterol, Total: 145 mg/dL (ref 100–199)
HDL: 52 mg/dL (ref >39)
LDL Calculated: 74 mg/dL (ref 0–99)
Triglycerides: 93 mg/dL (ref 0–149)
VLDL Cholesterol Cal: 19 mg/dL (ref 5–40)

## 2017-12-21 ENCOUNTER — Ambulatory Visit (INDEPENDENT_AMBULATORY_CARE_PROVIDER_SITE_OTHER): Payer: Medicare Other | Admitting: *Deleted

## 2017-12-21 DIAGNOSIS — R002 Palpitations: Secondary | ICD-10-CM

## 2017-12-22 NOTE — Progress Notes (Signed)
[facility] Summary Report / Loop Recorder 

## 2017-12-28 LAB — CUP PACEART REMOTE DEVICE CHECK
Date Time Interrogation Session: 20190808133556
Implantable Pulse Generator Implant Date: 20190501

## 2018-01-07 LAB — CUP PACEART REMOTE DEVICE CHECK
Date Time Interrogation Session: 20190910140938
Implantable Pulse Generator Implant Date: 20190501

## 2018-01-19 DIAGNOSIS — L570 Actinic keratosis: Secondary | ICD-10-CM | POA: Diagnosis not present

## 2018-01-19 DIAGNOSIS — L821 Other seborrheic keratosis: Secondary | ICD-10-CM | POA: Diagnosis not present

## 2018-01-19 DIAGNOSIS — D2272 Melanocytic nevi of left lower limb, including hip: Secondary | ICD-10-CM | POA: Diagnosis not present

## 2018-01-19 DIAGNOSIS — D225 Melanocytic nevi of trunk: Secondary | ICD-10-CM | POA: Diagnosis not present

## 2018-01-20 DIAGNOSIS — Z23 Encounter for immunization: Secondary | ICD-10-CM | POA: Diagnosis not present

## 2018-01-24 ENCOUNTER — Ambulatory Visit (INDEPENDENT_AMBULATORY_CARE_PROVIDER_SITE_OTHER): Payer: Medicare Other | Admitting: *Deleted

## 2018-01-24 DIAGNOSIS — R002 Palpitations: Secondary | ICD-10-CM | POA: Diagnosis not present

## 2018-01-25 NOTE — Progress Notes (Signed)
[facility] Summary Report / Loop Recorder 

## 2018-01-27 ENCOUNTER — Telehealth: Payer: Self-pay | Admitting: Cardiology

## 2018-01-27 NOTE — Telephone Encounter (Signed)
LMOVM requesting that pt send manual transmission b/c home monitor has not updated in at least 14 days.    

## 2018-02-07 LAB — CUP PACEART REMOTE DEVICE CHECK
Date Time Interrogation Session: 20191013144033
Implantable Pulse Generator Implant Date: 20190501

## 2018-02-23 ENCOUNTER — Telehealth: Payer: Self-pay | Admitting: Cardiology

## 2018-02-23 NOTE — Telephone Encounter (Signed)
LMOVM requesting that pt send manual transmission b/c home monitor has not updated in at least 14 days.    

## 2018-02-25 ENCOUNTER — Ambulatory Visit (INDEPENDENT_AMBULATORY_CARE_PROVIDER_SITE_OTHER): Payer: Medicare Other | Admitting: *Deleted

## 2018-02-25 DIAGNOSIS — R002 Palpitations: Secondary | ICD-10-CM | POA: Diagnosis not present

## 2018-02-25 NOTE — Progress Notes (Signed)
[facility] Summary Report / Loop Recorder 

## 2018-03-30 ENCOUNTER — Ambulatory Visit (INDEPENDENT_AMBULATORY_CARE_PROVIDER_SITE_OTHER): Payer: Medicare Other

## 2018-03-30 DIAGNOSIS — R002 Palpitations: Secondary | ICD-10-CM

## 2018-03-30 LAB — CUP PACEART REMOTE DEVICE CHECK
Date Time Interrogation Session: 20191218150842
Implantable Pulse Generator Implant Date: 20190501

## 2018-03-31 NOTE — Progress Notes (Signed)
[facility] Summary Report / Loop Recorder 

## 2018-04-17 LAB — CUP PACEART REMOTE DEVICE CHECK
Date Time Interrogation Session: 20191115144006
Implantable Pulse Generator Implant Date: 20190501

## 2018-04-22 DIAGNOSIS — E538 Deficiency of other specified B group vitamins: Secondary | ICD-10-CM | POA: Diagnosis not present

## 2018-04-22 DIAGNOSIS — R82998 Other abnormal findings in urine: Secondary | ICD-10-CM | POA: Diagnosis not present

## 2018-04-29 DIAGNOSIS — E038 Other specified hypothyroidism: Secondary | ICD-10-CM | POA: Diagnosis not present

## 2018-04-29 DIAGNOSIS — M859 Disorder of bone density and structure, unspecified: Secondary | ICD-10-CM | POA: Diagnosis not present

## 2018-04-29 DIAGNOSIS — E538 Deficiency of other specified B group vitamins: Secondary | ICD-10-CM | POA: Diagnosis not present

## 2018-04-29 DIAGNOSIS — E7849 Other hyperlipidemia: Secondary | ICD-10-CM | POA: Diagnosis not present

## 2018-04-29 DIAGNOSIS — R7301 Impaired fasting glucose: Secondary | ICD-10-CM | POA: Diagnosis not present

## 2018-04-29 DIAGNOSIS — D649 Anemia, unspecified: Secondary | ICD-10-CM | POA: Diagnosis not present

## 2018-04-29 DIAGNOSIS — I10 Essential (primary) hypertension: Secondary | ICD-10-CM | POA: Diagnosis not present

## 2018-05-02 ENCOUNTER — Ambulatory Visit (INDEPENDENT_AMBULATORY_CARE_PROVIDER_SITE_OTHER): Payer: Medicare Other

## 2018-05-02 DIAGNOSIS — R002 Palpitations: Secondary | ICD-10-CM | POA: Diagnosis not present

## 2018-05-03 ENCOUNTER — Ambulatory Visit: Payer: Medicare Other | Admitting: Certified Nurse Midwife

## 2018-05-03 LAB — CUP PACEART REMOTE DEVICE CHECK
Date Time Interrogation Session: 20200120154040
Implantable Pulse Generator Implant Date: 20190501

## 2018-05-03 NOTE — Progress Notes (Signed)
[facility] Summary Report / Loop Recorder 

## 2018-05-04 ENCOUNTER — Other Ambulatory Visit (HOSPITAL_COMMUNITY)
Admission: RE | Admit: 2018-05-04 | Discharge: 2018-05-04 | Disposition: A | Discharge status: Home / Self Care | Payer: Medicare Other | Source: Ambulatory Visit | Attending: Certified Nurse Midwife | Admitting: Certified Nurse Midwife

## 2018-05-04 ENCOUNTER — Other Ambulatory Visit: Payer: Self-pay

## 2018-05-04 ENCOUNTER — Encounter: Payer: Self-pay | Admitting: Certified Nurse Midwife

## 2018-05-04 ENCOUNTER — Ambulatory Visit (INDEPENDENT_AMBULATORY_CARE_PROVIDER_SITE_OTHER): Payer: Medicare Other | Admitting: Certified Nurse Midwife

## 2018-05-04 VITALS — BP 112/80 | HR 68 | Resp 16 | Ht 65.25 in | Wt 182.0 lb

## 2018-05-04 DIAGNOSIS — Z01419 Encounter for gynecological examination (general) (routine) without abnormal findings: Secondary | ICD-10-CM | POA: Diagnosis not present

## 2018-05-04 DIAGNOSIS — Z124 Encounter for screening for malignant neoplasm of cervix: Secondary | ICD-10-CM

## 2018-05-04 DIAGNOSIS — L900 Lichen sclerosus et atrophicus: Secondary | ICD-10-CM

## 2018-05-04 NOTE — Patient Instructions (Signed)

## 2018-05-04 NOTE — Progress Notes (Signed)
78 y.o. [id number] Widowed  Caucasian Fe here for annual exam. Post menopausal, denies vaginal bleeding and occasional dryness, . Sees Dr Abner Greenspan this week also for aex and labs. Has had ? GI issues with constipation and reflux and changes in stool pattern will assess this week. She has noted Trying to eat good selections. Still driving and caring for home and self. Family very supportive. Uses clobetasol ointment only as needed for itching. No other health issues today.  No LMP recorded. Patient is postmenopausal.          Sexually active: No.  The current method of family planning is post menopausal status.    Exercising: Yes.    personal trainer & walking Smoker:  no  Review of Systems  Constitutional: Negative.   HENT: Negative.   Eyes: Negative.   Respiratory: Negative.   Cardiovascular: Negative.   Gastrointestinal:       Gas  Genitourinary: Negative.   Musculoskeletal: Negative.   Skin: Negative.   Neurological: Negative.   Endo/Heme/Allergies: Negative.   Psychiatric/Behavioral: Negative.     Health Maintenance: Pap:  04-02-16 neg History of Abnormal Pap: yes MMG:  2019 waiting for fax Self Breast exams: no Colonoscopy:  2014 neg per pt f/u 39yrs BMD:   2018 TDaP:  2007, to check with pcp Shingles: 2019 Pneumonia: had done Hep C and HIV: unsure Labs: with PCP   reports that she has never smoked. She has never used smokeless tobacco. She reports current alcohol use of about 4.0 - 6.0 standard drinks of alcohol per week. She reports that she does not use drugs.  Past Medical History:  Diagnosis Date  . Arthritis    osteoarthritis  . Dysplasia of cervix, low grade (CIN 1) 1990   HPV, Cryo, Laser  . 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 Fibroid 2002   1 cm, 2 cm  . GERD (gastroesophageal reflux disease)   . Hypercholesteremia   . Hypertension   . Hyperthyroidism     Past Surgical History:  Procedure Laterality Date  . BREAST BIOPSY Bilateral  1970's   x2 one a side  . CATARACT EXTRACTION     bilateral  . colon polyp removal    . LOOP RECORDER INSERTION N/A 08/11/2017   Procedure: LOOP RECORDER INSERTION;  Surgeon: Thompson Grayer, MD;  Location: [facility];  Service: Cardiovascular;  Laterality: N/A;  . RETINAL DETACHMENT SURGERY  2000  . THYROIDECTOMY    . TONSILLECTOMY    . TOTAL KNEE ARTHROPLASTY  04/25/2012   Procedure: TOTAL KNEE BILATERAL;  Surgeon: Gearlean Alf, MD;  Location: [facility];  Service: Orthopedics;  Laterality: Bilateral;    Current Outpatient Medications  Medication Sig Dispense Refill  . amLODipine (NORVASC) 5 MG tablet Take 5 mg by mouth at bedtime.    [name] apixaban (ELIQUIS) 5 MG TABS tablet Take 1 tablet (5 mg total) by mouth 2 (two) times daily. 60 tablet 11  . CALCIUM PO Take 1 tablet by mouth daily.     . Cholecalciferol (VITAMIN D PO) Take 1 tablet by mouth daily.    . clobetasol ointment (TEMOVATE) 0.05 % Pea size amount as needed to affected area (Patient not taking: Reported on 11/30/2017) 45 g 12  . Cyanocobalamin (B-12 PO) Take 1 tablet by mouth daily.    [name] esomeprazole (NEXIUM) 20 MG capsule Take 40 mg by mouth at bedtime.    [name] ezetimibe (ZETIA) 10 MG tablet Take  10 mg by mouth daily.     [name] ibuprofen (ADVIL,MOTRIN) 200 MG tablet Take 400 mg by mouth daily as needed for mild pain.    [name] levothyroxine (SYNTHROID, LEVOTHROID) 88 MCG tablet Take 88 mcg by mouth daily before breakfast.    . lisinopril (PRINIVIL,ZESTRIL) 20 MG tablet Take 1 tablet (20 mg total) by mouth daily. 90 tablet 3  . metoprolol succinate (TOPROL XL) 25 MG 24 hr tablet Take one tablet daily as needed for breakthrough heart racing 30 tablet 3  . metoprolol succinate (TOPROL-XL) 50 MG 24 hr tablet Take 1 tablet (50 mg total) by mouth daily. Take with or immediately following a meal. 90 tablet 3  . simvastatin (ZOCOR) 40 MG tablet Take 20 mg by mouth at bedtime.    [name] zolpidem (AMBIEN) 5 MG tablet Take 5 mg by mouth at bedtime as  needed for sleep.      No current facility-administered medications for this visit.     Family History  Problem Relation Age of Onset  . Breast cancer Maternal Grandmother   . Heart attack Maternal Grandmother   . Aortic aneurysm Mother   . Clotting disorder Mother   . Breast cancer Maternal Aunt 53  . Heart disease Brother        stints  . Healthy Father     ROS:  Pertinent items are noted in HPI.  Otherwise, a comprehensive ROS was negative.  Exam:   There were no vitals taken for this visit.   Ht Readings from Last 3 Encounters:  10/11/17 5' 5.5" (1.664 m)  10/04/17 5' 5.5" (1.664 m)  08/11/17 5\' 5"  (1.651 m)    General appearance: alert, cooperative and appears stated age Head: Normocephalic, without obvious abnormality, atraumatic Neck: no adenopathy, supple, symmetrical, trachea midline and thyroid normal to inspection and palpation Lungs: clear to auscultation bilaterally Breasts: normal appearance, no masses or tenderness, No nipple retraction or dimpling, No nipple discharge or bleeding, No axillary or supraclavicular adenopathy Heart: regular rate and rhythm Abdomen: soft, non-tender; no masses,  no organomegaly Extremities: extremities normal, atraumatic, no cyanosis or edema Skin: Skin color, texture, turgor normal. No rashes or lesions Lymph nodes: Cervical, supraclavicular, and axillary nodes normal. No abnormal inguinal nodes palpated Neurologic: Grossly normal   Pelvic: External genitalia:  no lesions, loss of pigmentation noted no change from Lichen occurrence. No active areas noted              Urethra:  normal appearing urethra with no masses, tenderness or lesions              Bartholin's and Skene's: normal                 Vagina: normal appearing vagina with normal color and discharge, no lesions              Cervix: multiparous appearance, no cervical motion tenderness and no lesions              Pap taken: Yes.   Bimanual Exam:  Uterus:  normal  size, contour, position, consistency, mobility, non-tender and anteverted              Adnexa: normal adnexa and no mass, fullness, tenderness               Rectovaginal: Confirms               Anus:  normal sphincter tone, no lesions  Chaperone present: yes  A:  Well  Woman with normal exam  Post menopausal with vaginal dryness, uses coconut oil  History of Lichen Sclerosis, no recent flares, will update Rx if needed  Hypothyroid, anxiety,Cholesterol management with PCP    P:   Reviewed health and wellness pertinent to exam  Discussed if dryness an issue needs to advise.  Stressed not to overuse Clobetasol, only as needed.  Continue follow up with PCP as indicated.  Pap smear: yes   counseled on breast self exam, mammography screening, feminine hygiene, osteoporosis, adequate intake of calcium and vitamin D, diet and exercise, Kegel's exercises return annually or prn  An After Visit Summary was printed and given to the patient.

## 2018-05-06 DIAGNOSIS — I2510 Atherosclerotic heart disease of native coronary artery without angina pectoris: Secondary | ICD-10-CM | POA: Diagnosis not present

## 2018-05-06 DIAGNOSIS — M859 Disorder of bone density and structure, unspecified: Secondary | ICD-10-CM | POA: Diagnosis not present

## 2018-05-06 DIAGNOSIS — Z0000 Encounter for general adult medical examination without abnormal findings: Secondary | ICD-10-CM | POA: Diagnosis not present

## 2018-05-06 DIAGNOSIS — R82998 Other abnormal findings in urine: Secondary | ICD-10-CM | POA: Diagnosis not present

## 2018-05-06 DIAGNOSIS — Z1331 Encounter for screening for depression: Secondary | ICD-10-CM | POA: Diagnosis not present

## 2018-05-06 DIAGNOSIS — Z1339 Encounter for screening examination for other mental health and behavioral disorders: Secondary | ICD-10-CM | POA: Diagnosis not present

## 2018-05-06 DIAGNOSIS — R011 Cardiac murmur, unspecified: Secondary | ICD-10-CM | POA: Diagnosis not present

## 2018-05-06 DIAGNOSIS — R808 Other proteinuria: Secondary | ICD-10-CM | POA: Diagnosis not present

## 2018-05-06 DIAGNOSIS — I10 Essential (primary) hypertension: Secondary | ICD-10-CM | POA: Diagnosis not present

## 2018-05-06 DIAGNOSIS — R7301 Impaired fasting glucose: Secondary | ICD-10-CM | POA: Diagnosis not present

## 2018-05-06 DIAGNOSIS — Z6830 Body mass index (BMI) 30.0-30.9, adult: Secondary | ICD-10-CM | POA: Diagnosis not present

## 2018-05-06 DIAGNOSIS — I480 Paroxysmal atrial fibrillation: Secondary | ICD-10-CM | POA: Diagnosis not present

## 2018-05-06 DIAGNOSIS — Z1212 Encounter for screening for malignant neoplasm of rectum: Secondary | ICD-10-CM | POA: Diagnosis not present

## 2018-05-06 DIAGNOSIS — R002 Palpitations: Secondary | ICD-10-CM | POA: Diagnosis not present

## 2018-05-09 LAB — CYTOLOGY - PAP
Diagnosis: NEGATIVE
HPV: NOT DETECTED

## 2018-06-06 ENCOUNTER — Ambulatory Visit (INDEPENDENT_AMBULATORY_CARE_PROVIDER_SITE_OTHER): Payer: Medicare Other | Admitting: *Deleted

## 2018-06-06 DIAGNOSIS — R002 Palpitations: Secondary | ICD-10-CM | POA: Diagnosis not present

## 2018-06-07 LAB — CUP PACEART REMOTE DEVICE CHECK
Date Time Interrogation Session: 20200222163859
Implantable Pulse Generator Implant Date: 20190501

## 2018-06-14 NOTE — Progress Notes (Signed)
[facility] Summary Report / Loop Recorder 

## 2018-06-21 DIAGNOSIS — Z86010 Personal history of colonic polyps: Secondary | ICD-10-CM | POA: Diagnosis not present

## 2018-06-21 DIAGNOSIS — R143 Flatulence: Secondary | ICD-10-CM | POA: Diagnosis not present

## 2018-06-21 DIAGNOSIS — K219 Gastro-esophageal reflux disease without esophagitis: Secondary | ICD-10-CM | POA: Diagnosis not present

## 2018-06-22 ENCOUNTER — Telehealth: Payer: Self-pay | Admitting: *Deleted

## 2018-06-22 ENCOUNTER — Encounter: Payer: Self-pay | Admitting: Certified Nurse Midwife

## 2018-06-22 DIAGNOSIS — Z803 Family history of malignant neoplasm of breast: Secondary | ICD-10-CM | POA: Diagnosis not present

## 2018-06-22 DIAGNOSIS — Z1231 Encounter for screening mammogram for malignant neoplasm of breast: Secondary | ICD-10-CM | POA: Diagnosis not present

## 2018-06-22 NOTE — Telephone Encounter (Addendum)
Will send a fax clearance notes to requesting surgeon's office.

## 2018-06-22 NOTE — Telephone Encounter (Signed)
Spoke with pt to schedule an appt with Truitt Merle, NP on 07/04/18 at 2 pm. Pt also asked if I could schedule her for an appt with Dr. Tamala Julian for a 1 year f/u after her echo is done on 09/13/18 at 8:30 am. Appt with Dr. Tamala Julian is schedule for 09/22/18 at 11:40 am okay per Maude Leriche, LPN. Pt is agreeable to both appts and thanked me for the call.

## 2018-06-22 NOTE — Telephone Encounter (Signed)
Patient with diagnosis of afib on Eliquis for anticoagulation.    Procedure: COLONOSCOPY/ENDOSCOPY  Date of procedure: 07/18/2018  CHADS2-VASc score of  4 (CHF, HTN, AGE, DM2, stroke/tia x 2, CAD, AGE, female)  CrCl 38ml/min  Per office protocol, patient can hold Eliquis for 1 days prior to procedure.

## 2018-06-22 NOTE — Telephone Encounter (Signed)
   Primary Cardiologist:Henry Nicholes Stairs III, MD  Chart reviewed as part of pre-operative protocol coverage. Because of Etrulia Zarr Louth's past medical history and time since last visit, he/she will require a follow-up visit in order to better assess preoperative cardiovascular risk.  Soon procedure on 07/18/18  Pre-op covering staff: - Please schedule appointment and call patient to inform them. - Please contact requesting surgeon's office via preferred method (i.e, phone, fax) to inform them of need for appointment prior to surgery.  If applicable, this message will also be routed to pharmacy pool and/or primary cardiologist for input on holding anticoagulant/antiplatelet agent as requested below so that this information is available at time of patient's appointment.   Cecilie Kicks, NP  06/22/2018, 12:25 PM

## 2018-06-22 NOTE — Telephone Encounter (Signed)
   [facility] Pre-operative Risk Assessment    Request for surgical clearance:  1. What type of surgery is being performed? COLONOSCOPY/ENDOSCOPY   2. When is this surgery scheduled? 07/18/18   3. What type of clearance is required (medical clearance vs. Pharmacy clearance to hold med vs. Both)? BOTH  4. Are there any medications that need to be held prior to surgery and how long?HOLD ELIQUIS THE NIGHT BEFORE  AND MORNING OF   5. Practice name and name of physician performing surgery? [facility]; DR. Cristina Gong   6. What is your office phone number [phone]   7.   What is your office fax number [phone]  8.   Anesthesia type (None, local, MAC, general) ? PROPOFOL    Marissa Powers 06/22/2018, 11:18 AM  _________________________________________________________________   (provider comments below)

## 2018-06-30 ENCOUNTER — Telehealth: Payer: Self-pay | Admitting: *Deleted

## 2018-06-30 NOTE — Telephone Encounter (Signed)
Per Truitt Merle, NP, stated to call pt to see if pt is OK and push out appt for upcoming surgical clearance for Monday, March 23.  Pt has appt with Dr. Tamala Julian and a echo in June and if pt was ok Cecille Rubin stated pt can keep June appt for surgical clearance unless pt need a emergent colonoscopy. LVM for pt to call back office.

## 2018-06-30 NOTE — Telephone Encounter (Signed)
Follow up:    Patient calling about a appt  That can be cancel with the PA please call patient back.

## 2018-06-30 NOTE — Telephone Encounter (Signed)
Follow up   Patient would like a call back to discuss the previous message.

## 2018-06-30 NOTE — Telephone Encounter (Signed)
Spoke with pt and she says as of now her colonoscopy is still scheduled and the plan is to move forward so she would still need the appt.  Pt is going to reach out to Dr. Osborn Coho office and see if they still plan to do procedures at this time.  She will call back to let us know either way so that we can plan accordingly for our office.

## 2018-06-30 NOTE — Telephone Encounter (Signed)
Left message to call back  

## 2018-06-30 NOTE — Telephone Encounter (Signed)
Spoke with pt and she states Dr. Osborn Coho office said they were starting to cancel non-emergent procedures.  No new date yet.  Advised pt we will keep June appt with Dr. Tamala Julian and to call for sooner appt if colonoscopy is rescheduled before her appt date.  Pt verbalized understanding and was appreciative for call.

## 2018-07-04 ENCOUNTER — Ambulatory Visit: Payer: Medicare Other | Admitting: Nurse Practitioner

## 2018-07-07 ENCOUNTER — Ambulatory Visit (INDEPENDENT_AMBULATORY_CARE_PROVIDER_SITE_OTHER): Payer: Medicare Other | Admitting: *Deleted

## 2018-07-07 ENCOUNTER — Other Ambulatory Visit: Payer: Self-pay

## 2018-07-07 DIAGNOSIS — R002 Palpitations: Secondary | ICD-10-CM | POA: Diagnosis not present

## 2018-07-09 LAB — CUP PACEART REMOTE DEVICE CHECK
Date Time Interrogation Session: 20200326174029
Implantable Pulse Generator Implant Date: 20190501

## 2018-07-13 NOTE — Progress Notes (Signed)
[facility] Summary Report / Loop Recorder 

## 2018-08-09 ENCOUNTER — Other Ambulatory Visit: Payer: Self-pay

## 2018-08-09 ENCOUNTER — Ambulatory Visit (INDEPENDENT_AMBULATORY_CARE_PROVIDER_SITE_OTHER): Payer: Medicare Other | Admitting: *Deleted

## 2018-08-09 DIAGNOSIS — R002 Palpitations: Secondary | ICD-10-CM

## 2018-08-09 DIAGNOSIS — I480 Paroxysmal atrial fibrillation: Secondary | ICD-10-CM

## 2018-08-10 LAB — CUP PACEART REMOTE DEVICE CHECK
Date Time Interrogation Session: 20200428181222
Implantable Pulse Generator Implant Date: 20190501

## 2018-08-11 DIAGNOSIS — D649 Anemia, unspecified: Secondary | ICD-10-CM | POA: Diagnosis not present

## 2018-08-16 DIAGNOSIS — D6489 Other specified anemias: Secondary | ICD-10-CM | POA: Diagnosis not present

## 2018-08-18 NOTE — Progress Notes (Signed)
[facility] Summary Report / Loop Recorder 

## 2018-09-06 ENCOUNTER — Telehealth: Payer: Self-pay | Admitting: Interventional Cardiology

## 2018-09-06 NOTE — Telephone Encounter (Signed)
Attempted to contact pt in regards to appt with Dr. Tamala Julian on 6/11.  Left message to call back.  Pt needs to be changed to a virtual visit using Doximity if possible.  Was going to offer same date and time as current appt or move her up to 5/28. 6/1 or 6/4 if any spots still available.  Please send to me and April Garrison, CMA to schedule.

## 2018-09-07 NOTE — Telephone Encounter (Signed)
Spoke with pt and she was agreeable to a video visit with Dr. Tamala Julian on 6/4.  Advised CMA will call a day or two prior with details.  Pt verbalized understanding and was appreciative for call.

## 2018-09-12 ENCOUNTER — Ambulatory Visit (INDEPENDENT_AMBULATORY_CARE_PROVIDER_SITE_OTHER): Payer: Medicare Other | Admitting: *Deleted

## 2018-09-12 ENCOUNTER — Telehealth (HOSPITAL_COMMUNITY): Payer: Self-pay

## 2018-09-12 DIAGNOSIS — R002 Palpitations: Secondary | ICD-10-CM

## 2018-09-12 LAB — CUP PACEART REMOTE DEVICE CHECK
Date Time Interrogation Session: 20200531183940
Implantable Pulse Generator Implant Date: 20190501

## 2018-09-12 NOTE — Telephone Encounter (Signed)
LMTCB COVID prescreening for echo. Left message with echo appointment details.

## 2018-09-12 NOTE — Telephone Encounter (Signed)

## 2018-09-13 ENCOUNTER — Ambulatory Visit (HOSPITAL_COMMUNITY): Payer: Medicare Other | Attending: Cardiology

## 2018-09-13 ENCOUNTER — Other Ambulatory Visit: Payer: Self-pay

## 2018-09-13 DIAGNOSIS — I350 Nonrheumatic aortic (valve) stenosis: Secondary | ICD-10-CM | POA: Insufficient documentation

## 2018-09-14 NOTE — Progress Notes (Signed)
Virtual Visit via Video Note   This visit type was conducted due to national recommendations for restrictions regarding the COVID-19 Pandemic (e.g. social distancing) in an effort to limit this patient's exposure and mitigate transmission in our community.  Due to her co-morbid illnesses, this patient is at least at moderate risk for complications without adequate follow up.  This format is felt to be most appropriate for this patient at this time.  All issues noted in this document were discussed and addressed.  A limited physical exam was performed with this format.  Please refer to the patient's chart for her consent to telehealth for [facility].   Date:  09/15/2018   ID:  Marissa Powers, DOB 1940-11-12, MRN [id number]  Patient Location: Home Provider Location: Home  PCP:  Marissa Infante, MD  Cardiologist:  Marissa Grooms, MD  Electrophysiologist:  Marissa Grayer, MD   Evaluation Performed:  Follow-Up Visit  Chief Complaint:  AS/CAD  History of Present Illness:    Marissa Powers is a 78 y.o. female with hypertension, hyperlipidemia, atypical CP, syncope, PAF identified on Loop reorder, and moderated AS.  Has weakness frequently - weak, lightheaded, and dyspneic.  She feels tired in her chest.  Not precipitated by physical activity or during workouts for walking.  Episodes can last 2 hours. Started <1 year ago.  She still remains quite active and is not being physically limited.  We discussed findings of recent echocardiogram which reveals moderate aortic stenosis not significantly different than prior.  She denies orthopnea, PND, and syncope.  No peripheral edema.  The patient does not have symptoms concerning for COVID-19 infection (fever, chills, cough, or new shortness of breath).    Past Medical History:  Diagnosis Date  . Arthritis    osteoarthritis  . Atrial fibrillation ([place])   . Dysplasia of cervix, low grade (CIN 1) 1990   HPV, Cryo, Laser  .  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 Fibroid 2002   1 cm, 2 cm  . GERD (gastroesophageal reflux disease)   . Hypercholesteremia   . Hypertension   . Hyperthyroidism    Past Surgical History:  Procedure Laterality Date  . BREAST BIOPSY Bilateral 1970's   x2 one a side  . CATARACT EXTRACTION     bilateral  . colon polyp removal    . LOOP RECORDER INSERTION N/A 08/11/2017   Procedure: LOOP RECORDER INSERTION;  Surgeon: Marissa Grayer, MD;  Location: [facility];  Service: Cardiovascular;  Laterality: N/A;  . RETINAL DETACHMENT SURGERY  2000  . THYROIDECTOMY    . TONSILLECTOMY    . TOTAL KNEE ARTHROPLASTY  04/25/2012   Procedure: TOTAL KNEE BILATERAL;  Surgeon: Marissa Alf, MD;  Location: [facility];  Service: Orthopedics;  Laterality: Bilateral;     Current Meds  Medication Sig  . amLODipine (NORVASC) 5 MG tablet Take 5 mg by mouth at bedtime.  [name] apixaban (ELIQUIS) 5 MG TABS tablet Take 1 tablet (5 mg total) by mouth 2 (two) times daily.  [name] CALCIUM PO Take 1 tablet by mouth daily.   . Cholecalciferol (VITAMIN D PO) Take 1 tablet by mouth daily.  . clobetasol ointment (TEMOVATE) 0.05 % Pea size amount as needed to affected area  . Cyanocobalamin (B-12 PO) Take 1 tablet by mouth daily.  [name] esomeprazole (NEXIUM) 20 MG capsule Take 40 mg by mouth at bedtime.  [name] levothyroxine (SYNTHROID, LEVOTHROID) 88 MCG tablet Take  88 mcg by mouth daily before breakfast.  . lisinopril-hydrochlorothiazide (PRINZIDE,ZESTORETIC) 20-12.5 MG tablet   . metoprolol succinate (TOPROL-XL) 25 MG 24 hr tablet Take 25 mg by mouth daily.  . simvastatin (ZOCOR) 40 MG tablet Take 20 mg by mouth at bedtime.  [name] zolpidem (AMBIEN) 5 MG tablet Take 5 mg by mouth at bedtime as needed for sleep.      Allergies:   Cashew nut oil   Social History   Tobacco Use  . Smoking status: Never Smoker  . Smokeless tobacco: Never Used  Substance Use Topics  . Alcohol use: Yes    Alcohol/week: 4.0 - 6.0  standard drinks    Types: 4 - 6 Standard drinks or equivalent per week  . Drug use: No     Family Hx: The patient's family history includes Aortic aneurysm in her mother; Breast cancer in her maternal grandmother; Breast cancer (age of onset: 47) in her maternal aunt; Clotting disorder in her mother; Healthy in her father; Heart attack in her maternal grandmother; Heart disease in her brother.  ROS:   Please see the history of present illness.    She has not had syncope, peripheral edema, or other complaints.  Has had some anxiety concerning the COVID-19 pandemic and other social issues that are occurring. All other systems reviewed and are negative.   Prior CV studies:   The following studies were reviewed today:  ECHOCARDIOGRAM 2020: IMPRESSIONS    1. The left ventricle has normal systolic function with an ejection fraction of 60-65%. The cavity size was normal. Left ventricular diastolic Doppler parameters are consistent with pseudonormalization. Elevated left ventricular end-diastolic pressure.  2. The right ventricle has normal systolic function. The cavity was mildly enlarged. There is no increase in right ventricular wall thickness.  3. Left atrial size was mild-moderately dilated.  4. The aortic valve is tricuspid. Moderate thickening of the aortic valve. Moderate calcification of the aortic valve. Aortic valve regurgitation is trivial by color flow Doppler. Moderate stenosis of the aortic valve.  5. The average left ventricular global longitudinal strain is -21.1 %.  Coronary CT angiogram August 2019 IMPRESSION: 1. Coronary calcium score of 90. This was 61 percentile for age and sex matched control.  2. Normal coronary origin with right dominance.  3. Moderate stenosis in the proximal LAD. Additional analysis with CT FFR will be submitted.  ACCOMPANYING FFR August 2019:  1. Left Main:  No significant stenosis.  2. LAD: Proximal: 0.95, distal: 0.81. 3. LCX: No  significant stenosis. 4. RCA: No significant stenosis.  IMPRESSION: 1. CT FFR analysis showed borderline lesion in the proximal LAD, given that it is proximal LAD a cardiac catheterization should be considered.   Labs/Other Tests and Data Reviewed:    EKG:  No ECG reviewed.  Recent Labs: 10/11/2017: BUN 23; NT-Pro BNP 105; Potassium 4.4; Sodium 142 11/30/2017: Creatinine, Ser 0.70 12/06/2017: ALT 16   Recent Lipid Panel Lab Results  Component Value Date/Time   CHOL 145 12/06/2017 08:40 AM   TRIG 93 12/06/2017 08:40 AM   HDL 52 12/06/2017 08:40 AM   CHOLHDL 2.8 12/06/2017 08:40 AM   LDLCALC 74 12/06/2017 08:40 AM    Wt Readings from Last 3 Encounters:  09/15/18 182 lb (82.6 kg)  05/04/18 182 lb (82.6 kg)  10/11/17 182 lb (82.6 kg)     Objective:    Vital Signs:  BP 111/67   Pulse (!) 52   Ht 5' 2.5" (1.588 m)   Wt 182  lb (82.6 kg)   BMI 32.76 kg/m    VITAL SIGNS:  reviewed GEN:  no acute distress RESPIRATORY:  normal respiratory effort, symmetric expansion CARDIOVASCULAR:  no peripheral edema MUSCULOSKELETAL:  no obvious deformities. NEURO:  alert and oriented x 3, no obvious focal deficit  ASSESSMENT & PLAN:    1. Nonrheumatic aortic valve stenosis   2. Paroxysmal atrial fibrillation (HCC)   3. Hypercholesteremia   4. Essential hypertension   5. Educated About Covid-19 Virus Infection   6. Coronary artery disease of native artery of native heart with stable angina pectoris ([place])    PLAN:  1. Repeat echo in 1 year.  We will check an LP(a) as it can be a risk factor for rapid progression of aortic stenosis.  If present PCSK9 therapy can dramatically lower LP(a). 2. This will be monitored with the loop recorder.  Currently on anticoagulation therapy to prevent embolic complications. 3. LDL target should be less than 70. 4. Target 130/80 mmHg 5. Presumed asymptomatic coronary disease.  We may try sublingual nitroglycerin for the episodes of weakness if her  blood pressure is okay to see if there is any improvement.  The absence of symptoms with exertion suggest that her complaints are not ischemi.  Overall education and awareness concerning primary/secondary risk prevention was discussed in detail: LDL less than 70, hemoglobin A1c less than 7, blood pressure target less than 130/80 mmHg, >150 minutes of moderate aerobic activity per week, avoidance of smoking, weight control (via diet and exercise), and continued surveillance/management of/for obstructive sleep apnea.    COVID-19 Education: The signs and symptoms of COVID-19 were discussed with the patient and how to seek care for testing (follow up with PCP or arrange E-visit).  The importance of social distancing was discussed today.  Time:   Today, I have spent 18 minutes with the patient with telehealth technology discussing the above problems.     Medication Adjustments/Labs and Tests Ordered: Current medicines are reviewed at length with the patient today.  Concerns regarding medicines are outlined above.   Tests Ordered: Orders Placed This Encounter  Procedures  . Lipid panel  . Lipoprotein A (LPA)  . ECHOCARDIOGRAM COMPLETE    Medication Changes: No orders of the defined types were placed in this encounter.   Disposition:  Follow up in 1 year(s)  Signed, Marissa Grooms, MD  09/15/2018 11:31 AM    [facility]

## 2018-09-15 ENCOUNTER — Encounter: Payer: Self-pay | Admitting: Interventional Cardiology

## 2018-09-15 ENCOUNTER — Telehealth: Payer: Self-pay | Admitting: Interventional Cardiology

## 2018-09-15 ENCOUNTER — Other Ambulatory Visit: Payer: Self-pay

## 2018-09-15 ENCOUNTER — Telehealth (INDEPENDENT_AMBULATORY_CARE_PROVIDER_SITE_OTHER): Payer: Medicare Other | Admitting: Interventional Cardiology

## 2018-09-15 VITALS — BP 111/67 | HR 52 | Ht 62.5 in | Wt 182.0 lb

## 2018-09-15 DIAGNOSIS — I10 Essential (primary) hypertension: Secondary | ICD-10-CM

## 2018-09-15 DIAGNOSIS — Z7189 Other specified counseling: Secondary | ICD-10-CM

## 2018-09-15 DIAGNOSIS — I25118 Atherosclerotic heart disease of native coronary artery with other forms of angina pectoris: Secondary | ICD-10-CM | POA: Insufficient documentation

## 2018-09-15 DIAGNOSIS — I350 Nonrheumatic aortic (valve) stenosis: Secondary | ICD-10-CM

## 2018-09-15 DIAGNOSIS — I480 Paroxysmal atrial fibrillation: Secondary | ICD-10-CM

## 2018-09-15 DIAGNOSIS — E7800 Pure hypercholesterolemia, unspecified: Secondary | ICD-10-CM

## 2018-09-15 MED ORDER — NITROGLYCERIN 0.4 MG SL SUBL: 0.4000 mg | SUBLINGUAL_TABLET | SUBLINGUAL | 3 refills | Status: DC | PRN

## 2018-09-15 NOTE — Patient Instructions (Addendum)
Medication Instructions:  1) The proper use and anticipated side effects of nitroglycerine has been carefully explained.  If a single episode of chest pain is not relieved by one tablet, the patient will try another within 5 minutes; and if this doesn't relieve the pain, the patient is instructed to call 911 for transportation to an emergency department.  If you need a refill on your cardiac medications before your next appointment, please call your pharmacy.   Lab work: Your physician recommends that you return for lab work as soon as you can. You will need to be fasting for these labs (nothing to eat or drink after midnight except water and black coffee).  If you have labs (blood work) drawn today and your tests are completely normal, you will receive your results only by: [name] MyChart Message (if you have MyChart) OR . A paper copy in the mail If you have any lab test that is abnormal or we need to change your treatment, we will call you to review the results.  Testing/Procedures: Your physician has requested that you have an echocardiogram just prior to seeing Dr. Tamala Julian back in one year. Echocardiography is a painless test that uses sound waves to create images of your heart. It provides your doctor with information about the size and shape of your heart and how well your heart's chambers and valves are working. This procedure takes approximately one hour. There are no restrictions for this procedure.   Follow-Up: At [facility], you and your health needs are our priority.  As part of our continuing mission to provide you with exceptional heart care, we have created designated Provider Care Teams.  These Care Teams include your primary Cardiologist (physician) and Advanced Practice Providers (APPs -  Physician Assistants and Nurse Practitioners) who all work together to provide you with the care you need, when you need it. You will need a follow up appointment in 12 months.  Please call our  office 2 months in advance to schedule this appointment.  You may see Sinclair Grooms, MD or one of the following Advanced Practice Providers on your designated Care Team:   Truitt Merle, NP Cecilie Kicks, NP . Kathyrn Drown, NP  Any Other Special Instructions Will Be Listed Below (If Applicable).

## 2018-09-15 NOTE — Addendum Note (Signed)
Addended by: Loren Racer on: 09/15/2018 11:49 AM   Modules accepted: Orders

## 2018-09-15 NOTE — Telephone Encounter (Signed)
New message:   Patient calling concering her appt today at 11 am please call patient.

## 2018-09-15 NOTE — Telephone Encounter (Signed)
I had called pt to update her chart for her appt today. She didn't answer. I will call her back now.

## 2018-09-20 DIAGNOSIS — M25512 Pain in left shoulder: Secondary | ICD-10-CM | POA: Insufficient documentation

## 2018-09-20 NOTE — Progress Notes (Signed)
[facility] Summary Report / Loop Recorder 

## 2018-09-21 ENCOUNTER — Telehealth: Payer: Self-pay

## 2018-09-21 DIAGNOSIS — M25511 Pain in right shoulder: Secondary | ICD-10-CM | POA: Diagnosis not present

## 2018-09-21 DIAGNOSIS — Z471 Aftercare following joint replacement surgery: Secondary | ICD-10-CM | POA: Diagnosis not present

## 2018-09-21 DIAGNOSIS — Z96653 Presence of artificial knee joint, bilateral: Secondary | ICD-10-CM | POA: Diagnosis not present

## 2018-09-21 DIAGNOSIS — M545 Low back pain: Secondary | ICD-10-CM | POA: Diagnosis not present

## 2018-09-21 NOTE — Telephone Encounter (Signed)
    COVID-19 Pre-Screening Questions:  . In the past 7 to 10 days have you had a cough,  shortness of breath, headache, congestion, fever (100 or greater) body aches, chills, sore throat, or sudden loss of taste or sense of smell? . Have you been around anyone with known Covid 19. . Have you been around anyone who is awaiting Covid 19 test results in the past 7 to 10 days? . Have you been around anyone who has been exposed to Covid 19, or has mentioned symptoms of Covid 19 within the past 7 to 10 days?  If you have any concerns/questions about symptoms patients report during screening (either on the phone or at threshold). Contact the provider seeing the patient or DOD for further guidance.  If neither are available contact a member of the leadership team.          Pt answered NO to all pre-screening questions. klb [id number] 09/21/2018

## 2018-09-22 ENCOUNTER — Ambulatory Visit: Payer: Medicare Other | Admitting: Interventional Cardiology

## 2018-09-22 ENCOUNTER — Other Ambulatory Visit: Payer: Self-pay

## 2018-09-22 ENCOUNTER — Other Ambulatory Visit: Payer: Medicare Other | Admitting: *Deleted

## 2018-09-22 DIAGNOSIS — I10 Essential (primary) hypertension: Secondary | ICD-10-CM | POA: Diagnosis not present

## 2018-09-22 DIAGNOSIS — E7800 Pure hypercholesterolemia, unspecified: Secondary | ICD-10-CM | POA: Diagnosis not present

## 2018-09-23 ENCOUNTER — Other Ambulatory Visit: Payer: Self-pay | Admitting: Interventional Cardiology

## 2018-09-23 LAB — LIPID PANEL
Chol/HDL Ratio: 3 ratio (ref 0.0–4.4)
Cholesterol, Total: 183 mg/dL (ref 100–199)
HDL: 61 mg/dL (ref >39)
LDL Calculated: 108 mg/dL — ABNORMAL HIGH (ref 0–99)
Triglycerides: 71 mg/dL (ref 0–149)
VLDL Cholesterol Cal: 14 mg/dL (ref 5–40)

## 2018-09-23 LAB — LIPOPROTEIN A (LPA): Lipoprotein (a): 65.5 nmol/L (ref <75.0)

## 2018-10-13 ENCOUNTER — Telehealth: Payer: Self-pay | Admitting: Interventional Cardiology

## 2018-10-13 DIAGNOSIS — I480 Paroxysmal atrial fibrillation: Secondary | ICD-10-CM | POA: Diagnosis not present

## 2018-10-13 DIAGNOSIS — R531 Weakness: Secondary | ICD-10-CM | POA: Diagnosis not present

## 2018-10-13 DIAGNOSIS — I10 Essential (primary) hypertension: Secondary | ICD-10-CM | POA: Diagnosis not present

## 2018-10-13 DIAGNOSIS — R42 Dizziness and giddiness: Secondary | ICD-10-CM | POA: Diagnosis not present

## 2018-10-13 DIAGNOSIS — E7800 Pure hypercholesterolemia, unspecified: Secondary | ICD-10-CM

## 2018-10-13 MED ORDER — ROSUVASTATIN CALCIUM 40 MG PO TABS: 40.0000 mg | ORAL_TABLET | Freq: Every day | ORAL | 3 refills | Status: DC

## 2018-10-13 NOTE — Telephone Encounter (Signed)
Spoke with the patient, she expressed understanding about starting Crestor 40 mg, daily, she had it sent to [place] since she lives there during the summer. She will send a MyChart to let us know when she can come in for the fasting labs in 6-8 weeks.

## 2018-10-13 NOTE — Telephone Encounter (Signed)
Notes recorded by Belva Crome, MD on 09/23/2018 at 3:17 PM EDT  Let the patient know LDL is too high. I recommend switch to Crestor 40 mg daily or lipitaor 80 mg daily to get LDL < 70. Liver and lipid in 6-8 weeks.  A copy will be sent to Crist Infante, MD

## 2018-10-13 NOTE — Telephone Encounter (Signed)
Patient returned a call to our office from [facility]. Please call her on her cell phone

## 2018-10-13 NOTE — Telephone Encounter (Signed)
The patient also updated, that she had a low pressure event, 98/67. Dr. Joylene Draft had her stop her amlodipine for 1 week.

## 2018-10-17 ENCOUNTER — Ambulatory Visit (INDEPENDENT_AMBULATORY_CARE_PROVIDER_SITE_OTHER): Payer: Medicare Other | Admitting: *Deleted

## 2018-10-17 DIAGNOSIS — R002 Palpitations: Secondary | ICD-10-CM

## 2018-10-18 LAB — CUP PACEART REMOTE DEVICE CHECK
Date Time Interrogation Session: 20200703194001
Implantable Pulse Generator Implant Date: 20190501

## 2018-10-26 DIAGNOSIS — R7301 Impaired fasting glucose: Secondary | ICD-10-CM | POA: Diagnosis not present

## 2018-10-26 DIAGNOSIS — R011 Cardiac murmur, unspecified: Secondary | ICD-10-CM | POA: Diagnosis not present

## 2018-10-26 DIAGNOSIS — E785 Hyperlipidemia, unspecified: Secondary | ICD-10-CM | POA: Diagnosis not present

## 2018-10-26 DIAGNOSIS — E039 Hypothyroidism, unspecified: Secondary | ICD-10-CM | POA: Diagnosis not present

## 2018-10-26 DIAGNOSIS — K219 Gastro-esophageal reflux disease without esophagitis: Secondary | ICD-10-CM | POA: Diagnosis not present

## 2018-10-26 DIAGNOSIS — I480 Paroxysmal atrial fibrillation: Secondary | ICD-10-CM | POA: Diagnosis not present

## 2018-10-26 DIAGNOSIS — D649 Anemia, unspecified: Secondary | ICD-10-CM | POA: Diagnosis not present

## 2018-10-26 DIAGNOSIS — I2510 Atherosclerotic heart disease of native coronary artery without angina pectoris: Secondary | ICD-10-CM | POA: Diagnosis not present

## 2018-10-26 DIAGNOSIS — I10 Essential (primary) hypertension: Secondary | ICD-10-CM | POA: Diagnosis not present

## 2018-10-26 DIAGNOSIS — I6529 Occlusion and stenosis of unspecified carotid artery: Secondary | ICD-10-CM | POA: Diagnosis not present

## 2018-10-27 NOTE — Progress Notes (Signed)
[facility] Summary Report / Loop Recorder 

## 2018-10-28 ENCOUNTER — Other Ambulatory Visit: Payer: Medicare Other | Admitting: *Deleted

## 2018-10-28 ENCOUNTER — Telehealth: Payer: Self-pay | Admitting: *Deleted

## 2018-10-28 ENCOUNTER — Other Ambulatory Visit: Payer: Self-pay

## 2018-10-28 DIAGNOSIS — E7800 Pure hypercholesterolemia, unspecified: Secondary | ICD-10-CM

## 2018-10-28 LAB — SPECIMEN STATUS

## 2018-10-28 NOTE — Telephone Encounter (Signed)
   [facility] Pre-operative Risk Assessment    Request for surgical clearance:  1. What type of surgery is being performed? Colonoscopy/Endoscopy   2. When is this surgery scheduled? 01/04/2019  3. What type of clearance is required (medical clearance vs. Pharmacy clearance to hold med vs. Both)? Both  4. Are there any medications that need to be held prior to surgery and how long? Eliquis, night before and morning of procedure  5. Practice name and name of physician performing surgery? [facility], Dr. Cristina Gong  6. What is your office phone number [phone]   7.   What is your office fax number [phone]  8.   Anesthesia type (None, local, MAC, general) ? Pending Recommendations   Marlis Edelson 10/28/2018, 5:02 PM  _________________________________________________________________   (provider comments below)

## 2018-11-01 ENCOUNTER — Other Ambulatory Visit: Payer: Self-pay | Admitting: Cardiology

## 2018-11-01 MED ORDER — ROSUVASTATIN CALCIUM 40 MG PO TABS: 20.0000 mg | ORAL_TABLET | Freq: Every day | ORAL | 3 refills | Status: DC

## 2018-11-01 NOTE — Telephone Encounter (Signed)
   Primary Cardiologist: Marissa Grooms, MD  Chart reviewed as part of pre-operative protocol coverage. Patient was contacted 11/01/2018 in reference to pre-operative risk assessment for pending surgery as outlined below.  Marissa Powers was last seen on 09/16/2018 by Dr. Tamala Julian.  Since that day, Marissa Powers has done better from a cardiac perspective. She reports that she ws taken off amlodipine and Zestoretic with improvement in her symptoms. She is no longer lightheaded or SOB.   Patient is on Eliquis for atrial fibrillation.   Procedure: Colonoscopy Date: 01/04/2019  CHA2DS2VASc =5 (HTN, age, CAD, female)  CrCl 71ml/min  Per pharmacy and office protocol, patient can hold Eliquis 1 day prior to procedure   Therefore, based on ACC/AHA guidelines, the patient would be at acceptable risk for the planned procedure without further cardiovascular testing.   I will route this recommendation to the requesting party via Epic fax function and remove from pre-op pool.  Please call with questions.  Marissa Drown, NP 11/01/2018, 11:04 AM

## 2018-11-01 NOTE — Progress Notes (Signed)
    Called pt in reference to upcoming colonoscopy and pre-operative clearance. It was noted that she was having DOE and lightheadedness at last OV on 09/15/2018 with Dr. Tamala Julian. Pt reports that her symptoms have improved since she last saw him. She states that her PCP took her off amlodipine and Zestoretic due to low BP's and she feels much better. She was continued on Toprol XL.   She was also transitioned to Crestor 40mg  QD and reports that she is having daily headaches since starting this medication. She had great response in her LDL levels. She went from 108>45 in one month. Will try her on lower dose Crestor at 20mg  for some time and attempt to increase at a later date.    Kathyrn Drown NP-C [place] Pager: [phone]

## 2018-11-01 NOTE — Telephone Encounter (Signed)
Patient with diagnosis of afib on Eliquis for anticoagulation.    Procedure: Colonoscopy/Endoscopy  Date of procedure: 01/04/2019  CHADS2-VASc score of  5 (HTN, AGE,  CAD, AGE, female)  CrCl 11ml/min  Per office protocol, patient can hold Eliquis 1 day prior to procedure.

## 2018-11-01 NOTE — Telephone Encounter (Signed)
I left a message for the patient to return my call.

## 2018-11-16 ENCOUNTER — Ambulatory Visit (INDEPENDENT_AMBULATORY_CARE_PROVIDER_SITE_OTHER): Payer: Medicare Other | Admitting: *Deleted

## 2018-11-16 DIAGNOSIS — R002 Palpitations: Secondary | ICD-10-CM | POA: Diagnosis not present

## 2018-11-17 LAB — HEPATIC FUNCTION PANEL
ALT: 12 IU/L (ref 0–32)
AST: 14 IU/L (ref 0–40)
Albumin: 4.4 g/dL (ref 3.7–4.7)
Alkaline Phosphatase: 39 IU/L (ref 39–117)
Bilirubin Total: 0.6 mg/dL (ref 0.0–1.2)
Bilirubin, Direct: 0.15 mg/dL (ref 0.00–0.40)
Total Protein: 6.5 g/dL (ref 6.0–8.5)

## 2018-11-17 LAB — CUP PACEART REMOTE DEVICE CHECK
Date Time Interrogation Session: 20200805193751
Implantable Pulse Generator Implant Date: 20190501

## 2018-11-17 LAB — LIPID PANEL
Chol/HDL Ratio: 2.2 ratio (ref 0.0–4.4)
Cholesterol, Total: 122 mg/dL (ref 100–199)
HDL: 55 mg/dL (ref >39)
LDL Calculated: 45 mg/dL (ref 0–99)
Triglycerides: 108 mg/dL (ref 0–149)
VLDL Cholesterol Cal: 22 mg/dL (ref 5–40)

## 2018-11-22 NOTE — Progress Notes (Signed)
[facility] Summary Report / Loop Recorder 

## 2018-12-20 ENCOUNTER — Ambulatory Visit (INDEPENDENT_AMBULATORY_CARE_PROVIDER_SITE_OTHER): Payer: Medicare Other | Admitting: *Deleted

## 2018-12-20 DIAGNOSIS — R002 Palpitations: Secondary | ICD-10-CM

## 2018-12-20 LAB — CUP PACEART REMOTE DEVICE CHECK
Date Time Interrogation Session: 20200907224114
Implantable Pulse Generator Implant Date: 20190501

## 2019-01-04 NOTE — Progress Notes (Signed)
[facility] Summary Report / Loop Recorder 

## 2019-01-05 DIAGNOSIS — Z23 Encounter for immunization: Secondary | ICD-10-CM | POA: Diagnosis not present

## 2019-01-10 DIAGNOSIS — Z1159 Encounter for screening for other viral diseases: Secondary | ICD-10-CM | POA: Diagnosis not present

## 2019-01-13 DIAGNOSIS — Z86010 Personal history of colonic polyps: Secondary | ICD-10-CM | POA: Diagnosis not present

## 2019-01-13 DIAGNOSIS — D123 Benign neoplasm of transverse colon: Secondary | ICD-10-CM | POA: Diagnosis not present

## 2019-01-13 DIAGNOSIS — K317 Polyp of stomach and duodenum: Secondary | ICD-10-CM | POA: Diagnosis not present

## 2019-01-13 DIAGNOSIS — K219 Gastro-esophageal reflux disease without esophagitis: Secondary | ICD-10-CM | POA: Diagnosis not present

## 2019-01-13 DIAGNOSIS — D120 Benign neoplasm of cecum: Secondary | ICD-10-CM | POA: Diagnosis not present

## 2019-01-18 DIAGNOSIS — K317 Polyp of stomach and duodenum: Secondary | ICD-10-CM | POA: Diagnosis not present

## 2019-01-18 DIAGNOSIS — D123 Benign neoplasm of transverse colon: Secondary | ICD-10-CM | POA: Diagnosis not present

## 2019-01-18 DIAGNOSIS — K219 Gastro-esophageal reflux disease without esophagitis: Secondary | ICD-10-CM | POA: Diagnosis not present

## 2019-01-18 DIAGNOSIS — D120 Benign neoplasm of cecum: Secondary | ICD-10-CM | POA: Diagnosis not present

## 2019-01-23 ENCOUNTER — Ambulatory Visit (INDEPENDENT_AMBULATORY_CARE_PROVIDER_SITE_OTHER): Payer: Medicare Other | Admitting: *Deleted

## 2019-01-23 DIAGNOSIS — I480 Paroxysmal atrial fibrillation: Secondary | ICD-10-CM | POA: Diagnosis not present

## 2019-01-23 LAB — CUP PACEART REMOTE DEVICE CHECK
Date Time Interrogation Session: 20201012162731
Implantable Pulse Generator Implant Date: 20190501

## 2019-02-02 NOTE — Progress Notes (Signed)
[facility] Summary Report / Loop Recorder 

## 2019-02-25 LAB — CUP PACEART REMOTE DEVICE CHECK
Date Time Interrogation Session: 20201114150822
Implantable Pulse Generator Implant Date: 20190501

## 2019-02-27 ENCOUNTER — Ambulatory Visit (INDEPENDENT_AMBULATORY_CARE_PROVIDER_SITE_OTHER): Payer: Medicare Other | Admitting: *Deleted

## 2019-02-27 DIAGNOSIS — I480 Paroxysmal atrial fibrillation: Secondary | ICD-10-CM

## 2019-03-24 NOTE — Progress Notes (Signed)
[facility] Summary Report / Loop Recorder 

## 2019-03-30 ENCOUNTER — Ambulatory Visit (INDEPENDENT_AMBULATORY_CARE_PROVIDER_SITE_OTHER): Payer: Medicare Other | Admitting: *Deleted

## 2019-03-30 DIAGNOSIS — I480 Paroxysmal atrial fibrillation: Secondary | ICD-10-CM

## 2019-03-30 LAB — CUP PACEART REMOTE DEVICE CHECK
Date Time Interrogation Session: 20201217123536
Implantable Pulse Generator Implant Date: 20190501

## 2019-04-29 NOTE — Progress Notes (Signed)
ILR remote 

## 2019-05-01 DIAGNOSIS — C44319 Basal cell carcinoma of skin of other parts of face: Secondary | ICD-10-CM | POA: Diagnosis not present

## 2019-05-01 DIAGNOSIS — Z85828 Personal history of other malignant neoplasm of skin: Secondary | ICD-10-CM | POA: Diagnosis not present

## 2019-05-02 ENCOUNTER — Ambulatory Visit (INDEPENDENT_AMBULATORY_CARE_PROVIDER_SITE_OTHER): Payer: Medicare Other | Admitting: *Deleted

## 2019-05-02 ENCOUNTER — Ambulatory Visit: Payer: Medicare Other | Attending: Internal Medicine

## 2019-05-02 DIAGNOSIS — I480 Paroxysmal atrial fibrillation: Secondary | ICD-10-CM

## 2019-05-02 DIAGNOSIS — Z23 Encounter for immunization: Secondary | ICD-10-CM | POA: Insufficient documentation

## 2019-05-02 LAB — CUP PACEART REMOTE DEVICE CHECK
Date Time Interrogation Session: 20210119124608
Implantable Pulse Generator Implant Date: 20190501

## 2019-05-02 NOTE — Progress Notes (Signed)
   Covid-19 Vaccination Clinic  Name:  Marissa Powers    MRN: [id number] DOB: 1940-04-30  05/02/2019  [name] was observed post Covid-19 immunization for 15 minutes without incidence. She was provided with Vaccine Information Sheet and instruction to access the V-Safe system.   [name] was instructed to call 911 with any severe reactions post vaccine: [name] Difficulty breathing  . Swelling of your face and throat  . A fast heartbeat  . A bad rash all over your body  . Dizziness and weakness    Immunizations Administered    Name Date Dose VIS Date Route   Pfizer COVID-19 Vaccine 05/02/2019 11:32 AM 0.3 mL 03/24/2019 Intramuscular   Manufacturer: [place]   Lot: [id number]   [place]: [id number]

## 2019-05-08 ENCOUNTER — Other Ambulatory Visit: Payer: Self-pay

## 2019-05-09 ENCOUNTER — Ambulatory Visit (INDEPENDENT_AMBULATORY_CARE_PROVIDER_SITE_OTHER): Payer: Medicare Other | Admitting: Certified Nurse Midwife

## 2019-05-09 ENCOUNTER — Encounter: Payer: Self-pay | Admitting: Certified Nurse Midwife

## 2019-05-09 ENCOUNTER — Other Ambulatory Visit: Payer: Self-pay

## 2019-05-09 VITALS — BP 114/68 | HR 70 | Temp 97.0°F | Resp 16 | Ht 65.0 in | Wt 181.0 lb

## 2019-05-09 DIAGNOSIS — Z780 Asymptomatic menopausal state: Secondary | ICD-10-CM | POA: Diagnosis not present

## 2019-05-09 DIAGNOSIS — Z01419 Encounter for gynecological examination (general) (routine) without abnormal findings: Secondary | ICD-10-CM

## 2019-05-09 DIAGNOSIS — L900 Lichen sclerosus et atrophicus: Secondary | ICD-10-CM

## 2019-05-09 DIAGNOSIS — Z124 Encounter for screening for malignant neoplasm of cervix: Secondary | ICD-10-CM

## 2019-05-09 NOTE — Progress Notes (Signed)
79 y.o. [id number] Widowed  Caucasian Fe here for annual exam.Post menopause. Denies vaginal bleeding or vaginal dryness. Has appointment with PCP in one week, also does BMD and is due. Having some joint pain and plans to discuss with PCP. Hypertension, hypothyroid, cholesterol management with PCP, all stable per patient. Mammogram current. Patient is staying active and no concerns with fall risk. Family very supportive and available as needed. No other health issues today.   No LMP recorded. Patient is postmenopausal.          Sexually active: No.  The current method of family planning is post menopausal status.    Exercising: Yes.    walking 2-3 miles daily, stretching Smoker:  no  Review of Systems  Constitutional: Negative.   HENT: Negative.   Eyes: Negative.   Respiratory: Negative.   Cardiovascular: Negative.   Gastrointestinal: Negative.   Genitourinary: Negative.   Musculoskeletal: Negative.   Skin: Negative.   Neurological: Negative.   Endo/Heme/Allergies: Negative.   Psychiatric/Behavioral: Negative.     Health Maintenance: Pap:  04-02-16 neg, 05-04-2018 neg HPV HR neg History of Abnormal Pap: yes MMG:  06-22-2018 category b density birads 1:neg Self Breast exams: no Colonoscopy:  2020 per patient BMD:   2018  TDaP:  Pt to check with pcp Shingles: 2019 & 2020 Pneumonia: had done  Hep C and HIV: unsure Labs: with PCP   reports that she has never smoked. She has never used smokeless tobacco. She reports current alcohol use of about 4.0 - 6.0 standard drinks of alcohol per week. She reports that she does not use drugs.  Past Medical History:  Diagnosis Date  . Arthritis    osteoarthritis  . Atrial fibrillation ([place])   . Dysplasia of cervix, low grade (CIN 1) 1990   HPV, Cryo, Laser  . 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 Fibroid 2002   1 cm, 2 cm  . GERD (gastroesophageal reflux disease)   . Hypercholesteremia   . Hypertension   . Hyperthyroidism      Past Surgical History:  Procedure Laterality Date  . BREAST BIOPSY Bilateral 1970's   x2 one a side  . CATARACT EXTRACTION     bilateral  . colon polyp removal    . LOOP RECORDER INSERTION N/A 08/11/2017   Procedure: LOOP RECORDER INSERTION;  Surgeon: Thompson Grayer, MD;  Location: [facility];  Service: Cardiovascular;  Laterality: N/A;  . RETINAL DETACHMENT SURGERY  2000  . THYROIDECTOMY    . TONSILLECTOMY    . TOTAL KNEE ARTHROPLASTY  04/25/2012   Procedure: TOTAL KNEE BILATERAL;  Surgeon: Gearlean Alf, MD;  Location: [facility];  Service: Orthopedics;  Laterality: Bilateral;    Current Outpatient Medications  Medication Sig Dispense Refill  . apixaban (ELIQUIS) 5 MG TABS tablet Take 1 tablet (5 mg total) by mouth 2 (two) times daily. 60 tablet 11  . CALCIUM PO Take 1 tablet by mouth daily.     . Cholecalciferol (VITAMIN D PO) Take 1 tablet by mouth daily.    . clobetasol ointment (TEMOVATE) 0.05 % Pea size amount as needed to affected area 45 g 12  . Cyanocobalamin (B-12 PO) Take 1 tablet by mouth daily.    [name] levothyroxine (SYNTHROID, LEVOTHROID) 88 MCG tablet Take 88 mcg by mouth daily before breakfast.    . metoprolol succinate (TOPROL-XL) 25 MG 24 hr tablet Take 25 mg by mouth daily.    . predniSONE (DELTASONE)  10 MG tablet prednisone 10 mg tablet  Take 1-2 tablets po qd    . rosuvastatin (CRESTOR) 40 MG tablet Take 0.5 tablets (20 mg total) by mouth daily for 365 doses. 45 tablet 3  . zolpidem (AMBIEN) 5 MG tablet Take 2.5 mg by mouth at bedtime as needed for sleep.     [name] amoxicillin (AMOXIL) 500 MG capsule TAKE 4 TABLETS 30 60 MINUTES PRIOR TO DENTAL WORK    . nitroGLYCERIN (NITROSTAT) 0.4 MG SL tablet Place 0.4 mg under the tongue every 5 (five) minutes as needed for chest pain.     No current facility-administered medications for this visit.    Family History  Problem Relation Age of Onset  . Breast cancer Maternal Grandmother   . Heart attack Maternal  Grandmother   . Aortic aneurysm Mother   . Clotting disorder Mother   . Breast cancer Maternal Aunt 39  . Heart disease Brother        stints  . Healthy Father     ROS:  Pertinent items are noted in HPI.  Otherwise, a comprehensive ROS was negative.  Exam:   BP 114/68   Pulse 70   Temp (!) 97 F (36.1 C) (Skin)   Resp 16   Ht 5\' 5"  (1.651 m)   Wt 181 lb (82.1 kg)   BMI 30.12 kg/m  Height: 5\' 5"  (165.1 cm) Ht Readings from Last 3 Encounters:  05/09/19 5\' 5"  (1.651 m)  09/15/18 5' 2.5" (1.588 m)  05/04/18 5' 5.25" (1.657 m)    General appearance: alert, cooperative and appears stated age Head: Normocephalic, without obvious abnormality, atraumatic Neck: no adenopathy, supple, symmetrical, trachea midline and thyroid normal to inspection and palpation Lungs: clear to auscultation bilaterally Breasts: normal appearance, no masses or tenderness, No nipple retraction or dimpling, No nipple discharge or bleeding, No axillary or supraclavicular adenopathy Heart: regular rate and rhythm Abdomen: soft, non-tender; no masses,  no organomegaly Extremities: extremities normal, atraumatic, no cyanosis or edema Skin: Skin color, texture, turgor normal. No rashes or lesions Lymph nodes: Cervical, supraclavicular, and axillary nodes normal. 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  Patient aware to treat with Clobetasol when flare occurs in vaginal area. Has Rx.  Continue follow up with MD as indicated and for BMD  Pap smear: no   counseled on breast self exam, mammography screening, feminine hygiene, adequate intake of calcium and vitamin D, diet and exercise, Kegel's exercises  return annually or prn  An After Visit Summary was printed and given to the patient.

## 2019-05-09 NOTE — Patient Instructions (Signed)
EXERCISE AND DIET:  We recommended that you start or continue a regular exercise program for good health. Regular exercise means any activity that makes your heart beat faster and makes you sweat.  We recommend exercising at least 30 minutes per day at least 3 days a week, preferably 4 or 5.  We also recommend a diet low in fat and sugar.  Inactivity, poor dietary choices and obesity can cause diabetes, heart attack, stroke, and kidney damage, among others.    ALCOHOL AND SMOKING:  Women should limit their alcohol intake to no more than 7 drinks/beers/glasses of wine (combined, not each!) per week. Moderation of alcohol intake to this level decreases your risk of breast cancer and liver damage. And of course, no recreational drugs are part of a healthy lifestyle.  And absolutely no smoking or even second hand smoke. Most people know smoking can cause heart and lung diseases, but did you know it also contributes to weakening of your bones? Aging of your skin?  Yellowing of your teeth and nails?  CALCIUM AND VITAMIN D:  Adequate intake of calcium and Vitamin D are recommended.  The recommendations for exact amounts of these supplements seem to change often, but generally speaking 600 mg of calcium (either carbonate or citrate) and 800 units of Vitamin D per day seems prudent. Certain women may benefit from higher intake of Vitamin D.  If you are among these women, your doctor will have told you during your visit.    PAP SMEARS:  Pap smears, to check for cervical cancer or precancers,  have traditionally been done yearly, although recent scientific advances have shown that most women can have pap smears less often.  However, every woman still should have a physical exam from her gynecologist every year. It will include a breast check, inspection of the vulva and vagina to check for abnormal growths or skin changes, a visual exam of the cervix, and then an exam to evaluate the size and shape of the uterus and  ovaries.  And after 78 years of age, a rectal exam is indicated to check for rectal cancers. We will also provide age appropriate advice regarding health maintenance, like when you should have certain vaccines, screening for sexually transmitted diseases, bone density testing, colonoscopy, mammograms, etc.   MAMMOGRAMS:  All women over 40 years old should have a yearly mammogram. Many facilities now offer a "3D" mammogram, which may cost around $50 extra out of pocket. If possible,  we recommend you accept the option to have the 3D mammogram performed.  It both reduces the number of women who will be called back for extra views which then turn out to be normal, and it is better than the routine mammogram at detecting truly abnormal areas.    COLONOSCOPY:  Colonoscopy to screen for colon cancer is recommended for all women at age 50.  We know, you hate the idea of the prep.  We agree, BUT, having colon cancer and not knowing it is worse!!  Colon cancer so often starts as a polyp that can be seen and removed at colonscopy, which can quite literally save your life!  And if your first colonoscopy is normal and you have no family history of colon cancer, most women don't have to have it again for 10 years.  Once every ten years, you can do something that may end up saving your life, right?  We will be happy to help you get it scheduled when you are ready.    Be sure to check your insurance coverage so you understand how much it will cost.  It may be covered as a preventative service at no cost, but you should check your particular policy.      Lichen Sclerosus Lichen sclerosus is a skin problem. It can happen on any part of the body, but it commonly involves the anal or genital areas. It can cause itching and discomfort in these areas. Treatment can help to control symptoms. When the genital area is affected, getting treatment is important because the condition can cause scarring that may lead to other  problems. What are the causes? The cause of this condition is not known. It may be related to an overactive immune system or a lack of certain hormones. Lichen sclerosus is not an infection or a fungus, and it is not passed from one person to another (not contagious). What increases the risk? This condition is more likely to develop in women, usually after menopause. What are the signs or symptoms? Symptoms of this condition include:  Thin, wrinkled, white areas on the skin.  Thickened white areas on the skin.  Red and swollen patches (lesions) on the skin.  Tears or cracks in the skin.  Bruising.  Blood blisters.  Severe itching.  Pain, itching, or burning when urinating. Constipation is also common in people with lichen sclerosus. How is this diagnosed? This condition may be diagnosed with a physical exam. In some cases, a tissue sample (biopsy sample) may be removed to be looked at under a microscope. How is this treated? This condition is usually treated with medicated creams or ointments (topical steroids) that are applied over the affected areas. In some cases, treatment may also include medicines that are taken by mouth. Surgery may be needed in more severe cases that are causing problems such as scarring. Follow these instructions at home:  Take or use over-the-counter and prescription medicines only as told by your health care provider.  Use creams or ointments as told by your health care provider.  Do not scratch the affected areas of skin.  If you are a woman, be sure to keep the vaginal area as clean and dry as possible.  Clean the affected area of skin gently with water. Avoid using rough towels or toilet paper.  Keep all follow-up visits as told by your health care provider. This is important. Contact a health care provider if:  You have increasing redness, swelling, or pain in the affected area.  You have fluid, blood, or pus coming from the affected  area.  You have new lesions on your skin.  You have a fever.  You have pain during sex. Summary  Lichen sclerosus is a skin problem. When the genital area is affected, getting treatment is important because the condition can cause scarring that may lead to other problems.  This condition is usually treated with medicated creams or ointments (topical steroids) that are applied over the affected areas.  Take or use over-the-counter and prescription medicines only as told by your health care provider.  Contact a health care provider if you have new lesions on your skin, have pain during sex, or have increasing redness, swelling, or pain in the affected area.  Keep all follow-up visits as told by your health care provider. This is important. This information is not intended to replace advice given to you by your health care provider. Make sure you discuss any questions you have with your health care provider. Document Revised: 08/12/2017  Document Reviewed: 08/12/2017 Elsevier Patient Education  [employer].

## 2019-05-23 ENCOUNTER — Ambulatory Visit: Payer: Medicare Other | Attending: Internal Medicine

## 2019-05-23 DIAGNOSIS — Z23 Encounter for immunization: Secondary | ICD-10-CM | POA: Insufficient documentation

## 2019-05-23 NOTE — Progress Notes (Signed)
   Covid-19 Vaccination Clinic  Name:  Isata Woolf    MRN: [id number] DOB: [birth year]  05/23/2019  Ms. Lodes was observed post Covid-19 immunization for 15 minutes without incidence. She was provided with Vaccine Information Sheet and instruction to access the V-Safe system.   Ms. Wisse was instructed to call 911 with any severe reactions post vaccine: [name] Difficulty breathing  . Swelling of your face and throat  . A fast heartbeat  . A bad rash all over your body  . Dizziness and weakness    Immunizations Administered    Name Date Dose VIS Date Route   Pfizer COVID-19 Vaccine 05/23/2019  8:23 AM 0.3 mL 03/24/2019 Intramuscular   Manufacturer: [place]   Lot: [id number]   [place]: [id number]

## 2019-05-29 DIAGNOSIS — R7301 Impaired fasting glucose: Secondary | ICD-10-CM | POA: Diagnosis not present

## 2019-05-29 DIAGNOSIS — E7849 Other hyperlipidemia: Secondary | ICD-10-CM | POA: Diagnosis not present

## 2019-05-29 DIAGNOSIS — E038 Other specified hypothyroidism: Secondary | ICD-10-CM | POA: Diagnosis not present

## 2019-05-29 DIAGNOSIS — E538 Deficiency of other specified B group vitamins: Secondary | ICD-10-CM | POA: Diagnosis not present

## 2019-05-29 DIAGNOSIS — M859 Disorder of bone density and structure, unspecified: Secondary | ICD-10-CM | POA: Diagnosis not present

## 2019-05-29 DIAGNOSIS — M791 Myalgia, unspecified site: Secondary | ICD-10-CM | POA: Diagnosis not present

## 2019-06-02 DIAGNOSIS — K921 Melena: Secondary | ICD-10-CM | POA: Diagnosis not present

## 2019-06-05 ENCOUNTER — Ambulatory Visit (INDEPENDENT_AMBULATORY_CARE_PROVIDER_SITE_OTHER): Payer: Medicare Other | Admitting: *Deleted

## 2019-06-05 DIAGNOSIS — Z0000 Encounter for general adult medical examination without abnormal findings: Secondary | ICD-10-CM | POA: Diagnosis not present

## 2019-06-05 DIAGNOSIS — R002 Palpitations: Secondary | ICD-10-CM | POA: Diagnosis not present

## 2019-06-05 DIAGNOSIS — I2510 Atherosclerotic heart disease of native coronary artery without angina pectoris: Secondary | ICD-10-CM | POA: Diagnosis not present

## 2019-06-05 DIAGNOSIS — R000 Tachycardia, unspecified: Secondary | ICD-10-CM | POA: Diagnosis not present

## 2019-06-05 DIAGNOSIS — R531 Weakness: Secondary | ICD-10-CM | POA: Diagnosis not present

## 2019-06-05 DIAGNOSIS — I350 Nonrheumatic aortic (valve) stenosis: Secondary | ICD-10-CM | POA: Diagnosis not present

## 2019-06-05 DIAGNOSIS — R011 Cardiac murmur, unspecified: Secondary | ICD-10-CM | POA: Diagnosis not present

## 2019-06-05 DIAGNOSIS — Z1331 Encounter for screening for depression: Secondary | ICD-10-CM | POA: Diagnosis not present

## 2019-06-05 DIAGNOSIS — D649 Anemia, unspecified: Secondary | ICD-10-CM | POA: Diagnosis not present

## 2019-06-05 DIAGNOSIS — I480 Paroxysmal atrial fibrillation: Secondary | ICD-10-CM

## 2019-06-05 DIAGNOSIS — M791 Myalgia, unspecified site: Secondary | ICD-10-CM | POA: Diagnosis not present

## 2019-06-05 DIAGNOSIS — M2550 Pain in unspecified joint: Secondary | ICD-10-CM | POA: Diagnosis not present

## 2019-06-05 DIAGNOSIS — M8580 Other specified disorders of bone density and structure, unspecified site: Secondary | ICD-10-CM | POA: Diagnosis not present

## 2019-06-05 DIAGNOSIS — R82998 Other abnormal findings in urine: Secondary | ICD-10-CM | POA: Diagnosis not present

## 2019-06-05 LAB — CUP PACEART REMOTE DEVICE CHECK
Date Time Interrogation Session: 20210222003113
Implantable Pulse Generator Implant Date: 20190501

## 2019-06-06 NOTE — Progress Notes (Signed)
ILR Remote 

## 2019-06-16 DIAGNOSIS — M8580 Other specified disorders of bone density and structure, unspecified site: Secondary | ICD-10-CM | POA: Diagnosis not present

## 2019-06-16 DIAGNOSIS — Z7952 Long term (current) use of systemic steroids: Secondary | ICD-10-CM | POA: Diagnosis not present

## 2019-06-16 DIAGNOSIS — M353 Polymyalgia rheumatica: Secondary | ICD-10-CM | POA: Diagnosis not present

## 2019-06-23 DIAGNOSIS — Z1231 Encounter for screening mammogram for malignant neoplasm of breast: Secondary | ICD-10-CM | POA: Diagnosis not present

## 2019-07-03 ENCOUNTER — Telehealth: Payer: Self-pay | Admitting: Internal Medicine

## 2019-07-03 NOTE — Telephone Encounter (Signed)
  1. Has your device fired? No   2. Is you device beeping? No   3. Are you experiencing draining or swelling at device site? No   4. Are you calling to see if we received your device transmission? Yes   5. Have you passed out? No   Marissa Powers is calling stating when she tries to send in a transmission for irregular heartbeat her device turns red and won't go through. Please advise.    Please route to [place]

## 2019-07-03 NOTE — Telephone Encounter (Signed)
The pt states she felts some irregular beats. She tried sending a transmission but the screen turns red. I gave her the number to [employer] tech support to get additional help. I told her once the we receive the manual transmission the nurse will review it.

## 2019-07-03 NOTE — Telephone Encounter (Signed)
LMOM to call device clinic. # provided. Transmission from [employer] received 07/03/19. Patient reported she had palpitations and sent remote that shows SR as presenting and no recorded arrythmias since 03/04/19.

## 2019-07-04 ENCOUNTER — Encounter: Payer: Self-pay | Admitting: Certified Nurse Midwife

## 2019-07-04 DIAGNOSIS — R519 Headache, unspecified: Secondary | ICD-10-CM | POA: Diagnosis not present

## 2019-07-04 DIAGNOSIS — E669 Obesity, unspecified: Secondary | ICD-10-CM | POA: Diagnosis not present

## 2019-07-04 DIAGNOSIS — M353 Polymyalgia rheumatica: Secondary | ICD-10-CM | POA: Diagnosis not present

## 2019-07-04 DIAGNOSIS — M8580 Other specified disorders of bone density and structure, unspecified site: Secondary | ICD-10-CM | POA: Diagnosis not present

## 2019-07-04 DIAGNOSIS — R6884 Jaw pain: Secondary | ICD-10-CM | POA: Diagnosis not present

## 2019-07-04 DIAGNOSIS — Z7952 Long term (current) use of systemic steroids: Secondary | ICD-10-CM | POA: Diagnosis not present

## 2019-07-04 DIAGNOSIS — Z6831 Body mass index (BMI) 31.0-31.9, adult: Secondary | ICD-10-CM | POA: Diagnosis not present

## 2019-07-04 NOTE — Telephone Encounter (Signed)
The pt returned [place] phone call but I let her talk with device nurse Amy, rn.

## 2019-07-04 NOTE — Telephone Encounter (Signed)
A transmission did come through on 3/22, however it does not include complete details.  Attemtped to assist pt with manual send, received error messages.  Conferenced tech support on line, they determined that pt needs new hand held device which they are sending to her.  D/w pt current symptoms she is experiencing, she is currently feeling ok has had some palpitations lately but it does not sound frequent.  We will check manual transmission once she get new device.

## 2019-07-06 ENCOUNTER — Ambulatory Visit (INDEPENDENT_AMBULATORY_CARE_PROVIDER_SITE_OTHER): Payer: Medicare Other | Admitting: *Deleted

## 2019-07-06 DIAGNOSIS — I480 Paroxysmal atrial fibrillation: Secondary | ICD-10-CM | POA: Diagnosis not present

## 2019-07-06 LAB — CUP PACEART REMOTE DEVICE CHECK
Date Time Interrogation Session: 20210325013343
Implantable Pulse Generator Implant Date: 20190501

## 2019-07-07 NOTE — Progress Notes (Signed)
ILR Remote 

## 2019-07-07 NOTE — Telephone Encounter (Signed)
The pt states [employer] is sending her a new handheld handle for her monitor.

## 2019-07-13 DIAGNOSIS — H5713 Ocular pain, bilateral: Secondary | ICD-10-CM | POA: Diagnosis not present

## 2019-07-13 DIAGNOSIS — M353 Polymyalgia rheumatica: Secondary | ICD-10-CM | POA: Diagnosis not present

## 2019-07-14 NOTE — Telephone Encounter (Signed)
LMOVM to follow to see if she received her new handheld. I left my direct office number for the pt to call.

## 2019-07-20 NOTE — Telephone Encounter (Signed)
LMOVM just to f/u with the pt. Need to know if her new handheld was received.

## 2019-07-21 DIAGNOSIS — R6884 Jaw pain: Secondary | ICD-10-CM | POA: Diagnosis not present

## 2019-07-21 DIAGNOSIS — R519 Headache, unspecified: Secondary | ICD-10-CM | POA: Diagnosis not present

## 2019-07-21 DIAGNOSIS — Z6831 Body mass index (BMI) 31.0-31.9, adult: Secondary | ICD-10-CM | POA: Diagnosis not present

## 2019-07-21 DIAGNOSIS — M353 Polymyalgia rheumatica: Secondary | ICD-10-CM | POA: Diagnosis not present

## 2019-07-21 DIAGNOSIS — E669 Obesity, unspecified: Secondary | ICD-10-CM | POA: Diagnosis not present

## 2019-07-21 DIAGNOSIS — Z7952 Long term (current) use of systemic steroids: Secondary | ICD-10-CM | POA: Diagnosis not present

## 2019-07-21 DIAGNOSIS — M316 Other giant cell arteritis: Secondary | ICD-10-CM | POA: Diagnosis not present

## 2019-07-21 DIAGNOSIS — M8580 Other specified disorders of bone density and structure, unspecified site: Secondary | ICD-10-CM | POA: Diagnosis not present

## 2019-07-21 NOTE — Telephone Encounter (Signed)
LMOVM for pt to return my phone call.  

## 2019-07-21 NOTE — Telephone Encounter (Signed)
Pt states she did receive her new handheld. We sent a manual transmission. I told her the nurse will review it and if she see anything she will call back. I told her if it looks normal the nurse will not call back. No news is good news.

## 2019-07-25 DIAGNOSIS — G8929 Other chronic pain: Secondary | ICD-10-CM | POA: Diagnosis not present

## 2019-07-25 DIAGNOSIS — R519 Headache, unspecified: Secondary | ICD-10-CM | POA: Diagnosis not present

## 2019-07-31 ENCOUNTER — Telehealth: Payer: Self-pay

## 2019-07-31 NOTE — Telephone Encounter (Signed)
[employer] alert recieved for ongoing AF episode on 07/28/19.  A second transmission came in on 4/17 indicating pt out of AF.  Episode lasted for almost 7 hours total.    Spoke with pt, she reports she was aware of episode including feeling like her heart was racing.  She took an extra metoprolol which did not seem to help it at the time, however upon waking 4/17 she was no longer symptomatic.    Pt reports that only changes she has had recently was an increase in Prednisone about 3-4 weeks ago due to RA symptoms.  She also notes that she was supposed to have had a Temporal artery biopsy tomorrow with the ENT.  She had been instructed to hold her Eliquis as of 4/18.  Due to AF episode she did not hold her Eliquis.  She will be notifying ENT today that her procedure will need to be postponed.    Pt requested advice from Dr. Rayann Heman as to when it will be safe for her to hold her Eliquis for this procedure.

## 2019-08-07 DIAGNOSIS — M8589 Other specified disorders of bone density and structure, multiple sites: Secondary | ICD-10-CM | POA: Diagnosis not present

## 2019-08-07 LAB — CUP PACEART REMOTE DEVICE CHECK
Date Time Interrogation Session: 20210425013251
Implantable Pulse Generator Implant Date: 20190501

## 2019-08-08 ENCOUNTER — Ambulatory Visit (INDEPENDENT_AMBULATORY_CARE_PROVIDER_SITE_OTHER): Payer: Medicare Other | Admitting: *Deleted

## 2019-08-08 DIAGNOSIS — I480 Paroxysmal atrial fibrillation: Secondary | ICD-10-CM | POA: Diagnosis not present

## 2019-08-09 NOTE — Progress Notes (Signed)
ILR Remote 

## 2019-08-24 DIAGNOSIS — R6884 Jaw pain: Secondary | ICD-10-CM | POA: Diagnosis not present

## 2019-08-24 DIAGNOSIS — Z6829 Body mass index (BMI) 29.0-29.9, adult: Secondary | ICD-10-CM | POA: Diagnosis not present

## 2019-08-24 DIAGNOSIS — R519 Headache, unspecified: Secondary | ICD-10-CM | POA: Diagnosis not present

## 2019-08-24 DIAGNOSIS — M316 Other giant cell arteritis: Secondary | ICD-10-CM | POA: Diagnosis not present

## 2019-08-24 DIAGNOSIS — E663 Overweight: Secondary | ICD-10-CM | POA: Diagnosis not present

## 2019-08-24 DIAGNOSIS — Z7952 Long term (current) use of systemic steroids: Secondary | ICD-10-CM | POA: Diagnosis not present

## 2019-08-24 DIAGNOSIS — M8580 Other specified disorders of bone density and structure, unspecified site: Secondary | ICD-10-CM | POA: Diagnosis not present

## 2019-08-24 DIAGNOSIS — M353 Polymyalgia rheumatica: Secondary | ICD-10-CM | POA: Diagnosis not present

## 2019-08-27 NOTE — Telephone Encounter (Signed)
Ok to hold Eliquis for procedure. Would recommend holding 24 hours prior to procedure and resuming when clinically able afterwards.   Thompson Grayer, MD

## 2019-08-28 ENCOUNTER — Other Ambulatory Visit: Payer: Self-pay

## 2019-08-28 ENCOUNTER — Ambulatory Visit (HOSPITAL_COMMUNITY)
Admission: RE | Admit: 2019-08-28 | Discharge: 2019-08-28 | Disposition: A | Discharge status: Home / Self Care | Payer: Medicare Other | Source: Ambulatory Visit | Attending: Physician Assistant | Admitting: Physician Assistant

## 2019-08-28 ENCOUNTER — Telehealth: Payer: Self-pay

## 2019-08-28 VITALS — BP 118/86 | HR 79 | Ht 65.0 in | Wt 177.0 lb

## 2019-08-28 DIAGNOSIS — Z79890 Hormone replacement therapy (postmenopausal): Secondary | ICD-10-CM | POA: Insufficient documentation

## 2019-08-28 DIAGNOSIS — Z87410 Personal history of cervical dysplasia: Secondary | ICD-10-CM | POA: Diagnosis not present

## 2019-08-28 DIAGNOSIS — Z79899 Other long term (current) drug therapy: Secondary | ICD-10-CM | POA: Diagnosis not present

## 2019-08-28 DIAGNOSIS — M1990 Unspecified osteoarthritis, unspecified site: Secondary | ICD-10-CM | POA: Insufficient documentation

## 2019-08-28 DIAGNOSIS — E890 Postprocedural hypothyroidism: Secondary | ICD-10-CM | POA: Insufficient documentation

## 2019-08-28 DIAGNOSIS — E7800 Pure hypercholesterolemia, unspecified: Secondary | ICD-10-CM | POA: Insufficient documentation

## 2019-08-28 DIAGNOSIS — D6869 Other thrombophilia: Secondary | ICD-10-CM | POA: Insufficient documentation

## 2019-08-28 DIAGNOSIS — I480 Paroxysmal atrial fibrillation: Secondary | ICD-10-CM | POA: Insufficient documentation

## 2019-08-28 DIAGNOSIS — I4891 Unspecified atrial fibrillation: Secondary | ICD-10-CM | POA: Diagnosis present

## 2019-08-28 DIAGNOSIS — Z7901 Long term (current) use of anticoagulants: Secondary | ICD-10-CM | POA: Insufficient documentation

## 2019-08-28 DIAGNOSIS — Z8249 Family history of ischemic heart disease and other diseases of the circulatory system: Secondary | ICD-10-CM | POA: Insufficient documentation

## 2019-08-28 DIAGNOSIS — Z96653 Presence of artificial knee joint, bilateral: Secondary | ICD-10-CM | POA: Insufficient documentation

## 2019-08-28 DIAGNOSIS — I10 Essential (primary) hypertension: Secondary | ICD-10-CM | POA: Diagnosis not present

## 2019-08-28 MED ORDER — METOPROLOL TARTRATE 25 MG PO TABS
25.0000 mg | ORAL_TABLET | Freq: Four times a day (QID) | ORAL | 1 refills | Status: DC | PRN
Start: 2019-08-28 — End: 2021-07-13

## 2019-08-28 NOTE — Telephone Encounter (Signed)
Called and spoke with patient.  She is agreeable to appt today @3 :30 pm with Adline Peals, PA.

## 2019-08-28 NOTE — Telephone Encounter (Signed)
Linq alert received- Ongoing AF episode appears with RVR since 2020 last night.    Pt has known history of AF on Eliquis for [place].  Current meds include Metoprolol 25mg  daily.    Spoke with pt.  She is feeling very lousy today. She reports weakness.  She has been under a lot of stress as she is having her dog put to sleep today.  She is also scheduled to leave on vacation tomorrow.    While on phone pt sent manual transmission, she is still in AF with RVR.  Advised Ed may be most appropriate place for her.  She indicated she has an appt this afternoon for her dog that she needs to keep.   Advised I will send a message to AF clinic to see if they can work her in today but otherwise she may need to go to ED.

## 2019-08-28 NOTE — Progress Notes (Signed)
Primary Care Physician: Crist Infante, MD Referring Physician: Dr. Joylene Draft Primary Cardiologist: Dr Tamala Julian Primary EP: Dr Rayann Heman   Marissa Powers Marissa Powers is a 79 y.o. female with a h/o palpitations, htn, hypothyroidism, CAD.She saw Dr. Joylene Draft, 4/29, for her annual physical and mentioned rapid heart beat in the middle of the night, which sounded suspicious  for afib and referred here for further evaluation. The pt brings in recorded  V/S, where she had an episode for around 4 hours with HR's in the 100-120 bpm range in March. She went around 4-6  weeks before she had similar symptoms. She states that she may snore, but she is a widow so no one in the house to report apnea. She reports that she sleeps well and does not have daytime somnolence. She does drink alcohol several times a week, 1-2 drinks at a time. She has been on metoprolol for years for palpitations and will usually take an extra 1/2 tab of metoprolol when she has an episode. She takes a baby ASA a day. Chadsvasc score is at least 4. No tobacco use, minimal caffeine use. Her son is a Secondary school teacher in [place].  On follow up today, patient reports that she felt she was back in afib yesterday evening. This was confirmed by her ILR. She has been on prednisone recently. She has also been under a significant amount of stress because she is putting her dog down today. However, about 45 minutes prior to her appt today, she felt she was back in SR.   Today, she denies symptoms of chest pain, shortness of breath, orthopnea, PND, lower extremity edema, dizziness, presyncope, syncope, or neurologic sequela. The patient is tolerating medications without difficulties and is otherwise without complaint today.   Past Medical History:  Diagnosis Date  . Arthritis    osteoarthritis  . Atrial fibrillation ([place])   . Dysplasia of cervix, low grade (CIN 1) 1990   HPV, Cryo, Laser  . 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 Fibroid 2002   1 cm, 2  cm  . GERD (gastroesophageal reflux disease)   . Hypercholesteremia   . Hypertension   . Hyperthyroidism    Past Surgical History:  Procedure Laterality Date  . BREAST BIOPSY Bilateral 1970's   x2 one a side  . CATARACT EXTRACTION     bilateral  . colon polyp removal    . LOOP RECORDER INSERTION N/A 08/11/2017   Procedure: LOOP RECORDER INSERTION;  Surgeon: Thompson Grayer, MD;  Location: [facility];  Service: Cardiovascular;  Laterality: N/A;  . RETINAL DETACHMENT SURGERY  2000  . THYROIDECTOMY    . TONSILLECTOMY    . TOTAL KNEE ARTHROPLASTY  04/25/2012   Procedure: TOTAL KNEE BILATERAL;  Surgeon: Gearlean Alf, MD;  Location: [facility];  Service: Orthopedics;  Laterality: Bilateral;    Current Outpatient Medications  Medication Sig Dispense Refill  . amoxicillin (AMOXIL) 500 MG capsule TAKE 4 TABLETS 30 60 MINUTES PRIOR TO DENTAL WORK    . apixaban (ELIQUIS) 5 MG TABS tablet Take 1 tablet (5 mg total) by mouth 2 (two) times daily. 60 tablet 11  . CALCIUM PO Take 1 tablet by mouth daily.     . Cholecalciferol (VITAMIN D PO) Take 1 tablet by mouth daily.    . clobetasol ointment (TEMOVATE) 0.05 % Pea size amount as needed to affected area 45 g 12  . Cyanocobalamin (B-12 PO) Take 1 tablet by mouth daily.    [name]  levothyroxine (SYNTHROID, LEVOTHROID) 88 MCG tablet Take 88 mcg by mouth daily before breakfast.    . metoprolol succinate (TOPROL-XL) 25 MG 24 hr tablet Take 25 mg by mouth daily.    . nitroGLYCERIN (NITROSTAT) 0.4 MG SL tablet Place 0.4 mg under the tongue every 5 (five) minutes as needed for chest pain.    . predniSONE (DELTASONE) 10 MG tablet prednisone 10 mg tablet  Take 1-2 tablets po qd    . rosuvastatin (CRESTOR) 40 MG tablet Take 0.5 tablets (20 mg total) by mouth daily for 365 doses. 45 tablet 3  . zolpidem (AMBIEN) 5 MG tablet Take 2.5 mg by mouth at bedtime as needed for sleep.      No current facility-administered medications for this visit.    Allergies    Allergen Reactions  . Cashew Nut Oil Other (See Comments)    Tingley, swelling Tingley, swelling    Social History   Socioeconomic History  . Marital status: Widowed    Spouse name: Not on file  . Number of children: Not on file  . Years of education: Not on file  . Highest education level: Not on file  Occupational History  . Not on file  Tobacco Use  . Smoking status: Never Smoker  . Smokeless tobacco: Never Used  Substance and Sexual Activity  . Alcohol use: Yes    Alcohol/week: 4.0 - 6.0 standard drinks    Types: 4 - 6 Standard drinks or equivalent per week  . Drug use: No  . Sexual activity: Not Currently    Partners: Male    Birth control/protection: Post-menopausal  Other Topics Concern  . Not on file  Social History Narrative  . Not on file   Social Determinants of Health   Financial Resource Strain:   . Difficulty of Paying Living Expenses:   Food Insecurity:   . Worried About Charity fundraiser in the Last Year:   . Arboriculturist in the Last Year:   Transportation Needs:   . Film/video editor (Medical):   [name] Lack of Transportation (Non-Medical):   Physical Activity:   . Days of Exercise per Week:   . Minutes of Exercise per Session:   Stress:   . Feeling of Stress :   Social Connections:   . Frequency of Communication with Friends and Family:   . Frequency of Social Gatherings with Friends and Family:   . Attends Religious Services:   . Active Member of Clubs or Organizations:   . Attends Archivist Meetings:   [name] Marital Status:   Intimate Partner Violence:   . Fear of Current or Ex-Partner:   . Emotionally Abused:   [name] Physically Abused:   . Sexually Abused:     Family History  Problem Relation Age of Onset  . Breast cancer Maternal Grandmother   . Heart attack Maternal Grandmother   . Aortic aneurysm Mother   . Clotting disorder Mother   . Breast cancer Maternal Aunt 47  . Heart disease Brother        stints  .  Healthy Father     ROS- All systems are reviewed and negative except as per the HPI above  Physical Exam: There were no vitals filed for this visit. Wt Readings from Last 3 Encounters:  05/09/19 181 lb (82.1 kg)  09/15/18 182 lb (82.6 kg)  05/04/18 182 lb (82.6 kg)    Labs: Lab Results  Component Value Date   NA 142  10/11/2017   K 4.4 10/11/2017   CL 104 10/11/2017   CO2 21 10/11/2017   GLUCOSE 91 10/11/2017   BUN 23 10/11/2017   CREATININE 0.70 11/30/2017   CALCIUM 9.7 10/11/2017   Lab Results  Component Value Date   INR 1.77 (H) 04/29/2012   Lab Results  Component Value Date   CHOL 122 10/28/2018   HDL 55 10/28/2018   LDLCALC 45 10/28/2018   TRIG 108 10/28/2018    GEN- The patient is well appearing elderly female, alert and oriented x 3 today.   HEENT-head normocephalic, atraumatic, sclera clear, conjunctiva pink, hearing intact, trachea midline. Lungs- Clear to ausculation bilaterally, normal work of breathing Heart- Regular rate and rhythm, no murmurs, rubs or gallops  GI- soft, NT, ND, + BS Extremities- no clubbing, cyanosis, or edema MS- no significant deformity or atrophy Skin- no rash or lesion Psych- euthymic mood, full affect Neuro- strength and sensation are intact   EKG- SR HR 79, PR 156, QRS 72, QTc 426  Dr. Silvestre Mesi notes reviewed   Assessment and Plan: 1. Paroxysmal atrial fibrillation  Patient back in SR. Continue Toprol 25 mg daily Will start Lopressor 25 mg q6 hours PRN for heart racing. Continue Eliquis 5 mg BID  2. Secondary hypercoagulable state This patients CHA2DS2-VASc Score and unadjusted Ischemic Stroke Rate (% per year) is equal to 7.2 % stroke rate/year from a score of 5  3. HTN Stable, no changes today.   Follow up in the AF clinic in 3 months.    [facility] [address] [place], [place] [address] [phone]

## 2019-08-28 NOTE — Patient Instructions (Signed)
Start Lopressor 25mg  -every 6 hours as needed for heart racing- for Heart rate greater than 100 and the top number blood pressure needs to be over 100

## 2019-09-01 DIAGNOSIS — I4891 Unspecified atrial fibrillation: Secondary | ICD-10-CM | POA: Diagnosis not present

## 2019-09-01 DIAGNOSIS — R002 Palpitations: Secondary | ICD-10-CM | POA: Diagnosis not present

## 2019-09-01 DIAGNOSIS — R42 Dizziness and giddiness: Secondary | ICD-10-CM | POA: Diagnosis not present

## 2019-09-01 DIAGNOSIS — R5383 Other fatigue: Secondary | ICD-10-CM | POA: Diagnosis not present

## 2019-09-01 DIAGNOSIS — Z7901 Long term (current) use of anticoagulants: Secondary | ICD-10-CM | POA: Diagnosis not present

## 2019-09-05 ENCOUNTER — Other Ambulatory Visit: Payer: Self-pay

## 2019-09-05 ENCOUNTER — Encounter: Payer: Self-pay | Admitting: Cardiology

## 2019-09-05 ENCOUNTER — Ambulatory Visit (INDEPENDENT_AMBULATORY_CARE_PROVIDER_SITE_OTHER): Payer: Medicare Other | Admitting: Cardiology

## 2019-09-05 VITALS — BP 110/60 | HR 84 | Ht 65.0 in | Wt 181.0 lb

## 2019-09-05 DIAGNOSIS — E7800 Pure hypercholesterolemia, unspecified: Secondary | ICD-10-CM

## 2019-09-05 DIAGNOSIS — I10 Essential (primary) hypertension: Secondary | ICD-10-CM

## 2019-09-05 DIAGNOSIS — R0602 Shortness of breath: Secondary | ICD-10-CM

## 2019-09-05 DIAGNOSIS — I25118 Atherosclerotic heart disease of native coronary artery with other forms of angina pectoris: Secondary | ICD-10-CM | POA: Diagnosis not present

## 2019-09-05 DIAGNOSIS — I480 Paroxysmal atrial fibrillation: Secondary | ICD-10-CM | POA: Diagnosis not present

## 2019-09-05 DIAGNOSIS — I350 Nonrheumatic aortic (valve) stenosis: Secondary | ICD-10-CM | POA: Diagnosis not present

## 2019-09-05 NOTE — Progress Notes (Signed)
Cardiology Office Note   Date:  09/05/2019   ID:  Kemberly, Kessenich [birth year], MRN [id number]  PCP:  Crist Infante, MD  Cardiologist:  DR Tamala Julian EP  Dr. Rayann Heman    Chief Complaint  Patient presents with  . Hypertension      History of Present Illness: Marissa Powers is a 79 y.o. female who presents for follow up after being seen in hospital in [place] for SOB and not feeling well.  She has a hx of hypertension, hyperlipidemia, atypical CP, syncope, PAF identified on Loop reorder, and moderated AS.  Last visit 09/15/18 telehealth:  Has weakness frequently - weak, lightheaded, and dyspneic.  She feels tired in her chest.  Not precipitated by physical activity or during workouts for walking.  Episodes can last 2 hours. Started <1 year ago.  She still remains quite active and is not being physically limited.  We discussed findings of recent echocardiogram which reveals moderate aortic stenosis not significantly different than prior.  She has had more episodes of PAF, she does know when she goes into.  Currently SR. But has had more SOB and her heart pounding though no a fib.  In [place] urgent care thought she had MI so sent to hospital but EKG stable. SR and CXR no acute findings. WBC 8.7 Hgb 13.2 plts 199 Na 139, K+ 4.0 Cr 0.8 pro BNP 475.  Troponin <0.05   Also hx of  asymptomatic CAD on her cardiac CTA  LINq with PAF  On eliquis  Past Medical History:  Diagnosis Date  . Aortic stenosis, moderate   . Arthritis    osteoarthritis  . Atrial fibrillation ([place])   . Dysplasia of cervix, low grade (CIN 1) 1990   HPV, Cryo, Laser  . 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 Fibroid 2002   1 cm, 2 cm  . GERD (gastroesophageal reflux disease)   . Hypercholesteremia   . Hypertension   . Hyperthyroidism     Past Surgical History:  Procedure Laterality Date  . BREAST BIOPSY Bilateral 1970's   x2 one a side  . CATARACT EXTRACTION     bilateral  . colon polyp  removal    . LOOP RECORDER INSERTION N/A 08/11/2017   Procedure: LOOP RECORDER INSERTION;  Surgeon: Thompson Grayer, MD;  Location: [facility];  Service: Cardiovascular;  Laterality: N/A;  . RETINAL DETACHMENT SURGERY  2000  . THYROIDECTOMY    . TONSILLECTOMY    . TOTAL KNEE ARTHROPLASTY  04/25/2012   Procedure: TOTAL KNEE BILATERAL;  Surgeon: Gearlean Alf, MD;  Location: [facility];  Service: Orthopedics;  Laterality: Bilateral;     Current Outpatient Medications  Medication Sig Dispense Refill  . alendronate (FOSAMAX) 70 MG tablet Take 70 mg by mouth once a week.    [name] amoxicillin (AMOXIL) 500 MG capsule TAKE 4 TABLETS 30 60 MINUTES PRIOR TO DENTAL WORK    . apixaban (ELIQUIS) 5 MG TABS tablet Take 1 tablet (5 mg total) by mouth 2 (two) times daily. 60 tablet 11  . CALCIUM PO Take 1 tablet by mouth daily.     . Cholecalciferol (VITAMIN D PO) Take 1 tablet by mouth daily.    . Cyanocobalamin (B-12 PO) Take 1 tablet by mouth daily.    [name] esomeprazole (NEXIUM) 20 MG capsule 40 mg in the morning and at bedtime.     [name] levothyroxine (SYNTHROID, LEVOTHROID) 88 MCG tablet Take 88 mcg  by mouth daily before breakfast.    . metoprolol succinate (TOPROL-XL) 25 MG 24 hr tablet Take 25 mg by mouth daily.    . metoprolol tartrate (LOPRESSOR) 25 MG tablet Take 1 tablet (25 mg total) by mouth every 6 (six) hours as needed (heart racing). 45 tablet 1  . nitroGLYCERIN (NITROSTAT) 0.4 MG SL tablet Place 0.4 mg under the tongue every 5 (five) minutes as needed for chest pain.    [name] PREDNISONE PO Taking 20mg  by mouth daily    . REPATHA SURECLICK [id number] MG/ML SOAJ [id number] Milligram(s) SUB-Q Every 2 Weeks    . zolpidem (AMBIEN) 5 MG tablet Take 2.5 mg by mouth at bedtime as needed for sleep.      No current facility-administered medications for this visit.    Allergies:   Cashew nut oil    Social History:  The patient  reports that she has never smoked. She has never used smokeless tobacco. She reports  current alcohol use of about 4.0 - 6.0 standard drinks of alcohol per week. She reports that she does not use drugs.   Family History:  The patient's family history includes Aortic aneurysm in her mother; Breast cancer in her maternal grandmother; Breast cancer (age of onset: 76) in her maternal aunt; Clotting disorder in her mother; Healthy in her father; Heart attack in her maternal grandmother; Heart disease in her brother.    ROS:  General:no colds or fevers, no weight changes Skin:no rashes or ulcers HEENT:no blurred vision, no congestion CV:see HPI PUL:see HPI GI:no diarrhea constipation or melena, no indigestion GU:no hematuria, no dysuria MS:no joint pain, no claudication Neuro:no syncope, no lightheadedness Endo:no diabetes, + thyroid disease  Wt Readings from Last 3 Encounters:  09/05/19 181 lb (82.1 kg)  08/28/19 177 lb (80.3 kg)  05/09/19 181 lb (82.1 kg)     PHYSICAL EXAM: VS:  BP 110/60   Pulse 84   Ht 5\' 5"  (1.651 m)   Wt 181 lb (82.1 kg)   SpO2 97%   BMI 30.12 kg/m  , BMI Body mass index is 30.12 kg/m. General:Pleasant affect, NAD Skin:Warm and dry, brisk capillary refill HEENT:normocephalic, sclera clear, mucus membranes moist Neck:supple, no JVD, no bruits  Heart:S1S2 RRR without murmur, gallup, rub or click Lungs:clear without rales, rhonchi, or wheezes [id number], non tender, + BS, do not palpate liver spleen or masses Ext:no lower ext edema, 2+ pedal pulses, 2+ radial pulses Neuro:alert and oriented X 3, MAE, follows commands, + facial symmetry    EKG:  EKG is NOT ordered today. The ekg from [facility] 09/01/19 demonstrates SR LVH and no acute changes from EKGs here.     Recent Labs: 10/28/2018: ALT 12; Hemoglobin WILL FOLLOW; Platelets WILL FOLLOW    Lipid Panel    Component Value Date/Time   CHOL 122 10/28/2018 0832   TRIG 108 10/28/2018 0832   HDL 55 10/28/2018 0832   CHOLHDL 2.2 10/28/2018 0832   LDLCALC 45 10/28/2018 0832       Other  studies Reviewed: Additional studies/ records that were reviewed today include: .  ECHOCARDIOGRAM 2020: IMPRESSIONS   1. The left ventricle has normal systolic function with an ejection fraction of 60-65%. The cavity size was normal. Left ventricular diastolic Doppler parameters are consistent with pseudonormalization. Elevated left ventricular end-diastolic pressure. 2. The right ventricle has normal systolic function. The cavity was mildly enlarged. There is no increase in right ventricular wall thickness. 3. Left atrial size was mild-moderately dilated. 4. The aortic valve  is tricuspid. Moderate thickening of the aortic valve. Moderate calcification of the aortic valve. Aortic valve regurgitation is trivial by color flow Doppler. Moderate stenosis of the aortic valve. 5. The average left ventricular global longitudinal strain is -21.1 %.  Coronary CT angiogram August 2019 IMPRESSION: 1. Coronary calcium score of 90. This was 14 percentile for age and sex matched control.  2. Normal coronary origin with right dominance.  3. Moderate stenosis in the proximal LAD. Additional analysis with CT FFR will be submitted.  ACCOMPANYING FFR August 2019:  1. Left Main: No significant stenosis.  2. LAD: Proximal: 0.95, distal: 0.81. 3. LCX: No significant stenosis. 4. RCA: No significant stenosis.  IMPRESSION: 1. CT FFR analysis showed borderline lesion in the proximal LAD, given that it is proximal LAD a cardiac catheterization should be Considered.  ASSESSMENT AND PLAN:  1. Increased dyspnea and pounding heart beat without a fib. Neg troponin on Friday. Will check echo first with her Moderate AS and will also discuss with Dr. Tamala Julian she did have cardiac CTA in 2019 - it may be to proceed with cardiac cath but would want to eval her AS.  Lungs are clear and CXR Friday with clear lungs. Pro BNp was elevated to 400   Review[facility] labs and EKGs from [facility]  2. PAF on eliquis and has  had a few episodes in last month.  Is seen in a fib clinic. Now SR on toprol XL and lopressor prn for a fib no bleeding on anticoagulation.   3. CAD has on cardiac CTA in 2019.  On repatha due to statin intolerance last LDL 05/2019 was 65 and HDL 45   4. 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 HTN with controlled BP on torpol        Current medicines are reviewed with the patient today.  The patient Has no concerns regarding medicines.  The following changes have been made:  See above Labs/ tests ordered today include:see above  Disposition:   FU:  see above  Signed, Cecilie Kicks, NP  09/05/2019 2:44 PM    [facility] [place], [place], [address], [place] Phone: [phone]; Fax: [phone]

## 2019-09-05 NOTE — Patient Instructions (Addendum)
Medication Instructions:  *If you need a refill on your cardiac medications before your next appointment, please call your pharmacy*  Lab Work: If you have labs (blood work) drawn today and your tests are completely normal, you will receive your results only by: [name] MyChart Message (if you have MyChart) OR . A paper copy in the mail If you have any lab test that is abnormal or we need to change your treatment, we will call you to review the results.  Testing/Procedures: Your physician has requested that you have an echocardiogram as soon as possible. Echocardiography is a painless test that uses sound waves to create images of your heart. It provides your doctor with information about the size and shape of your heart and how well your heart's chambers and valves are working. This procedure takes approximately one hour. There are no restrictions for this procedure.  Follow-Up: At [facility], you and your health needs are our priority.  As part of our continuing mission to provide you with exceptional heart care, we have created designated Provider Care Teams.  These Care Teams include your primary Cardiologist (physician) and Advanced Practice Providers (APPs -  Physician Assistants and Nurse Practitioners) who all work together to provide you with the care you need, when you need it.  We recommend signing up for the patient portal called "MyChart".  Sign up information is provided on this After Visit Summary.  MyChart is used to connect with patients for Virtual Visits (Telemedicine).  Patients are able to view lab/test results, encounter notes, upcoming appointments, etc.  Non-urgent messages can be sent to your provider as well.   To learn more about what you can do with MyChart, go to [email or web address].    Your next appointment:   1 month(s)  The format for your next appointment:   In Person  Provider:   You may see Sinclair Grooms, MD.

## 2019-09-06 ENCOUNTER — Ambulatory Visit (INDEPENDENT_AMBULATORY_CARE_PROVIDER_SITE_OTHER): Payer: Medicare Other | Admitting: *Deleted

## 2019-09-06 DIAGNOSIS — R002 Palpitations: Secondary | ICD-10-CM | POA: Diagnosis not present

## 2019-09-06 LAB — CUP PACEART REMOTE DEVICE CHECK
Date Time Interrogation Session: 20210526014725
Implantable Pulse Generator Implant Date: 20190501

## 2019-09-07 DIAGNOSIS — K219 Gastro-esophageal reflux disease without esophagitis: Secondary | ICD-10-CM | POA: Diagnosis not present

## 2019-09-07 DIAGNOSIS — I350 Nonrheumatic aortic (valve) stenosis: Secondary | ICD-10-CM | POA: Diagnosis not present

## 2019-09-07 DIAGNOSIS — R7301 Impaired fasting glucose: Secondary | ICD-10-CM | POA: Diagnosis not present

## 2019-09-07 DIAGNOSIS — D649 Anemia, unspecified: Secondary | ICD-10-CM | POA: Diagnosis not present

## 2019-09-07 DIAGNOSIS — I480 Paroxysmal atrial fibrillation: Secondary | ICD-10-CM | POA: Diagnosis not present

## 2019-09-07 DIAGNOSIS — I10 Essential (primary) hypertension: Secondary | ICD-10-CM | POA: Diagnosis not present

## 2019-09-07 DIAGNOSIS — M859 Disorder of bone density and structure, unspecified: Secondary | ICD-10-CM | POA: Diagnosis not present

## 2019-09-07 DIAGNOSIS — E538 Deficiency of other specified B group vitamins: Secondary | ICD-10-CM | POA: Diagnosis not present

## 2019-09-07 DIAGNOSIS — I2510 Atherosclerotic heart disease of native coronary artery without angina pectoris: Secondary | ICD-10-CM | POA: Diagnosis not present

## 2019-09-07 DIAGNOSIS — M353 Polymyalgia rheumatica: Secondary | ICD-10-CM | POA: Diagnosis not present

## 2019-09-07 NOTE — Progress Notes (Signed)
[facility] Summary Report / Loop Recorder 

## 2019-09-21 DIAGNOSIS — Z6830 Body mass index (BMI) 30.0-30.9, adult: Secondary | ICD-10-CM | POA: Diagnosis not present

## 2019-09-21 DIAGNOSIS — R6884 Jaw pain: Secondary | ICD-10-CM | POA: Diagnosis not present

## 2019-09-21 DIAGNOSIS — R519 Headache, unspecified: Secondary | ICD-10-CM | POA: Diagnosis not present

## 2019-09-21 DIAGNOSIS — M8580 Other specified disorders of bone density and structure, unspecified site: Secondary | ICD-10-CM | POA: Diagnosis not present

## 2019-09-21 DIAGNOSIS — Z7952 Long term (current) use of systemic steroids: Secondary | ICD-10-CM | POA: Diagnosis not present

## 2019-09-21 DIAGNOSIS — M353 Polymyalgia rheumatica: Secondary | ICD-10-CM | POA: Diagnosis not present

## 2019-09-21 DIAGNOSIS — E669 Obesity, unspecified: Secondary | ICD-10-CM | POA: Diagnosis not present

## 2019-09-21 DIAGNOSIS — M316 Other giant cell arteritis: Secondary | ICD-10-CM | POA: Diagnosis not present

## 2019-09-29 ENCOUNTER — Other Ambulatory Visit: Payer: Self-pay

## 2019-09-29 ENCOUNTER — Ambulatory Visit (HOSPITAL_COMMUNITY): Payer: Medicare Other | Attending: Cardiology

## 2019-09-29 DIAGNOSIS — I350 Nonrheumatic aortic (valve) stenosis: Secondary | ICD-10-CM | POA: Diagnosis present

## 2019-09-29 DIAGNOSIS — R030 Elevated blood-pressure reading, without diagnosis of hypertension: Secondary | ICD-10-CM | POA: Diagnosis not present

## 2019-10-09 ENCOUNTER — Ambulatory Visit (INDEPENDENT_AMBULATORY_CARE_PROVIDER_SITE_OTHER): Payer: Medicare Other | Admitting: *Deleted

## 2019-10-09 DIAGNOSIS — I480 Paroxysmal atrial fibrillation: Secondary | ICD-10-CM

## 2019-10-09 LAB — CUP PACEART REMOTE DEVICE CHECK
Date Time Interrogation Session: 20210628025403
Implantable Pulse Generator Implant Date: 20190501

## 2019-10-11 NOTE — Progress Notes (Signed)
[facility] Summary Report / Loop Recorder 

## 2019-10-18 NOTE — Progress Notes (Signed)
Cardiology Office Note:    Date:  10/20/2019   ID:  Marissa Powers, DOB [birth year], MRN [id number]  PCP:  Crist Infante, MD  Cardiologist:  Sinclair Grooms, MD   Referring MD: Crist Infante, MD   Chief Complaint  Patient presents with  . Coronary Artery Disease  . Cardiac Valve Problem    History of Present Illness:    Marissa Powers is a 79 y.o. female with a hx of hypertension, hyperlipidemia, atypical CP, syncope, PAF identified on Loop reorder, and moderate AS.  Marissa Powers has had asymptomatic 6 months.  During this timeframe she has developed weakness, fatigue, and palpitations.  She was ultimately diagnosed with polymyalgia rheumatica with elevated sed rate and started on prednisone.  She was to have a temporal artery biopsy which is not yet been done.  Still was on the wean.  Over the past 2 to 3 weeks she states she has been doing much better.  She is back with water aerobics.  Palpitations have improved.  She still does not have the energy as she feels she should have.  She sleeps okay.  She denies snoring.  She has not had syncope, denies angina, denies orthopnea, no peripheral edema, and with exertion she gets tired but is not short of breath.  Recent echo demonstrates stability compared with 1 year ago but raises some concern about the possibility of low flow low gradient severe aortic stenosis.  I discussed with her that I was prepared to do more invasive evaluation including heart cath and possible referral to the valve clinic.  In looking at her EKG, there is low voltage also raising the question of whether there could be infiltration from ATTR amyloidosis.   Past Medical History:  Diagnosis Date  . Aortic stenosis, moderate   . Arthritis    osteoarthritis  . Atrial fibrillation ([place])   . Dysplasia of cervix, low grade (CIN 1) 1990   HPV, Cryo, Laser  . 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 Fibroid 2002   1 cm, 2 cm  . GERD (gastroesophageal reflux  disease)   . Hypercholesteremia   . Hypertension   . Hyperthyroidism     Past Surgical History:  Procedure Laterality Date  . BREAST BIOPSY Bilateral 1970's   x2 one a side  . CATARACT EXTRACTION     bilateral  . colon polyp removal    . LOOP RECORDER INSERTION N/A 08/11/2017   Procedure: LOOP RECORDER INSERTION;  Surgeon: Thompson Grayer, MD;  Location: [facility];  Service: Cardiovascular;  Laterality: N/A;  . RETINAL DETACHMENT SURGERY  2000  . THYROIDECTOMY    . TONSILLECTOMY    . TOTAL KNEE ARTHROPLASTY  04/25/2012   Procedure: TOTAL KNEE BILATERAL;  Surgeon: Gearlean Alf, MD;  Location: [facility];  Service: Orthopedics;  Laterality: Bilateral;    Current Medications: Current Meds  Medication Sig  . amoxicillin (AMOXIL) 500 MG capsule TAKE 4 TABLETS 30 60 MINUTES PRIOR TO DENTAL WORK  . apixaban (ELIQUIS) 5 MG TABS tablet Take 1 tablet (5 mg total) by mouth 2 (two) times daily.  [name] CALCIUM PO Take 1 tablet by mouth daily.   . Cholecalciferol (VITAMIN D) 50 MCG (2000 UT) tablet Take 2,000 Units by mouth daily.  . Cyanocobalamin (B-12 PO) Take 1 tablet by mouth daily.  [name] esomeprazole (NEXIUM) 20 MG capsule 40 mg in the morning and at bedtime.   . ibandronate (BONIVA) 150  MG tablet Take 150 mg by mouth every 30 (thirty) days. Take in the morning with a full glass of water, on an empty stomach, and do not take anything else by mouth or lie down for the next 30 min.  [name] levothyroxine (SYNTHROID, LEVOTHROID) 88 MCG tablet Take 88 mcg by mouth daily before breakfast.  . metoprolol succinate (TOPROL-XL) 25 MG 24 hr tablet Take 25 mg by mouth daily.  . metoprolol tartrate (LOPRESSOR) 25 MG tablet Take 1 tablet (25 mg total) by mouth every 6 (six) hours as needed (heart racing).  . nitroGLYCERIN (NITROSTAT) 0.4 MG SL tablet Place 0.4 mg under the tongue every 5 (five) minutes as needed for chest pain.  . predniSONE (DELTASONE) 10 MG tablet Take 10 mg by mouth daily with breakfast.  .  REPATHA SURECLICK 384 MG/ML SOAJ [id number]:212 Milligram(s) SUB-Q Every 2 Weeks  . zolpidem (AMBIEN) 5 MG tablet Take 2.5 mg by mouth at bedtime as needed for sleep.      Allergies:   Cashew nut oil   Social History   Socioeconomic History  . Marital status: Widowed    Spouse name: Not on file  . Number of children: Not on file  . Years of education: Not on file  . Highest education level: Not on file  Occupational History  . Not on file  Tobacco Use  . Smoking status: Never Smoker  . Smokeless tobacco: Never Used  Substance and Sexual Activity  . Alcohol use: Yes    Alcohol/week: 4.0 - 6.0 standard drinks    Types: 4 - 6 Standard drinks or equivalent per week  . Drug use: No  . Sexual activity: Not Currently    Partners: Male    Birth control/protection: Post-menopausal  Other Topics Concern  . Not on file  Social History Narrative  . Not on file   Social Determinants of Health   Financial Resource Strain:   . Difficulty of Paying Living Expenses:   Food Insecurity:   . Worried About Charity fundraiser in the Last Year:   . Arboriculturist in the Last Year:   Transportation Needs:   . Film/video editor (Medical):   [name] Lack of Transportation (Non-Medical):   Physical Activity:   . Days of Exercise per Week:   . Minutes of Exercise per Session:   Stress:   . Feeling of Stress :   Social Connections:   . Frequency of Communication with Friends and Family:   . Frequency of Social Gatherings with Friends and Family:   . Attends Religious Services:   . Active Member of Clubs or Organizations:   . Attends Archivist Meetings:   [name] Marital Status:      Family History: The patient's family history includes Aortic aneurysm in her mother; Breast cancer in her maternal grandmother; Breast cancer (age of onset: 68) in her maternal aunt; Clotting disorder in her mother; Healthy in her father; Heart attack in her maternal grandmother; Heart disease in her  brother.  ROS:   Please see the history of present illness.    Palpitations, polymyalgia rheumatica is new diagnosis.  Fatigue is been an issue.  She has not had bleeding on Eliquis.  She feels she understands when A. fib is present.  The more recent complaints of palpitations were not atrial fibrillation.  An EKG was done when she was visiting her brother in [place].  She was sent to the emergency room because a clinic EKG  raise the question of prior myocardial infarction.  In the setting of relatively diffuse complaints she was referred to the ER but not admitted.  All other systems reviewed and are negative.  EKGs/Labs/Other Studies Reviewed:    The following studies were reviewed today:  ECHOCARDIOGRAM 2021:  IMPRESSIONS  1. Left ventricular ejection fraction, by estimation, is 65 to 70%. The  left ventricle has normal function. The left ventricle has no regional  wall motion abnormalities. There is moderate concentric left ventricular  hypertrophy. Left ventricular  diastolic parameters are consistent with Grade I diastolic dysfunction  (impaired relaxation). Elevated left atrial pressure. The average left  ventricular global longitudinal strain is -16.3 %.  2. Right ventricular systolic function is normal. The right ventricular  size is normal.  3. The mitral valve is normal in structure. Mild mitral valve  regurgitation. No evidence of mitral stenosis.  4. The aortic valve is normal in structure. Aortic valve regurgitation is  not visualized. Moderate aortic valve stenosis. Aortic valve mean gradient  measures 25.0 mmHg. LVSVI =35 5. The inferior vena cava is normal in size with greater than 50%  respiratory variability, suggesting right atrial pressure of 3 mmHg.   CORONARY CT 2019:  Aorta:  Normal size.  Mild diffuse calcifications.  No dissection.  Aortic Valve:  Trileaflet.  No calcifications.  Coronary Arteries:  Normal coronary origin.  Right  dominance.  RCA is a large dominant artery that gives rise to PDA and PLA. There is minimal plaque.  Left main is a large artery that gives rise to LAD and LCX arteries. There is no plaque.  LAD is a large vessel that has moderate calcified plaque in the proximal LAD with associated stenosis 50-69%.  LCX is a non-dominant artery that gives rise to one large OM1 branch. There is minimal plaque.  Other findings:  Normal pulmonary vein drainage into the left atrium.  Normal let atrial appendage without a thrombus.  Normal size of the pulmonary artery.  IMPRESSION: 1. Coronary calcium score of 90. This was 90 percentile for age and sex matched control.  2. Normal coronary origin with right dominance.  3. Moderate stenosis in the proximal LAD. Additional analysis with CT FFR will be submitted.  FFR 2019: 1. Left Main:  No significant stenosis.  2. LAD: Proximal: 0.95, distal: 0.81. 3. LCX: No significant stenosis. 4. RCA: No significant stenosis.  IMPRESSION: 1. CT FFR analysis showed borderline lesion in the proximal LAD, given that it is proximal LAD a cardiac catheterization should be considered.  EKG:  EKG PACs, normal sinus rhythm, low voltage.  Recent Labs: 10/28/2018: ALT 12; Hemoglobin WILL FOLLOW; Platelets WILL FOLLOW  Recent Lipid Panel    Component Value Date/Time   CHOL 122 10/28/2018 0832   TRIG 108 10/28/2018 0832   HDL 55 10/28/2018 0832   CHOLHDL 2.2 10/28/2018 0832   LDLCALC 45 10/28/2018 0832    Physical Exam:    VS:  BP 130/90   Pulse 75   Ht 5' 5.5" (1.664 m)   Wt 179 lb 12.8 oz (81.6 kg)   SpO2 97%   BMI 29.47 kg/m     Wt Readings from Last 3 Encounters:  10/20/19 179 lb 12.8 oz (81.6 kg)  09/05/19 181 lb (82.1 kg)  08/28/19 177 lb (80.3 kg)     GEN: Overweight. No acute distress HEENT: Normal NECK: No JVD. LYMPHATICS: No lymphadenopathy CARDIAC: Slight irregularity.  2-3/6 right upper sternal border and left  mid sternal crescendo  decrescendo systolic murmur compatible with aortic stenosis.  There was RRR without murmur, gallop, or edema. VASCULAR:  Normal Pulses. No bruits. RESPIRATORY:  Clear to auscultation without rales, wheezing or rhonchi  ABDOMEN: Soft, non-tender, non-distended, No pulsatile mass, MUSCULOSKELETAL: No deformity  SKIN: Warm and dry NEUROLOGIC:  Alert and oriented x 3 PSYCHIATRIC:  Normal affect   ASSESSMENT:    1. Aortic valve stenosis, etiology of cardiac valve disease unspecified   2. Coronary artery disease of native artery of native heart with stable angina pectoris (HCC)   3. Paroxysmal atrial fibrillation ([place])   4. SOB (shortness of breath)   5. Polymyalgia rheumatica ([place])   6. Hypercholesteremia   7. Essential hypertension   8. Secondary hypercoagulable state ([place])   9. Educated about COVID-19 virus infection    PLAN:    In order of problems listed above:  1. Moderate aortic stenosis stable from a gradient standpoint compared to a year ago.  Her LV systolic volume index is 35 and with a velocity of 3.6, she could have paradoxical low-flow low gradient severe aortic stenosis.  I was prepared today to consider left and right heart cath and referral to the valve clinic.  However, her dramatic improvement with increased tolerance, return to aerobic exercise, and resolution of palpitations which seem to mitigate against severe aortic stenosis which would continue to cause progressive symptoms.  Plan today is to monitor more closely.  Return in 4 to 6 months.  Also with low voltage on EKG but moderate LV thickness on echo, the question of ATTR is a consideration as it can occur in up to 20% of people with aortic stenosis.  We may at some point need to do a technetium pyrophosphate amyloid scan to exclude this possibility. 2. She is not having symptoms consistent coronary artery disease progression.  She is on Repatha to significantly lower LDL.  Primary prevention  discussed. 3. Linq has identified atrial fibrillation.  She is on Eliquis and having no complications. 4. Over the last 2 to 4 weeks, fatigue and dyspnea have improved. 5. Currently being managed by Dr. Haynes Kerns.  On decreasing dose prednisone. 6. Continue Repatha. 7. No discussion other than continue Eliquis. 8. She has been vaccinated.  Overall education and awareness concerning primary/secondary risk prevention was discussed in detail: LDL less than 70, hemoglobin A1c less than 7, blood pressure target less than 130/80 mmHg, >150 minutes of moderate aerobic activity per week, avoidance of smoking, weight control (via diet and exercise), and continued surveillance/management of/for obstructive sleep apnea.  64-month follow-up Low voltage with increased LV thickness on echo raises question of amyloid.  This finding however is insensitive for the diagnosis   Medication Adjustments/Labs and Tests Ordered: Current medicines are reviewed at length with the patient today.  Concerns regarding medicines are outlined above.  Orders Placed This Encounter  Procedures  . EKG 12-Lead   No orders of the defined types were placed in this encounter.   Patient Instructions  Medication Instructions:  Your physician recommends that you continue on your current medications as directed. Please refer to the Current Medication list given to you today.  *If you need a refill on your cardiac medications before your next appointment, please call your pharmacy*   Lab Work: None If you have labs (blood work) drawn today and your tests are completely normal, you will receive your results only by: [name] MyChart Message (if you have MyChart) OR . A paper copy in the mail If you  have any lab test that is abnormal or we need to change your treatment, we will call you to review the results.   Testing/Procedures: None   Follow-Up: At [facility], you and your health needs are our priority.  As part of our  continuing mission to provide you with exceptional heart care, we have created designated Provider Care Teams.  These Care Teams include your primary Cardiologist (physician) and Advanced Practice Providers (APPs -  Physician Assistants and Nurse Practitioners) who all work together to provide you with the care you need, when you need it.  We recommend signing up for the patient portal called "MyChart".  Sign up information is provided on this After Visit Summary.  MyChart is used to connect with patients for Virtual Visits (Telemedicine).  Patients are able to view lab/test results, encounter notes, upcoming appointments, etc.  Non-urgent messages can be sent to your provider as well.   To learn more about what you can do with MyChart, go to [email or web address].    Your next appointment:   6 month(s)  The format for your next appointment:   In Person  Provider:   You may see Sinclair Grooms, MD or one of the following Advanced Practice Providers on your designated Care Team:    Truitt Merle, NP  Cecilie Kicks, NP  Kathyrn Drown, NP    Other Instructions      Signed, Sinclair Grooms, MD  10/20/2019 10:54 AM    [place]

## 2019-10-20 ENCOUNTER — Encounter: Payer: Self-pay | Admitting: Interventional Cardiology

## 2019-10-20 ENCOUNTER — Ambulatory Visit (INDEPENDENT_AMBULATORY_CARE_PROVIDER_SITE_OTHER): Payer: Medicare Other | Admitting: Interventional Cardiology

## 2019-10-20 ENCOUNTER — Other Ambulatory Visit: Payer: Self-pay

## 2019-10-20 VITALS — BP 130/90 | HR 75 | Ht 65.5 in | Wt 179.8 lb

## 2019-10-20 DIAGNOSIS — E7800 Pure hypercholesterolemia, unspecified: Secondary | ICD-10-CM

## 2019-10-20 DIAGNOSIS — R0602 Shortness of breath: Secondary | ICD-10-CM | POA: Diagnosis not present

## 2019-10-20 DIAGNOSIS — I25118 Atherosclerotic heart disease of native coronary artery with other forms of angina pectoris: Secondary | ICD-10-CM

## 2019-10-20 DIAGNOSIS — I480 Paroxysmal atrial fibrillation: Secondary | ICD-10-CM

## 2019-10-20 DIAGNOSIS — M353 Polymyalgia rheumatica: Secondary | ICD-10-CM

## 2019-10-20 DIAGNOSIS — I10 Essential (primary) hypertension: Secondary | ICD-10-CM

## 2019-10-20 DIAGNOSIS — I350 Nonrheumatic aortic (valve) stenosis: Secondary | ICD-10-CM

## 2019-10-20 DIAGNOSIS — D6869 Other thrombophilia: Secondary | ICD-10-CM

## 2019-10-20 DIAGNOSIS — Z7189 Other specified counseling: Secondary | ICD-10-CM

## 2019-10-20 NOTE — Patient Instructions (Signed)

## 2019-11-06 DIAGNOSIS — E669 Obesity, unspecified: Secondary | ICD-10-CM | POA: Diagnosis not present

## 2019-11-06 DIAGNOSIS — R519 Headache, unspecified: Secondary | ICD-10-CM | POA: Diagnosis not present

## 2019-11-06 DIAGNOSIS — M316 Other giant cell arteritis: Secondary | ICD-10-CM | POA: Diagnosis not present

## 2019-11-06 DIAGNOSIS — M8580 Other specified disorders of bone density and structure, unspecified site: Secondary | ICD-10-CM | POA: Diagnosis not present

## 2019-11-06 DIAGNOSIS — R6884 Jaw pain: Secondary | ICD-10-CM | POA: Diagnosis not present

## 2019-11-06 DIAGNOSIS — Z7952 Long term (current) use of systemic steroids: Secondary | ICD-10-CM | POA: Diagnosis not present

## 2019-11-06 DIAGNOSIS — Z6830 Body mass index (BMI) 30.0-30.9, adult: Secondary | ICD-10-CM | POA: Diagnosis not present

## 2019-11-06 DIAGNOSIS — M353 Polymyalgia rheumatica: Secondary | ICD-10-CM | POA: Diagnosis not present

## 2019-11-09 ENCOUNTER — Encounter: Payer: Self-pay | Admitting: Certified Nurse Midwife

## 2019-11-13 ENCOUNTER — Ambulatory Visit (INDEPENDENT_AMBULATORY_CARE_PROVIDER_SITE_OTHER): Payer: Medicare Other | Admitting: *Deleted

## 2019-11-13 ENCOUNTER — Telehealth: Payer: Self-pay

## 2019-11-13 DIAGNOSIS — I480 Paroxysmal atrial fibrillation: Secondary | ICD-10-CM | POA: Diagnosis not present

## 2019-11-13 LAB — CUP PACEART REMOTE DEVICE CHECK
Date Time Interrogation Session: 20210802000500
Implantable Pulse Generator Implant Date: 20190501

## 2019-11-13 NOTE — Telephone Encounter (Signed)
Received [facility] alert 11/13/19 for AF w/ RVR 140-180's for 11 hours that occurred on 11/08/2019 at 23:07 PM. Known hx of PAF. Currently on Eliquis.  Called patient to assess. No answer. LMOVM.

## 2019-11-14 NOTE — Telephone Encounter (Signed)
Patient returned phone call. States she was out of town and left her medications and home remote box. Patient reports she knew she was in AF (felt fatigue), but did not have her Lopressor with her to take. Patient encouraged to take medications with her and to label the lopressor 25 mg prn every 6 hours. Patient advised if she has any further questions to please call DC or AF. Direct phone number provided.   Routing to AF Clinic to update.

## 2019-11-15 LAB — CUP PACEART REMOTE DEVICE CHECK
Date Time Interrogation Session: 20210731030454
Implantable Pulse Generator Implant Date: 20190501

## 2019-11-16 NOTE — Progress Notes (Signed)
[facility] Summary Report / Loop Recorder 

## 2019-11-19 DIAGNOSIS — Z79890 Hormone replacement therapy (postmenopausal): Secondary | ICD-10-CM | POA: Diagnosis not present

## 2019-11-19 DIAGNOSIS — I4891 Unspecified atrial fibrillation: Secondary | ICD-10-CM | POA: Diagnosis not present

## 2019-11-19 DIAGNOSIS — Z87892 Personal history of anaphylaxis: Secondary | ICD-10-CM | POA: Diagnosis not present

## 2019-11-19 DIAGNOSIS — Z79899 Other long term (current) drug therapy: Secondary | ICD-10-CM | POA: Diagnosis not present

## 2019-11-19 DIAGNOSIS — Z91018 Allergy to other foods: Secondary | ICD-10-CM | POA: Diagnosis not present

## 2019-11-19 DIAGNOSIS — Z7901 Long term (current) use of anticoagulants: Secondary | ICD-10-CM | POA: Diagnosis not present

## 2019-11-19 DIAGNOSIS — Z7952 Long term (current) use of systemic steroids: Secondary | ICD-10-CM | POA: Diagnosis not present

## 2019-11-19 DIAGNOSIS — I10 Essential (primary) hypertension: Secondary | ICD-10-CM | POA: Diagnosis not present

## 2019-11-19 DIAGNOSIS — N390 Urinary tract infection, site not specified: Secondary | ICD-10-CM | POA: Diagnosis not present

## 2019-11-19 DIAGNOSIS — B9689 Other specified bacterial agents as the cause of diseases classified elsewhere: Secondary | ICD-10-CM | POA: Diagnosis not present

## 2019-11-19 DIAGNOSIS — Z20822 Contact with and (suspected) exposure to covid-19: Secondary | ICD-10-CM | POA: Diagnosis not present

## 2019-11-19 DIAGNOSIS — E079 Disorder of thyroid, unspecified: Secondary | ICD-10-CM | POA: Diagnosis not present

## 2019-11-22 DIAGNOSIS — E785 Hyperlipidemia, unspecified: Secondary | ICD-10-CM | POA: Diagnosis not present

## 2019-11-22 DIAGNOSIS — I2510 Atherosclerotic heart disease of native coronary artery without angina pectoris: Secondary | ICD-10-CM | POA: Diagnosis not present

## 2019-11-22 DIAGNOSIS — D649 Anemia, unspecified: Secondary | ICD-10-CM | POA: Diagnosis not present

## 2019-11-22 DIAGNOSIS — I480 Paroxysmal atrial fibrillation: Secondary | ICD-10-CM | POA: Diagnosis not present

## 2019-11-22 DIAGNOSIS — R0789 Other chest pain: Secondary | ICD-10-CM | POA: Diagnosis not present

## 2019-11-22 DIAGNOSIS — R1032 Left lower quadrant pain: Secondary | ICD-10-CM | POA: Diagnosis not present

## 2019-11-22 DIAGNOSIS — E538 Deficiency of other specified B group vitamins: Secondary | ICD-10-CM | POA: Diagnosis not present

## 2019-11-22 DIAGNOSIS — E039 Hypothyroidism, unspecified: Secondary | ICD-10-CM | POA: Diagnosis not present

## 2019-11-22 DIAGNOSIS — M8580 Other specified disorders of bone density and structure, unspecified site: Secondary | ICD-10-CM | POA: Diagnosis not present

## 2019-11-22 DIAGNOSIS — I350 Nonrheumatic aortic (valve) stenosis: Secondary | ICD-10-CM | POA: Diagnosis not present

## 2019-11-22 DIAGNOSIS — I10 Essential (primary) hypertension: Secondary | ICD-10-CM | POA: Diagnosis not present

## 2019-11-22 DIAGNOSIS — R7301 Impaired fasting glucose: Secondary | ICD-10-CM | POA: Diagnosis not present

## 2019-11-23 ENCOUNTER — Other Ambulatory Visit: Payer: Self-pay | Admitting: Internal Medicine

## 2019-11-23 DIAGNOSIS — R1032 Left lower quadrant pain: Secondary | ICD-10-CM

## 2019-11-29 ENCOUNTER — Other Ambulatory Visit: Payer: Self-pay

## 2019-11-29 ENCOUNTER — Encounter (HOSPITAL_COMMUNITY): Payer: Self-pay | Admitting: Physician Assistant

## 2019-11-29 ENCOUNTER — Ambulatory Visit (HOSPITAL_COMMUNITY)
Admission: RE | Admit: 2019-11-29 | Discharge: 2019-11-29 | Disposition: A | Discharge status: Home / Self Care | Payer: Medicare Other | Source: Ambulatory Visit | Attending: Physician Assistant | Admitting: Physician Assistant

## 2019-11-29 VITALS — BP 162/88 | HR 59 | Ht 65.5 in | Wt 183.0 lb

## 2019-11-29 DIAGNOSIS — Z7901 Long term (current) use of anticoagulants: Secondary | ICD-10-CM | POA: Insufficient documentation

## 2019-11-29 DIAGNOSIS — R002 Palpitations: Secondary | ICD-10-CM | POA: Insufficient documentation

## 2019-11-29 DIAGNOSIS — N879 Dysplasia of cervix uteri, unspecified: Secondary | ICD-10-CM | POA: Diagnosis not present

## 2019-11-29 DIAGNOSIS — E039 Hypothyroidism, unspecified: Secondary | ICD-10-CM | POA: Insufficient documentation

## 2019-11-29 DIAGNOSIS — Z79890 Hormone replacement therapy (postmenopausal): Secondary | ICD-10-CM | POA: Insufficient documentation

## 2019-11-29 DIAGNOSIS — I2510 Atherosclerotic heart disease of native coronary artery without angina pectoris: Secondary | ICD-10-CM | POA: Diagnosis not present

## 2019-11-29 DIAGNOSIS — E7800 Pure hypercholesterolemia, unspecified: Secondary | ICD-10-CM | POA: Diagnosis not present

## 2019-11-29 DIAGNOSIS — I350 Nonrheumatic aortic (valve) stenosis: Secondary | ICD-10-CM | POA: Diagnosis not present

## 2019-11-29 DIAGNOSIS — D6869 Other thrombophilia: Secondary | ICD-10-CM | POA: Insufficient documentation

## 2019-11-29 DIAGNOSIS — M1990 Unspecified osteoarthritis, unspecified site: Secondary | ICD-10-CM | POA: Diagnosis not present

## 2019-11-29 DIAGNOSIS — K219 Gastro-esophageal reflux disease without esophagitis: Secondary | ICD-10-CM | POA: Diagnosis not present

## 2019-11-29 DIAGNOSIS — Z79899 Other long term (current) drug therapy: Secondary | ICD-10-CM | POA: Diagnosis not present

## 2019-11-29 DIAGNOSIS — I480 Paroxysmal atrial fibrillation: Secondary | ICD-10-CM | POA: Insufficient documentation

## 2019-11-29 DIAGNOSIS — I10 Essential (primary) hypertension: Secondary | ICD-10-CM | POA: Insufficient documentation

## 2019-11-29 DIAGNOSIS — Z7952 Long term (current) use of systemic steroids: Secondary | ICD-10-CM | POA: Diagnosis not present

## 2019-11-29 NOTE — Progress Notes (Signed)
Primary Care Physician: Crist Infante, MD Referring Physician: Dr. Joylene Draft Primary Cardiologist: Dr Tamala Julian Primary EP: Dr Rayann Heman   Marissa Powers is a 79 y.o. female with a h/o palpitations, htn, hypothyroidism, CAD.She saw Dr. Joylene Draft, 4/29, for her annual physical and mentioned rapid heart beat in the middle of the night, which sounded suspicious  for afib and referred here for further evaluation. The pt brings in recorded  V/S, where she had an episode for around 4 hours with HR's in the 100-120 bpm range in March. She went around 4-6  weeks before she had similar symptoms. She states that she may snore, but she is a widow so no one in the house to report apnea. She reports that she sleeps well and does not have daytime somnolence. She does drink alcohol several times a week, 1-2 drinks at a time. She has been on metoprolol for years for palpitations and will usually take an extra 1/2 tab of metoprolol when she has an episode. She takes a baby ASA a day. Chadsvasc score is at least 4. No tobacco use, minimal caffeine use. Her son is a Secondary school teacher in [place].  On follow up today, patient did have one episode of afib on 11/08/19 which lasted about 11 hours. She did not have her PRN BB with her at the time. There were no specific triggers that she could identify. Patient does reports that her symptoms of weakness with activity and feeling "tired in her chest" have returned. She was previously evaluated by Dr Tamala Julian and [facility] were considered but her symptoms improved so this was deferred. She denies CP or syncope.   Today, she denies symptoms of palpitations, chest pain, shortness of breath, orthopnea, PND, lower extremity edema, dizziness, presyncope, syncope, or neurologic sequela. The patient is tolerating medications without difficulties and is otherwise without complaint today.   Past Medical History:  Diagnosis Date  . Aortic stenosis, moderate   . Arthritis    osteoarthritis  . Atrial  fibrillation ([place])   . Dysplasia of cervix, low grade (CIN 1) 1990   HPV, Cryo, Laser  . 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 Fibroid 2002   1 cm, 2 cm  . GERD (gastroesophageal reflux disease)   . Hypercholesteremia   . Hypertension   . Hyperthyroidism    Past Surgical History:  Procedure Laterality Date  . BREAST BIOPSY Bilateral 1970's   x2 one a side  . CATARACT EXTRACTION     bilateral  . colon polyp removal    . LOOP RECORDER INSERTION N/A 08/11/2017   Procedure: LOOP RECORDER INSERTION;  Surgeon: Thompson Grayer, MD;  Location: [facility];  Service: Cardiovascular;  Laterality: N/A;  . RETINAL DETACHMENT SURGERY  2000  . THYROIDECTOMY    . TONSILLECTOMY    . TOTAL KNEE ARTHROPLASTY  04/25/2012   Procedure: TOTAL KNEE BILATERAL;  Surgeon: Gearlean Alf, MD;  Location: [facility];  Service: Orthopedics;  Laterality: Bilateral;    Current Outpatient Medications  Medication Sig Dispense Refill  . amoxicillin (AMOXIL) 500 MG capsule TAKE 4 TABLETS 30 60 MINUTES PRIOR TO DENTAL WORK    . apixaban (ELIQUIS) 5 MG TABS tablet Take 1 tablet (5 mg total) by mouth 2 (two) times daily. 60 tablet 11  . CALCIUM PO Take 1 tablet by mouth daily.     . Cholecalciferol (VITAMIN D) 50 MCG (2000 UT) tablet Take 2,000 Units by mouth daily.    [name]  Cyanocobalamin (B-12 PO) Take 1 tablet by mouth daily.    [name] esomeprazole (NEXIUM) 20 MG capsule 40 mg in the morning and at bedtime.     . ibandronate (BONIVA) 150 MG tablet Take 150 mg by mouth every 30 (thirty) days. Take in the morning with a full glass of water, on an empty stomach, and do not take anything else by mouth or lie down for the next 30 min.    [name] levothyroxine (SYNTHROID, LEVOTHROID) 88 MCG tablet Take 88 mcg by mouth daily before breakfast.    . metoprolol succinate (TOPROL-XL) 25 MG 24 hr tablet Take 25 mg by mouth daily.    . metoprolol tartrate (LOPRESSOR) 25 MG tablet Take 1 tablet (25 mg total) by mouth every 6  (six) hours as needed (heart racing). 45 tablet 1  . nitroGLYCERIN (NITROSTAT) 0.4 MG SL tablet Place 0.4 mg under the tongue every 5 (five) minutes as needed for chest pain.    . predniSONE (DELTASONE) 10 MG tablet Take 9 mg by mouth daily with breakfast.     . REPATHA SURECLICK 694 MG/ML SOAJ WNIOEVOJ:500 Milligram(s) SUB-Q Every 2 Weeks    . telmisartan (MICARDIS) 20 MG tablet SMARTSIG:1 Tablet(s) By Mouth Every Evening    . zolpidem (AMBIEN) 5 MG tablet Take 2.5 mg by mouth at bedtime as needed for sleep.      No current facility-administered medications for this encounter.    Allergies  Allergen Reactions  . Cashew Nut Oil Other (See Comments) and Anaphylaxis    Tingley, swelling Tingley, swelling    Social History   Socioeconomic History  . Marital status: Widowed    Spouse name: Not on file  . Number of children: Not on file  . Years of education: Not on file  . Highest education level: Not on file  Occupational History  . Not on file  Tobacco Use  . Smoking status: Never Smoker  . Smokeless tobacco: Never Used  Substance and Sexual Activity  . Alcohol use: Yes    Alcohol/week: 4.0 - 6.0 standard drinks    Types: 4 - 6 Standard drinks or equivalent per week  . Drug use: No  . Sexual activity: Not Currently    Partners: Male    Birth control/protection: Post-menopausal  Other Topics Concern  . Not on file  Social History Narrative  . Not on file   Social Determinants of Health   Financial Resource Strain:   . Difficulty of Paying Living Expenses:   Food Insecurity:   . Worried About Charity fundraiser in the Last Year:   . Arboriculturist in the Last Year:   Transportation Needs:   . Film/video editor (Medical):   [name] Lack of Transportation (Non-Medical):   Physical Activity:   . Days of Exercise per Week:   . Minutes of Exercise per Session:   Stress:   . Feeling of Stress :   Social Connections:   . Frequency of Communication with Friends and  Family:   . Frequency of Social Gatherings with Friends and Family:   . Attends Religious Services:   . Active Member of Clubs or Organizations:   . Attends Archivist Meetings:   [name] Marital Status:   Intimate Partner Violence:   . Fear of Current or Ex-Partner:   . Emotionally Abused:   [name] Physically Abused:   . Sexually Abused:     Family History  Problem Relation Age of Onset  .  Breast cancer Maternal Grandmother   . Heart attack Maternal Grandmother   . Aortic aneurysm Mother   . Clotting disorder Mother   . Breast cancer Maternal Aunt 49  . Heart disease Brother        stints  . Healthy Father     ROS- All systems are reviewed and negative except as per the HPI above  Physical Exam: Vitals:   11/29/19 1031 11/29/19 1116  BP: (!) 170/92 (!) 162/88  Pulse: (!) 59   Weight: 83 kg   Height: 5' 5.5" (1.664 m)    Wt Readings from Last 3 Encounters:  11/29/19 83 kg  10/20/19 81.6 kg  09/05/19 82.1 kg    Labs: Lab Results  Component Value Date   NA 142 10/11/2017   K 4.4 10/11/2017   CL 104 10/11/2017   CO2 21 10/11/2017   GLUCOSE 91 10/11/2017   BUN 23 10/11/2017   CREATININE 0.70 11/30/2017   CALCIUM 9.7 10/11/2017   Lab Results  Component Value Date   INR 1.77 (H) 04/29/2012   Lab Results  Component Value Date   CHOL 122 10/28/2018   HDL 55 10/28/2018   LDLCALC 45 10/28/2018   TRIG 108 10/28/2018    GEN- The patient is well appearing elderly female, alert and oriented x 3 today.   HEENT-head normocephalic, atraumatic, sclera clear, conjunctiva pink, hearing intact, trachea midline. Lungs- Clear to ausculation bilaterally, normal work of breathing Heart- Regular rate and rhythm, no murmurs, rubs or gallops  GI- soft, NT, ND, + BS Extremities- no clubbing, cyanosis, or edema MS- no significant deformity or atrophy Skin- no rash or lesion Psych- euthymic mood, full affect Neuro- strength and sensation are intact   EKG- SB HR 59, slow R  wave prog, PR 170, QRS 80, QTc 423  Dr. Silvestre Mesi notes reviewed   Assessment and Plan: 1. Paroxysmal atrial fibrillation  ILR shows 0.6% afib burden. Last episode was 11/08/19. Continue Toprol 25 mg daily Continue Lopressor 25 mg q6 hours PRN for heart racing. Continue Eliquis 5 mg BID  2. Secondary hypercoagulable state This patients CHA2DS2-VASc Score and unadjusted Ischemic Stroke Rate (% per year) is equal to 7.2 % stroke rate/year from a score of 5  3. HTN Elevated today. Recently started on Micardis.  No changes today.  4. Aortic stenosis Moderate by echo ? Related to her symptoms of weakness with activity.  Will have her f/u with Dr Tamala Julian or APP   Follow up with Dr Tamala Julian or APP. Follow up in the AF clinic in 6 months.    [facility] [address] [place], [place] [address] [phone]

## 2019-12-09 DIAGNOSIS — Z23 Encounter for immunization: Secondary | ICD-10-CM | POA: Diagnosis not present

## 2019-12-17 LAB — CUP PACEART REMOTE DEVICE CHECK
Date Time Interrogation Session: 20210902030633
Implantable Pulse Generator Implant Date: 20190501

## 2019-12-19 ENCOUNTER — Ambulatory Visit (INDEPENDENT_AMBULATORY_CARE_PROVIDER_SITE_OTHER): Payer: Medicare Other | Admitting: *Deleted

## 2019-12-19 DIAGNOSIS — R002 Palpitations: Secondary | ICD-10-CM

## 2019-12-20 ENCOUNTER — Ambulatory Visit
Admission: RE | Admit: 2019-12-20 | Discharge: 2019-12-20 | Disposition: A | Discharge status: Home / Self Care | Payer: Medicare Other | Source: Ambulatory Visit | Attending: Internal Medicine | Admitting: Internal Medicine

## 2019-12-20 DIAGNOSIS — R1032 Left lower quadrant pain: Secondary | ICD-10-CM

## 2019-12-20 DIAGNOSIS — K5732 Diverticulitis of large intestine without perforation or abscess without bleeding: Secondary | ICD-10-CM | POA: Diagnosis not present

## 2019-12-20 MED ORDER — IOPAMIDOL (ISOVUE-300) INJECTION 61%
100.0000 mL | Freq: Once | INTRAVENOUS | Status: AC | PRN
  Administered 2019-12-20: 100 mL via INTRAVENOUS

## 2019-12-21 NOTE — Progress Notes (Signed)
[facility] Summary Report / Loop Recorder 

## 2020-01-19 LAB — CUP PACEART REMOTE DEVICE CHECK
Date Time Interrogation Session: 20211005030904
Implantable Pulse Generator Implant Date: 20190501

## 2020-01-22 ENCOUNTER — Ambulatory Visit (INDEPENDENT_AMBULATORY_CARE_PROVIDER_SITE_OTHER): Payer: Medicare Other

## 2020-01-22 DIAGNOSIS — R002 Palpitations: Secondary | ICD-10-CM | POA: Diagnosis not present

## 2020-01-24 NOTE — Progress Notes (Signed)
[facility] Summary Report / Loop Recorder 

## 2020-02-07 DIAGNOSIS — Z23 Encounter for immunization: Secondary | ICD-10-CM | POA: Diagnosis not present

## 2020-02-08 DIAGNOSIS — R6884 Jaw pain: Secondary | ICD-10-CM | POA: Diagnosis not present

## 2020-02-08 DIAGNOSIS — Z6830 Body mass index (BMI) 30.0-30.9, adult: Secondary | ICD-10-CM | POA: Diagnosis not present

## 2020-02-08 DIAGNOSIS — M8580 Other specified disorders of bone density and structure, unspecified site: Secondary | ICD-10-CM | POA: Diagnosis not present

## 2020-02-08 DIAGNOSIS — R519 Headache, unspecified: Secondary | ICD-10-CM | POA: Diagnosis not present

## 2020-02-08 DIAGNOSIS — Z7952 Long term (current) use of systemic steroids: Secondary | ICD-10-CM | POA: Diagnosis not present

## 2020-02-08 DIAGNOSIS — M353 Polymyalgia rheumatica: Secondary | ICD-10-CM | POA: Diagnosis not present

## 2020-02-08 DIAGNOSIS — E669 Obesity, unspecified: Secondary | ICD-10-CM | POA: Diagnosis not present

## 2020-02-08 DIAGNOSIS — M316 Other giant cell arteritis: Secondary | ICD-10-CM | POA: Diagnosis not present

## 2020-02-18 LAB — CUP PACEART REMOTE DEVICE CHECK
Date Time Interrogation Session: 20211107022849
Implantable Pulse Generator Implant Date: 20190501

## 2020-02-26 ENCOUNTER — Ambulatory Visit (INDEPENDENT_AMBULATORY_CARE_PROVIDER_SITE_OTHER): Payer: Medicare Other

## 2020-02-26 DIAGNOSIS — R002 Palpitations: Secondary | ICD-10-CM | POA: Diagnosis not present

## 2020-02-28 NOTE — Progress Notes (Signed)
[facility] Summary Report / Loop Recorder 

## 2020-04-01 ENCOUNTER — Ambulatory Visit (INDEPENDENT_AMBULATORY_CARE_PROVIDER_SITE_OTHER): Payer: Medicare Other

## 2020-04-01 DIAGNOSIS — R002 Palpitations: Secondary | ICD-10-CM

## 2020-04-01 LAB — CUP PACEART REMOTE DEVICE CHECK
Date Time Interrogation Session: 20211218232813
Implantable Pulse Generator Implant Date: 20190501

## 2020-04-13 DIAGNOSIS — K5792 Diverticulitis of intestine, part unspecified, without perforation or abscess without bleeding: Secondary | ICD-10-CM

## 2020-04-13 HISTORY — DX: Diverticulitis of intestine, part unspecified, without perforation or abscess without bleeding: K57.92

## 2020-04-15 NOTE — Progress Notes (Signed)
[facility] Summary Report / Loop Recorder 

## 2020-04-22 DIAGNOSIS — L821 Other seborrheic keratosis: Secondary | ICD-10-CM | POA: Diagnosis not present

## 2020-04-22 DIAGNOSIS — D692 Other nonthrombocytopenic purpura: Secondary | ICD-10-CM | POA: Diagnosis not present

## 2020-04-22 DIAGNOSIS — D2261 Melanocytic nevi of right upper limb, including shoulder: Secondary | ICD-10-CM | POA: Diagnosis not present

## 2020-04-22 DIAGNOSIS — L304 Erythema intertrigo: Secondary | ICD-10-CM | POA: Diagnosis not present

## 2020-04-22 DIAGNOSIS — Z85828 Personal history of other malignant neoplasm of skin: Secondary | ICD-10-CM | POA: Diagnosis not present

## 2020-04-22 DIAGNOSIS — L820 Inflamed seborrheic keratosis: Secondary | ICD-10-CM | POA: Diagnosis not present

## 2020-04-22 DIAGNOSIS — L814 Other melanin hyperpigmentation: Secondary | ICD-10-CM | POA: Diagnosis not present

## 2020-05-05 LAB — CUP PACEART REMOTE DEVICE CHECK
Date Time Interrogation Session: 20220121011353
Implantable Pulse Generator Implant Date: 20190501

## 2020-05-06 ENCOUNTER — Ambulatory Visit (INDEPENDENT_AMBULATORY_CARE_PROVIDER_SITE_OTHER): Payer: Medicare Other

## 2020-05-06 DIAGNOSIS — R002 Palpitations: Secondary | ICD-10-CM

## 2020-05-10 ENCOUNTER — Ambulatory Visit: Payer: Medicare Other | Admitting: Certified Nurse Midwife

## 2020-05-13 ENCOUNTER — Ambulatory Visit: Payer: Medicare Other

## 2020-05-13 DIAGNOSIS — M8580 Other specified disorders of bone density and structure, unspecified site: Secondary | ICD-10-CM | POA: Diagnosis not present

## 2020-05-13 DIAGNOSIS — E663 Overweight: Secondary | ICD-10-CM | POA: Diagnosis not present

## 2020-05-13 DIAGNOSIS — M353 Polymyalgia rheumatica: Secondary | ICD-10-CM | POA: Diagnosis not present

## 2020-05-13 DIAGNOSIS — R6884 Jaw pain: Secondary | ICD-10-CM | POA: Diagnosis not present

## 2020-05-13 DIAGNOSIS — M316 Other giant cell arteritis: Secondary | ICD-10-CM | POA: Diagnosis not present

## 2020-05-13 DIAGNOSIS — Z7952 Long term (current) use of systemic steroids: Secondary | ICD-10-CM | POA: Diagnosis not present

## 2020-05-13 DIAGNOSIS — Z6829 Body mass index (BMI) 29.0-29.9, adult: Secondary | ICD-10-CM | POA: Diagnosis not present

## 2020-05-13 DIAGNOSIS — R519 Headache, unspecified: Secondary | ICD-10-CM | POA: Diagnosis not present

## 2020-05-17 NOTE — Progress Notes (Signed)
[facility] Summary Report / Loop Recorder 

## 2020-05-24 DIAGNOSIS — K219 Gastro-esophageal reflux disease without esophagitis: Secondary | ICD-10-CM | POA: Diagnosis not present

## 2020-05-24 DIAGNOSIS — R1013 Epigastric pain: Secondary | ICD-10-CM | POA: Diagnosis not present

## 2020-05-24 DIAGNOSIS — R198 Other specified symptoms and signs involving the digestive system and abdomen: Secondary | ICD-10-CM | POA: Diagnosis not present

## 2020-05-24 DIAGNOSIS — R152 Fecal urgency: Secondary | ICD-10-CM | POA: Diagnosis not present

## 2020-05-27 ENCOUNTER — Other Ambulatory Visit: Payer: Self-pay | Admitting: Gastroenterology

## 2020-05-27 DIAGNOSIS — R1013 Epigastric pain: Secondary | ICD-10-CM

## 2020-06-08 LAB — CUP PACEART REMOTE DEVICE CHECK
Date Time Interrogation Session: 20220223023341
Implantable Pulse Generator Implant Date: 20190501

## 2020-06-10 ENCOUNTER — Ambulatory Visit (INDEPENDENT_AMBULATORY_CARE_PROVIDER_SITE_OTHER): Payer: Medicare Other

## 2020-06-10 ENCOUNTER — Ambulatory Visit (HOSPITAL_BASED_OUTPATIENT_CLINIC_OR_DEPARTMENT_OTHER): Payer: Medicare Other | Admitting: Obstetrics & Gynecology

## 2020-06-10 DIAGNOSIS — I480 Paroxysmal atrial fibrillation: Secondary | ICD-10-CM | POA: Diagnosis not present

## 2020-06-10 DIAGNOSIS — M316 Other giant cell arteritis: Secondary | ICD-10-CM | POA: Diagnosis not present

## 2020-06-10 DIAGNOSIS — Z7952 Long term (current) use of systemic steroids: Secondary | ICD-10-CM | POA: Diagnosis not present

## 2020-06-11 ENCOUNTER — Ambulatory Visit
Admission: RE | Admit: 2020-06-11 | Discharge: 2020-06-11 | Disposition: A | Discharge status: Home / Self Care | Payer: Medicare Other | Source: Ambulatory Visit | Attending: Gastroenterology | Admitting: Gastroenterology

## 2020-06-11 DIAGNOSIS — K760 Fatty (change of) liver, not elsewhere classified: Secondary | ICD-10-CM | POA: Diagnosis not present

## 2020-06-11 DIAGNOSIS — M859 Disorder of bone density and structure, unspecified: Secondary | ICD-10-CM | POA: Diagnosis not present

## 2020-06-11 DIAGNOSIS — R7301 Impaired fasting glucose: Secondary | ICD-10-CM | POA: Diagnosis not present

## 2020-06-11 DIAGNOSIS — E538 Deficiency of other specified B group vitamins: Secondary | ICD-10-CM | POA: Diagnosis not present

## 2020-06-11 DIAGNOSIS — R1013 Epigastric pain: Secondary | ICD-10-CM

## 2020-06-11 DIAGNOSIS — E039 Hypothyroidism, unspecified: Secondary | ICD-10-CM | POA: Diagnosis not present

## 2020-06-11 DIAGNOSIS — E785 Hyperlipidemia, unspecified: Secondary | ICD-10-CM | POA: Diagnosis not present

## 2020-06-12 DIAGNOSIS — D7589 Other specified diseases of blood and blood-forming organs: Secondary | ICD-10-CM | POA: Diagnosis not present

## 2020-06-12 DIAGNOSIS — R82998 Other abnormal findings in urine: Secondary | ICD-10-CM | POA: Diagnosis not present

## 2020-06-12 DIAGNOSIS — E785 Hyperlipidemia, unspecified: Secondary | ICD-10-CM | POA: Diagnosis not present

## 2020-06-12 DIAGNOSIS — I2510 Atherosclerotic heart disease of native coronary artery without angina pectoris: Secondary | ICD-10-CM | POA: Diagnosis not present

## 2020-06-12 DIAGNOSIS — I350 Nonrheumatic aortic (valve) stenosis: Secondary | ICD-10-CM | POA: Diagnosis not present

## 2020-06-12 DIAGNOSIS — I480 Paroxysmal atrial fibrillation: Secondary | ICD-10-CM | POA: Diagnosis not present

## 2020-06-12 DIAGNOSIS — E538 Deficiency of other specified B group vitamins: Secondary | ICD-10-CM | POA: Diagnosis not present

## 2020-06-12 DIAGNOSIS — M8589 Other specified disorders of bone density and structure, multiple sites: Secondary | ICD-10-CM | POA: Diagnosis not present

## 2020-06-12 DIAGNOSIS — I6529 Occlusion and stenosis of unspecified carotid artery: Secondary | ICD-10-CM | POA: Diagnosis not present

## 2020-06-12 DIAGNOSIS — Z1331 Encounter for screening for depression: Secondary | ICD-10-CM | POA: Diagnosis not present

## 2020-06-12 DIAGNOSIS — Z0000 Encounter for general adult medical examination without abnormal findings: Secondary | ICD-10-CM | POA: Diagnosis not present

## 2020-06-12 DIAGNOSIS — I10 Essential (primary) hypertension: Secondary | ICD-10-CM | POA: Diagnosis not present

## 2020-06-12 DIAGNOSIS — M353 Polymyalgia rheumatica: Secondary | ICD-10-CM | POA: Diagnosis not present

## 2020-06-12 DIAGNOSIS — K921 Melena: Secondary | ICD-10-CM | POA: Diagnosis not present

## 2020-06-16 NOTE — Progress Notes (Signed)
Cardiology Office Note:    Date:  06/17/2020   ID:  Marissa Powers, DOB 10/01/1940, MRN [id number]  PCP:  Crist Infante, MD  Cardiologist:  Sinclair Grooms, MD   Referring MD: Crist Infante, MD   Chief Complaint  Patient presents with  . Atrial Fibrillation  . Coronary Artery Disease  . Cardiac Valve Problem    History of Present Illness:    Marissa Powers is a 80 y.o. female with a hx of  hypertension, hyperlipidemia, atypical CP, syncope, PAF identified on Loop reorder, and moderate AS.  Polymyalgia rheumatica for which he takes methotrexate and prednisone.  She really is doing well.  She is grandmother of 5.  She has polymyalgia rheumatica which causes musculoskeletal discomfort and fatigue.  She denies angina, orthopnea, edema, swelling, and palpitations.  She has not needed to use rescue doses of metoprolol.  She feels much of her atrial fibrillation was precipitated by high dose prednisone.  Now on lower doses, all the palpitations have resolved.  Compliant with her medical regimen including Eliquis.  Educated on looking for evidence of GI bleeding since she is on prednisone and DOAC.   Past Medical History:  Diagnosis Date  . Aortic stenosis, moderate   . Arthritis    osteoarthritis  . Atrial fibrillation ([place])   . Dysplasia of cervix, low grade (CIN 1) 1990   HPV, Cryo, Laser  . 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 Fibroid 2002   1 cm, 2 cm  . GERD (gastroesophageal reflux disease)   . Hypercholesteremia   . Hypertension   . Hyperthyroidism     Past Surgical History:  Procedure Laterality Date  . BREAST BIOPSY Bilateral 1970's   x2 one a side  . CATARACT EXTRACTION     bilateral  . colon polyp removal    . LOOP RECORDER INSERTION N/A 08/11/2017   Procedure: LOOP RECORDER INSERTION;  Surgeon: Thompson Grayer, MD;  Location: [facility];  Service: Cardiovascular;  Laterality: N/A;  . RETINAL DETACHMENT SURGERY  2000  . THYROIDECTOMY     . TONSILLECTOMY    . TOTAL KNEE ARTHROPLASTY  04/25/2012   Procedure: TOTAL KNEE BILATERAL;  Surgeon: Gearlean Alf, MD;  Location: [facility];  Service: Orthopedics;  Laterality: Bilateral;    Current Medications: Current Meds  Medication Sig  . amoxicillin (AMOXIL) 500 MG capsule TAKE 4 TABLETS 30 60 MINUTES PRIOR TO DENTAL WORK  . apixaban (ELIQUIS) 5 MG TABS tablet Take 1 tablet (5 mg total) by mouth 2 (two) times daily.  [name] CALCIUM PO Take 1 tablet by mouth daily.   . Cholecalciferol (VITAMIN D) 50 MCG (2000 UT) tablet Take 2,000 Units by mouth daily.  . Cyanocobalamin (B-12 PO) Take 1 tablet by mouth daily.  [name] esomeprazole (NEXIUM) 20 MG capsule 40 mg in the morning and at bedtime.   . ibandronate (BONIVA) 150 MG tablet Take 150 mg by mouth every 30 (thirty) days. Take in the morning with a full glass of water, on an empty stomach, and do not take anything else by mouth or lie down for the next 30 min.  [name] levothyroxine (SYNTHROID, LEVOTHROID) 88 MCG tablet Take 88 mcg by mouth daily before breakfast.  . methotrexate (RHEUMATREX) 2.5 MG tablet Take 6 tablets by mouth once a week.  . metoprolol succinate (TOPROL-XL) 25 MG 24 hr tablet Take 25 mg by mouth daily.  . metoprolol tartrate (LOPRESSOR) 25 MG  tablet Take 1 tablet (25 mg total) by mouth every 6 (six) hours as needed (heart racing).  . nitroGLYCERIN (NITROSTAT) 0.4 MG SL tablet Place 0.4 mg under the tongue every 5 (five) minutes as needed for chest pain.  . predniSONE (DELTASONE) 10 MG tablet Take 9 mg by mouth daily with breakfast.   . REPATHA SURECLICK 128 MG/ML SOAJ [id number] Milligram(s) SUB-Q Every 2 Weeks  . telmisartan (MICARDIS) 20 MG tablet SMARTSIG:1 Tablet(s) By Mouth Every Evening  . zolpidem (AMBIEN) 5 MG tablet Take 2.5 mg by mouth at bedtime as needed for sleep.      Allergies:   Cashew nut oil   Social History   Socioeconomic History  . Marital status: Widowed    Spouse name: Not on file  . Number of  children: Not on file  . Years of education: Not on file  . Highest education level: Not on file  Occupational History  . Not on file  Tobacco Use  . Smoking status: Never Smoker  . Smokeless tobacco: Never Used  Substance and Sexual Activity  . Alcohol use: Yes    Alcohol/week: 4.0 - 6.0 standard drinks    Types: 4 - 6 Standard drinks or equivalent per week  . Drug use: No  . Sexual activity: Not Currently    Partners: Male    Birth control/protection: Post-menopausal  Other Topics Concern  . Not on file  Social History Narrative  . Not on file   Social Determinants of Health   Financial Resource Strain: Not on file  Food Insecurity: Not on file  Transportation Needs: Not on file  Physical Activity: Not on file  Stress: Not on file  Social Connections: Not on file     Family History: The patient's family history includes Aortic aneurysm in her mother; Breast cancer in her maternal grandmother; Breast cancer (age of onset: 13) in her maternal aunt; Clotting disorder in her mother; Healthy in her father; Heart attack in her maternal grandmother; Heart disease in her brother.  ROS:   Please see the history of present illness.    Walks her dog daily.  Breathing is been stable although she does notice some shortness of breath.  She has not had syncope.  No claudication.  No blood in the urine or stool.  All other systems reviewed and are negative.  EKGs/Labs/Other Studies Reviewed:    The following studies were reviewed today: ECHOCARDIOGRAM 09/2019: IMPRESSIONS    1. Left ventricular ejection fraction, by estimation, is 65 to 70%. The  left ventricle has normal function. The left ventricle has no regional  wall motion abnormalities. There is moderate concentric left ventricular  hypertrophy. Left ventricular  diastolic parameters are consistent with Grade I diastolic dysfunction  (impaired relaxation). Elevated left atrial pressure. The average left  ventricular global  longitudinal strain is -16.3 %.  2. Right ventricular systolic function is normal. The right ventricular  size is normal.  3. The mitral valve is normal in structure. Mild mitral valve  regurgitation. No evidence of mitral stenosis.  4. The aortic valve is normal in structure. Aortic valve regurgitation is  not visualized. Moderate aortic valve stenosis. Aortic valve mean gradient  measures 25.0 mmHg.  5. The inferior vena cava is normal in size with greater than 50%  respiratory variability, suggesting right atrial pressure of 3 mmHg.   COR CTA 2019: IMPRESSION: 1. Coronary calcium score of 90. This was 29 percentile for age and sex matched control.  2. Normal  coronary origin with right dominance.  3. Moderate stenosis in the proximal LAD. Additional analysis with CT FFR will be submitted.   11/2017 FFR: FINDINGS: FFRct analysis was performed on the original cardiac CT angiogram dataset. Diagrammatic representation of the FFRct analysis is provided in a separate PDF document in PACS. This dictation was created using the PDF document and an interactive 3D model of the results. 3D model is not available in the EMR/PACS. Normal FFR range is >0.80.  1. Left Main:  No significant stenosis.  2. LAD: Proximal: 0.95, distal: 0.81. 3. LCX: No significant stenosis. 4. RCA: No significant stenosis.  IMPRESSION: 1. CT FFR analysis showed borderline lesion in the proximal LAD, given that it is proximal LAD a cardiac catheterization should be considered.  EKG:  EKG no new data.  Sinus bradycardia, low voltage, decreased R wave forces noted on tracing performed 2021, August.  Low voltage raises question of amyloidosis.  Recent Labs: No results found for requested labs within last 8760 hours.  Recent Lipid Panel    Component Value Date/Time   CHOL 122 10/28/2018 0832   TRIG 108 10/28/2018 0832   HDL 55 10/28/2018 0832   CHOLHDL 2.2 10/28/2018 0832   LDLCALC 45 10/28/2018  0832    Physical Exam:    VS:  BP (!) 146/84   Pulse 67   Ht 5' 5.5" (1.664 m)   Wt 183 lb 6.4 oz (83.2 kg)   SpO2 97%   BMI 30.06 kg/m     Wt Readings from Last 3 Encounters:  06/17/20 183 lb 6.4 oz (83.2 kg)  11/29/19 183 lb (83 kg)  10/20/19 179 lb 12.8 oz (81.6 kg)     GEN: Overweight. No acute distress HEENT: Normal NECK: No JVD. LYMPHATICS: No lymphadenopathy CARDIAC: 3/6 crescendo decrescendo systolic murmur. RRR with S4 gallop, but no edema. VASCULAR:  Normal Pulses. No bruits. RESPIRATORY:  Clear to auscultation without rales, wheezing or rhonchi  ABDOMEN: Soft, non-tender, non-distended, No pulsatile mass, MUSCULOSKELETAL: No deformity  SKIN: Warm and dry NEUROLOGIC:  Alert and oriented x 3 PSYCHIATRIC:  Normal affect   ASSESSMENT:    1. Coronary artery disease of native artery of native heart with stable angina pectoris ([place])   2. Aortic valve stenosis, etiology of cardiac valve disease unspecified   3. Paroxysmal atrial fibrillation (HCC)   4. Secondary hypercoagulable state ([place])   5. Hypercholesteremia   6. Essential hypertension   7. Educated about COVID-19 virus infection   8. Polymyalgia rheumatica (H[facility])    PLAN:    In order of problems listed above:  1. Secondary prevention discussed.  Blood pressure is a little high today.  States this is atypical from her usual recordings where she usually runs less than 135/80 mmHg. 2. Murmur is compatible with aortic stenosis.  Echo will be done in the summer and then repeated again before next year's visit in May 2023. 3. Asymptomatic atrial fibrillation.  She feels that high-dose prednisone was contributing to episodes.  She is on Eliquis without complications. 4. Continue Eliquis for stroke prevention.  Chads Vasc score is 3 5. Continue Repatha 6. Monitor blood pressure.  Current target is still 130/80 mmHg.  At her birthday in [birth year] the target becomes 140/80 mmHg.  We discussed low-salt  diet. 7. Vaccinated and boosted. 8. Continue appropriate therapy.  Overall education and awareness concerning primary/secondary risk prevention was discussed in detail: LDL less than 70, hemoglobin A1c less than 7, blood pressure target less than 130/80  mmHg, >150 minutes of moderate aerobic activity per week, avoidance of smoking, weight control (via diet and exercise), and continued surveillance/management of/for obstructive sleep apnea.  Natural history of aortic valve stenosis was discussed in detail.  Cardinal symptoms of angina, syncope, and dyspnea were reviewed and significance relative to prognosis was described.  The importance of sequential imaging for disease monitoring was emphasized.  Work-up including possible heart catheterization and CT angiography were described as essential components of staging for therapy.  Treatment options, TAVR and SAVR, were discussed in some detail with emphasis on TAVR.    Medication Adjustments/Labs and Tests Ordered: Current medicines are reviewed at length with the patient today.  Concerns regarding medicines are outlined above.  Orders Placed This Encounter  Procedures  . ECHOCARDIOGRAM COMPLETE   No orders of the defined types were placed in this encounter.   Patient Instructions  Medication Instructions:  Your physician recommends that you continue on your current medications as directed. Please refer to the Current Medication list given to you today.  *If you need a refill on your cardiac medications before your next appointment, please call your pharmacy*   Lab Work: None If you have labs (blood work) drawn today and your tests are completely normal, you will receive your results only by: [name] MyChart Message (if you have MyChart) OR . A paper copy in the mail If you have any lab test that is abnormal or we need to change your treatment, we will call you to review the results.   Testing/Procedures: Your physician has requested that  you have an echocardiogram sometime this summer. Echocardiography is a painless test that uses sound waves to create images of your heart. It provides your doctor with information about the size and shape of your heart and how well your heart's chambers and valves are working. This procedure takes approximately one hour. There are no restrictions for this procedure.   Follow-Up: At [facility], you and your health needs are our priority.  As part of our continuing mission to provide you with exceptional heart care, we have created designated Provider Care Teams.  These Care Teams include your primary Cardiologist (physician) and Advanced Practice Providers (APPs -  Physician Assistants and Nurse Practitioners) who all work together to provide you with the care you need, when you need it.  We recommend signing up for the patient portal called "MyChart".  Sign up information is provided on this After Visit Summary.  MyChart is used to connect with patients for Virtual Visits (Telemedicine).  Patients are able to view lab/test results, encounter notes, upcoming appointments, etc.  Non-urgent messages can be sent to your provider as well.   To learn more about what you can do with MyChart, go to [email or web address].    Your next appointment:   14 month(s)- May 2023.    The format for your next appointment:   In Person  Provider:   You may see Sinclair Grooms, MD or one of the following Advanced Practice Providers on your designated Care Team:    Kathyrn Drown, NP    Other Instructions      Signed, Sinclair Grooms, MD  06/17/2020 9:03 AM    [place]

## 2020-06-17 ENCOUNTER — Other Ambulatory Visit: Payer: Self-pay

## 2020-06-17 ENCOUNTER — Ambulatory Visit (INDEPENDENT_AMBULATORY_CARE_PROVIDER_SITE_OTHER): Payer: Medicare Other | Admitting: Interventional Cardiology

## 2020-06-17 ENCOUNTER — Encounter: Payer: Self-pay | Admitting: Interventional Cardiology

## 2020-06-17 VITALS — BP 146/84 | HR 67 | Ht 65.5 in | Wt 183.4 lb

## 2020-06-17 DIAGNOSIS — I10 Essential (primary) hypertension: Secondary | ICD-10-CM | POA: Diagnosis not present

## 2020-06-17 DIAGNOSIS — Z7189 Other specified counseling: Secondary | ICD-10-CM | POA: Diagnosis not present

## 2020-06-17 DIAGNOSIS — D6869 Other thrombophilia: Secondary | ICD-10-CM

## 2020-06-17 DIAGNOSIS — E7800 Pure hypercholesterolemia, unspecified: Secondary | ICD-10-CM

## 2020-06-17 DIAGNOSIS — I480 Paroxysmal atrial fibrillation: Secondary | ICD-10-CM | POA: Diagnosis not present

## 2020-06-17 DIAGNOSIS — I350 Nonrheumatic aortic (valve) stenosis: Secondary | ICD-10-CM | POA: Diagnosis not present

## 2020-06-17 DIAGNOSIS — I25118 Atherosclerotic heart disease of native coronary artery with other forms of angina pectoris: Secondary | ICD-10-CM | POA: Diagnosis not present

## 2020-06-17 DIAGNOSIS — M353 Polymyalgia rheumatica: Secondary | ICD-10-CM | POA: Diagnosis not present

## 2020-06-17 NOTE — Patient Instructions (Signed)
Medication Instructions:  Your physician recommends that you continue on your current medications as directed. Please refer to the Current Medication list given to you today.  *If you need a refill on your cardiac medications before your next appointment, please call your pharmacy*   Lab Work: None If you have labs (blood work) drawn today and your tests are completely normal, you will receive your results only by: [name] MyChart Message (if you have MyChart) OR . A paper copy in the mail If you have any lab test that is abnormal or we need to change your treatment, we will call you to review the results.   Testing/Procedures: Your physician has requested that you have an echocardiogram sometime this summer. Echocardiography is a painless test that uses sound waves to create images of your heart. It provides your doctor with information about the size and shape of your heart and how well your heart's chambers and valves are working. This procedure takes approximately one hour. There are no restrictions for this procedure.   Follow-Up: At [facility], you and your health needs are our priority.  As part of our continuing mission to provide you with exceptional heart care, we have created designated Provider Care Teams.  These Care Teams include your primary Cardiologist (physician) and Advanced Practice Providers (APPs -  Physician Assistants and Nurse Practitioners) who all work together to provide you with the care you need, when you need it.  We recommend signing up for the patient portal called "MyChart".  Sign up information is provided on this After Visit Summary.  MyChart is used to connect with patients for Virtual Visits (Telemedicine).  Patients are able to view lab/test results, encounter notes, upcoming appointments, etc.  Non-urgent messages can be sent to your provider as well.   To learn more about what you can do with MyChart, go to [email or web address].    Your next  appointment:   14 month(s)- May 2023.    The format for your next appointment:   In Person  Provider:   You may see Sinclair Grooms, MD or one of the following Advanced Practice Providers on your designated Care Team:    Kathyrn Drown, NP    Other Instructions

## 2020-06-18 NOTE — Progress Notes (Signed)
[facility] Summary Report / Loop Recorder 

## 2020-06-28 ENCOUNTER — Encounter: Payer: Self-pay | Admitting: Obstetrics & Gynecology

## 2020-06-28 DIAGNOSIS — Z1231 Encounter for screening mammogram for malignant neoplasm of breast: Secondary | ICD-10-CM | POA: Diagnosis not present

## 2020-07-01 ENCOUNTER — Other Ambulatory Visit: Payer: Self-pay | Admitting: *Deleted

## 2020-07-01 MED ORDER — NITROGLYCERIN 0.4 MG SL SUBL: 0.4000 mg | SUBLINGUAL_TABLET | SUBLINGUAL | 3 refills | Status: DC | PRN

## 2020-07-03 ENCOUNTER — Ambulatory Visit (HOSPITAL_BASED_OUTPATIENT_CLINIC_OR_DEPARTMENT_OTHER): Payer: Medicare Other | Admitting: Obstetrics & Gynecology

## 2020-07-05 MED ORDER — NITROGLYCERIN 0.4 MG SL SUBL: 0.4000 mg | SUBLINGUAL_TABLET | SUBLINGUAL | 3 refills | Status: AC | PRN

## 2020-07-08 ENCOUNTER — Telehealth: Payer: Self-pay | Admitting: Emergency Medicine

## 2020-07-08 ENCOUNTER — Ambulatory Visit (INDEPENDENT_AMBULATORY_CARE_PROVIDER_SITE_OTHER): Payer: Medicare Other

## 2020-07-08 DIAGNOSIS — I480 Paroxysmal atrial fibrillation: Secondary | ICD-10-CM

## 2020-07-08 LAB — CUP PACEART REMOTE DEVICE CHECK
Date Time Interrogation Session: 20220328033617
Implantable Pulse Generator Implant Date: 20190501

## 2020-07-08 NOTE — Telephone Encounter (Signed)
LMOM to call office due to AF episode alet from [facility] 07/06/20 that was in progress.  Assess for s/sx. + Eliquis , Lopressor 25 mg prn q 6 hours, Toprol XL 25 mg daily.

## 2020-07-09 NOTE — Telephone Encounter (Signed)
The patient would like for the nurse to call her on her cell phone [phone].

## 2020-07-09 NOTE — Telephone Encounter (Signed)
Spoke with pt.  She reports she was aware of AF episode. She felt fatigued. She took her PRN dose of Metoprolol on Sunday and felt better about a half hour later.    Pt sent f/u transmission that confirms episode lasted just over 11 hours, ending Sunday morning.

## 2020-07-11 DIAGNOSIS — E669 Obesity, unspecified: Secondary | ICD-10-CM | POA: Diagnosis not present

## 2020-07-11 DIAGNOSIS — Z6830 Body mass index (BMI) 30.0-30.9, adult: Secondary | ICD-10-CM | POA: Diagnosis not present

## 2020-07-11 DIAGNOSIS — M316 Other giant cell arteritis: Secondary | ICD-10-CM | POA: Diagnosis not present

## 2020-07-11 DIAGNOSIS — M8580 Other specified disorders of bone density and structure, unspecified site: Secondary | ICD-10-CM | POA: Diagnosis not present

## 2020-07-11 DIAGNOSIS — Z7952 Long term (current) use of systemic steroids: Secondary | ICD-10-CM | POA: Diagnosis not present

## 2020-07-11 DIAGNOSIS — R6884 Jaw pain: Secondary | ICD-10-CM | POA: Diagnosis not present

## 2020-07-11 DIAGNOSIS — R519 Headache, unspecified: Secondary | ICD-10-CM | POA: Diagnosis not present

## 2020-07-11 DIAGNOSIS — M353 Polymyalgia rheumatica: Secondary | ICD-10-CM | POA: Diagnosis not present

## 2020-07-12 ENCOUNTER — Ambulatory Visit (INDEPENDENT_AMBULATORY_CARE_PROVIDER_SITE_OTHER): Payer: Medicare Other | Admitting: Obstetrics & Gynecology

## 2020-07-12 ENCOUNTER — Other Ambulatory Visit: Payer: Self-pay

## 2020-07-12 ENCOUNTER — Encounter (HOSPITAL_BASED_OUTPATIENT_CLINIC_OR_DEPARTMENT_OTHER): Payer: Self-pay | Admitting: Obstetrics & Gynecology

## 2020-07-12 VITALS — BP 161/82 | HR 61 | Ht 65.5 in | Wt 183.0 lb

## 2020-07-12 DIAGNOSIS — L292 Pruritus vulvae: Secondary | ICD-10-CM | POA: Diagnosis not present

## 2020-07-12 DIAGNOSIS — D219 Benign neoplasm of connective and other soft tissue, unspecified: Secondary | ICD-10-CM | POA: Diagnosis not present

## 2020-07-12 DIAGNOSIS — Z780 Asymptomatic menopausal state: Secondary | ICD-10-CM

## 2020-07-12 DIAGNOSIS — Z01419 Encounter for gynecological examination (general) (routine) without abnormal findings: Secondary | ICD-10-CM

## 2020-07-12 DIAGNOSIS — I25118 Atherosclerotic heart disease of native coronary artery with other forms of angina pectoris: Secondary | ICD-10-CM

## 2020-07-12 DIAGNOSIS — N870 Mild cervical dysplasia: Secondary | ICD-10-CM

## 2020-07-12 DIAGNOSIS — Z9189 Other specified personal risk factors, not elsewhere classified: Secondary | ICD-10-CM

## 2020-07-12 MED ORDER — MOMETASONE FUROATE 0.1 % EX OINT: TOPICAL_OINTMENT | CUTANEOUS | 1 refills | Status: DC

## 2020-07-12 NOTE — Progress Notes (Signed)
80 y.o. G54P2012 Widowed White or Caucasian female here for breast and pelvic exam.  She is a former patient of French Ana, CNM, but I've never seen her in the past.  H/o lichen sclerosus on examination noted in chart and prior noted by French Ana.  No biopsy has been obtained.  Denies vaginal bleeding.  Has Afib.  On chronic anticoagulation.  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  Recent pap smears have been normal.  We discussed current guidelines and stopping pap smears.    Cardiology: Dr. Tamala Julian Rheumatology: Dr. Amil Amen GI:  Dr. Cristina Gong  No LMP recorded. Patient is postmenopausal.          Sexually active: No.  H/O STD:  no  Health Maintenance: PCP:  Dr. Joylene Draft.  Last wellness appt was in 2022.  Did blood work at that appt:  Is seen every 6 months Vaccines are up to date:  Unsure.  Will have release signed. Colonoscopy:  2021, WNL MMG:  10/2019 BMD:  With PCP Last pap smear:  2020.   H/o abnormal pap smear:  no    reports that she has never smoked. She has never used smokeless tobacco. She reports current alcohol use of about 4.0 - 6.0 standard drinks of alcohol per week. She reports that she does not use drugs.  Past Medical History:  Diagnosis Date  . Aortic stenosis, moderate   . Arthritis    osteoarthritis  . Atrial fibrillation ([place])   . Dysplasia of cervix, low grade (CIN 1) 1990   HPV, Cryo, Laser  . 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 Fibroid 2002   1 cm, 2 cm  . GERD (gastroesophageal reflux disease)   . Hypercholesteremia   . Hypertension   . Hyperthyroidism     Past Surgical History:  Procedure Laterality Date  . BREAST BIOPSY Bilateral 1970's   x2 one a side  . CATARACT EXTRACTION     bilateral  . LOOP RECORDER INSERTION N/A 08/11/2017   Procedure: LOOP RECORDER INSERTION;  Surgeon: Thompson Grayer, MD;  Location: [place] [facility];  Service: Cardiovascular;  Laterality: N/A;  . RETINAL DETACHMENT SURGERY  2000  .  THYROIDECTOMY    . TONSILLECTOMY    . TOTAL KNEE ARTHROPLASTY  04/25/2012   Procedure: TOTAL KNEE BILATERAL;  Surgeon: Gearlean Alf, MD;  Location: [facility];  Service: Orthopedics;  Laterality: Bilateral;    Current Outpatient Medications  Medication Sig Dispense Refill  . alendronate (FOSAMAX) 70 MG tablet Take 70 mg by mouth once a week.    [name] amoxicillin (AMOXIL) 500 MG capsule TAKE 4 TABLETS 30 60 MINUTES PRIOR TO DENTAL WORK    . apixaban (ELIQUIS) 5 MG TABS tablet Take 1 tablet (5 mg total) by mouth 2 (two) times daily. 60 tablet 11  . CALCIUM PO Take 1 tablet by mouth daily.     . Cholecalciferol (VITAMIN D) 50 MCG (2000 UT) tablet Take 2,000 Units by mouth daily.    . Cyanocobalamin (B-12 PO) Take 1 tablet by mouth daily.    [name] levothyroxine (SYNTHROID, LEVOTHROID) 88 MCG tablet Take 88 mcg by mouth daily before breakfast.    . methotrexate (RHEUMATREX) 2.5 MG tablet Take 6 tablets by mouth once a week.    . metoprolol succinate (TOPROL-XL) 25 MG 24 hr tablet Take 25 mg by mouth daily.    . mometasone (ELOCON) 0.1 % ointment Apply topically to skin  once weekly 45 g 1  . nitroGLYCERIN (NITROSTAT) 0.4 MG SL tablet Place 1 tablet (0.4 mg total) under the tongue every 5 (five) minutes as needed for chest pain. 25 tablet 3  . pantoprazole (PROTONIX) 40 MG tablet Take 40 mg by mouth daily.    . predniSONE (DELTASONE) 10 MG tablet Take 9 mg by mouth daily with breakfast.     . REPATHA SURECLICK 676 MG/ML SOAJ [id number] Milligram(s) SUB-Q Every 2 Weeks    . telmisartan (MICARDIS) 20 MG tablet SMARTSIG:1 Tablet(s) By Mouth Every Evening    . zolpidem (AMBIEN) 5 MG tablet Take 2.5 mg by mouth at bedtime as needed for sleep.     [name] ibandronate (BONIVA) 150 MG tablet Take 150 mg by mouth every 30 (thirty) days. Take in the morning with a full glass of water, on an empty stomach, and do not take anything else by mouth or lie down for the next 30 min. (Patient not taking: Reported on 07/12/2020)     . metoprolol tartrate (LOPRESSOR) 25 MG tablet Take 1 tablet (25 mg total) by mouth every 6 (six) hours as needed (heart racing). (Patient not taking: Reported on 07/12/2020) 45 tablet 1   No current facility-administered medications for this visit.    Family History  Problem Relation Age of Onset  . Breast cancer Maternal Grandmother   . Heart attack Maternal Grandmother   . Aortic aneurysm Mother   . Clotting disorder Mother   . Breast cancer Maternal Aunt 63  . Heart disease Brother        stints  . Healthy Father     Review of Systems  All other systems reviewed and are negative.   Exam:   BP (!) 161/82 (BP Location: Right Arm, Patient Position: Sitting, Cuff Size: Normal)   Pulse 61   Ht 5' 5.5" (1.664 m)   Wt 183 lb (83 kg)   BMI 29.99 kg/m   Height: 5' 5.5" (166.4 cm)  General appearance: alert, cooperative and appears stated age Breasts: normal appearance, no masses or tenderness Abdomen: soft, non-tender; bowel sounds normal; no masses,  no organomegaly Lymph nodes: Cervical, supraclavicular, and axillary nodes normal.  No abnormal inguinal nodes palpated Neurologic: Grossly normal  Pelvic: External genitalia:  Hypopigmentation of inner labia majora, no thickening or ulcerations              Urethra:  normal appearing urethra with no masses, tenderness or lesions              Bartholins and Skenes: normal                 Vagina: normal appearing vagina with normal color and discharge, no lesions              Cervix: no lesions              Pap taken: No. Bimanual Exam:  Uterus:  normal size, contour, position, consistency, mobility, non-tender              Adnexa: normal adnexa and no mass, fullness, tenderness               Rectovaginal: Confirms               Anus:  normal sphincter tone, no lesions  Chaperone, Cydney Ok, CMA, was present for exam.  Assessment/Plan: 1. GYN exam for high-risk Medicare patient - last pap smear normal 2020.  Guidelines  reviewed. - MMG 10/2019 - colonoscopy  2021 - BMD 10/2019 - lab work done with Dr. Joylene Draft - release for vaccines from [facility] signed today  2. Postmenopausal - no HRT  3. Vulvar itching - skin changes c/w lichen sclerosus.  Increased risks of vulvar cancer discussed.  Maintenance therapy discussed.  Will start mometasone. - mometasone (ELOCON) 0.1 % ointment; Apply topically to skin once weekly  Dispense: 45 g; Refill: 1 - advised to return for biopsy with any changes in symptoms.  Yearly exams recommended.    4. Dysplasia of cervix, low grade (CIN 1) -h/o 1990  5. Fibroid

## 2020-07-14 ENCOUNTER — Encounter (HOSPITAL_BASED_OUTPATIENT_CLINIC_OR_DEPARTMENT_OTHER): Payer: Self-pay | Admitting: Obstetrics & Gynecology

## 2020-07-15 DIAGNOSIS — Z961 Presence of intraocular lens: Secondary | ICD-10-CM | POA: Diagnosis not present

## 2020-07-22 NOTE — Progress Notes (Signed)
[facility] Summary Report / Loop Recorder 

## 2020-08-08 DIAGNOSIS — Z7952 Long term (current) use of systemic steroids: Secondary | ICD-10-CM | POA: Diagnosis not present

## 2020-08-08 DIAGNOSIS — M353 Polymyalgia rheumatica: Secondary | ICD-10-CM | POA: Diagnosis not present

## 2020-08-08 DIAGNOSIS — M316 Other giant cell arteritis: Secondary | ICD-10-CM | POA: Diagnosis not present

## 2020-08-08 DIAGNOSIS — Z9229 Personal history of other drug therapy: Secondary | ICD-10-CM | POA: Diagnosis not present

## 2020-08-10 DIAGNOSIS — I10 Essential (primary) hypertension: Secondary | ICD-10-CM | POA: Diagnosis not present

## 2020-08-10 DIAGNOSIS — I480 Paroxysmal atrial fibrillation: Secondary | ICD-10-CM | POA: Diagnosis not present

## 2020-08-10 DIAGNOSIS — E785 Hyperlipidemia, unspecified: Secondary | ICD-10-CM | POA: Diagnosis not present

## 2020-08-10 DIAGNOSIS — K219 Gastro-esophageal reflux disease without esophagitis: Secondary | ICD-10-CM | POA: Diagnosis not present

## 2020-08-12 ENCOUNTER — Ambulatory Visit (INDEPENDENT_AMBULATORY_CARE_PROVIDER_SITE_OTHER): Payer: Medicare Other

## 2020-08-12 DIAGNOSIS — I480 Paroxysmal atrial fibrillation: Secondary | ICD-10-CM | POA: Diagnosis not present

## 2020-08-13 DIAGNOSIS — M8589 Other specified disorders of bone density and structure, multiple sites: Secondary | ICD-10-CM | POA: Diagnosis not present

## 2020-08-13 LAB — CUP PACEART REMOTE DEVICE CHECK
Date Time Interrogation Session: 20220430033803
Implantable Pulse Generator Implant Date: 20190501

## 2020-08-19 DIAGNOSIS — Z23 Encounter for immunization: Secondary | ICD-10-CM | POA: Diagnosis not present

## 2020-09-02 NOTE — Progress Notes (Signed)
[facility] Summary Report / Loop Recorder 

## 2020-09-10 DIAGNOSIS — R519 Headache, unspecified: Secondary | ICD-10-CM | POA: Diagnosis not present

## 2020-09-10 DIAGNOSIS — E785 Hyperlipidemia, unspecified: Secondary | ICD-10-CM | POA: Diagnosis not present

## 2020-09-10 DIAGNOSIS — K219 Gastro-esophageal reflux disease without esophagitis: Secondary | ICD-10-CM | POA: Diagnosis not present

## 2020-09-10 DIAGNOSIS — E669 Obesity, unspecified: Secondary | ICD-10-CM | POA: Diagnosis not present

## 2020-09-10 DIAGNOSIS — Z6831 Body mass index (BMI) 31.0-31.9, adult: Secondary | ICD-10-CM | POA: Diagnosis not present

## 2020-09-10 DIAGNOSIS — M8580 Other specified disorders of bone density and structure, unspecified site: Secondary | ICD-10-CM | POA: Diagnosis not present

## 2020-09-10 DIAGNOSIS — M353 Polymyalgia rheumatica: Secondary | ICD-10-CM | POA: Diagnosis not present

## 2020-09-10 DIAGNOSIS — R5383 Other fatigue: Secondary | ICD-10-CM | POA: Diagnosis not present

## 2020-09-10 DIAGNOSIS — R6884 Jaw pain: Secondary | ICD-10-CM | POA: Diagnosis not present

## 2020-09-10 DIAGNOSIS — I10 Essential (primary) hypertension: Secondary | ICD-10-CM | POA: Diagnosis not present

## 2020-09-10 DIAGNOSIS — D539 Nutritional anemia, unspecified: Secondary | ICD-10-CM | POA: Diagnosis not present

## 2020-09-10 DIAGNOSIS — Z7952 Long term (current) use of systemic steroids: Secondary | ICD-10-CM | POA: Diagnosis not present

## 2020-09-10 DIAGNOSIS — M316 Other giant cell arteritis: Secondary | ICD-10-CM | POA: Diagnosis not present

## 2020-09-10 DIAGNOSIS — I480 Paroxysmal atrial fibrillation: Secondary | ICD-10-CM | POA: Diagnosis not present

## 2020-09-15 LAB — CUP PACEART REMOTE DEVICE CHECK
Date Time Interrogation Session: 20220602034038
Implantable Pulse Generator Implant Date: 20190501

## 2020-09-16 ENCOUNTER — Ambulatory Visit (INDEPENDENT_AMBULATORY_CARE_PROVIDER_SITE_OTHER): Payer: Medicare Other

## 2020-09-16 DIAGNOSIS — I480 Paroxysmal atrial fibrillation: Secondary | ICD-10-CM

## 2020-09-25 ENCOUNTER — Other Ambulatory Visit: Payer: Self-pay

## 2020-09-25 ENCOUNTER — Ambulatory Visit (HOSPITAL_COMMUNITY): Payer: Medicare Other | Attending: Internal Medicine

## 2020-09-25 DIAGNOSIS — I350 Nonrheumatic aortic (valve) stenosis: Secondary | ICD-10-CM | POA: Diagnosis not present

## 2020-09-25 LAB — ECHOCARDIOGRAM COMPLETE
AR max vel: 0.76 cm2
AV Area VTI: 0.67 cm2
AV Area mean vel: 0.69 cm2
AV Mean grad: 35 mmHg
AV Peak grad: 48.2 mmHg
Ao pk vel: 3.47 m/s
Area-P 1/2: 3.6 cm2
P 1/2 time: 578 msec

## 2020-10-08 NOTE — Progress Notes (Signed)
[facility] Summary Report / Loop Recorder 

## 2020-10-17 DIAGNOSIS — M353 Polymyalgia rheumatica: Secondary | ICD-10-CM | POA: Diagnosis not present

## 2020-10-17 DIAGNOSIS — Z7952 Long term (current) use of systemic steroids: Secondary | ICD-10-CM | POA: Diagnosis not present

## 2020-10-20 LAB — CUP PACEART REMOTE DEVICE CHECK
Date Time Interrogation Session: 20220705034238
Implantable Pulse Generator Implant Date: 20190501

## 2020-10-21 ENCOUNTER — Ambulatory Visit (INDEPENDENT_AMBULATORY_CARE_PROVIDER_SITE_OTHER): Payer: Medicare Other

## 2020-10-21 DIAGNOSIS — I480 Paroxysmal atrial fibrillation: Secondary | ICD-10-CM | POA: Diagnosis not present

## 2020-11-10 DIAGNOSIS — I10 Essential (primary) hypertension: Secondary | ICD-10-CM | POA: Diagnosis not present

## 2020-11-10 DIAGNOSIS — K219 Gastro-esophageal reflux disease without esophagitis: Secondary | ICD-10-CM | POA: Diagnosis not present

## 2020-11-10 DIAGNOSIS — I480 Paroxysmal atrial fibrillation: Secondary | ICD-10-CM | POA: Diagnosis not present

## 2020-11-10 DIAGNOSIS — E785 Hyperlipidemia, unspecified: Secondary | ICD-10-CM | POA: Diagnosis not present

## 2020-11-13 NOTE — Progress Notes (Signed)
[facility] Summary Report / Loop Recorder 

## 2020-11-15 DIAGNOSIS — E663 Overweight: Secondary | ICD-10-CM | POA: Diagnosis not present

## 2020-11-15 DIAGNOSIS — M316 Other giant cell arteritis: Secondary | ICD-10-CM | POA: Diagnosis not present

## 2020-11-15 DIAGNOSIS — M353 Polymyalgia rheumatica: Secondary | ICD-10-CM | POA: Diagnosis not present

## 2020-11-15 DIAGNOSIS — R5383 Other fatigue: Secondary | ICD-10-CM | POA: Diagnosis not present

## 2020-11-15 DIAGNOSIS — D539 Nutritional anemia, unspecified: Secondary | ICD-10-CM | POA: Diagnosis not present

## 2020-11-15 DIAGNOSIS — M8580 Other specified disorders of bone density and structure, unspecified site: Secondary | ICD-10-CM | POA: Diagnosis not present

## 2020-11-15 DIAGNOSIS — Z6829 Body mass index (BMI) 29.0-29.9, adult: Secondary | ICD-10-CM | POA: Diagnosis not present

## 2020-11-15 DIAGNOSIS — Z7952 Long term (current) use of systemic steroids: Secondary | ICD-10-CM | POA: Diagnosis not present

## 2020-11-25 ENCOUNTER — Ambulatory Visit (INDEPENDENT_AMBULATORY_CARE_PROVIDER_SITE_OTHER): Payer: Medicare Other

## 2020-11-25 DIAGNOSIS — I480 Paroxysmal atrial fibrillation: Secondary | ICD-10-CM

## 2020-11-26 LAB — CUP PACEART REMOTE DEVICE CHECK
Date Time Interrogation Session: 20220814232148
Implantable Pulse Generator Implant Date: 20190501

## 2020-12-11 NOTE — Progress Notes (Signed)
Cardiology Office Note:    Date:  12/13/2020   ID:  BRIGHTON ABOOD, DOB [birth year], MRN [id number]  PCP:  Crist Infante, MD  Cardiologist:  Sinclair Grooms, MD   Referring MD: Crist Infante, MD   Chief Complaint  Patient presents with   Coronary Artery Disease   Cardiac Valve Problem    History of Present Illness:    Marissa Powers is a 80 y.o. female with a hx of hypertension, hyperlipidemia, atypical CP, syncope, PAF identified on Loop reorder, and moderate AS.  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  She notices some shortness of breath with walking up hills.  She does not feel short of breath at rest or have orthopnea.  She is able to lie flat.  The shortness of breath is associated with some sensation of chest tightness.  This is not changed over the past 6 months.  No lightheadedness or dizziness associated.  No peripheral edema.  No discomfort in the chest at rest.  Does 45 minutes of water aerobics 5 days a week without any symptoms at all.  We discussed the findings on the most recent echo.  Past Medical History:  Diagnosis Date   Aortic stenosis, moderate    Arthritis    osteoarthritis   Atrial fibrillation (HCC)    Dysplasia of cervix, low grade (CIN 1) 1990   HPV, Cryo, Laser   Fibroadenoma    Left, at 5 o'clock, not excised    Fibroid 2002   1 cm, 2 cm   GERD (gastroesophageal reflux disease)    Hypercholesteremia    Hypertension    Hyperthyroidism     Past Surgical History:  Procedure Laterality Date   BREAST BIOPSY Bilateral 1970's   x2 one a side   CATARACT EXTRACTION     bilateral   LOOP RECORDER INSERTION N/A 08/11/2017   Procedure: LOOP RECORDER INSERTION;  Surgeon: Thompson Grayer, MD;  Location: [facility];  Service: Cardiovascular;  Laterality: N/A;   RETINAL DETACHMENT SURGERY  2000   THYROIDECTOMY     TONSILLECTOMY     TOTAL KNEE ARTHROPLASTY  04/25/2012   Procedure: TOTAL  KNEE BILATERAL;  Surgeon: Gearlean Alf, MD;  Location: [facility];  Service: Orthopedics;  Laterality: Bilateral;    Current Medications: Current Meds  Medication Sig   alendronate (FOSAMAX) 70 MG tablet Take 70 mg by mouth once a week. Take with a full glass of water on an empty stomach.   amoxicillin (AMOXIL) 500 MG capsule TAKE 4 TABLETS 30 60 MINUTES PRIOR TO DENTAL WORK   apixaban (ELIQUIS) 5 MG TABS tablet Take 1 tablet (5 mg total) by mouth 2 (two) times daily.   B-D TB SYRINGE 1CC/27GX1/2" 27G X 1/2" 1 ML MISC Inject into the skin once a week.   CALCIUM PO Take 1 tablet by mouth daily.    Cholecalciferol (VITAMIN D) 50 MCG (2000 UT) tablet Take 2,000 Units by mouth daily.   Cyanocobalamin (B-12 PO) Take 1 tablet by mouth daily.   levothyroxine (SYNTHROID, LEVOTHROID) 88 MCG tablet Take 88 mcg by mouth daily before breakfast.   metoprolol succinate (TOPROL-XL) 25 MG 24 hr tablet Take 25 mg by mouth daily.   mometasone (ELOCON) 0.1 % ointment Apply topically to skin once weekly   nitroGLYCERIN (NITROSTAT) 0.4 MG SL tablet Place 1 tablet (0.4 mg total) under the tongue every 5 (five) minutes as needed for  chest pain.   pantoprazole (PROTONIX) 40 MG tablet Take 40 mg by mouth daily.   predniSONE (DELTASONE) 10 MG tablet Take 9 mg by mouth daily with breakfast.    REPATHA SURECLICK [id number] MG/ML SOAJ [id number] Milligram(s) SUB-Q Every 2 Weeks   telmisartan (MICARDIS) 20 MG tablet SMARTSIG:1 Tablet(s) By Mouth Every Evening   zolpidem (AMBIEN) 5 MG tablet Take 2.5 mg by mouth at bedtime as needed for sleep.      Allergies:   Cashew nut oil   Social History   Socioeconomic History   Marital status: Widowed    Spouse name: Not on file   Number of children: Not on file   Years of education: Not on file   Highest education level: Not on file  Occupational History   Not on file  Tobacco Use   Smoking status: Never   Smokeless tobacco: Never  Substance and Sexual Activity   Alcohol  use: Yes    Alcohol/week: 4.0 - 6.0 standard drinks    Types: 4 - 6 Standard drinks or equivalent per week   Drug use: No   Sexual activity: Not Currently    Partners: Male    Birth control/protection: Post-menopausal  Other Topics Concern   Not on file  Social History Narrative   Not on file   Social Determinants of Health   Financial Resource Strain: Not on file  Food Insecurity: Not on file  Transportation Needs: Not on file  Physical Activity: Not on file  Stress: Not on file  Social Connections: Not on file     Family History: The patient's family history includes Aortic aneurysm in her mother; Breast cancer in her maternal grandmother; Breast cancer (age of onset: 58) in her maternal aunt; Clotting disorder in her mother; Healthy in her father; Heart attack in her maternal grandmother; Heart disease in her brother.  ROS:   Please see the history of present illness.    She does not want to rush aortic valve therapies.  All other systems reviewed and are negative.  EKGs/Labs/Other Studies Reviewed:    The following studies were reviewed today:  2 D Doppler Echo 09/2020: IMPRESSIONS     1. Left ventricular ejection fraction, by estimation, is 65 to 70%. The  left ventricle has normal function. The left ventricle has no regional  wall motion abnormalities. There is mild left ventricular hypertrophy.  Left ventricular diastolic parameters  are consistent with Grade I diastolic dysfunction (impaired relaxation).   2. Right ventricular systolic function is normal. The right ventricular  size is normal.   3. The mitral valve is abnormal. Trivial mitral valve regurgitation.   4. The aortic valve is calcified. Aortic valve regurgitation is trivial.  Moderate to severe aortic valve stenosis. Aortic regurgitation PHT  measures 578 msec. Aortic valve area, by VTI measures 0.67 cm. Aortic  valve mean gradient measures 35.0 mmHg.  Aortic valve Vmax measures 3.47 m/s. Peak  gradient 48 mmHg. DI is 0.26.   5. The inferior vena cava is normal in size with greater than 50%  respiratory variability, suggesting right atrial pressure of 3 mmHg.   6. Left atrial size was mildly dilated.   Comparison(s): Changes from prior study are noted. 09/29/2019: LVEF 65-70%,  moderate AS - mean gradient 25 mmHg, DI 0.28.   EKG:  EKG sinus rhythm, left anterior hemiblock, poor R wave progression V1 through V5.  When compared to prior and August 2021, no changes noted.  Recent Labs: No results found  for requested labs within last 8760 hours.  Recent Lipid Panel    Component Value Date/Time   CHOL 122 10/28/2018 0832   TRIG 108 10/28/2018 0832   HDL 55 10/28/2018 0832   CHOLHDL 2.2 10/28/2018 0832   LDLCALC 45 10/28/2018 0832    Physical Exam:    VS:  BP 128/70   Pulse 74   Ht 5' 5.5" (1.664 m)   Wt 181 lb 3.2 oz (82.2 kg)   SpO2 98%   BMI 29.69 kg/m     Wt Readings from Last 3 Encounters:  12/13/20 181 lb 3.2 oz (82.2 kg)  07/12/20 183 lb (83 kg)  06/17/20 183 lb 6.4 oz (83.2 kg)     GEN: Slightly overweight. No acute distress HEENT: Normal NECK: No JVD. LYMPHATICS: No lymphadenopathy CARDIAC: 3/6 to 4/6 crescendo decrescendo systolic murmur of aortic stenosis.  No diastolic murmur.  Murmur. RRR S4 but no S3 gallop, or edema. VASCULAR:  Normal Pulses. No bruits. RESPIRATORY:  Clear to auscultation without rales, wheezing or rhonchi  ABDOMEN: Soft, non-tender, non-distended, No pulsatile mass, MUSCULOSKELETAL: No deformity  SKIN: Warm and dry NEUROLOGIC:  Alert and oriented x 3 PSYCHIATRIC:  Normal affect   ASSESSMENT:    1. Aortic valve stenosis, etiology of cardiac valve disease unspecified   2. Coronary artery disease of native artery of native heart with stable angina pectoris (HCC)   3. Paroxysmal atrial fibrillation ([place])   4. Secondary hypercoagulable state ([place])   5. Hypercholesteremia   6. Essential hypertension    PLAN:    In order of  problems listed above:  27-monthfollow-up with echo prior to the office visit.  Continue to monitor symptoms.  Let [place]ore[place]now if any dramatic change in overall condition.  I am interested in any limitation in exertional tolerance, chest discomfort with typical activities, syncope or near syncope.  She understands. Stable angina pectoris.  Continue secondary risk prevention. She has had 1-2 episodes of atrial fibs since last being seen.  These are short-lived.  She is continues on anticoagulation therapy. Continue Eliquis.  Watch for evidence of bleeding. Continue Repatha.  Target LDL less than 70.  Recently 672 Blood pressure is adequate.  On low-dose Lopressor and Micardis.  Overall education and awareness concerning secondary risk prevention was discussed in detail: LDL less than 70, hemoglobin A1c less than 7, blood pressure target less than 130/80 mmHg, >150 minutes of moderate aerobic activity per week, avoidance of smoking, weight control (via diet and exercise), and continued surveillance/management of/for obstructive sleep apnea.   Natural history of aortic valve stenosis was discussed in detail.  Cardinal symptoms of angina, syncope, and dyspnea were reviewed and significance relative to prognosis was described.  The importance of sequential imaging for disease monitoring was emphasized.  Work-up including possible heart catheterization and CT angiography were described as essential components of staging for therapy.  Treatment options, TAVR and SAVR, were discussed in some detail with emphasis on TAVR.    Medication Adjustments/Labs and Tests Ordered: Current medicines are reviewed at length with the patient today.  Concerns regarding medicines are outlined above.  Orders Placed This Encounter  Procedures   EKG 12-Lead   No orders of the defined types were placed in this encounter.   There are no Patient Instructions on file for this visit.   Signed, HSinclair Grooms MD   12/13/2020 12:13 PM    [place]

## 2020-12-13 ENCOUNTER — Encounter: Payer: Self-pay | Admitting: Interventional Cardiology

## 2020-12-13 ENCOUNTER — Ambulatory Visit (INDEPENDENT_AMBULATORY_CARE_PROVIDER_SITE_OTHER): Payer: Medicare Other | Admitting: Interventional Cardiology

## 2020-12-13 ENCOUNTER — Other Ambulatory Visit: Payer: Self-pay

## 2020-12-13 VITALS — BP 128/70 | HR 74 | Ht 65.5 in | Wt 181.2 lb

## 2020-12-13 DIAGNOSIS — D649 Anemia, unspecified: Secondary | ICD-10-CM | POA: Diagnosis not present

## 2020-12-13 DIAGNOSIS — E7800 Pure hypercholesterolemia, unspecified: Secondary | ICD-10-CM | POA: Diagnosis not present

## 2020-12-13 DIAGNOSIS — I350 Nonrheumatic aortic (valve) stenosis: Secondary | ICD-10-CM | POA: Diagnosis not present

## 2020-12-13 DIAGNOSIS — I10 Essential (primary) hypertension: Secondary | ICD-10-CM

## 2020-12-13 DIAGNOSIS — I2510 Atherosclerotic heart disease of native coronary artery without angina pectoris: Secondary | ICD-10-CM | POA: Diagnosis not present

## 2020-12-13 DIAGNOSIS — D6869 Other thrombophilia: Secondary | ICD-10-CM

## 2020-12-13 DIAGNOSIS — E039 Hypothyroidism, unspecified: Secondary | ICD-10-CM | POA: Diagnosis not present

## 2020-12-13 DIAGNOSIS — M8589 Other specified disorders of bone density and structure, multiple sites: Secondary | ICD-10-CM | POA: Diagnosis not present

## 2020-12-13 DIAGNOSIS — K589 Irritable bowel syndrome without diarrhea: Secondary | ICD-10-CM | POA: Diagnosis not present

## 2020-12-13 DIAGNOSIS — I480 Paroxysmal atrial fibrillation: Secondary | ICD-10-CM

## 2020-12-13 DIAGNOSIS — I25118 Atherosclerotic heart disease of native coronary artery with other forms of angina pectoris: Secondary | ICD-10-CM

## 2020-12-13 DIAGNOSIS — B372 Candidiasis of skin and nail: Secondary | ICD-10-CM | POA: Diagnosis not present

## 2020-12-13 DIAGNOSIS — M8580 Other specified disorders of bone density and structure, unspecified site: Secondary | ICD-10-CM | POA: Diagnosis not present

## 2020-12-13 DIAGNOSIS — M1990 Unspecified osteoarthritis, unspecified site: Secondary | ICD-10-CM | POA: Diagnosis not present

## 2020-12-13 DIAGNOSIS — D7589 Other specified diseases of blood and blood-forming organs: Secondary | ICD-10-CM | POA: Diagnosis not present

## 2020-12-13 DIAGNOSIS — R7301 Impaired fasting glucose: Secondary | ICD-10-CM | POA: Diagnosis not present

## 2020-12-13 NOTE — Patient Instructions (Signed)
Medication Instructions:  Your physician recommends that you continue on your current medications as directed. Please refer to the Current Medication list given to you today.  *If you need a refill on your cardiac medications before your next appointment, please call your pharmacy*   Lab Work: None If you have labs (blood work) drawn today and your tests are completely normal, you will receive your results only by: [place] (if you have MyChart) OR A paper copy in the mail If you have any lab test that is abnormal or we need to change your treatment, we will call you to review the results.   Testing/Procedures: Your physician has requested that you have an echocardiogram 1-10 days prior to seeing Dr. Tamala Julian back in 6 months. Echocardiography is a painless test that uses sound waves to create images of your heart. It provides your doctor with information about the size and shape of your heart and how well your heart's chambers and valves are working. This procedure takes approximately one hour. There are no restrictions for this procedure.    Follow-Up: At [facility], you and your health needs are our priority.  As part of our continuing mission to provide you with exceptional heart care, we have created designated Provider Care Teams.  These Care Teams include your primary Cardiologist (physician) and Advanced Practice Providers (APPs -  Physician Assistants and Nurse Practitioners) who all work together to provide you with the care you need, when you need it.  We recommend signing up for the patient portal called "MyChart".  Sign up information is provided on this After Visit Summary.  MyChart is used to connect with patients for Virtual Visits (Telemedicine).  Patients are able to view lab/test results, encounter notes, upcoming appointments, etc.  Non-urgent messages can be sent to your provider as well.   To learn more about what you can do with MyChart, go to  [email or web address].    Your next appointment:   6 month(s)  The format for your next appointment:   In Person  Provider:   You may see Sinclair Grooms, MD or one of the following Advanced Practice Providers on your designated Care Team:   Cecilie Kicks, NP   Other Instructions

## 2020-12-14 NOTE — Progress Notes (Signed)
[facility] Summary Report / Loop Recorder 

## 2020-12-30 ENCOUNTER — Ambulatory Visit (INDEPENDENT_AMBULATORY_CARE_PROVIDER_SITE_OTHER): Payer: Medicare Other

## 2020-12-30 DIAGNOSIS — I480 Paroxysmal atrial fibrillation: Secondary | ICD-10-CM | POA: Diagnosis not present

## 2020-12-31 ENCOUNTER — Other Ambulatory Visit (HOSPITAL_BASED_OUTPATIENT_CLINIC_OR_DEPARTMENT_OTHER): Payer: Self-pay

## 2020-12-31 DIAGNOSIS — L292 Pruritus vulvae: Secondary | ICD-10-CM

## 2020-12-31 LAB — CUP PACEART REMOTE DEVICE CHECK
Date Time Interrogation Session: 20220916232228
Implantable Pulse Generator Implant Date: 20190501

## 2020-12-31 MED ORDER — MOMETASONE FUROATE 0.1 % EX OINT: TOPICAL_OINTMENT | CUTANEOUS | 1 refills | Status: DC

## 2021-01-06 NOTE — Progress Notes (Signed)
[facility] Summary Report / Loop Recorder 

## 2021-01-08 DIAGNOSIS — Z23 Encounter for immunization: Secondary | ICD-10-CM | POA: Diagnosis not present

## 2021-01-10 DIAGNOSIS — I480 Paroxysmal atrial fibrillation: Secondary | ICD-10-CM | POA: Diagnosis not present

## 2021-01-10 DIAGNOSIS — K219 Gastro-esophageal reflux disease without esophagitis: Secondary | ICD-10-CM | POA: Diagnosis not present

## 2021-01-10 DIAGNOSIS — I10 Essential (primary) hypertension: Secondary | ICD-10-CM | POA: Diagnosis not present

## 2021-01-10 DIAGNOSIS — E785 Hyperlipidemia, unspecified: Secondary | ICD-10-CM | POA: Diagnosis not present

## 2021-02-03 ENCOUNTER — Ambulatory Visit (INDEPENDENT_AMBULATORY_CARE_PROVIDER_SITE_OTHER): Payer: Medicare Other

## 2021-02-03 DIAGNOSIS — I480 Paroxysmal atrial fibrillation: Secondary | ICD-10-CM | POA: Diagnosis not present

## 2021-02-03 LAB — CUP PACEART REMOTE DEVICE CHECK
Date Time Interrogation Session: 20221019232615
Implantable Pulse Generator Implant Date: 20190501

## 2021-02-11 NOTE — Progress Notes (Signed)
[facility] Summary Report / Loop Recorder 

## 2021-02-14 DIAGNOSIS — Z23 Encounter for immunization: Secondary | ICD-10-CM | POA: Diagnosis not present

## 2021-02-17 DIAGNOSIS — E669 Obesity, unspecified: Secondary | ICD-10-CM | POA: Diagnosis not present

## 2021-02-17 DIAGNOSIS — Z7952 Long term (current) use of systemic steroids: Secondary | ICD-10-CM | POA: Diagnosis not present

## 2021-02-17 DIAGNOSIS — E039 Hypothyroidism, unspecified: Secondary | ICD-10-CM | POA: Diagnosis not present

## 2021-02-17 DIAGNOSIS — Z6830 Body mass index (BMI) 30.0-30.9, adult: Secondary | ICD-10-CM | POA: Diagnosis not present

## 2021-02-17 DIAGNOSIS — M8580 Other specified disorders of bone density and structure, unspecified site: Secondary | ICD-10-CM | POA: Diagnosis not present

## 2021-02-17 DIAGNOSIS — M353 Polymyalgia rheumatica: Secondary | ICD-10-CM | POA: Diagnosis not present

## 2021-02-17 DIAGNOSIS — M316 Other giant cell arteritis: Secondary | ICD-10-CM | POA: Diagnosis not present

## 2021-02-24 ENCOUNTER — Telehealth: Payer: Self-pay

## 2021-02-24 NOTE — Telephone Encounter (Signed)
Called and spoke with patient.  She prefers to continue seeing Dr. Tamala Julian as scheduled.  Patient advised if she has more frequent or longer lasting episodes of AF to call the AFib Clinics. Pt voiced understanding and was provided the phone for AF Clinic.

## 2021-02-24 NOTE — Telephone Encounter (Signed)
Carelink remote received. ILR @ [facility] 02/21/21. Patient called and advised ILR @ [facility]. Discuss options to explant or leave in. Would like to have device explanted. Patient is also overdue with AF clinic. Message sent to [facility] at AF clinic to advise. Advised patient scheduler will call to set up apt. W/ Dr. Rayann Heman for explant. Address on file verified for return kit. Appreciative of call.

## 2021-03-12 DIAGNOSIS — E785 Hyperlipidemia, unspecified: Secondary | ICD-10-CM | POA: Diagnosis not present

## 2021-03-12 DIAGNOSIS — I480 Paroxysmal atrial fibrillation: Secondary | ICD-10-CM | POA: Diagnosis not present

## 2021-03-12 DIAGNOSIS — K219 Gastro-esophageal reflux disease without esophagitis: Secondary | ICD-10-CM | POA: Diagnosis not present

## 2021-03-12 DIAGNOSIS — I10 Essential (primary) hypertension: Secondary | ICD-10-CM | POA: Diagnosis not present

## 2021-04-01 DIAGNOSIS — M8580 Other specified disorders of bone density and structure, unspecified site: Secondary | ICD-10-CM | POA: Diagnosis not present

## 2021-04-01 DIAGNOSIS — I2510 Atherosclerotic heart disease of native coronary artery without angina pectoris: Secondary | ICD-10-CM | POA: Diagnosis not present

## 2021-04-01 DIAGNOSIS — M353 Polymyalgia rheumatica: Secondary | ICD-10-CM | POA: Diagnosis not present

## 2021-04-01 DIAGNOSIS — R011 Cardiac murmur, unspecified: Secondary | ICD-10-CM | POA: Diagnosis not present

## 2021-04-01 DIAGNOSIS — K589 Irritable bowel syndrome without diarrhea: Secondary | ICD-10-CM | POA: Diagnosis not present

## 2021-04-01 DIAGNOSIS — I350 Nonrheumatic aortic (valve) stenosis: Secondary | ICD-10-CM | POA: Diagnosis not present

## 2021-04-01 DIAGNOSIS — R7301 Impaired fasting glucose: Secondary | ICD-10-CM | POA: Diagnosis not present

## 2021-04-01 DIAGNOSIS — I10 Essential (primary) hypertension: Secondary | ICD-10-CM | POA: Diagnosis not present

## 2021-04-01 DIAGNOSIS — I480 Paroxysmal atrial fibrillation: Secondary | ICD-10-CM | POA: Diagnosis not present

## 2021-04-01 DIAGNOSIS — E039 Hypothyroidism, unspecified: Secondary | ICD-10-CM | POA: Diagnosis not present

## 2021-04-01 DIAGNOSIS — D649 Anemia, unspecified: Secondary | ICD-10-CM | POA: Diagnosis not present

## 2021-04-01 DIAGNOSIS — M1990 Unspecified osteoarthritis, unspecified site: Secondary | ICD-10-CM | POA: Diagnosis not present

## 2021-04-29 ENCOUNTER — Other Ambulatory Visit (HOSPITAL_COMMUNITY): Payer: Self-pay | Admitting: *Deleted

## 2021-04-30 ENCOUNTER — Other Ambulatory Visit: Payer: Self-pay

## 2021-04-30 ENCOUNTER — Ambulatory Visit (HOSPITAL_COMMUNITY)
Admission: RE | Admit: 2021-04-30 | Discharge: 2021-04-30 | Disposition: A | Discharge status: Home / Self Care | Payer: Medicare Other | Source: Ambulatory Visit | Attending: Internal Medicine | Admitting: Internal Medicine

## 2021-04-30 DIAGNOSIS — M810 Age-related osteoporosis without current pathological fracture: Secondary | ICD-10-CM | POA: Insufficient documentation

## 2021-04-30 MED ORDER — DENOSUMAB 60 MG/ML ~~LOC~~ SOSY
PREFILLED_SYRINGE | SUBCUTANEOUS | Status: AC
  Administered 2021-04-30: 60 mg via SUBCUTANEOUS
  Filled 2021-04-30: qty 1

## 2021-04-30 MED ORDER — DENOSUMAB 60 MG/ML ~~LOC~~ SOSY: 60.0000 mg | PREFILLED_SYRINGE | Freq: Once | SUBCUTANEOUS | Status: AC

## 2021-05-13 DIAGNOSIS — Z85828 Personal history of other malignant neoplasm of skin: Secondary | ICD-10-CM | POA: Diagnosis not present

## 2021-05-13 DIAGNOSIS — D485 Neoplasm of uncertain behavior of skin: Secondary | ICD-10-CM | POA: Diagnosis not present

## 2021-05-13 DIAGNOSIS — D225 Melanocytic nevi of trunk: Secondary | ICD-10-CM | POA: Diagnosis not present

## 2021-05-13 DIAGNOSIS — L821 Other seborrheic keratosis: Secondary | ICD-10-CM | POA: Diagnosis not present

## 2021-05-13 DIAGNOSIS — L814 Other melanin hyperpigmentation: Secondary | ICD-10-CM | POA: Diagnosis not present

## 2021-05-27 ENCOUNTER — Telehealth: Payer: Self-pay | Admitting: Interventional Cardiology

## 2021-05-27 NOTE — Telephone Encounter (Signed)
Lm to call back ./cy 

## 2021-05-27 NOTE — Telephone Encounter (Signed)
°  Patient is scheduled Friday with [facility] to have her monitor removed. She would like to know if she needs to hold her Eliquis

## 2021-05-27 NOTE — Telephone Encounter (Signed)
Discussed with Dr Jackalyn Lombard nurse and there is no special instructions Pt may eat drink and take all meds as normally would. Prior to procedure/cy

## 2021-05-28 NOTE — Telephone Encounter (Signed)
Called and spoke to patient. Relayed advice that she should NOT stop taking her Eliquis or do anything differently prior to procedure. Pt agrees and understands.

## 2021-05-30 ENCOUNTER — Other Ambulatory Visit: Payer: Self-pay

## 2021-05-30 ENCOUNTER — Ambulatory Visit (INDEPENDENT_AMBULATORY_CARE_PROVIDER_SITE_OTHER): Payer: Medicare Other | Admitting: Internal Medicine

## 2021-05-30 ENCOUNTER — Encounter: Payer: Self-pay | Admitting: Internal Medicine

## 2021-05-30 VITALS — BP 138/84 | HR 57 | Ht 65.5 in | Wt 187.2 lb

## 2021-05-30 DIAGNOSIS — D6869 Other thrombophilia: Secondary | ICD-10-CM | POA: Diagnosis not present

## 2021-05-30 DIAGNOSIS — I10 Essential (primary) hypertension: Secondary | ICD-10-CM | POA: Diagnosis not present

## 2021-05-30 DIAGNOSIS — I480 Paroxysmal atrial fibrillation: Secondary | ICD-10-CM

## 2021-05-30 HISTORY — PX: OTHER SURGICAL HISTORY: SHX169

## 2021-05-30 NOTE — Patient Instructions (Addendum)
Medication Instructions:  Your physician recommends that you continue on your current medications as directed. Please refer to the Current Medication list given to you today.  Labwork: None ordered.  Testing/Procedures: None ordered.  Follow-Up:  Your physician wants you to follow-up in: as needed  Implantable Loop Recorder Removal, Care After This sheet gives you information about how to care for yourself after your procedure. Your health care provider may also give you more specific instructions. If you have problems or questions, contact your health care provider. What can I expect after the procedure? After the procedure, it is common to have: Soreness or discomfort near the incision. Some swelling or bruising near the incision.  Follow these instructions at home: Incision care   Leave your outer dressing on for 24 hours.  After 24 hours you can remove your outer dressing and shower. Leave adhesive strips in place. These skin closures may need to stay in place for 1-2 weeks. If adhesive strip edges start to loosen and curl up, you may trim the loose edges.  You may remove the strips if they have not fallen off after 2 weeks. Check your incision area every day for signs of infection. Check for: Redness, swelling, or pain. Fluid or blood. Warmth. Pus or a bad smell. Do not take baths, swim, or use a hot tub until your incision is completely healed. If your wound site starts to bleed apply pressure.      If you have any questions/concerns please call the device clinic at [phone].  Activity  Return to your normal activities.  Contact a health care provider if: You have redness, swelling, or pain around your incision. You have a fever.

## 2021-05-30 NOTE — Progress Notes (Signed)
PCP: Crist Infante, MD Primary Cardiologist: Dr Tamala Julian Primary EP: Dr Rayann Heman  [name] is a 81 y.o. female who presents today for routine electrophysiology followup.  Since last being seen in our clinic, the patient reports doing very well.  She is active for her age.  She has occasional SOB.  Today, she denies symptoms of palpitations, chest pain,   lower extremity edema, dizziness, presyncope, or syncope.  The patient is otherwise without complaint today.   Past Medical History:  Diagnosis Date   Aortic stenosis, moderate    Arthritis    osteoarthritis   Atrial fibrillation (HCC)    Dysplasia of cervix, low grade (CIN 1) 1990   HPV, Cryo, Laser   Fibroadenoma    Left, at 5 o'clock, not excised    Fibroid 2002   1 cm, 2 cm   GERD (gastroesophageal reflux disease)    Hypercholesteremia    Hypertension    Hyperthyroidism    Past Surgical History:  Procedure Laterality Date   BREAST BIOPSY Bilateral 1970's   x2 one a side   CATARACT EXTRACTION     bilateral   LOOP RECORDER INSERTION N/A 08/11/2017   Procedure: LOOP RECORDER INSERTION;  Surgeon: Thompson Grayer, MD;  Location: [facility];  Service: Cardiovascular;  Laterality: N/A;   RETINAL DETACHMENT SURGERY  2000   THYROIDECTOMY     TONSILLECTOMY     TOTAL KNEE ARTHROPLASTY  04/25/2012   Procedure: TOTAL KNEE BILATERAL;  Surgeon: Gearlean Alf, MD;  Location: [facility];  Service: Orthopedics;  Laterality: Bilateral;    ROS- all systems are reviewed and negatives except as per HPI above  Current Outpatient Medications  Medication Sig Dispense Refill   amoxicillin (AMOXIL) 500 MG capsule TAKE 4 TABLETS 30 60 MINUTES PRIOR TO DENTAL WORK     apixaban (ELIQUIS) 5 MG TABS tablet Take 1 tablet (5 mg total) by mouth 2 (two) times daily. 60 tablet 11   B-D TB SYRINGE 1CC/27GX1/2" 27G X 1/2" 1 ML MISC Inject into the skin once a week.     CALCIUM PO Take 1 tablet by mouth daily.      Cholecalciferol (VITAMIN D) 50 MCG  (2000 UT) tablet Take 2,000 Units by mouth daily.     Cyanocobalamin (B-12 PO) Take 1 tablet by mouth daily.     levothyroxine (SYNTHROID, LEVOTHROID) 88 MCG tablet Take 88 mcg by mouth daily before breakfast.     metoprolol succinate (TOPROL-XL) 25 MG 24 hr tablet Take 25 mg by mouth daily.     metoprolol tartrate (LOPRESSOR) 25 MG tablet Take 1 tablet (25 mg total) by mouth every 6 (six) hours as needed (heart racing). 45 tablet 1   mometasone (ELOCON) 0.1 % ointment Apply topically to skin once weekly 45 g 1   nitroGLYCERIN (NITROSTAT) 0.4 MG SL tablet Place 1 tablet (0.4 mg total) under the tongue every 5 (five) minutes as needed for chest pain. 25 tablet 3   pantoprazole (PROTONIX) 40 MG tablet Take 40 mg by mouth daily.     predniSONE (DELTASONE) 10 MG tablet Take 9 mg by mouth daily with breakfast.      REPATHA SURECLICK 542 MG/ML SOAJ [id number] Milligram(s) SUB-Q Every 2 Weeks     telmisartan (MICARDIS) 20 MG tablet SMARTSIG:1 Tablet(s) By Mouth Every Evening     zolpidem (AMBIEN) 5 MG tablet Take 2.5 mg by mouth at bedtime as needed for sleep.      No current facility-administered  medications for this visit.    Physical Exam: Vitals:   05/30/21 0947  BP: 138/84  Pulse: (!) 57  SpO2: 96%  Weight: 187 lb 3.2 oz (84.9 kg)  Height: 5' 5.5" (1.664 m)    GEN- The patient is well appearing, alert and oriented x 3 today.   Head- normocephalic, atraumatic Eyes-  Sclera clear, conjunctiva pink Ears- hearing intact Oropharynx- clear Lungs- Clear to ausculation bilaterally, normal work of breathing Heart- Regular rate and rhythm, 3/6 SEM LUSB which is late peaking GI- soft, NT, ND, + BS Extremities- no clubbing, cyanosis, or edema  Wt Readings from Last 3 Encounters:  05/30/21 187 lb 3.2 oz (84.9 kg)  12/13/20 181 lb 3.2 oz (82.2 kg)  07/12/20 183 lb (83 kg)    EKG tracing ordered today is personally reviewed and shows sinus  Assessment and Plan:  Paroxysmal atrial  fibrillation Detected on ILR Burden is 0.4 % Device is at ERT.  She wishes to have this removed.  Risks, benefits to ILR removal were discussed with the patient who wishes to proceed.  She is aware that risks include but are not limited to bleeding and infection.  2. HTN Stable No change required today  3. Aortic stenosis At least moderate if not severe by echo Repeat echo scheduled for March with close follow-up by Dr Charisse March MD, [facility] 05/30/2021 10:02 AM  SURGEON:  Thompson Grayer, MD     PROCEDURES:   1. Implantable loop recorder explantation        DESCRIPTION OF PROCEDURE:  Informed written consent was obtained.  The patient required no sedation for the procedure today.   The patients left chest was therefore prepped and draped in the usual sterile fashion.  The skin overlying the ILR monitor was infiltrated with lidocaine for local analgesia.  A 0.5-cm incision was made over the site.  The previously implanted ILR was exposed and removed using a combination of sharp and blunt dissection.  Steri- Strips and a sterile dressing were then applied. EBL<37ml.  There were no early apparent complications.     CONCLUSIONS:   1. Successful explantation of a [employer] Reveal LINQ implantable loop recorder   2. No early apparent complications.        Thompson Grayer MD, [facility] 05/30/2021 10:13 AM

## 2021-06-06 DIAGNOSIS — Z7952 Long term (current) use of systemic steroids: Secondary | ICD-10-CM | POA: Diagnosis not present

## 2021-06-06 DIAGNOSIS — E669 Obesity, unspecified: Secondary | ICD-10-CM | POA: Diagnosis not present

## 2021-06-06 DIAGNOSIS — Z6831 Body mass index (BMI) 31.0-31.9, adult: Secondary | ICD-10-CM | POA: Diagnosis not present

## 2021-06-06 DIAGNOSIS — M8580 Other specified disorders of bone density and structure, unspecified site: Secondary | ICD-10-CM | POA: Diagnosis not present

## 2021-06-06 DIAGNOSIS — M353 Polymyalgia rheumatica: Secondary | ICD-10-CM | POA: Diagnosis not present

## 2021-06-06 DIAGNOSIS — M316 Other giant cell arteritis: Secondary | ICD-10-CM | POA: Diagnosis not present

## 2021-06-06 DIAGNOSIS — E039 Hypothyroidism, unspecified: Secondary | ICD-10-CM | POA: Diagnosis not present

## 2021-06-09 ENCOUNTER — Ambulatory Visit (HOSPITAL_COMMUNITY): Payer: Medicare Other | Attending: Cardiology

## 2021-06-09 ENCOUNTER — Other Ambulatory Visit: Payer: Self-pay

## 2021-06-09 DIAGNOSIS — I350 Nonrheumatic aortic (valve) stenosis: Secondary | ICD-10-CM | POA: Diagnosis not present

## 2021-06-09 LAB — ECHOCARDIOGRAM COMPLETE
AR max vel: 0.85 cm2
AV Area VTI: 0.85 cm2
AV Area mean vel: 0.76 cm2
AV Mean grad: 40 mmHg
AV Peak grad: 63 mmHg
Ao pk vel: 3.97 m/s
Area-P 1/2: 3.05 cm2
P 1/2 time: 738 msec
S' Lateral: 3.8 cm

## 2021-06-11 NOTE — Progress Notes (Signed)
Cardiology Office Note:    Date:  06/13/2021   ID:  Marissa Powers, DOB Aug 19, 1940, MRN [id number]  PCP:  Crist Infante, MD  Cardiologist:  Sinclair Grooms, MD   Referring MD: Crist Infante, MD   Chief Complaint  Patient presents with   Cardiac Valve Problem    Severe aortic stenosis   Coronary Artery Disease   Shortness of Breath    History of Present Illness:    Marissa Powers is a 81 y.o. 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  Progressive dyspnea and fatigue on exertion.  The metric is her dog walk.  She has become less and less able to walk the dog without feeling short of breath and have to take breaks.  Walking up any incline causes significant shortness of breath.  She denies dyspnea/shortness of breath at rest and there is no orthopnea.  She has not had angina or syncope.  She has rare episodes of atrial fibrillation.  No bleeding on Eliquis.  Past Medical History:  Diagnosis Date   Aortic stenosis, moderate    Arthritis    osteoarthritis   Atrial fibrillation (HCC)    Dysplasia of cervix, low grade (CIN 1) 1990   HPV, Cryo, Laser   Fibroadenoma    Left, at 5 o'clock, not excised    Fibroid 2002   1 cm, 2 cm   GERD (gastroesophageal reflux disease)    Hypercholesteremia    Hypertension    Hyperthyroidism     Past Surgical History:  Procedure Laterality Date   BREAST BIOPSY Bilateral 1970's   x2 one a side   CATARACT EXTRACTION     bilateral   implantable loop recorder removal  05/30/2021   MDT reveal LINQ removed by Dr Rayann Heman   LOOP RECORDER INSERTION N/A 08/11/2017   Procedure: LOOP RECORDER INSERTION;  Surgeon: Thompson Grayer, MD;  Location: [facility];  Service: Cardiovascular;  Laterality: N/A;   RETINAL DETACHMENT SURGERY  2000   THYROIDECTOMY      TONSILLECTOMY     TOTAL KNEE ARTHROPLASTY  04/25/2012   Procedure: TOTAL KNEE BILATERAL;  Surgeon: Gearlean Alf, MD;  Location: [facility];  Service: Orthopedics;  Laterality: Bilateral;    Current Medications: Current Meds  Medication Sig   amoxicillin (AMOXIL) 500 MG capsule TAKE 4 TABLETS 30 60 MINUTES PRIOR TO DENTAL WORK   apixaban (ELIQUIS) 5 MG TABS tablet Take 1 tablet (5 mg total) by mouth 2 (two) times daily.   B-D TB SYRINGE 1CC/27GX1/2" 27G X 1/2" 1 ML MISC Inject into the skin once a week.   CALCIUM PO Take 1 tablet by mouth daily.    Cholecalciferol (VITAMIN D) 50 MCG (2000 UT) tablet Take 2,000 Units by mouth daily.   Cyanocobalamin (B-12 PO) Take 1 tablet by mouth daily.   dicyclomine (BENTYL) 10 MG capsule Take 10 mg by mouth as needed.   levothyroxine (SYNTHROID, LEVOTHROID) 88 MCG tablet Take 88 mcg by mouth daily before breakfast.   Methotrexate Sodium (METHOTREXATE, PF,) 50 MG/2ML injection Inject 50 mg into the muscle once a week.   metoprolol succinate (TOPROL-XL) 25 MG 24 hr tablet Take 25 mg by mouth daily.   mometasone (ELOCON) 0.1 % ointment Apply topically to skin once weekly   nitroGLYCERIN (NITROSTAT) 0.4 MG SL tablet Place 1 tablet (0.4  mg total) under the tongue every 5 (five) minutes as needed for chest pain.   pantoprazole (PROTONIX) 40 MG tablet Take 40 mg by mouth daily.   predniSONE (DELTASONE) 10 MG tablet Take 9 mg by mouth daily with breakfast.    REPATHA SURECLICK 631 MG/ML SOAJ [id number] Milligram(s) SUB-Q Every 2 Weeks   telmisartan (MICARDIS) 20 MG tablet SMARTSIG:1 Tablet(s) By Mouth Every Evening   zolpidem (AMBIEN) 5 MG tablet Take 2.5 mg by mouth at bedtime as needed for sleep.      Allergies:   Cashew nut oil   Social History   Socioeconomic History   Marital status: Widowed    Spouse name: Not on file   Number of children: Not on file   Years of education: Not on file   Highest education level: Not on file  Occupational History    Not on file  Tobacco Use   Smoking status: Never    Passive exposure: Never   Smokeless tobacco: Never  Substance and Sexual Activity   Alcohol use: Yes    Alcohol/week: 4.0 - 6.0 standard drinks    Types: 4 - 6 Standard drinks or equivalent per week   Drug use: No   Sexual activity: Not Currently    Partners: Male    Birth control/protection: Post-menopausal  Other Topics Concern   Not on file  Social History Narrative   Not on file   Social Determinants of Health   Financial Resource Strain: Not on file  Food Insecurity: Not on file  Transportation Needs: Not on file  Physical Activity: Not on file  Stress: Not on file  Social Connections: Not on file     Family History: The patient's family history includes Aortic aneurysm in her mother; Breast cancer in her maternal grandmother; Breast cancer (age of onset: 70) in her maternal aunt; Clotting disorder in her mother; Healthy in her father; Heart attack in her maternal grandmother; Heart disease in her brother.  ROS:   Please see the history of present illness.    Her loop recorder has been explanted.  All other systems reviewed and are negative.  EKGs/Labs/Other Studies Reviewed:    The following studies were reviewed today:   CORONARY CTA / FFR 2019: IMPRESSION: 1. Coronary calcium score of 90. This was 2 percentile for age and sex matched control.   2. Normal coronary origin with right dominance.   3. Moderate stenosis in the proximal LAD. Additional analysis with CT FFR will be submitted.   1. Left Main:  No significant stenosis.   2. LAD: Proximal: 0.95, distal: 0.81. 3. LCX: No significant stenosis. 4. RCA: No significant stenosis.   CT FFR IMPRESSION: 1. CT FFR analysis showed borderline lesion in the proximal LAD, given that it is proximal LAD a cardiac catheterization should be considered.  2 D Doppler ECHOCARDIOGRAM 06/09/2021: IMPRESSIONS     1. Severe AS (mean gradient 40 mmHg, peak  velocity 4 m/s).   2. Left ventricular ejection fraction, by estimation, is 60 to 65%. The  left ventricle has normal function. The left ventricle has no regional  wall motion abnormalities. Left ventricular diastolic parameters are  consistent with Grade II diastolic  dysfunction (pseudonormalization).   3. Right ventricular systolic function is normal. The right ventricular  size is normal. Tricuspid regurgitation signal is inadequate for assessing  PA pressure.   4. Left atrial size was mild to moderately dilated.   5. The mitral valve is normal in structure. Mild mitral  valve  regurgitation. No evidence of mitral stenosis.   6. The aortic valve is calcified. Aortic valve regurgitation is mild.  Severe aortic valve stenosis.   7. The inferior vena cava is normal in size with greater than 50%  respiratory variability, suggesting right atrial pressure of 3 mmHg.    EKG:  EKG sinus bradycardia, relatively low voltage, with QS pattern V1 and V2.  EKG performed May 30, 2021.  Recent Labs: No results found for requested labs within last 8760 hours.  Recent Lipid Panel    Component Value Date/Time   CHOL 122 10/28/2018 0832   TRIG 108 10/28/2018 0832   HDL 55 10/28/2018 0832   CHOLHDL 2.2 10/28/2018 0832   LDLCALC 45 10/28/2018 0832    Physical Exam:    VS:  BP (!) 128/92    Pulse 61    Ht 5' 5.5" (1.664 m)    Wt 186 lb 12.8 oz (84.7 kg)    SpO2 96%    BMI 30.61 kg/m     Wt Readings from Last 3 Encounters:  06/13/21 186 lb 12.8 oz (84.7 kg)  05/30/21 187 lb 3.2 oz (84.9 kg)  12/13/20 181 lb 3.2 oz (82.2 kg)     GEN: Slightly overweight.  Appearance is compatible with age.. No acute distress HEENT: Normal NECK: No JVD. LYMPHATICS: No lymphadenopathy CARDIAC: 4/6 crescendo decrescendo diamond-shaped aortic stenosis murmur at right upper sternal border with radiation into carotids bilaterally.. RRR S4 gallop but no S3 or edema. VASCULAR:  Normal Pulses. No  bruits. RESPIRATORY:  Clear to auscultation without rales, wheezing or rhonchi  ABDOMEN: Soft, non-tender, non-distended, No pulsatile mass, MUSCULOSKELETAL: No deformity  SKIN: Warm and dry NEUROLOGIC:  Alert and oriented x 3 PSYCHIATRIC:  Normal affect   ASSESSMENT:    1. Nonrheumatic aortic valve stenosis   2. Coronary artery disease of native artery of native heart with stable angina pectoris ([place])   3. Acquired thrombophilia (HCC)   4. Paroxysmal atrial fibrillation ([place])   5. Primary hypertension   6. Hypercholesteremia    PLAN:    In order of problems listed above:  Severe aortic stenosis.  See echo above.  Symptomatic.  Discussed management strategy which will be left and right heart cath with coronary angiography followed by referral to the aortic valve clinic to consider SAVR versus TAVR.  Highly unlikely to require SAVR.  Risk of stroke, pacemaker, bleeding, discussed in detail.  She is excited about moving forward because she feels that her quality of life is suffering from exertional fatigue and dyspnea.  Cardiac cath and risks were discussed including bleeding, stroke, death, myocardial infarction.  We will plan radial/brachial approach. Continue risk mitigation with Repatha. She is on Eliquis for paroxysmal atrial fibrillation.  No complications.   A-fib burden is relatively low.  Most recent report was October 2022.  The device was explanted because of depleted battery. Blood pressure has been suppressed by severe aortic stenosis.  This will be managed as required after procedure for valve stenosis. Continue Repatha.  Continue LDL target less than 70.  The patient was counseled to undergo left heart catheterization, coronary angiography, and possible percutaneous coronary intervention with stent implantation. The procedural risks and benefits were discussed in detail. The risks discussed included death, stroke, myocardial infarction, life-threatening bleeding, limb  ischemia, kidney injury, allergy, and possible emergency cardiac surgery. The risk of these significant complications were estimated to occur less than 1% of the time. After discussion, the patient has agreed  to proceed.   Natural history of aortic valve stenosis was discussed in detail.  Cardinal symptoms of angina, syncope, and dyspnea were reviewed and significance relative to prognosis was described.  The importance of sequential imaging for disease monitoring was emphasized.  Work-up including possible heart catheterization and CT angiography were described as essential components of staging for therapy.  Treatment options, TAVR and SAVR, were discussed in some detail with emphasis on TAVR.    Medication Adjustments/Labs and Tests Ordered: Current medicines are reviewed at length with the patient today.  Concerns regarding medicines are outlined above.  No orders of the defined types were placed in this encounter.  No orders of the defined types were placed in this encounter.   There are no Patient Instructions on file for this visit.   Signed, Sinclair Grooms, MD  06/13/2021 10:38 AM    [place]

## 2021-06-11 NOTE — H&P (View-Only) (Signed)
Cardiology Office Note:    Date:  06/13/2021   ID:  Marissa Powers, DOB 1940-10-28, MRN [id number]  PCP:  Crist Infante, MD  Cardiologist:  Sinclair Grooms, MD   Referring MD: Crist Infante, MD   Chief Complaint  Patient presents with   Cardiac Valve Problem    Severe aortic stenosis   Coronary Artery Disease   Shortness of Breath    History of Present Illness:    Marissa Powers is a 81 y.o. 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  Progressive dyspnea and fatigue on exertion.  The metric is her dog walk.  She has become less and less able to walk the dog without feeling short of breath and have to take breaks.  Walking up any incline causes significant shortness of breath.  She denies dyspnea/shortness of breath at rest and there is no orthopnea.  She has not had angina or syncope.  She has rare episodes of atrial fibrillation.  No bleeding on Eliquis.  Past Medical History:  Diagnosis Date   Aortic stenosis, moderate    Arthritis    osteoarthritis   Atrial fibrillation (HCC)    Dysplasia of cervix, low grade (CIN 1) 1990   HPV, Cryo, Laser   Fibroadenoma    Left, at 5 o'clock, not excised    Fibroid 2002   1 cm, 2 cm   GERD (gastroesophageal reflux disease)    Hypercholesteremia    Hypertension    Hyperthyroidism     Past Surgical History:  Procedure Laterality Date   BREAST BIOPSY Bilateral 1970's   x2 one a side   CATARACT EXTRACTION     bilateral   implantable loop recorder removal  05/30/2021   MDT reveal LINQ removed by Dr Rayann Heman   LOOP RECORDER INSERTION N/A 08/11/2017   Procedure: LOOP RECORDER INSERTION;  Surgeon: Thompson Grayer, MD;  Location: [facility];  Service: Cardiovascular;  Laterality: N/A;   RETINAL DETACHMENT SURGERY  2000   THYROIDECTOMY      TONSILLECTOMY     TOTAL KNEE ARTHROPLASTY  04/25/2012   Procedure: TOTAL KNEE BILATERAL;  Surgeon: Gearlean Alf, MD;  Location: [facility];  Service: Orthopedics;  Laterality: Bilateral;    Current Medications: Current Meds  Medication Sig   amoxicillin (AMOXIL) 500 MG capsule TAKE 4 TABLETS 30 60 MINUTES PRIOR TO DENTAL WORK   apixaban (ELIQUIS) 5 MG TABS tablet Take 1 tablet (5 mg total) by mouth 2 (two) times daily.   B-D TB SYRINGE 1CC/27GX1/2" 27G X 1/2" 1 ML MISC Inject into the skin once a week.   CALCIUM PO Take 1 tablet by mouth daily.    Cholecalciferol (VITAMIN D) 50 MCG (2000 UT) tablet Take 2,000 Units by mouth daily.   Cyanocobalamin (B-12 PO) Take 1 tablet by mouth daily.   dicyclomine (BENTYL) 10 MG capsule Take 10 mg by mouth as needed.   levothyroxine (SYNTHROID, LEVOTHROID) 88 MCG tablet Take 88 mcg by mouth daily before breakfast.   Methotrexate Sodium (METHOTREXATE, PF,) 50 MG/2ML injection Inject 50 mg into the muscle once a week.   metoprolol succinate (TOPROL-XL) 25 MG 24 hr tablet Take 25 mg by mouth daily.   mometasone (ELOCON) 0.1 % ointment Apply topically to skin once weekly   nitroGLYCERIN (NITROSTAT) 0.4 MG SL tablet Place 1 tablet (0.4  mg total) under the tongue every 5 (five) minutes as needed for chest pain.   pantoprazole (PROTONIX) 40 MG tablet Take 40 mg by mouth daily.   predniSONE (DELTASONE) 10 MG tablet Take 9 mg by mouth daily with breakfast.    REPATHA SURECLICK 376 MG/ML SOAJ [id number]:160 Milligram(s) SUB-Q Every 2 Weeks   telmisartan (MICARDIS) 20 MG tablet SMARTSIG:1 Tablet(s) By Mouth Every Evening   zolpidem (AMBIEN) 5 MG tablet Take 2.5 mg by mouth at bedtime as needed for sleep.      Allergies:   Cashew nut oil   Social History   Socioeconomic History   Marital status: Widowed    Spouse name: Not on file   Number of children: Not on file   Years of education: Not on file   Highest education level: Not on file  Occupational History    Not on file  Tobacco Use   Smoking status: Never    Passive exposure: Never   Smokeless tobacco: Never  Substance and Sexual Activity   Alcohol use: Yes    Alcohol/week: 4.0 - 6.0 standard drinks    Types: 4 - 6 Standard drinks or equivalent per week   Drug use: No   Sexual activity: Not Currently    Partners: Male    Birth control/protection: Post-menopausal  Other Topics Concern   Not on file  Social History Narrative   Not on file   Social Determinants of Health   Financial Resource Strain: Not on file  Food Insecurity: Not on file  Transportation Needs: Not on file  Physical Activity: Not on file  Stress: Not on file  Social Connections: Not on file     Family History: The patient's family history includes Aortic aneurysm in her mother; Breast cancer in her maternal grandmother; Breast cancer (age of onset: 56) in her maternal aunt; Clotting disorder in her mother; Healthy in her father; Heart attack in her maternal grandmother; Heart disease in her brother.  ROS:   Please see the history of present illness.    Her loop recorder has been explanted.  All other systems reviewed and are negative.  EKGs/Labs/Other Studies Reviewed:    The following studies were reviewed today:   CORONARY CTA / FFR 2019: IMPRESSION: 1. Coronary calcium score of 90. This was 58 percentile for age and sex matched control.   2. Normal coronary origin with right dominance.   3. Moderate stenosis in the proximal LAD. Additional analysis with CT FFR will be submitted.   1. Left Main:  No significant stenosis.   2. LAD: Proximal: 0.95, distal: 0.81. 3. LCX: No significant stenosis. 4. RCA: No significant stenosis.   CT FFR IMPRESSION: 1. CT FFR analysis showed borderline lesion in the proximal LAD, given that it is proximal LAD a cardiac catheterization should be considered.  2 D Doppler ECHOCARDIOGRAM 06/09/2021: IMPRESSIONS     1. Severe AS (mean gradient 40 mmHg, peak  velocity 4 m/s).   2. Left ventricular ejection fraction, by estimation, is 60 to 65%. The  left ventricle has normal function. The left ventricle has no regional  wall motion abnormalities. Left ventricular diastolic parameters are  consistent with Grade II diastolic  dysfunction (pseudonormalization).   3. Right ventricular systolic function is normal. The right ventricular  size is normal. Tricuspid regurgitation signal is inadequate for assessing  PA pressure.   4. Left atrial size was mild to moderately dilated.   5. The mitral valve is normal in structure. Mild mitral  valve  regurgitation. No evidence of mitral stenosis.   6. The aortic valve is calcified. Aortic valve regurgitation is mild.  Severe aortic valve stenosis.   7. The inferior vena cava is normal in size with greater than 50%  respiratory variability, suggesting right atrial pressure of 3 mmHg.    EKG:  EKG sinus bradycardia, relatively low voltage, with QS pattern V1 and V2.  EKG performed May 30, 2021.  Recent Labs: No results found for requested labs within last 8760 hours.  Recent Lipid Panel    Component Value Date/Time   CHOL 122 10/28/2018 0832   TRIG 108 10/28/2018 0832   HDL 55 10/28/2018 0832   CHOLHDL 2.2 10/28/2018 0832   LDLCALC 45 10/28/2018 0832    Physical Exam:    VS:  BP (!) 128/92    Pulse 61    Ht 5' 5.5" (1.664 m)    Wt 186 lb 12.8 oz (84.7 kg)    SpO2 96%    BMI 30.61 kg/m     Wt Readings from Last 3 Encounters:  06/13/21 186 lb 12.8 oz (84.7 kg)  05/30/21 187 lb 3.2 oz (84.9 kg)  12/13/20 181 lb 3.2 oz (82.2 kg)     GEN: Slightly overweight.  Appearance is compatible with age.. No acute distress HEENT: Normal NECK: No JVD. LYMPHATICS: No lymphadenopathy CARDIAC: 4/6 crescendo decrescendo diamond-shaped aortic stenosis murmur at right upper sternal border with radiation into carotids bilaterally.. RRR S4 gallop but no S3 or edema. VASCULAR:  Normal Pulses. No  bruits. RESPIRATORY:  Clear to auscultation without rales, wheezing or rhonchi  ABDOMEN: Soft, non-tender, non-distended, No pulsatile mass, MUSCULOSKELETAL: No deformity  SKIN: Warm and dry NEUROLOGIC:  Alert and oriented x 3 PSYCHIATRIC:  Normal affect   ASSESSMENT:    1. Nonrheumatic aortic valve stenosis   2. Coronary artery disease of native artery of native heart with stable angina pectoris ([place])   3. Acquired thrombophilia (HCC)   4. Paroxysmal atrial fibrillation ([place])   5. Primary hypertension   6. Hypercholesteremia    PLAN:    In order of problems listed above:  Severe aortic stenosis.  See echo above.  Symptomatic.  Discussed management strategy which will be left and right heart cath with coronary angiography followed by referral to the aortic valve clinic to consider SAVR versus TAVR.  Highly unlikely to require SAVR.  Risk of stroke, pacemaker, bleeding, discussed in detail.  She is excited about moving forward because she feels that her quality of life is suffering from exertional fatigue and dyspnea.  Cardiac cath and risks were discussed including bleeding, stroke, death, myocardial infarction.  We will plan radial/brachial approach. Continue risk mitigation with Repatha. She is on Eliquis for paroxysmal atrial fibrillation.  No complications.   A-fib burden is relatively low.  Most recent report was October 2022.  The device was explanted because of depleted battery. Blood pressure has been suppressed by severe aortic stenosis.  This will be managed as required after procedure for valve stenosis. Continue Repatha.  Continue LDL target less than 70.  The patient was counseled to undergo left heart catheterization, coronary angiography, and possible percutaneous coronary intervention with stent implantation. The procedural risks and benefits were discussed in detail. The risks discussed included death, stroke, myocardial infarction, life-threatening bleeding, limb  ischemia, kidney injury, allergy, and possible emergency cardiac surgery. The risk of these significant complications were estimated to occur less than 1% of the time. After discussion, the patient has agreed  to proceed.   Natural history of aortic valve stenosis was discussed in detail.  Cardinal symptoms of angina, syncope, and dyspnea were reviewed and significance relative to prognosis was described.  The importance of sequential imaging for disease monitoring was emphasized.  Work-up including possible heart catheterization and CT angiography were described as essential components of staging for therapy.  Treatment options, TAVR and SAVR, were discussed in some detail with emphasis on TAVR.    Medication Adjustments/Labs and Tests Ordered: Current medicines are reviewed at length with the patient today.  Concerns regarding medicines are outlined above.  No orders of the defined types were placed in this encounter.  No orders of the defined types were placed in this encounter.   There are no Patient Instructions on file for this visit.   Signed, Sinclair Grooms, MD  06/13/2021 10:38 AM    [place]

## 2021-06-13 ENCOUNTER — Ambulatory Visit (INDEPENDENT_AMBULATORY_CARE_PROVIDER_SITE_OTHER): Payer: Medicare Other | Admitting: Interventional Cardiology

## 2021-06-13 ENCOUNTER — Other Ambulatory Visit: Payer: Self-pay

## 2021-06-13 ENCOUNTER — Encounter: Payer: Self-pay | Admitting: Interventional Cardiology

## 2021-06-13 VITALS — BP 128/92 | HR 61 | Ht 65.5 in | Wt 186.8 lb

## 2021-06-13 DIAGNOSIS — I480 Paroxysmal atrial fibrillation: Secondary | ICD-10-CM

## 2021-06-13 DIAGNOSIS — I25118 Atherosclerotic heart disease of native coronary artery with other forms of angina pectoris: Secondary | ICD-10-CM | POA: Diagnosis not present

## 2021-06-13 DIAGNOSIS — I350 Nonrheumatic aortic (valve) stenosis: Secondary | ICD-10-CM | POA: Diagnosis not present

## 2021-06-13 DIAGNOSIS — E7800 Pure hypercholesterolemia, unspecified: Secondary | ICD-10-CM

## 2021-06-13 DIAGNOSIS — D6869 Other thrombophilia: Secondary | ICD-10-CM

## 2021-06-13 DIAGNOSIS — I10 Essential (primary) hypertension: Secondary | ICD-10-CM | POA: Diagnosis not present

## 2021-06-13 LAB — BASIC METABOLIC PANEL
BUN/Creatinine Ratio: 13 (ref 12–28)
BUN: 10 mg/dL (ref 8–27)
CO2: 24 mmol/L (ref 20–29)
Calcium: 9.7 mg/dL (ref 8.7–10.3)
Chloride: 105 mmol/L (ref 96–106)
Creatinine, Ser: 0.77 mg/dL (ref 0.57–1.00)
Glucose: 105 mg/dL — ABNORMAL HIGH (ref 70–99)
Potassium: 4.3 mmol/L (ref 3.5–5.2)
Sodium: 143 mmol/L (ref 134–144)
eGFR: 78 mL/min/{1.73_m2} (ref >59)

## 2021-06-13 LAB — CBC
Hematocrit: 35.3 % (ref 34.0–46.6)
Hemoglobin: 11.9 g/dL (ref 11.1–15.9)
MCH: 35.2 pg — ABNORMAL HIGH (ref 26.6–33.0)
MCHC: 33.7 g/dL (ref 31.5–35.7)
MCV: 104 fL — ABNORMAL HIGH (ref 79–97)
Platelets: 243 10*3/uL (ref 150–450)
RBC: 3.38 x10E6/uL — ABNORMAL LOW (ref 3.77–5.28)
RDW: 13.1 % (ref 11.7–15.4)
WBC: 9.2 10*3/uL (ref 3.4–10.8)

## 2021-06-13 NOTE — Patient Instructions (Signed)
Medication Instructions:  ?Your physician recommends that you continue on your current medications as directed. Please refer to the Current Medication list given to you today. ? ?*If you need a refill on your cardiac medications before your next appointment, please call your pharmacy* ? ? ?Lab Work: ?BMET and CBC today  ? ?If you have labs (blood work) drawn today and your tests are completely normal, you will receive your results only by: ?MyChart Message (if you have MyChart) OR ?A paper copy in the mail ?If you have any lab test that is abnormal or we need to change your treatment, we will call you to review the results. ? ? ?Testing/Procedures: ?Your physician has requested that you have a cardiac catheterization. Cardiac catheterization is used to diagnose and/or treat various heart conditions. Doctors may recommend this procedure for a number of different reasons. The most common reason is to evaluate chest pain. Chest pain can be a symptom of coronary artery disease (CAD), and cardiac catheterization can show whether plaque is narrowing or blocking your heart?s arteries. This procedure is also used to evaluate the valves, as well as measure the blood flow and oxygen levels in different parts of your heart. For further information please visit [email or web address]. Please follow instruction sheet, as given. ? ? ?Follow-Up: ?The structural team will be in contact with you to get you scheduled to see them after your heart cath is completed. :1}  ? ? ?Other Instructions ? ? ?[place] [facility] ?[address], [address] ?[place] [address] ?Dept: [phone] ?Loc: [phone] ? ?Marissa Powers  06/13/2021 ? ?You are scheduled for a Cardiac Catheterization on Thursday, March 9 with Dr. Daneen Schick. ? ?1. Please arrive at the Main Entrance A at [facility]: [place], [place] [address] at 5:30 AM (This time is two  hours before your procedure to ensure your preparation). Free valet parking service is available.  ? ?Special note: Every effort is made to have your procedure done on time. Please understand that emergencies sometimes delay scheduled procedures. ? ?2. Diet: Do not eat solid foods after midnight.  You may have clear liquids until 5 AM upon the day of the procedure. ? ?3. Labs: You will have labs drawn today ? ?4. Medication instructions in preparation for your procedure: ? ? Contrast Allergy: No ? ? ?Stop taking Eliquis (Apixiban) on Tuesday, March 7. ? ? ?On the morning of your procedure, take Aspirin and any morning medicines NOT listed above.  You may use sips of water. ? ?5. Plan to go home the same day, you will only stay overnight if medically necessary. ?6. You MUST have a responsible adult to drive you home. ?7. An adult MUST be with you the first 24 hours after you arrive home. ?8. Bring a current list of your medications, and the last time and date medication taken. ?9. Bring ID and current insurance cards. ?10.Please wear clothes that are easy to get on and off and wear slip-on shoes. ? ?Thank you for allowing [place] to care for you! ?  -- [facility]   ?

## 2021-06-17 ENCOUNTER — Telehealth: Payer: Self-pay | Admitting: *Deleted

## 2021-06-17 ENCOUNTER — Other Ambulatory Visit: Payer: Self-pay

## 2021-06-17 DIAGNOSIS — I350 Nonrheumatic aortic (valve) stenosis: Secondary | ICD-10-CM

## 2021-06-17 NOTE — Telephone Encounter (Signed)
Cardiac catheterization scheduled at [facility] for: Thursday June 19, 2021 7:30 AM ?[facility] Main Entrance A [facility]) at: 5:30 AM ? ? ?Diet-no solid food after midnight prior to cath, clear liquids until 5 AM day of procedure. ? ?Medication instructions for procedure: ?-Hold: ? Eliquis-none 06/15/21 until post procedure ?-Except hold medications usual morning medications can be taken pre-cath with sips of water including aspirin 81 mg. ?   ?Must have responsible adult to drive home post procedure and be with patient first 24 hours after arriving home. ? ?[facility] does allow one visitor to wait in the waiting room during the time you are there. ? ? ?  Reviewed procedure instructions with patient.  ? ? ? ? ?   ? ? ? ? ?

## 2021-06-19 ENCOUNTER — Ambulatory Visit (HOSPITAL_COMMUNITY)
Admission: RE | Admit: 2021-06-19 | Discharge: 2021-06-19 | Disposition: A | Discharge status: Home / Self Care | Payer: Medicare Other | Source: Ambulatory Visit | Attending: Interventional Cardiology | Admitting: Interventional Cardiology

## 2021-06-19 ENCOUNTER — Other Ambulatory Visit: Payer: Self-pay

## 2021-06-19 ENCOUNTER — Encounter (HOSPITAL_COMMUNITY): Payer: Self-pay | Admitting: Interventional Cardiology

## 2021-06-19 ENCOUNTER — Encounter (HOSPITAL_COMMUNITY)
Admission: RE | Disposition: A | Discharge status: Home / Self Care | Payer: Self-pay | Source: Ambulatory Visit | Attending: Interventional Cardiology

## 2021-06-19 DIAGNOSIS — K219 Gastro-esophageal reflux disease without esophagitis: Secondary | ICD-10-CM | POA: Diagnosis not present

## 2021-06-19 DIAGNOSIS — I480 Paroxysmal atrial fibrillation: Secondary | ICD-10-CM | POA: Diagnosis not present

## 2021-06-19 DIAGNOSIS — I352 Nonrheumatic aortic (valve) stenosis with insufficiency: Secondary | ICD-10-CM | POA: Diagnosis not present

## 2021-06-19 DIAGNOSIS — I10 Essential (primary) hypertension: Secondary | ICD-10-CM | POA: Insufficient documentation

## 2021-06-19 DIAGNOSIS — Z79890 Hormone replacement therapy (postmenopausal): Secondary | ICD-10-CM | POA: Insufficient documentation

## 2021-06-19 DIAGNOSIS — E0590 Thyrotoxicosis, unspecified without thyrotoxic crisis or storm: Secondary | ICD-10-CM | POA: Diagnosis not present

## 2021-06-19 DIAGNOSIS — Z7952 Long term (current) use of systemic steroids: Secondary | ICD-10-CM | POA: Insufficient documentation

## 2021-06-19 DIAGNOSIS — M353 Polymyalgia rheumatica: Secondary | ICD-10-CM | POA: Diagnosis not present

## 2021-06-19 DIAGNOSIS — I272 Pulmonary hypertension, unspecified: Secondary | ICD-10-CM | POA: Insufficient documentation

## 2021-06-19 DIAGNOSIS — E7800 Pure hypercholesterolemia, unspecified: Secondary | ICD-10-CM | POA: Insufficient documentation

## 2021-06-19 DIAGNOSIS — I2510 Atherosclerotic heart disease of native coronary artery without angina pectoris: Secondary | ICD-10-CM | POA: Insufficient documentation

## 2021-06-19 DIAGNOSIS — Z7901 Long term (current) use of anticoagulants: Secondary | ICD-10-CM | POA: Insufficient documentation

## 2021-06-19 DIAGNOSIS — I350 Nonrheumatic aortic (valve) stenosis: Secondary | ICD-10-CM | POA: Diagnosis not present

## 2021-06-19 DIAGNOSIS — I25118 Atherosclerotic heart disease of native coronary artery with other forms of angina pectoris: Secondary | ICD-10-CM | POA: Diagnosis not present

## 2021-06-19 DIAGNOSIS — Z79899 Other long term (current) drug therapy: Secondary | ICD-10-CM | POA: Diagnosis not present

## 2021-06-19 HISTORY — PX: RIGHT/LEFT HEART CATH AND CORONARY ANGIOGRAPHY: CATH118266

## 2021-06-19 LAB — POCT I-STAT EG7
Acid-base deficit: 1 mmol/L (ref 0.0–2.0)
Acid-base deficit: 1 mmol/L (ref 0.0–2.0)
Bicarbonate: 24.5 mmol/L (ref 20.0–28.0)
Bicarbonate: 24.6 mmol/L (ref 20.0–28.0)
Calcium, Ion: 1.18 mmol/L (ref 1.15–1.40)
Calcium, Ion: 1.22 mmol/L (ref 1.15–1.40)
HCT: 32 % — ABNORMAL LOW (ref 36.0–46.0)
HCT: 32 % — ABNORMAL LOW (ref 36.0–46.0)
Hemoglobin: 10.9 g/dL — ABNORMAL LOW (ref 12.0–15.0)
Hemoglobin: 10.9 g/dL — ABNORMAL LOW (ref 12.0–15.0)
O2 Saturation: 74 %
O2 Saturation: 75 %
Potassium: 3.7 mmol/L (ref 3.5–5.1)
Potassium: 3.9 mmol/L (ref 3.5–5.1)
Sodium: 144 mmol/L (ref 135–145)
Sodium: 145 mmol/L (ref 135–145)
TCO2: 26 mmol/L (ref 22–32)
TCO2: 26 mmol/L (ref 22–32)
pCO2, Ven: 42.3 mmHg — ABNORMAL LOW (ref 44–60)
pCO2, Ven: 42.3 mmHg — ABNORMAL LOW (ref 44–60)
pH, Ven: 7.37 (ref 7.25–7.43)
pH, Ven: 7.373 (ref 7.25–7.43)
pO2, Ven: 41 mmHg (ref 32–45)
pO2, Ven: 41 mmHg (ref 32–45)

## 2021-06-19 LAB — POCT I-STAT 7, (LYTES, BLD GAS, ICA,H+H)
Acid-Base Excess: 0 mmol/L (ref 0.0–2.0)
Bicarbonate: 23.6 mmol/L (ref 20.0–28.0)
Calcium, Ion: 1.2 mmol/L (ref 1.15–1.40)
HCT: 33 % — ABNORMAL LOW (ref 36.0–46.0)
Hemoglobin: 11.2 g/dL — ABNORMAL LOW (ref 12.0–15.0)
O2 Saturation: 99 %
Potassium: 3.8 mmol/L (ref 3.5–5.1)
Sodium: 143 mmol/L (ref 135–145)
TCO2: 25 mmol/L (ref 22–32)
pCO2 arterial: 33.4 mmHg (ref 32–48)
pH, Arterial: 7.457 — ABNORMAL HIGH (ref 7.35–7.45)
pO2, Arterial: 143 mmHg — ABNORMAL HIGH (ref 83–108)

## 2021-06-19 SURGERY — RIGHT/LEFT HEART CATH AND CORONARY ANGIOGRAPHY
Anesthesia: LOCAL

## 2021-06-19 MED ORDER — SODIUM CHLORIDE 0.9% FLUSH: 3.0000 mL | INTRAVENOUS | Status: DC | PRN

## 2021-06-19 MED ORDER — LIDOCAINE HCL (PF) 1 % IJ SOLN
INTRAMUSCULAR | Status: DC | PRN
  Administered 2021-06-19: 4 mL

## 2021-06-19 MED ORDER — LIDOCAINE HCL (PF) 1 % IJ SOLN
INTRAMUSCULAR | Status: AC
  Filled 2021-06-19: qty 30

## 2021-06-19 MED ORDER — ASPIRIN 81 MG PO CHEW: 81.0000 mg | CHEWABLE_TABLET | Freq: Every day | ORAL | Status: DC

## 2021-06-19 MED ORDER — FENTANYL CITRATE (PF) 100 MCG/2ML IJ SOLN
INTRAMUSCULAR | Status: DC | PRN
  Administered 2021-06-19: 25 ug via INTRAVENOUS

## 2021-06-19 MED ORDER — SODIUM CHLORIDE 0.9% FLUSH: 3.0000 mL | Freq: Two times a day (BID) | INTRAVENOUS | Status: DC

## 2021-06-19 MED ORDER — ASPIRIN 81 MG PO CHEW: 81.0000 mg | CHEWABLE_TABLET | ORAL | Status: DC

## 2021-06-19 MED ORDER — OXYCODONE HCL 5 MG PO TABS: 5.0000 mg | ORAL_TABLET | ORAL | Status: DC | PRN

## 2021-06-19 MED ORDER — SODIUM CHLORIDE 0.9 % WEIGHT BASED INFUSION: 1.0000 mL/kg/h | INTRAVENOUS | Status: DC

## 2021-06-19 MED ORDER — SODIUM CHLORIDE 0.9 % IV SOLN: INTRAVENOUS | Status: DC

## 2021-06-19 MED ORDER — APIXABAN 5 MG PO TABS: 5.0000 mg | ORAL_TABLET | Freq: Two times a day (BID) | ORAL | 11 refills | Status: DC

## 2021-06-19 MED ORDER — IOHEXOL 350 MG/ML SOLN
INTRAVENOUS | Status: DC | PRN
  Administered 2021-06-19: 08:00:00 75 mL

## 2021-06-19 MED ORDER — FENTANYL CITRATE (PF) 100 MCG/2ML IJ SOLN
INTRAMUSCULAR | Status: AC
  Filled 2021-06-19: qty 2

## 2021-06-19 MED ORDER — HEPARIN (PORCINE) IN NACL 1000-0.9 UT/500ML-% IV SOLN
INTRAVENOUS | Status: AC
  Filled 2021-06-19: qty 1000

## 2021-06-19 MED ORDER — SODIUM CHLORIDE 0.9 % IV SOLN: 250.0000 mL | INTRAVENOUS | Status: DC | PRN

## 2021-06-19 MED ORDER — ONDANSETRON HCL 4 MG/2ML IJ SOLN: 4.0000 mg | Freq: Four times a day (QID) | INTRAMUSCULAR | Status: DC | PRN

## 2021-06-19 MED ORDER — VERAPAMIL HCL 2.5 MG/ML IV SOLN
INTRAVENOUS | Status: AC
  Filled 2021-06-19: qty 2

## 2021-06-19 MED ORDER — SODIUM CHLORIDE 0.9 % WEIGHT BASED INFUSION
3.0000 mL/kg/h | INTRAVENOUS | Status: AC
  Administered 2021-06-19: 06:00:00 3 mL/kg/h via INTRAVENOUS

## 2021-06-19 MED ORDER — HYDRALAZINE HCL 20 MG/ML IJ SOLN: 10.0000 mg | INTRAMUSCULAR | Status: DC | PRN

## 2021-06-19 MED ORDER — HEPARIN SODIUM (PORCINE) 1000 UNIT/ML IJ SOLN
INTRAMUSCULAR | Status: AC
  Filled 2021-06-19: qty 10

## 2021-06-19 MED ORDER — HEPARIN (PORCINE) IN NACL 1000-0.9 UT/500ML-% IV SOLN
INTRAVENOUS | Status: DC | PRN
  Administered 2021-06-19 (×2): 500 mL

## 2021-06-19 MED ORDER — ACETAMINOPHEN 325 MG PO TABS: 650.0000 mg | ORAL_TABLET | ORAL | Status: DC | PRN

## 2021-06-19 MED ORDER — MIDAZOLAM HCL 2 MG/2ML IJ SOLN
INTRAMUSCULAR | Status: DC | PRN
Start: 2021-06-19 — End: 2021-06-19
  Administered 2021-06-19: 1 mg via INTRAVENOUS

## 2021-06-19 MED ORDER — VERAPAMIL HCL 2.5 MG/ML IV SOLN
INTRAVENOUS | Status: DC | PRN
  Administered 2021-06-19: 08:00:00 10 mL via INTRA_ARTERIAL

## 2021-06-19 MED ORDER — MIDAZOLAM HCL 2 MG/2ML IJ SOLN
INTRAMUSCULAR | Status: AC
  Filled 2021-06-19: qty 2

## 2021-06-19 MED ORDER — LABETALOL HCL 5 MG/ML IV SOLN: 10.0000 mg | INTRAVENOUS | Status: DC | PRN

## 2021-06-19 MED ORDER — HEPARIN SODIUM (PORCINE) 1000 UNIT/ML IJ SOLN
INTRAMUSCULAR | Status: DC | PRN
  Administered 2021-06-19: 4000 [IU] via INTRAVENOUS

## 2021-06-19 SURGICAL SUPPLY — 13 items
CATH 5FR JL3.5 JR4 ANG PIG MP (CATHETERS) ×1 IMPLANT
CATH BALLN WEDGE 5F 110CM (CATHETERS) ×1 IMPLANT
DEVICE RAD TR BAND REGULAR (VASCULAR PRODUCTS) ×1 IMPLANT
GLIDESHEATH SLEND A-KIT 6F 22G (SHEATH) ×1 IMPLANT
GUIDEWIRE INQWIRE 1.5J.035X260 (WIRE) IMPLANT
INQWIRE 1.5J .035X260CM (WIRE) ×2
KIT HEART LEFT (KITS) ×2 IMPLANT
PACK CARDIAC CATHETERIZATION (CUSTOM PROCEDURE TRAY) ×2 IMPLANT
SHEATH GLIDE SLENDER 4/5FR (SHEATH) ×1 IMPLANT
SHEATH PROBE COVER 6X72 (BAG) ×1 IMPLANT
TRANSDUCER W/STOPCOCK (MISCELLANEOUS) ×2 IMPLANT
TUBING CIL FLEX 10 FLL-RA (TUBING) ×2 IMPLANT
WIRE EMERALD ST .035X260CM (WIRE) ×1 IMPLANT

## 2021-06-19 NOTE — Interval H&P Note (Signed)
Cath Lab Visit (complete for each Cath Lab visit) ? ?Clinical Evaluation Leading to the Procedure:  ? ?ACS: No. ? ?Non-ACS:   ? ?Anginal Classification: CCS III ? ?Anti-ischemic medical therapy: Minimal Therapy (1 class of medications) ? ?Non-Invasive Test Results: No non-invasive testing performed ? ?Prior CABG: No previous CABG ? ? ? ? ? ?History and Physical Interval Note: ? ?06/19/2021 ?7:21 AM ? ?Marissa Powers  has presented today for surgery, with the diagnosis of arotic stenosis.  The various methods of treatment have been discussed with the patient and family. After consideration of risks, benefits and other options for treatment, the patient has consented to  Procedure(s): ?RIGHT/LEFT HEART CATH AND CORONARY ANGIOGRAPHY (N/A) as a surgical intervention.  The patient's history has been reviewed, patient examined, no change in status, stable for surgery.  I have reviewed the patient's chart and labs.  Questions were answered to the patient's satisfaction.   ? ? ?[name] ? ? ?

## 2021-06-19 NOTE — CV Procedure (Signed)
Widely patent RCA, circumflex, left main, and LAD.  First diagonal contains 80% mid stenosis before bifurcation. ?LV function not assessed.  LVEDP 20 mmHg. 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  Pulmonary resistance 1.8 Wood units.  Pulmonary wedge mean 12 mmHg.  WHO group 2. ?Cardiac output 6.63 L/min with index 3.42 L/min/m? ?Calculated aortic valve area 0.89 cm? with aortic valve index 0.46 cm? ?

## 2021-06-23 ENCOUNTER — Other Ambulatory Visit: Payer: Self-pay

## 2021-06-23 ENCOUNTER — Encounter: Payer: Self-pay | Admitting: Cardiovascular Disease

## 2021-06-23 ENCOUNTER — Ambulatory Visit (INDEPENDENT_AMBULATORY_CARE_PROVIDER_SITE_OTHER): Payer: Medicare Other | Admitting: Cardiovascular Disease

## 2021-06-23 VITALS — BP 136/82 | HR 62 | Ht 65.5 in | Wt 186.6 lb

## 2021-06-23 DIAGNOSIS — I350 Nonrheumatic aortic (valve) stenosis: Secondary | ICD-10-CM | POA: Diagnosis not present

## 2021-06-23 DIAGNOSIS — I25118 Atherosclerotic heart disease of native coronary artery with other forms of angina pectoris: Secondary | ICD-10-CM | POA: Diagnosis not present

## 2021-06-23 NOTE — Progress Notes (Signed)
[facility] Consult Note  Chief Complaint  Patient presents with   New Patient (Initial Visit)    Severe aortic stenosis   History of Present Illness: 81 yo female with history of mild CAD, arthritis, polymyalgia rheumatica, paroxysmal atrial fibrillation, GERD, hyperlipidemia, HTN, hypothyroidism and severe aortic stenosis here today as a new consult, referred by Dr. Tamala Julian, for further discussion regarding her aortic stenosis and possible TAVR. She has been followed by Dr. Tamala Julian for moderate aortic stenosis. She has recently reported worsened dyspnea on exertion and progressive fatigue when walking. Echo 06/09/21 with LVEF=60-65%, grade II diastolic dysfunction. Normal RV size and function. Mild mitral regurgitation. The aortic valve leaflets are thickened and calcified with restricted leaflet excursion. Mean gradient 40 mmHg, peak gradient 63 mmHg, AVA 0.76 cm2. This is consistent with severe aortic stenosis. Cardiac cath 06/19/21 with widely patent LAD, Circumflex and RCA. Moderate disease in the Diagonal branch. Medical management of CAD planned by Dr. Tamala Julian. She is known to have paroxysmal atrial fibrillation and is on Eliquis. She has polymyalgia rheumatica and is on methotrexate and prednisone chronically.   She tells me today that she has had progressive dyspnea on exertion and fatigue, especially when walking her dog. No chest pain or dizziness. She lives in [place] alone. She was a housewife. Her son is [employer]aley [employer]op with Orthopedic surgery and her husband who passed 16 years ago was a Water quality scientist here in [place]. She has no active dental issues.   Primary Care Physician: Crist Infante, MD Primary Cardiologist: Tamala Julian Referring Cardiologist: Tamala Julian  Past Medical History:  Diagnosis Date   Arthritis    osteoarthritis   Atrial fibrillation [facility])    CAD (coronary artery disease)    Dysplasia of cervix, low grade (CIN 1) 1990   HPV, Cryo, Laser   Fibroadenoma    Left, at 5  o'clock, not excised    Fibroid 2002   1 cm, 2 cm   GERD (gastroesophageal reflux disease)    Hypercholesteremia    Hypertension    Hyperthyroidism    Severe aortic stenosis     Past Surgical History:  Procedure Laterality Date   BREAST BIOPSY Bilateral 1970's   x2 one a side   CATARACT EXTRACTION     bilateral   implantable loop recorder removal  05/30/2021   MDT reveal LINQ removed by Dr Rayann Heman   LOOP RECORDER INSERTION N/A 08/11/2017   Procedure: LOOP RECORDER INSERTION;  Surgeon: Thompson Grayer, MD;  Location: [facility];  Service: Cardiovascular;  Laterality: N/A;   RETINAL DETACHMENT SURGERY  2000   RIGHT/LEFT HEART CATH AND CORONARY ANGIOGRAPHY N/A 06/19/2021   Procedure: RIGHT/LEFT HEART CATH AND CORONARY ANGIOGRAPHY;  Surgeon: Belva Crome, MD;  Location: [facility];  Service: Cardiovascular;  Laterality: N/A;   THYROIDECTOMY     TONSILLECTOMY     TOTAL KNEE ARTHROPLASTY  04/25/2012   Procedure: TOTAL KNEE BILATERAL;  Surgeon: Gearlean Alf, MD;  Location: [facility];  Service: Orthopedics;  Laterality: Bilateral;    Current Outpatient Medications  Medication Sig Dispense Refill   acetaminophen (TYLENOL) 500 MG tablet Take 1,000 mg by mouth every 6 (six) hours as needed (for pain.).     amoxicillin (AMOXIL) 500 MG capsule Take 2,000 mg by mouth See admin instructions. Take 4 capsules (2000 mg) by mouth 1 hour prior to dental appointments     apixaban (ELIQUIS) 5 MG TABS tablet Take 1 tablet (5 mg total) by mouth 2 (two)  times daily. Resume Apixaban at 8 PM 06/19/2921 60 tablet 11   B-D TB SYRINGE 1CC/27GX1/2" 27G X 1/2" 1 ML MISC Inject into the skin once a week.     Calcium Carb-Cholecalciferol (CALCIUM + D3 PO) Take 1 tablet by mouth in the morning.     cholecalciferol (VITAMIN D3) 25 MCG (1000 UNIT) tablet Take 1,000 Units by mouth in the morning.     Cyanocobalamin (B-12 PO) Take 1,000 mcg by mouth daily.     denosumab (PROLIA) 60 MG/ML SOSY injection  Inject 60 mg into the skin every 6 (six) months.     dicyclomine (BENTYL) 10 MG capsule Take 10 mg by mouth 3 (three) times daily as needed (abdominal cramps).     folic acid (FOLVITE) 1 MG tablet Take 2 mg by mouth in the morning.     levothyroxine (SYNTHROID, LEVOTHROID) 88 MCG tablet Take 88 mcg by mouth daily before breakfast.     Loperamide HCl (IMODIUM A-D PO) Take by mouth.     Methotrexate 25 MG/ML SOSY Inject 15 mg into the skin every Wednesday. 0.6 ml     metoprolol succinate (TOPROL-XL) 25 MG 24 hr tablet Take 25 mg by mouth every evening.     metoprolol tartrate (LOPRESSOR) 25 MG tablet Take 1 tablet (25 mg total) by mouth every 6 (six) hours as needed (heart racing). 45 tablet 1   mometasone (ELOCON) 0.1 % ointment Apply topically to skin once weekly (Patient taking differently: Apply 1 application. topically daily as needed (skin irritation.).) 45 g 1   nitroGLYCERIN (NITROSTAT) 0.4 MG SL tablet Place 1 tablet (0.4 mg total) under the tongue every 5 (five) minutes as needed for chest pain. 25 tablet 3   pantoprazole (PROTONIX) 40 MG tablet Take 40 mg by mouth in the morning and at bedtime.     predniSONE (DELTASONE) 1 MG tablet Take 2 mg by mouth daily with breakfast.     telmisartan (MICARDIS) 20 MG tablet Take 20 mg by mouth every evening.     zolpidem (AMBIEN) 5 MG tablet Take 2.5 mg by mouth at bedtime as needed for sleep.      No current facility-administered medications for this visit.    Allergies  Allergen Reactions   Cashew Nut Oil Other (See Comments) and Anaphylaxis    Tingley, swelling Tingley, swelling    Social History   Socioeconomic History   Marital status: Widowed    Spouse name: Not on file   Number of children: 2   Years of education: Not on file   Highest education level: Not on file  Occupational History   Not on file  Tobacco Use   Smoking status: Never    Passive exposure: Never   Smokeless tobacco: Never  Substance and Sexual Activity    Alcohol use: Yes    Alcohol/week: 4.0 - 6.0 standard drinks    Types: 4 - 6 Standard drinks or equivalent per week   Drug use: No   Sexual activity: Not Currently    Partners: Male    Birth control/protection: Post-menopausal  Other Topics Concern   Not on file  Social History Narrative   Not on file   Social Determinants of Health   Financial Resource Strain: Not on file  Food Insecurity: Not on file  Transportation Needs: Not on file  Physical Activity: Not on file  Stress: Not on file  Social Connections: Not on file  Intimate Partner Violence: Not on file    Family  History  Problem Relation Age of Onset   Breast cancer Maternal Grandmother    Heart attack Maternal Grandmother    Aortic aneurysm Mother    Clotting disorder Mother    Breast cancer Maternal Aunt 54   Heart disease Brother        stints   Healthy Father     Review of Systems:  As stated in the HPI and otherwise negative.   BP 136/82    Pulse 62    Ht 5' 5.5" (1.664 m)    Wt 186 lb 9.6 oz (84.6 kg)    SpO2 98%    BMI 30.58 kg/m   Physical Examination: General: Well developed, well nourished, NAD  HEENT: OP clear, mucus membranes moist  SKIN: warm, dry. No rashes. Neuro: No focal deficits  Musculoskeletal: Muscle strength 5/5 all ext  Psychiatric: Mood and affect normal  Neck: No JVD, no carotid bruits, no thyromegaly, no lymphadenopathy.  Lungs:Clear bilaterally, no wheezes, rhonci, crackles Cardiovascular: Regular rate and rhythm. Loud, harsh, late peaking systolic murmur.  Abdomen:Soft. Bowel sounds present. Non-tender.  Extremities: No lower extremity edema. Pulses are 2 + in the bilateral DP/PT.  EKG:  EKG is not ordered today. The ekg ordered today demonstrates   Echo 06/09/21:  1. Severe AS (mean gradient 40 mmHg, peak velocity 4 m/s).   2. Left ventricular ejection fraction, by estimation, is 60 to 65%. The  left ventricle has normal function. The left ventricle has no regional  wall  motion abnormalities. Left ventricular diastolic parameters are  consistent with Grade II diastolic  dysfunction (pseudonormalization).   3. Right ventricular systolic function is normal. The right ventricular  size is normal. Tricuspid regurgitation signal is inadequate for assessing  PA pressure.   4. Left atrial size was mild to moderately dilated.   5. The mitral valve is normal in structure. Mild mitral valve  regurgitation. No evidence of mitral stenosis.   6. The aortic valve is calcified. Aortic valve regurgitation is mild.  Severe aortic valve stenosis.   7. The inferior vena cava is normal in size with greater than 50%  respiratory variability, suggesting right atrial pressure of 3 mmHg.   FINDINGS   Left Ventricle: Left ventricular ejection fraction, by estimation, is 60  to 65%. The left ventricle has normal function. The left ventricle has no  regional wall motion abnormalities. The left ventricular internal cavity  size was normal in size. There is   no left ventricular hypertrophy. Left ventricular diastolic parameters  are consistent with Grade II diastolic dysfunction (pseudonormalization).   Right Ventricle: The right ventricular size is normal. Right ventricular  systolic function is normal. Tricuspid regurgitation signal is inadequate  for assessing PA pressure. The tricuspid regurgitant velocity is 2.09 m/s,  and with an assumed right atrial   pressure of 3 mmHg, the estimated right ventricular systolic pressure is  26.9 mmHg.   Left Atrium: Left atrial size was mild to moderately dilated.   Right Atrium: Right atrial size was normal in size.   Pericardium: There is no evidence of pericardial effusion.   Mitral Valve: The mitral valve is normal in structure. Mild mitral valve  regurgitation. No evidence of mitral valve stenosis.   Tricuspid Valve: The tricuspid valve is normal in structure. Tricuspid  valve regurgitation is trivial. No evidence of tricuspid  stenosis.   Aortic Valve: The aortic valve is calcified. Aortic valve regurgitation is  mild. Aortic regurgitation PHT measures 738 msec. Severe aortic stenosis  is present. Aortic valve mean gradient measures 40.0 mmHg. Aortic valve  peak gradient measures 63.0 mmHg.  Aortic valve area, by VTI measures 0.85 cm.   Pulmonic Valve: The pulmonic valve was normal in structure. Pulmonic valve  regurgitation is not visualized. No evidence of pulmonic stenosis.   Aorta: The aortic root is normal in size and structure.   Venous: The inferior vena cava is normal in size with greater than 50%  respiratory variability, suggesting right atrial pressure of 3 mmHg.   IAS/Shunts: No atrial level shunt detected by color flow Doppler.   Additional Comments: Severe AS (mean gradient 40 mmHg, peak velocity 4  m/s).      LEFT VENTRICLE  PLAX 2D  LVIDd:         5.10 cm   Diastology  LVIDs:         3.80 cm   LV e' medial:    5.98 cm/s  LV PW:         0.90 cm   LV E/e' medial:  15.3  LV IVS:        0.70 cm   LV e' lateral:   7.51 cm/s  LVOT diam:     1.90 cm   LV E/e' lateral: 12.1  LV SV:         92  LV SV Index:   48  LVOT Area:     2.84 cm      RIGHT VENTRICLE  RV Basal diam:  3.00 cm  RV S prime:     11.30 cm/s  TAPSE (M-mode): 2.4 cm  RVSP:           20.5 mmHg   LEFT ATRIUM             Index        RIGHT ATRIUM           Index  LA diam:        4.10 cm 2.12 cm/m   RA Pressure: 3.00 mmHg  LA Vol (A2C):   61.1 ml 31.59 ml/m  RA Area:     14.60 cm  LA Vol (A4C):   84.5 ml 43.69 ml/m  RA Volume:   40.90 ml  21.14 ml/m  LA Biplane Vol: 71.5 ml 36.96 ml/m   AORTIC VALVE  AV Area (Vmax):    0.85 cm  AV Area (Vmean):   0.76 cm  AV Area (VTI):     0.85 cm  AV Vmax:           397.00 cm/s  AV Vmean:          298.000 cm/s  AV VTI:            1.090 m  AV Peak Grad:      63.0 mmHg  AV Mean Grad:      40.0 mmHg  LVOT Vmax:         119.00 cm/s  LVOT Vmean:        79.800 cm/s  LVOT  VTI:          0.326 m  LVOT/AV VTI ratio: 0.30  AI PHT:            738 msec     AORTA  Ao Root diam: 2.60 cm  Ao Asc diam:  3.30 cm   MITRAL VALVE               TRICUSPID VALVE  MV Area (PHT):  TR Peak grad:   17.5 mmHg  MV Decel Time:             TR Vmax:        209.00 cm/s  MV E velocity: 91.20 cm/s  Estimated RAP:  3.00 mmHg  MV A velocity: 88.60 cm/s  RVSP:           20.5 mmHg  MV E/A ratio:  1.03                             SHUNTS                             Systemic VTI:  0.33 m                             Systemic Diam: 1.90 cm   Cardiac cath 06/19/21: CONCLUSIONS:  Severe aortic stenosis with mean transvalvular gradient 50.3 mmHg, and calculated aortic valve area 0.89 cm.  Widely patent left main, LAD, circumflex, and right coronary. First diagonal contains 70 to 80% mid RCA.  Mild pulmonary hypertension, mean PA pressure 24 mmHg. Mean wedge pressure 14 mmHg (likely inaccurate) LVEDP 21 mmHg. WHO group 2.  PA arterial saturation 75%. Aortic arterial saturation 99%. Cardiac output 6.63 L/min. RECOMMENDATIONS:  Severe aortic stenosis and will be referred to Dr. Angelena Form in the structural heart clinic to begin work-up for TAVR.  Risk factor modification for minor coronary atherosclerosis as has been occurring over the past 3 years. Surgeon Notes   06/19/2021 8:21 AM CV Procedure signed by Belva Crome, MD  Procedural Details Technical Details The right radial area was sterilely prepped and draped. Intravenous sedation with Versed and fentanyl was administered. 1% Xylocaine was infiltrated to achieve local analgesia. Using real-time vascular ultrasound, a double wall stick with an angiocath was utilized to obtain intra-arterial access. A VUS image was saved for the permanent record.The modified Seldinger technique was used to place a 62F " Slender" sheath in the right radial artery. Weight based heparin was administered. Coronary angiography was done using 5 F catheters.  Right coronary angiography was performed with a JR4. Left ventricular hemodymic recordings were done using the JR 4 catheter after crossing with a .035 straight wire. Left coronary angiography was performed with a JL 3.5 cm. This was somewhat difficult due to aortic root enlargement.  Right heart catheterization was performed by exchanging a previously placed antecubital IV angio-cath for a 5 French Slender sheath. 1% Xylocaine was used to locally nesthetize the area around the IV site. The IV catheter was wired using an .018 guidewire. The modified Seldinger technique was used to place the 5 [place] sheath. Double glove technique was used to enhance sterility. After sheath insertion, right heart cath was performed using a 5 French balloon tipped catheter and fluoroscopic guidance. Pressures were recorded in each chamber and in the pulmonary capillary wedge position.. The main pulmonary artery O2 saturation was sampled.   Hemostasis was achieved using a pneumatic band.  During this procedure the patient is administered a total of Versed 1 mg and Fentanyl 25 mcg to achieve and maintain moderate conscious sedation. The patient's heart rate, blood pressure, and oxygen saturation are monitored continuously during the procedure. The period of conscious sedation is 28 minutes, of which I was present face-to-face 100% of this time.  Estimated blood loss <  50 mL.   During this procedure medications were administered to achieve and maintain moderate conscious sedation while the patient's heart rate, blood pressure, and oxygen saturation were continuously monitored and I was present face-to-face 100% of this time.  Medications (Filter: Administrations occurring from 0721 to 0825 on 06/19/21)  Heparin (Porcine) in NaCl 1000-0.9 UT/500ML-% SOLN (mL) Total volume: 1,000 mL  Date/Time Rate/Dose/Volume Action    06/19/21 0735 500 mL Given   0735 500 mL Given   fentaNYL (SUBLIMAZE) injection (mcg) Total dose: 25  mcg  Date/Time Rate/Dose/Volume Action    06/19/21 0742 25 mcg Given   midazolam (VERSED) injection (mg) Total dose: 1 mg  Date/Time Rate/Dose/Volume Action    06/19/21 0742 1 mg Given   lidocaine (PF) (XYLOCAINE) 1 % injection (mL) Total volume: 4 mL  Date/Time Rate/Dose/Volume Action    06/19/21 0746 4 mL Given   Radial Cocktail/Verapamil only (mL) Total volume: 10 mL  Date/Time Rate/Dose/Volume Action    06/19/21 0751 10 mL Given   heparin sodium (porcine) injection (Units) Total dose: 4,000 Units  Date/Time Rate/Dose/Volume Action    06/19/21 0758 4,000 Units Given   iohexol (OMNIPAQUE) 350 MG/ML injection (mL) Total volume: 75 mL  Date/Time Rate/Dose/Volume Action    06/19/21 0824 75 mL Given   Sedation Time Sedation Time Physician-1: 27 minutes 58 seconds  Contrast Medication Name Total Dose  iohexol (OMNIPAQUE) 350 MG/ML injection 75 mL  Radiation/Fluoro Fluoro time: 6 (min)  DAP: 13 (Gycm2)  Cumulative Air Kerma: 6.7 (mGy)  Complications Complications documented before study signed (06/19/2021 8:37 AM)  RIGHT/LEFT HEART CATH AND CORONARY ANGIOGRAPHY  None Documented by Karma Greaser, RT 06/19/2021 8:22 AM  Date Found: 06/19/2021  Time Range: Intraprocedure    Coronary Findings Diagnostic Dominance: Right  Left Anterior Descending  Prox LAD lesion is 25% stenosed.  Mid LAD lesion is 25% stenosed.  First Diagonal Branch  1st Diag lesion is 80% stenosed.  Intervention  No interventions have been documented.  Right Heart Right Heart Pressures Hemodynamic findings consistent with mild pulmonary hypertension and aortic valve stenosis. Elevated LV EDP consistent with volume overload.  Left Heart Left Ventricle The left ventricular size is normal. The left ventricular systolic function is normal. LV end diastolic pressure is moderately elevated.   Aortic Valve There is severe aortic valve stenosis. The aortic valve is calcified.  Coronary  Diagrams Diagnostic Dominance: Right  &&&&&&&&  Intervention Implants  No implant documentation for this case.   Syngo Images Show images for CARDIAC CATHETERIZATION  Images on Long Term Storage Show images for Larina, [place] to Procedure Log   Procedure Log  Hemo Data Flowsheet Row Most Recent Value  Fick Cardiac Output 6.63 L/min  Fick Cardiac Output Index 3.42 (L/min)/BSA  Aortic Mean Gradient 50.28 mmHg  Aortic Peak Gradient 49.9 mmHg  Aortic Valve Area 0.89  Aortic Value Area Index 0.46 cm2/BSA  RA A Wave 9 mmHg  RA V Wave 6 mmHg  RA Mean 5 mmHg  RV Systolic Pressure 37 mmHg  RV Diastolic Pressure 2 mmHg  RV EDP 9 mmHg  PA Systolic Pressure 38 mmHg  PA Diastolic Pressure 10 mmHg  PA Mean 24 mmHg  PW A Wave 20 mmHg  PW V Wave 23 mmHg  PW Mean 12 mmHg  AO Systolic Pressure 403 mmHg  AO Diastolic Pressure 75 mmHg  AO Mean 474 mmHg  LV Systolic Pressure 259 mmHg  LV Diastolic Pressure 6 mmHg  LV EDP 21  mmHg  AOp Systolic Pressure 937 mmHg  AOp Diastolic Pressure 63 mmHg  AOp Mean Pressure 95 mmHg  LVp Systolic Pressure 342 mmHg  LVp Diastolic Pressure 5 mmHg  LVp EDP Pressure 20 mmHg  QP/QS 1  TPVR Index 7.01 HRUI  TSVR Index 31.83 HRUI  PVR SVR Ratio 0.12  TPVR/TSVR Ratio 0.22    Recent Labs: 06/13/2021: BUN 10; Creatinine, Ser 0.77; Platelets 243 06/19/2021: Hemoglobin 10.9; Hemoglobin 10.9; Potassium 3.7; Potassium 3.9; Sodium 145; Sodium 144   Lipid Panel    Component Value Date/Time   CHOL 122 10/28/2018 0832   TRIG 108 10/28/2018 0832   HDL 55 10/28/2018 0832   CHOLHDL 2.2 10/28/2018 0832   LDLCALC 45 10/28/2018 0832     Wt Readings from Last 3 Encounters:  06/23/21 186 lb 9.6 oz (84.6 kg)  06/19/21 185 lb (83.9 kg)  06/13/21 186 lb 12.8 oz (84.7 kg)     Assessment and Plan:   1. Severe Aortic Valve Stenosis: She has severe, stage D aortic valve stenosis. I have personally reviewed the echo images. The aortic valve is thickened,  calcified with limited leaflet mobility. I think she would benefit from AVR. Given advanced age, she is not a good candidate for conventional AVR by surgical approach. I think she may be a good candidate for TAVR.   I have reviewed the natural history of aortic stenosis with the patient and their family members  who are present today. We have discussed the limitations of medical therapy and the poor prognosis associated with symptomatic aortic stenosis. We have reviewed potential treatment options, including palliative medical therapy, conventional surgical aortic valve replacement, and transcatheter aortic valve replacement. We discussed treatment options in the context of the patient's specific comorbid medical conditions.   She would like to proceed with planning for TAVR. Risks and benefits of the valve procedure are reviewed with the patient. We will plan a cardiac CT, CTA of the chest/abdomen and pelvis and she will then be referred to see Dr. Cyndia Bent.     Current medicines are reviewed at length with the patient today.  The patient does not have concerns regarding medicines.  The following changes have been made:  no change  Labs/ tests ordered today include:  No orders of the defined types were placed in this encounter.  Disposition:   F/U with the valve team.   Signed, Lauree Chandler, MD 06/23/2021 10:09 AM    [address], [place], [place]  [address] Phone: [phone]; Fax: [phone]

## 2021-06-23 NOTE — Progress Notes (Signed)
Pre Surgical Assessment: 5 M Walk Test  24M=16.65f  5 Meter Walk Test- trial 1: 5.28 seconds 5 Meter Walk Test- trial 2: 4.25 seconds 5 Meter Walk Test- trial 3: 4.58 seconds 5 Meter Walk Test Average:4.70  seconds

## 2021-06-23 NOTE — Patient Instructions (Signed)
Medication Instructions:  ?Your physician recommends that you continue on your current medications as directed. Please refer to the Current Medication list given to you today. ? ? ?*If you need a refill on your cardiac medications before your next appointment, please call your pharmacy* ? ? ?Lab Work: ?None ordered  ? ?If you have labs (blood work) drawn today and your tests are completely normal, you will receive your results only by: ?MyChart Message (if you have MyChart) OR ?A paper copy in the mail ?If you have any lab test that is abnormal or we need to change your treatment, we will call you to review the results. ? ? ?Testing/Procedures: ?You are scheduled for CT scans on Monday, June 30, 2021. Please refer to instructions given.  ? ?Follow-Up: ?You are scheduled for a surgical evaluation with Dr. Cyndia Bent on Wednesday, Aug 13, 2021 a 1:00 pm. Please arrive at 12:45 PM.  ? ? ?Other Instructions ?None   ?

## 2021-06-30 ENCOUNTER — Other Ambulatory Visit: Payer: Self-pay

## 2021-06-30 ENCOUNTER — Ambulatory Visit (HOSPITAL_COMMUNITY)
Admission: RE | Admit: 2021-06-30 | Discharge: 2021-06-30 | Disposition: A | Discharge status: Home / Self Care | Payer: Medicare Other | Source: Ambulatory Visit | Attending: Cardiovascular Disease | Admitting: Cardiovascular Disease

## 2021-06-30 DIAGNOSIS — I350 Nonrheumatic aortic (valve) stenosis: Secondary | ICD-10-CM | POA: Diagnosis not present

## 2021-06-30 DIAGNOSIS — I358 Other nonrheumatic aortic valve disorders: Secondary | ICD-10-CM | POA: Diagnosis not present

## 2021-06-30 DIAGNOSIS — Z01818 Encounter for other preprocedural examination: Secondary | ICD-10-CM | POA: Diagnosis not present

## 2021-06-30 DIAGNOSIS — I771 Stricture of artery: Secondary | ICD-10-CM | POA: Diagnosis not present

## 2021-06-30 DIAGNOSIS — I2510 Atherosclerotic heart disease of native coronary artery without angina pectoris: Secondary | ICD-10-CM | POA: Diagnosis not present

## 2021-06-30 DIAGNOSIS — K5730 Diverticulosis of large intestine without perforation or abscess without bleeding: Secondary | ICD-10-CM | POA: Diagnosis not present

## 2021-06-30 MED ORDER — IOHEXOL 350 MG/ML SOLN
100.0000 mL | Freq: Once | INTRAVENOUS | Status: AC | PRN
  Administered 2021-06-30: 100 mL via INTRAVENOUS

## 2021-07-04 DIAGNOSIS — Z1231 Encounter for screening mammogram for malignant neoplasm of breast: Secondary | ICD-10-CM | POA: Diagnosis not present

## 2021-07-07 DIAGNOSIS — E785 Hyperlipidemia, unspecified: Secondary | ICD-10-CM | POA: Diagnosis not present

## 2021-07-07 DIAGNOSIS — E039 Hypothyroidism, unspecified: Secondary | ICD-10-CM | POA: Diagnosis not present

## 2021-07-07 DIAGNOSIS — R7301 Impaired fasting glucose: Secondary | ICD-10-CM | POA: Diagnosis not present

## 2021-07-07 DIAGNOSIS — I10 Essential (primary) hypertension: Secondary | ICD-10-CM | POA: Diagnosis not present

## 2021-07-07 DIAGNOSIS — E538 Deficiency of other specified B group vitamins: Secondary | ICD-10-CM | POA: Diagnosis not present

## 2021-07-07 DIAGNOSIS — R82998 Other abnormal findings in urine: Secondary | ICD-10-CM | POA: Diagnosis not present

## 2021-07-11 ENCOUNTER — Emergency Department (HOSPITAL_COMMUNITY): Payer: Medicare Other

## 2021-07-11 ENCOUNTER — Inpatient Hospital Stay (HOSPITAL_COMMUNITY)
Admission: EM | Admit: 2021-07-11 | Discharge: 2021-07-13 | DRG: 179 | Disposition: A | Discharge status: Home / Self Care | Payer: Medicare Other | Attending: Family Medicine | Admitting: Family Medicine

## 2021-07-11 ENCOUNTER — Encounter (HOSPITAL_COMMUNITY): Payer: Self-pay

## 2021-07-11 ENCOUNTER — Other Ambulatory Visit: Payer: Self-pay

## 2021-07-11 DIAGNOSIS — Z79899 Other long term (current) drug therapy: Secondary | ICD-10-CM

## 2021-07-11 DIAGNOSIS — E860 Dehydration: Secondary | ICD-10-CM | POA: Diagnosis present

## 2021-07-11 DIAGNOSIS — I959 Hypotension, unspecified: Secondary | ICD-10-CM | POA: Diagnosis present

## 2021-07-11 DIAGNOSIS — I350 Nonrheumatic aortic (valve) stenosis: Secondary | ICD-10-CM | POA: Diagnosis present

## 2021-07-11 DIAGNOSIS — I2510 Atherosclerotic heart disease of native coronary artery without angina pectoris: Secondary | ICD-10-CM | POA: Diagnosis not present

## 2021-07-11 DIAGNOSIS — Z8249 Family history of ischemic heart disease and other diseases of the circulatory system: Secondary | ICD-10-CM | POA: Diagnosis not present

## 2021-07-11 DIAGNOSIS — I10 Essential (primary) hypertension: Secondary | ICD-10-CM | POA: Diagnosis present

## 2021-07-11 DIAGNOSIS — E7800 Pure hypercholesterolemia, unspecified: Secondary | ICD-10-CM | POA: Diagnosis present

## 2021-07-11 DIAGNOSIS — Z832 Family history of diseases of the blood and blood-forming organs and certain disorders involving the immune mechanism: Secondary | ICD-10-CM

## 2021-07-11 DIAGNOSIS — I4891 Unspecified atrial fibrillation: Secondary | ICD-10-CM | POA: Diagnosis not present

## 2021-07-11 DIAGNOSIS — M353 Polymyalgia rheumatica: Secondary | ICD-10-CM | POA: Diagnosis not present

## 2021-07-11 DIAGNOSIS — U071 COVID-19: Secondary | ICD-10-CM | POA: Diagnosis not present

## 2021-07-11 DIAGNOSIS — Z7952 Long term (current) use of systemic steroids: Secondary | ICD-10-CM | POA: Diagnosis not present

## 2021-07-11 DIAGNOSIS — D539 Nutritional anemia, unspecified: Secondary | ICD-10-CM | POA: Diagnosis present

## 2021-07-11 DIAGNOSIS — I480 Paroxysmal atrial fibrillation: Secondary | ICD-10-CM | POA: Diagnosis present

## 2021-07-11 DIAGNOSIS — Z96653 Presence of artificial knee joint, bilateral: Secondary | ICD-10-CM | POA: Diagnosis present

## 2021-07-11 DIAGNOSIS — Z79890 Hormone replacement therapy (postmenopausal): Secondary | ICD-10-CM

## 2021-07-11 DIAGNOSIS — E559 Vitamin D deficiency, unspecified: Secondary | ICD-10-CM | POA: Diagnosis present

## 2021-07-11 DIAGNOSIS — E890 Postprocedural hypothyroidism: Secondary | ICD-10-CM | POA: Diagnosis not present

## 2021-07-11 DIAGNOSIS — K219 Gastro-esophageal reflux disease without esophagitis: Secondary | ICD-10-CM | POA: Diagnosis not present

## 2021-07-11 DIAGNOSIS — Z7901 Long term (current) use of anticoagulants: Secondary | ICD-10-CM

## 2021-07-11 DIAGNOSIS — Z803 Family history of malignant neoplasm of breast: Secondary | ICD-10-CM | POA: Diagnosis not present

## 2021-07-11 DIAGNOSIS — R0602 Shortness of breath: Secondary | ICD-10-CM | POA: Diagnosis not present

## 2021-07-11 LAB — CBC WITH DIFFERENTIAL/PLATELET
Abs Immature Granulocytes: 0.03 10*3/uL (ref 0.00–0.07)
Basophils Absolute: 0 10*3/uL (ref 0.0–0.1)
Basophils Relative: 0 %
Eosinophils Absolute: 0 10*3/uL (ref 0.0–0.5)
Eosinophils Relative: 0 %
HCT: 35.2 % — ABNORMAL LOW (ref 36.0–46.0)
Hemoglobin: 11.8 g/dL — ABNORMAL LOW (ref 12.0–15.0)
Immature Granulocytes: 0 %
Lymphocytes Relative: 8 %
Lymphs Abs: 0.8 10*3/uL (ref 0.7–4.0)
MCH: 35.5 pg — ABNORMAL HIGH (ref 26.0–34.0)
MCHC: 33.5 g/dL (ref 30.0–36.0)
MCV: 106 fL — ABNORMAL HIGH (ref 80.0–100.0)
Monocytes Absolute: 1 10*3/uL (ref 0.1–1.0)
Monocytes Relative: 11 %
Neutro Abs: 7.8 10*3/uL — ABNORMAL HIGH (ref 1.7–7.7)
Neutrophils Relative %: 81 %
Platelets: 223 10*3/uL (ref 150–400)
RBC: 3.32 MIL/uL — ABNORMAL LOW (ref 3.87–5.11)
RDW: 13.8 % (ref 11.5–15.5)
WBC: 9.6 10*3/uL (ref 4.0–10.5)
nRBC: 0 % (ref 0.0–0.2)

## 2021-07-11 LAB — COMPREHENSIVE METABOLIC PANEL
ALT: 14 U/L (ref 0–44)
AST: 21 U/L (ref 15–41)
Albumin: 4.1 g/dL (ref 3.5–5.0)
Alkaline Phosphatase: 50 U/L (ref 38–126)
Anion gap: 9 (ref 5–15)
BUN: 12 mg/dL (ref 8–23)
CO2: 22 mmol/L (ref 22–32)
Calcium: 9.3 mg/dL (ref 8.9–10.3)
Chloride: 102 mmol/L (ref 98–111)
Creatinine, Ser: 0.85 mg/dL (ref 0.44–1.00)
GFR, Estimated: >60 mL/min (ref >60)
Glucose, Bld: 145 mg/dL — ABNORMAL HIGH (ref 70–99)
Potassium: 3.6 mmol/L (ref 3.5–5.1)
Sodium: 133 mmol/L — ABNORMAL LOW (ref 135–145)
Total Bilirubin: 1 mg/dL (ref 0.3–1.2)
Total Protein: 7.5 g/dL (ref 6.5–8.1)

## 2021-07-11 LAB — FERRITIN: Ferritin: 79 ng/mL (ref 11–307)

## 2021-07-11 LAB — FIBRINOGEN: Fibrinogen: 442 mg/dL (ref 210–475)

## 2021-07-11 LAB — PROCALCITONIN: Procalcitonin: <0.1 ng/mL

## 2021-07-11 LAB — C-REACTIVE PROTEIN: CRP: 6.5 mg/dL — ABNORMAL HIGH (ref <1.0)

## 2021-07-11 LAB — RESP PANEL BY RT-PCR (FLU A&B, COVID) ARPGX2
Influenza A by PCR: NEGATIVE
Influenza B by PCR: NEGATIVE
SARS Coronavirus 2 by RT PCR: POSITIVE — AB

## 2021-07-11 LAB — LACTIC ACID, PLASMA
Lactic Acid, Venous: 1.7 mmol/L (ref 0.5–1.9)
Lactic Acid, Venous: 1.2 mmol/L (ref 0.5–1.9)

## 2021-07-11 LAB — D-DIMER, QUANTITATIVE: D-Dimer, Quant: 0.43 ug/mL-FEU (ref 0.00–0.50)

## 2021-07-11 LAB — TRIGLYCERIDES: Triglycerides: 100 mg/dL (ref <150)

## 2021-07-11 LAB — LACTATE DEHYDROGENASE: LDH: 164 U/L (ref 98–192)

## 2021-07-11 LAB — MRSA NEXT GEN BY PCR, NASAL: MRSA by PCR Next Gen: NOT DETECTED

## 2021-07-11 MED ORDER — AMIODARONE HCL IN DEXTROSE 360-4.14 MG/200ML-% IV SOLN
30.0000 mg/h | INTRAVENOUS | Status: DC
  Administered 2021-07-11 – 2021-07-13 (×3): 30 mg/h via INTRAVENOUS
  Filled 2021-07-11 (×4): qty 200

## 2021-07-11 MED ORDER — PANTOPRAZOLE SODIUM 40 MG PO TBEC
40.0000 mg | DELAYED_RELEASE_TABLET | Freq: Every day | ORAL | Status: DC
  Administered 2021-07-12 – 2021-07-13 (×2): 40 mg via ORAL
  Filled 2021-07-11 (×2): qty 1

## 2021-07-11 MED ORDER — NIRMATRELVIR/RITONAVIR (PAXLOVID)TABLET: 3.0000 | ORAL_TABLET | Freq: Two times a day (BID) | ORAL | Status: DC

## 2021-07-11 MED ORDER — SODIUM CHLORIDE 0.9 % IV BOLUS: 500.0000 mL | Freq: Once | INTRAVENOUS | Status: DC

## 2021-07-11 MED ORDER — SODIUM CHLORIDE 0.9 % IV SOLN: Freq: Once | INTRAVENOUS | Status: AC

## 2021-07-11 MED ORDER — SODIUM CHLORIDE 0.9 % IV BOLUS
1000.0000 mL | Freq: Once | INTRAVENOUS | Status: AC
  Administered 2021-07-11: 1000 mL via INTRAVENOUS

## 2021-07-11 MED ORDER — FOLIC ACID 1 MG PO TABS
2.0000 mg | ORAL_TABLET | Freq: Every morning | ORAL | Status: DC
  Administered 2021-07-12 – 2021-07-13 (×2): 2 mg via ORAL
  Filled 2021-07-11 (×2): qty 2

## 2021-07-11 MED ORDER — ONDANSETRON HCL 4 MG/2ML IJ SOLN
4.0000 mg | Freq: Four times a day (QID) | INTRAMUSCULAR | Status: DC | PRN
  Administered 2021-07-11: 4 mg via INTRAVENOUS
  Filled 2021-07-11: qty 2

## 2021-07-11 MED ORDER — ZOLPIDEM TARTRATE 5 MG PO TABS
2.5000 mg | ORAL_TABLET | Freq: Every evening | ORAL | Status: DC | PRN
  Administered 2021-07-11 – 2021-07-12 (×2): 2.5 mg via ORAL
  Filled 2021-07-11 (×2): qty 1

## 2021-07-11 MED ORDER — MOLNUPIRAVIR EUA 200MG CAPSULE
4.0000 | ORAL_CAPSULE | Freq: Two times a day (BID) | ORAL | Status: DC
  Administered 2021-07-11 – 2021-07-13 (×5): 800 mg via ORAL
  Filled 2021-07-11: qty 4

## 2021-07-11 MED ORDER — VITAMIN B-12 1000 MCG PO TABS
1000.0000 ug | ORAL_TABLET | Freq: Every day | ORAL | Status: DC
  Administered 2021-07-12 – 2021-07-13 (×2): 1000 ug via ORAL
  Filled 2021-07-11 (×2): qty 1

## 2021-07-11 MED ORDER — ACETAMINOPHEN 500 MG PO TABS
1000.0000 mg | ORAL_TABLET | Freq: Four times a day (QID) | ORAL | Status: DC | PRN
  Administered 2021-07-11: 1000 mg via ORAL
  Filled 2021-07-11: qty 2

## 2021-07-11 MED ORDER — AMIODARONE LOAD VIA INFUSION
150.0000 mg | Freq: Once | INTRAVENOUS | Status: AC
  Administered 2021-07-11: 150 mg via INTRAVENOUS
  Filled 2021-07-11: qty 83.34

## 2021-07-11 MED ORDER — APIXABAN 5 MG PO TABS
5.0000 mg | ORAL_TABLET | Freq: Two times a day (BID) | ORAL | Status: DC
  Administered 2021-07-11 – 2021-07-13 (×4): 5 mg via ORAL
  Filled 2021-07-11 (×5): qty 1

## 2021-07-11 MED ORDER — CHLORHEXIDINE GLUCONATE CLOTH 2 % EX PADS
6.0000 | MEDICATED_PAD | Freq: Every day | CUTANEOUS | Status: DC
  Administered 2021-07-11 – 2021-07-12 (×2): 6 via TOPICAL

## 2021-07-11 MED ORDER — VITAMIN D 25 MCG (1000 UNIT) PO TABS
1000.0000 [IU] | ORAL_TABLET | Freq: Every morning | ORAL | Status: DC
  Administered 2021-07-12 – 2021-07-13 (×2): 1000 [IU] via ORAL
  Filled 2021-07-11 (×2): qty 1

## 2021-07-11 MED ORDER — PANTOPRAZOLE SODIUM 40 MG PO TBEC: 40.0000 mg | DELAYED_RELEASE_TABLET | Freq: Every day | ORAL | Status: DC

## 2021-07-11 MED ORDER — AMIODARONE HCL IN DEXTROSE 360-4.14 MG/200ML-% IV SOLN
60.0000 mg/h | INTRAVENOUS | Status: AC
  Administered 2021-07-11 (×2): 60 mg/h via INTRAVENOUS
  Filled 2021-07-11: qty 200

## 2021-07-11 MED ORDER — ONDANSETRON HCL 4 MG PO TABS
4.0000 mg | ORAL_TABLET | Freq: Four times a day (QID) | ORAL | Status: DC | PRN
  Administered 2021-07-12: 4 mg via ORAL
  Filled 2021-07-11: qty 1

## 2021-07-11 MED ORDER — DICYCLOMINE HCL 10 MG PO CAPS
10.0000 mg | ORAL_CAPSULE | Freq: Three times a day (TID) | ORAL | Status: DC | PRN
  Filled 2021-07-11: qty 1

## 2021-07-11 MED ORDER — DILTIAZEM HCL-DEXTROSE 125-5 MG/125ML-% IV SOLN (PREMIX): 5.0000 mg/h | INTRAVENOUS | Status: DC

## 2021-07-11 MED ORDER — DILTIAZEM LOAD VIA INFUSION
10.0000 mg | Freq: Once | INTRAVENOUS | Status: DC
Start: 2021-07-11 — End: 2021-07-11
  Filled 2021-07-11: qty 10

## 2021-07-11 MED ORDER — DEXAMETHASONE 2 MG PO TABS
6.0000 mg | ORAL_TABLET | ORAL | Status: DC
  Administered 2021-07-11 – 2021-07-13 (×3): 6 mg via ORAL
  Filled 2021-07-11: qty 1
  Filled 2021-07-11 (×2): qty 3

## 2021-07-11 MED ORDER — LEVOTHYROXINE SODIUM 88 MCG PO TABS
88.0000 ug | ORAL_TABLET | Freq: Every day | ORAL | Status: DC
  Administered 2021-07-12 – 2021-07-13 (×2): 88 ug via ORAL
  Filled 2021-07-11 (×2): qty 1

## 2021-07-11 NOTE — ED Triage Notes (Signed)
Patient tested postive for COVID this morning. Patient c/o SHOB, nausea. Patietn daughter states O2 sats being in the 76s. Patient also c/o genralized body aches and fever. Patient took tylenol for fever prior to coming to the ED. ?

## 2021-07-11 NOTE — Progress Notes (Signed)
?  Amiodarone Drug - Drug Interaction Consult Note ? ?Recommendations: No changes recommended at this time. ?Amiodarone is metabolized by the cytochrome P450 system and therefore has the potential to cause many drug interactions. Amiodarone has an average plasma half-life of 50 days (range 20 to 100 days).  ? ?There is potential for drug interactions to occur several weeks or months after stopping treatment and the onset of drug interactions may be slow after initiating amiodarone.  ? ?'[]'$  Statins: Increased risk of myopathy. Simvastatin- restrict dose to '20mg'$  daily. ?Other statins: counsel patients to report any muscle pain or weakness immediately. ? ?'[]'$  Anticoagulants: Amiodarone can increase anticoagulant effect. Consider warfarin dose reduction. Patients should be monitored closely and the dose of anticoagulant altered accordingly, remembering that amiodarone levels take several weeks to stabilize. ? ?'[]'$  Antiepileptics: Amiodarone can increase plasma concentration of phenytoin, the dose should be reduced. Note that small changes in phenytoin dose can result in large changes in levels. Monitor patient and counsel on signs of toxicity. ? ?'[]'$  Beta blockers: increased risk of bradycardia, AV block and myocardial depression. Sotalol - avoid concomitant use. ? ?'[]'$   Calcium channel blockers (diltiazem and verapamil): increased risk of bradycardia, AV block and myocardial depression. ? ?'[]'$   Cyclosporine: Amiodarone increases levels of cyclosporine. Reduced dose of cyclosporine is recommended. ? ?'[]'$  Digoxin dose should be halved when amiodarone is started. ? ?'[]'$  Diuretics: increased risk of cardiotoxicity if hypokalemia occurs. ? ?'[]'$  Oral hypoglycemic agents (glyburide, glipizide, glimepiride): increased risk of hypoglycemia. Patient's glucose levels should be monitored closely when initiating amiodarone therapy.  ? ?'[]'$  Drugs that prolong the QT interval:  Torsades de pointes risk may be increased with concurrent use -  avoid if possible.  Monitor QTc, also keep magnesium/potassium WNL if concurrent therapy can't be avoided. ? Antibiotics: e.g. fluoroquinolones, erythromycin. ? Antiarrhythmics: e.g. quinidine, procainamide, disopyramide, sotalol. ? Antipsychotics: e.g. phenothiazines, haloperidol. ?  Lithium, tricyclic antidepressants, and methadone. ? ?Thank You,  ?Gretta Arab PharmD, BCPS ?Clinical Pharmacist ?[facility] [phone] ?07/11/2021 3:08 PM ? ? ? ? ? ?

## 2021-07-11 NOTE — H&P (Signed)
? ?                                                                           TRH H&P ? ? ? Patient Demographics:  ? ? Marissa Powers, is a 81 y.o. female  MRN: [id number]  DOB - 09/24/1940 ? ?Admit Date - 07/11/2021 ? ?Referring MD/NP/PA: Dr. Ronnald Nian ? ?Outpatient Primary MD for the patient is Crist Infante, MD ? ?Patient coming from: Home ? ?Chief complaint-shortness of breath ? ? HPI:  ? ? Marissa Powers  is a 81 y.o. female, with history of CAD, polymyalgia rheumatica, PAF on Eliquis, hypertension, hyperlipidemia, hypothyroidism, severe aortic stenosis, undergoing work-up for TAVR who developed fever, shortness of breath yesterday.  At home she did COVID test which was positive.  She called her PCP who told her to go to the ED for further evaluation.  In the ED, patient is currently not requiring oxygen, however was found to be in A-fib with RVR with hypotension.  Cardiology was consulted for further recommendations.  Cardizem could not be started due to hypotension. ?Chest x-ray was clear, CRP was 6.5. ?Patient started on molnupiravir ?She is currently not requiring oxygen ? ?Denies chest pain ?Denies cough ?Denies dysuria or abdominal pain ?Denies fever ?Complains of nausea but no vomiting ?Denies diarrhea ? ? ? Review of systems:  ?  ?In addition to the HPI above,  ? ? ?All other systems reviewed and are negative. ? ? ? Past History of the following :  ? ? ?Past Medical History:  ?Diagnosis Date  ? Arthritis   ? osteoarthritis  ? Atrial fibrillation ([place])   ? CAD (coronary artery disease)   ? Dysplasia of cervix, low grade (CIN 1) 1990  ? HPV, Cryo, Laser  ? Fibroadenoma   ? Left, at 5 o'clock, not excised   ? Fibroid 2002  ? 1 cm, 2 cm  ? GERD (gastroesophageal reflux disease)   ? Hypercholesteremia   ? Hypertension   ? Hyperthyroidism   ? Severe aortic stenosis   ?   ? ?Past Surgical History:  ?Procedure Laterality Date  ? BREAST BIOPSY Bilateral 1970's  ? x2 one a side  ? CATARACT EXTRACTION    ? bilateral  ?  implantable loop recorder removal  05/30/2021  ? MDT reveal LINQ removed by Dr Rayann Heman  ? LOOP RECORDER INSERTION N/A 08/11/2017  ? Procedure: LOOP RECORDER INSERTION;  Surgeon: Thompson Grayer, MD;  Location: [facility];  Service: Cardiovascular;  Laterality: N/A;  ? RETINAL DETACHMENT SURGERY  2000  ? RIGHT/LEFT HEART CATH AND CORONARY ANGIOGRAPHY N/A 06/19/2021  ? Procedure: RIGHT/LEFT HEART CATH AND CORONARY ANGIOGRAPHY;  Surgeon: Belva Crome, MD;  Location: [facility];  Service: Cardiovascular;  Laterality: N/A;  ? THYROIDECTOMY    ? TONSILLECTOMY    ? TOTAL KNEE ARTHROPLASTY  04/25/2012  ? Procedure: TOTAL KNEE BILATERAL;  Surgeon: Gearlean Alf, MD;  Location: [facility];  Service: Orthopedics;  Laterality: Bilateral;  ? ? ? ? Social History:  ? ? ?  ?Social History  ? ?Tobacco Use  ? Smoking status: Never  ?  Passive exposure: Never  ? Smokeless tobacco: Never  ?Substance Use Topics  ?  Alcohol use: Yes  ?  Alcohol/week: 4.0 - 6.0 standard drinks  ?  Types: 4 - 6 Standard drinks or equivalent per week  ?  ? ? ? Family History :  ? ?  ?Family History  ?Problem Relation Age of Onset  ? Breast cancer Maternal Grandmother   ? Heart attack Maternal Grandmother   ? Aortic aneurysm Mother   ? Clotting disorder Mother   ? Breast cancer Maternal Aunt 74  ? Heart disease Brother   ?     stints  ? Healthy Father   ? ? ? ? Home Medications:  ? ?Prior to Admission medications   ?Medication Sig Start Date End Date Taking? Authorizing Provider  ?acetaminophen (TYLENOL) 500 MG tablet Take 1,000 mg by mouth every 6 (six) hours as needed (for pain.).    [provider]  ?amoxicillin (AMOXIL) 500 MG capsule Take 2,000 mg by mouth See admin instructions. Take 4 capsules (2000 mg) by mouth 1 hour prior to dental appointments 02/19/19   [provider]  ?apixaban (ELIQUIS) 5 MG TABS tablet Take 1 tablet (5 mg total) by mouth 2 (two) times daily. Resume Apixaban at 8 PM 06/19/2921 06/19/21   Belva Crome,  MD  ?B-D TB SYRINGE 1CC/27GX1/2" 27G X 1/2" 1 ML MISC Inject into the skin once a week. 08/19/20   [provider]  ?Calcium Carb-Cholecalciferol (CALCIUM + D3 PO) Take 1 tablet by mouth in the morning.    [provider]  ?cholecalciferol (VITAMIN D3) 25 MCG (1000 UNIT) tablet Take 1,000 Units by mouth in the morning.    [provider]  ?Cyanocobalamin (B-12 PO) Take 1,000 mcg by mouth daily.    [provider]  ?denosumab (PROLIA) 60 MG/ML SOSY injection Inject 60 mg into the skin every 6 (six) months.    [provider]  ?dicyclomine (BENTYL) 10 MG capsule Take 10 mg by mouth 3 (three) times daily as needed (abdominal cramps). 02/09/21   [provider]  ?folic acid (FOLVITE) 1 MG tablet Take 2 mg by mouth in the morning.    [provider]  ?levothyroxine (SYNTHROID, LEVOTHROID) 88 MCG tablet Take 88 mcg by mouth daily before breakfast.    [provider]  ?Loperamide HCl (IMODIUM A-D PO) Take by mouth.    [provider]  ?Methotrexate 25 MG/ML SOSY Inject 15 mg into the skin every Wednesday. 0.6 ml    [provider]  ?Methotrexate Sodium (METHOTREXATE, PF,) 50 MG/2ML injection SMARTSIG:1 Milliliter(s) SUB-Q Once a Week 06/24/21   [provider]  ?metoprolol succinate (TOPROL-XL) 25 MG 24 hr tablet Take 25 mg by mouth every evening.    [provider]  ?metoprolol tartrate (LOPRESSOR) 25 MG tablet Take 1 tablet (25 mg total) by mouth every 6 (six) hours as needed (heart racing). 08/28/19 06/17/23  Fenton, Clint R, PA  ?mometasone (ELOCON) 0.1 % ointment Apply topically to skin once weekly ?Patient taking differently: Apply 1 application. topically daily as needed (skin irritation.). 12/31/20   Megan Salon, MD  ?nitroGLYCERIN (NITROSTAT) 0.4 MG SL tablet Place 1 tablet (0.4 mg total) under the tongue every 5 (five) minutes as needed for chest pain. 07/05/20   Belva Crome, MD  ?pantoprazole (PROTONIX) 40  MG tablet Take 40 mg by mouth in the morning and at bedtime.    [provider]  ?predniSONE (DELTASONE) 1 MG tablet Take 2 mg by mouth daily with breakfast.    [provider]  ?telmisartan (MICARDIS) 20 MG tablet Take 20 mg by mouth every evening. 11/22/19   [provider]  ?zolpidem (AMBIEN) 5 MG tablet Take 2.5 mg by mouth at bedtime as needed for sleep.     [provider]  ? ? ? Allergies:  ? ?  ?Allergies  ?Allergen Reactions  ? Cashew Nut Oil Other (See Comments) and Anaphylaxis  ?  Tingley, swelling ?Tingley, swelling  ? ? ? Physical Exam:  ? ?Vitals ? ?Blood pressure 111/74, pulse (!) 40, temperature 99.7 ?F (37.6 ?C), temperature source Oral, resp. rate 20, height 5' 5.5" (1.664 m), weight 85 kg, SpO2 93 %. ? ?1.  General: ?Appears in no acute distress ? ?2. Psychiatric: ?Alert, oriented x3, intact insight and judgment ? ?3. Neurologic: ?Cranial nerves II through XII grossly intact, no focal deficit noted ? ?4. HEENMT:  ?Atraumatic normocephalic, extraocular muscles are intact ? ?5. Respiratory : ?Clear to auscultation bilaterally ? ?6. Cardiovascular : ?S1-S2, regular, no murmur auscultated ? ?7. Gastrointestinal:  ?Abdomen is soft, nontender, no organomegaly ? ? ? ? ? Data Review:  ? ? CBC ?Recent Labs  ?Lab 07/11/21 ?0945  ?WBC 9.6  ?HGB 11.8*  ?HCT 35.2*  ?PLT 223  ?MCV 106.0*  ?MCH 35.5*  ?MCHC 33.5  ?RDW 13.8  ?LYMPHSABS 0.8  ?MONOABS 1.0  ?EOSABS 0.0  ?BASOSABS 0.0  ? ?------------------------------------------------------------------------------------------------------------------ ? ?Results for orders placed or performed during the hospital encounter of 07/11/21 (from the past 48 hour(s))  ?Lactic acid, plasma     Status: None  ? Collection Time: 07/11/21  9:45 AM  ?Result Value Ref Range  ? Lactic Acid, Venous 1.7 0.5 - 1.9 mmol/L  ?  Comment: Performed at [facility], [place] [address] [place], [place] [address]  ?CBC WITH DIFFERENTIAL      Status: Abnormal  ? Collection Time: 07/11/21  9:45 AM  ?Result Value Ref Range  ? WBC 9.6 4.0 - 10.5 K/uL  ? RBC 3.32 (L) 3.87 - 5.11 MIL/uL  ? Hemoglobin 11.8 (L) 12.0 - 15.0 g/dL  ? HCT 35.2 (L) 36.0

## 2021-07-11 NOTE — Consult Note (Addendum)
? ?The patient has been seen in conjunction with Almyra Deforest, PAC. All aspects of care have been considered and discussed. The patient has been personally interviewed, examined, and all clinical data has been reviewed. ? ?Acute COVID-19 infection with systemic complaints including fatigue, low-grade temperature, dyspnea with diagnosis confirmed by home testing and hospital PCR. ?High risk situation in this elderly patient with polymyalgia rheumatica, immunosuppressive therapy, severe aortic stenosis and prediabetes.  She has history of paroxysmal atrial fibrillation and was in atrial fib with rapid ventricular response when presenting.  Thankfully, there is not severe coronary disease. ?She is in no acute distress.  The typical murmur of aortic stenosis is noted.  No JVD is noted.  No peripheral edema. ?CONCLUSIONS: Paroxysmal atrial fibrillation in the setting of acute infection.  In atrial fibrillation, no evidence of heart failure and has responded to IV fluid. ?PLAN: Plan IV amiodarone to pharmacologically convert and stabilize rhythm through this illness.  If rapid ventricular response with protracted decrease in blood pressure, will require urgent electrical cardioversion.  Would like to get amiodarone started to prevent recurrent A-fib once she is converted.  I will recommend continuing amiodarone orally which will make TAVR procedure go more smoothly.  There is a history of baseline bradycardia so with IV amiodarone we need to take care not to slow heart rate too much.  May need to decrease or eliminate metoprolol based upon sinus rate. Continue anticoagulation therapy to protect against thrombophilia associated with COVID-19 and atrial fibrillation.  Start antiviral therapy. ? ? ? ?Cardiology Consultation:  ? ?Patient ID: Marissa Powers ?MRN: [id number]; DOB: Nov 01, 1940 ? ?Admit date: 07/11/2021 ?Date of Consult: 07/11/2021 ? ?PCP:  Crist Infante, MD ?  ?[facility] Providers ?Cardiologist:  Sinclair Grooms,  MD  ?Electrophysiologist:  Thompson Grayer, MD   ?Structural heart clinic: Dr. Angelena Form ?CT surgery: Dr. Cyndia Bent ? ? ?Patient Profile:  ? ?Marissa Powers is a 81 y.o. female with a hx of CAD, polymyalgia rheumatica, PAF on Eliquis, HTN, HLD, hypothyroidism and severe AS undergoing work up for TAVR who is being seen 07/11/2021 for the evaluation of atrial fibrillation with RVR in the setting of COVID 19 infection at the request of Dr. Ronnald Nian. ? ?History of Present Illness:  ? ?Marissa Powers is a pleasant 81 year old female with past medical history of CAD, polymyalgia rheumatica, PAF on Eliquis, HTN, HLD, hypothyroidism and severe AS undergoing work up for TAVR.  She had a loop recorder placed in 2019 for palpitation, this revealed paroxysmal atrial fibrillation. Loop recorder was explanted in Feb 2023 after battery depleted. Last interrogation prior to explant shows very low afib burden of 0.4%.  Echocardiogram obtained on 06/09/2021 revealed EF 60 to 65%, grade 2 DD, no regional wall motion abnormality, severe aortic stenosis with mean gradient 40 mmHg, mild MR.  Patient was last seen by Dr. Tamala Julian on 06/13/2021 at which time she described worsening dyspnea on exertion.  Patient eventually underwent a left and right heart cath on 06/19/2021 that revealed severe aortic stenosis with mean transvalvular gradient of 50.3 mmHg, calculated aortic valve area of 0.89 cm?, widely patent left main, LAD, left circumflex and RCA with 70 to 80% lesion in first diagonal.  Medical therapy was recommended.  Patient was referred to structural heart clinic for evaluation TAVR.  Patient was seen by Dr. Angelena Form on 06/23/2021 who felt she is a TAVR candidate.  She underwent a CT of the chest abdomen pelvis on 3/20 and currently scheduled to see  Dr. Cyndia Bent of cardiothoracic surgery on 5/3. ? ?Patient was in her usual state of health until yesterday 07/10/2021 when she had low-grade fever in the high 90s, muscle ache and worsening shortness of breath  with minimal exertion.  This morning, she obtained a home COVID test which came back positive.  She sought medical attention at [facility] ED.  On initial arrival, heart rate was in the 150s.  EKG demonstrated atrial fibrillation with RVR.  Blood pressure was low in the 80s.  Patient was given IV fluid.  Her last dose of Toprol-XL was last night.  Last dose of Eliquis was this morning.  Talking with the patient, she denies any recent chest pain.  She has good cardiac awareness of atrial fibrillation with palpitation however the muscle ache, fatigue, worsening shortness of breath and low-grade fever were new since yesterday.  She denies ever having fatigue and worsening shortness of breath previously when she had atrial fibrillation before. ? ? ?Past Medical History:  ?Diagnosis Date  ? Arthritis   ? osteoarthritis  ? Atrial fibrillation ([place])   ? CAD (coronary artery disease)   ? Dysplasia of cervix, low grade (CIN 1) 1990  ? HPV, Cryo, Laser  ? Fibroadenoma   ? Left, at 5 o'clock, not excised   ? Fibroid 2002  ? 1 cm, 2 cm  ? GERD (gastroesophageal reflux disease)   ? Hypercholesteremia   ? Hypertension   ? Hyperthyroidism   ? Severe aortic stenosis   ? ? ?Past Surgical History:  ?Procedure Laterality Date  ? BREAST BIOPSY Bilateral 1970's  ? x2 one a side  ? CATARACT EXTRACTION    ? bilateral  ? implantable loop recorder removal  05/30/2021  ? MDT reveal LINQ removed by Dr Rayann Heman  ? LOOP RECORDER INSERTION N/A 08/11/2017  ? Procedure: LOOP RECORDER INSERTION;  Surgeon: Thompson Grayer, MD;  Location: [facility];  Service: Cardiovascular;  Laterality: N/A;  ? RETINAL DETACHMENT SURGERY  2000  ? RIGHT/LEFT HEART CATH AND CORONARY ANGIOGRAPHY N/A 06/19/2021  ? Procedure: RIGHT/LEFT HEART CATH AND CORONARY ANGIOGRAPHY;  Surgeon: Belva Crome, MD;  Location: [facility];  Service: Cardiovascular;  Laterality: N/A;  ? THYROIDECTOMY    ? TONSILLECTOMY    ? TOTAL KNEE ARTHROPLASTY  04/25/2012  ? Procedure:  TOTAL KNEE BILATERAL;  Surgeon: Gearlean Alf, MD;  Location: [facility];  Service: Orthopedics;  Laterality: Bilateral;  ?  ? ?Home Medications:  ?Prior to Admission medications   ?Medication Sig Start Date End Date Taking? Authorizing Provider  ?acetaminophen (TYLENOL) 500 MG tablet Take 1,000 mg by mouth every 6 (six) hours as needed (for pain.).    [provider]  ?amoxicillin (AMOXIL) 500 MG capsule Take 2,000 mg by mouth See admin instructions. Take 4 capsules (2000 mg) by mouth 1 hour prior to dental appointments 02/19/19   [provider]  ?apixaban (ELIQUIS) 5 MG TABS tablet Take 1 tablet (5 mg total) by mouth 2 (two) times daily. Resume Apixaban at 8 PM 06/19/2921 06/19/21   Belva Crome, MD  ?B-D TB SYRINGE 1CC/27GX1/2" 27G X 1/2" 1 ML MISC Inject into the skin once a week. 08/19/20   [provider]  ?Calcium Carb-Cholecalciferol (CALCIUM + D3 PO) Take 1 tablet by mouth in the morning.    [provider]  ?cholecalciferol (VITAMIN D3) 25 MCG (1000 UNIT) tablet Take 1,000 Units by mouth in the morning.    [provider]  ?Cyanocobalamin (  B-12 PO) Take 1,000 mcg by mouth daily.    [provider]  ?denosumab (PROLIA) 60 MG/ML SOSY injection Inject 60 mg into the skin every 6 (six) months.    [provider]  ?dicyclomine (BENTYL) 10 MG capsule Take 10 mg by mouth 3 (three) times daily as needed (abdominal cramps). 02/09/21   [provider]  ?folic acid (FOLVITE) 1 MG tablet Take 2 mg by mouth in the morning.    [provider]  ?levothyroxine (SYNTHROID, LEVOTHROID) 88 MCG tablet Take 88 mcg by mouth daily before breakfast.    [provider]  ?Loperamide HCl (IMODIUM A-D PO) Take by mouth.    [provider]  ?Methotrexate 25 MG/ML SOSY Inject 15 mg into the skin every Wednesday. 0.6 ml    [provider]  ?Methotrexate Sodium (METHOTREXATE, PF,) 50 MG/2ML injection SMARTSIG:1 Milliliter(s) SUB-Q Once a  Week 06/24/21   [provider]  ?metoprolol succinate (TOPROL-XL) 25 MG 24 hr tablet Take 25 mg by mouth every evening.    [provider]  ?metoprolol tartrate (LOPRESSOR) 25 MG tabl

## 2021-07-11 NOTE — ED Provider Notes (Addendum)
?[facility] DEPT ?Provider Note ? ? ?CSN: [id number] ?Arrival date & time: 07/11/21  4403 ? ?  ? ?History ? ?Chief Complaint  ?Patient presents with  ? Shortness of Breath  ? ? ?Marissa Powers is a 81 y.o. female. ? ?The history is provided by the patient.  ?Shortness of Breath ?Severity:  Moderate ?Onset quality:  Gradual ?Duration:  2 days ?Timing:  Intermittent ?Progression:  Unchanged ?Chronicity:  New ?Context: URI (covid positive this morning)   ?Relieved by:  Rest ?Worsened by:  Exertion ?Associated symptoms: cough and fever   ?Associated symptoms: no abdominal pain, no chest pain, no claudication, no diaphoresis, no sore throat and no sputum production   ?Risk factors comment:  Hx of afib on eliquis, valvular heart dx ? ?  ? ?Home Medications ?Prior to Admission medications   ?Medication Sig Start Date End Date Taking? Authorizing Provider  ?acetaminophen (TYLENOL) 500 MG tablet Take 1,000 mg by mouth every 6 (six) hours as needed (for pain.).    [provider]  ?amoxicillin (AMOXIL) 500 MG capsule Take 2,000 mg by mouth See admin instructions. Take 4 capsules (2000 mg) by mouth 1 hour prior to dental appointments 02/19/19   [provider]  ?apixaban (ELIQUIS) 5 MG TABS tablet Take 1 tablet (5 mg total) by mouth 2 (two) times daily. Resume Apixaban at 8 PM 06/19/2921 06/19/21   Belva Crome, MD  ?B-D TB SYRINGE 1CC/27GX1/2" 27G X 1/2" 1 ML MISC Inject into the skin once a week. 08/19/20   [provider]  ?Calcium Carb-Cholecalciferol (CALCIUM + D3 PO) Take 1 tablet by mouth in the morning.    [provider]  ?cholecalciferol (VITAMIN D3) 25 MCG (1000 UNIT) tablet Take 1,000 Units by mouth in the morning.    [provider]  ?Cyanocobalamin (B-12 PO) Take 1,000 mcg by mouth daily.    [provider]  ?denosumab (PROLIA) 60 MG/ML SOSY injection Inject 60 mg into the skin every 6 (six) months.    [provider]   ?dicyclomine (BENTYL) 10 MG capsule Take 10 mg by mouth 3 (three) times daily as needed (abdominal cramps). 02/09/21   [provider]  ?folic acid (FOLVITE) 1 MG tablet Take 2 mg by mouth in the morning.    [provider]  ?levothyroxine (SYNTHROID, LEVOTHROID) 88 MCG tablet Take 88 mcg by mouth daily before breakfast.    [provider]  ?Loperamide HCl (IMODIUM A-D PO) Take by mouth.    [provider]  ?Methotrexate 25 MG/ML SOSY Inject 15 mg into the skin every Wednesday. 0.6 ml    [provider]  ?Methotrexate Sodium (METHOTREXATE, PF,) 50 MG/2ML injection SMARTSIG:1 Milliliter(s) SUB-Q Once a Week 06/24/21   [provider]  ?metoprolol succinate (TOPROL-XL) 25 MG 24 hr tablet Take 25 mg by mouth every evening.    [provider]  ?metoprolol tartrate (LOPRESSOR) 25 MG tablet Take 1 tablet (25 mg total) by mouth every 6 (six) hours as needed (heart racing). 08/28/19 06/17/23  Fenton, Clint R, PA  ?mometasone (ELOCON) 0.1 % ointment Apply topically to skin once weekly ?Patient taking differently: Apply 1 application. topically daily as needed (skin irritation.). 12/31/20   Megan Salon, MD  ?nitroGLYCERIN (NITROSTAT) 0.4 MG SL tablet Place 1 tablet (0.4 mg total) under the tongue every 5 (five) minutes as needed for chest pain. 07/05/20   Belva Crome, MD  ?pantoprazole (PROTONIX) 40 MG tablet Take 40 mg  by mouth in the morning and at bedtime.    [provider]  ?predniSONE (DELTASONE) 1 MG tablet Take 2 mg by mouth daily with breakfast.    [provider]  ?telmisartan (MICARDIS) 20 MG tablet Take 20 mg by mouth every evening. 11/22/19   [provider]  ?zolpidem (AMBIEN) 5 MG tablet Take 2.5 mg by mouth at bedtime as needed for sleep.     [provider]  ?   ? ?Allergies    ?Cashew nut oil   ? ?Review of Systems   ?Review of Systems  ?Constitutional:  Positive for fever. Negative for diaphoresis.  ?HENT:   Negative for sore throat.   ?Respiratory:  Positive for cough and shortness of breath. Negative for sputum production.   ?Cardiovascular:  Negative for chest pain and claudication.  ?Gastrointestinal:  Negative for abdominal pain.  ? ?Physical Exam ?Updated Vital Signs ?BP 93/71   Pulse (!) 120   Temp 99.7 ?F (37.6 ?C) (Oral)   Resp (!) 27   Ht 5' 5.5" (1.664 m)   Wt 85 kg   SpO2 96%   BMI 30.71 kg/m?  ?Physical Exam ?Vitals and nursing note reviewed.  ?Constitutional:   ?   General: She is not in acute distress. ?   Appearance: She is well-developed. She is not ill-appearing.  ?HENT:  ?   Head: Normocephalic and atraumatic.  ?   Mouth/Throat:  ?   Mouth: Mucous membranes are moist.  ?Eyes:  ?   Conjunctiva/sclera: Conjunctivae normal.  ?   Pupils: Pupils are equal, round, and reactive to light.  ?Cardiovascular:  ?   Rate and Rhythm: Normal rate and regular rhythm.  ?   Pulses: Normal pulses.  ?   Heart sounds: Normal heart sounds. No murmur heard. ?Pulmonary:  ?   Effort: Pulmonary effort is normal. No respiratory distress.  ?   Breath sounds: Normal breath sounds. No decreased breath sounds or wheezing.  ?Abdominal:  ?   Palpations: Abdomen is soft.  ?   Tenderness: There is no abdominal tenderness.  ?Musculoskeletal:     ?   General: No swelling.  ?   Cervical back: Normal range of motion and neck supple.  ?   Right lower leg: No edema.  ?   Left lower leg: No edema.  ?Skin: ?   General: Skin is warm and dry.  ?   Capillary Refill: Capillary refill takes less than 2 seconds.  ?Neurological:  ?   Mental Status: She is alert.  ?Psychiatric:     ?   Mood and Affect: Mood normal.  ? ? ?ED Results / Procedures / Treatments   ?Labs ?(all labs ordered are listed, but only abnormal results are displayed) ?Labs Reviewed  ?RESP PANEL BY RT-PCR (FLU A&B, COVID) ARPGX2 - Abnormal; Notable for the following components:  ?    Result Value  ? SARS Coronavirus 2 by RT PCR POSITIVE (*)   ? All other components within  normal limits  ?CBC WITH DIFFERENTIAL/PLATELET - Abnormal; Notable for the following components:  ? RBC 3.32 (*)   ? Hemoglobin 11.8 (*)   ? HCT 35.2 (*)   ? MCV 106.0 (*)   ? MCH 35.5 (*)   ? Neutro Abs 7.8 (*)   ? All other components within normal limits  ?COMPREHENSIVE METABOLIC PANEL - Abnormal; Notable for the following components:  ? Sodium 133 (*)   ? Glucose, Bld 145 (*)   ? All  other components within normal limits  ?C-REACTIVE PROTEIN - Abnormal; Notable for the following components:  ? CRP 6.5 (*)   ? All other components within normal limits  ?CULTURE, BLOOD (ROUTINE X 2)  ?CULTURE, BLOOD (ROUTINE X 2)  ?LACTIC ACID, PLASMA  ?LACTIC ACID, PLASMA  ?D-DIMER, QUANTITATIVE  ?PROCALCITONIN  ?LACTATE DEHYDROGENASE  ?FERRITIN  ?TRIGLYCERIDES  ?FIBRINOGEN  ? ? ?EKG ?EKG Interpretation ? ?Date/Time:  Friday July 11 2021 09:14:28 EDT ?Ventricular Rate:  157 ?PR Interval:    ?QRS Duration: 87 ?QT Interval:  303 ?QTC Calculation: 490 ?R Axis:   6 ?Text Interpretation: Atrial fibrillation with rapid V-rate ST depression, probably rate related Confirmed by Lennice Sites ([id number]) on 07/11/2021 9:42:03 AM ? ?Radiology ?DG Chest Portable 1 View ? ?Result Date: 07/11/2021 ?CLINICAL DATA:  Shortness of breath, COVID positive EXAM: PORTABLE CHEST 1 VIEW COMPARISON:  Chest x-ray dated March 28, 2012 FINDINGS: Cardiac and mediastinal contours are within normal limits. Mild left basilar opacities, likely due to atelectasis. Lungs otherwise clear. No large pleural effusion or pneumothorax. IMPRESSION: No active disease. Electronically Signed   By: Yetta Glassman M.D.   On: 07/11/2021 09:36   ? ?Procedures ?Marland KitchenCritical Care ?Performed by: Lennice Sites, DO ?Authorized by: Lennice Sites, DO  ? ?Critical care provider statement:  ?  Critical care time (minutes):  35 ?  Critical care was necessary to treat or prevent imminent or life-threatening deterioration of the following conditions: atrial fibrillation with rvr. ?  Critical  care was time spent personally by me on the following activities:  Discussions with consultants, development of treatment plan with patient or surrogate, blood draw for specimens, discussions with primary provider, evalua

## 2021-07-12 LAB — COMPREHENSIVE METABOLIC PANEL
ALT: 13 U/L (ref 0–44)
AST: 17 U/L (ref 15–41)
Albumin: 3.6 g/dL (ref 3.5–5.0)
Alkaline Phosphatase: 42 U/L (ref 38–126)
Anion gap: 5 (ref 5–15)
BUN: 11 mg/dL (ref 8–23)
CO2: 25 mmol/L (ref 22–32)
Calcium: 8.9 mg/dL (ref 8.9–10.3)
Chloride: 111 mmol/L (ref 98–111)
Creatinine, Ser: 0.72 mg/dL (ref 0.44–1.00)
GFR, Estimated: >60 mL/min (ref >60)
Glucose, Bld: 164 mg/dL — ABNORMAL HIGH (ref 70–99)
Potassium: 4.1 mmol/L (ref 3.5–5.1)
Sodium: 141 mmol/L (ref 135–145)
Total Bilirubin: 0.6 mg/dL (ref 0.3–1.2)
Total Protein: 6.7 g/dL (ref 6.5–8.1)

## 2021-07-12 LAB — CBC WITH DIFFERENTIAL/PLATELET
Abs Immature Granulocytes: 0.02 10*3/uL (ref 0.00–0.07)
Basophils Absolute: 0 10*3/uL (ref 0.0–0.1)
Basophils Relative: 0 %
Eosinophils Absolute: 0 10*3/uL (ref 0.0–0.5)
Eosinophils Relative: 0 %
HCT: 31.9 % — ABNORMAL LOW (ref 36.0–46.0)
Hemoglobin: 10.2 g/dL — ABNORMAL LOW (ref 12.0–15.0)
Immature Granulocytes: 0 %
Lymphocytes Relative: 7 %
Lymphs Abs: 0.3 10*3/uL — ABNORMAL LOW (ref 0.7–4.0)
MCH: 34.9 pg — ABNORMAL HIGH (ref 26.0–34.0)
MCHC: 32 g/dL (ref 30.0–36.0)
MCV: 109.2 fL — ABNORMAL HIGH (ref 80.0–100.0)
Monocytes Absolute: 0.1 10*3/uL (ref 0.1–1.0)
Monocytes Relative: 2 %
Neutro Abs: 4.8 10*3/uL (ref 1.7–7.7)
Neutrophils Relative %: 91 %
Platelets: 179 10*3/uL (ref 150–400)
RBC: 2.92 MIL/uL — ABNORMAL LOW (ref 3.87–5.11)
RDW: 13.7 % (ref 11.5–15.5)
WBC: 5.2 10*3/uL (ref 4.0–10.5)
nRBC: 0 % (ref 0.0–0.2)

## 2021-07-12 LAB — C-REACTIVE PROTEIN: CRP: 12.4 mg/dL — ABNORMAL HIGH (ref <1.0)

## 2021-07-12 LAB — D-DIMER, QUANTITATIVE: D-Dimer, Quant: <0.27 ug/mL-FEU (ref 0.00–0.50)

## 2021-07-12 LAB — FERRITIN: Ferritin: 88 ng/mL (ref 11–307)

## 2021-07-12 NOTE — Progress Notes (Signed)
? ?  Progress Note ? ?Patient Name: Marissa Powers ?Date of Encounter: 07/12/2021 ? ?[facility] Cardiologist: Sinclair Grooms, MD  ? ?Telemetry personally reviewed.  Patient has converted to sinus rhythm.  Would continue IV amiodarone.  Patient remains in sinus rhythm will transition to oral amiodarone tomorrow.  This will be continued at discharge as well as outlined by Dr. Tamala Julian.  Continue apixaban.  Patient also with history of severe aortic stenosis and TAVR work-up has been initiated.  She will follow-up with Dr. Cyndia Bent after discharge.  Continue therapy for COVID per primary service. ? ?For questions or updates, please contact [place] ?Please consult [email or web address] for contact info under  ? ?  ?   ?Signed, ?Kirk Ruths, MD  ?07/12/2021, 9:01 AM   ? ?

## 2021-07-12 NOTE — TOC Initial Note (Signed)
Transition of Care (TOC) - Initial/Assessment Note  ? ? ?Patient Details  ?Name: Marissa Powers ?MRN: [id number] ?Date of Birth: 16-Jan-1941 ? ?Transition of Care (TOC) CM/SW Contact:    ?Tawanna Cooler, RN ?Phone Number: ?07/12/2021, 12:09 PM ? ?Clinical Narrative:                 ? ?Patient from home.  Is covid-19+.  Currently on room air, has been on 2L O2 at times.  TOC following for potential discharge needs.  ? ? ?Expected Discharge Plan: [place] ?Barriers to Discharge: Continued Medical Work up ? ?Expected Discharge Plan and Services ?Expected Discharge Plan: [place] ?  ?   ?Living arrangements for the past 2 months: [place] ?                ?  ?Prior Living Arrangements/Services ?Living arrangements for the past 2 months: [place] ?Lives with:: Self ?Patient language and need for interpreter reviewed:: Yes ?       ?Need for Family Participation in Patient Care: Yes (Comment) ?Care giver support system in place?: Yes (comment) ?  ?Criminal Activity/Legal Involvement Pertinent to Current Situation/Hospitalization: No - Comment as needed ? ?  ?Alcohol / Substance Use: Alcohol Use ?Psych Involvement: No (comment) ? ?Admission diagnosis:  Dehydration [E86.0] ?Atrial fibrillation with RVR ([place]) [I48.91] ?COVID-19 virus infection [U07.1] ?COVID-19 [U07.1] ?Patient Active Problem List  ? Diagnosis Date Noted  ? COVID-19 virus infection 07/11/2021  ? Severe aortic stenosis 06/19/2021  ? Secondary hypercoagulable state ([place]) 08/28/2019  ? Pain in joint of left shoulder 09/20/2018  ? Coronary artery disease of native artery of native heart with stable angina pectoris ([place]) 09/15/2018  ? Paroxysmal atrial fibrillation ([place]) 10/04/2017  ? Lichen sclerosus 72/12/4707  ?  Class: History of  ? Hypertension   ? GERD (gastroesophageal reflux disease)   ? Arthritis   ? Hypercholesteremia   ? OA (osteoarthritis) of knee 04/25/2012  ? Fibroid 2002  ? Dysplasia of cervix,  low grade (CIN 1) 1990  ? ?PCP:  Crist Infante, MD ?Pharmacy:   ?[id number] - [place], [address] [place] ?[address] [place] ?[place] [address] ?Phone: [phone] Fax: [phone] ? ? ? ? ?

## 2021-07-12 NOTE — Progress Notes (Signed)
[facility] ? ?PROGRESS NOTE ? ?Marissa Powers IOX:[id number] DOB: 07/27/1940 DOA: 07/11/2021 ?PCP: Crist Infante, MD ? ? ?Brief HPI:   ? ? 81 y.o. female, with history of CAD, polymyalgia rheumatica, PAF on Eliquis, hypertension, hyperlipidemia, hypothyroidism, severe aortic stenosis, undergoing work-up for TAVR who developed fever, shortness of breath yesterday.  At home she did COVID test which was positive.  She called her PCP who told her to go to the ED for further evaluation.  In the ED, patient is currently not requiring oxygen, however was found to be in A-fib with RVR with hypotension.  Cardiology was consulted for further recommendations.  Cardizem could not be started due to hypotension. ?Chest x-ray was clear, CRP was 6.5. ?Patient started on molnupiravir ?She is currently not requiring oxygen ? ? ?Subjective  ? ?Patient seen and examined, denies any complaints.  She was started on IV amiodarone GGT, now converted back to normal sinus rhythm. ? ? Assessment/Plan:  ? ? ?COVID-19 viral infection ?-Asymptomatic, not requiring oxygen ?-CRP is up to 12.4 ?-Started on molnupiravir for 5 days ?-Started on Decadron 6 mg p.o. daily ? ?Atrial fibrillation with RVR ?-Converted to normal sinus rhythm after started on amiodarone gtt. ?-Cardiology plans to switch to p.o. amiodarone tomorrow ?-And then discharged home on p.o. amiodarone ?-Continue anticoagulation with Eliquis ? ?Severe aortic stenosis ?-Followed by cardiology as outpatient ?-Plan for TAVR in the near future ? ?Hypothyroidism ?-Continue Synthroid ? ?Polymyalgia rheumatica ?-Patient on prednisone and methotrexate at home ?-Prednisone on hold, started on Decadron 6 mg daily ? ?Macrocytic anemia ?-Patient on B12 and folate  supplementation as outpatient ?-Continue B12 and folate in the hospital ? ?GERD ?-Continue Protonix ? ?Hypertension ?-Metoprolol and Micardis on hold due to hypotension ?-Cardiology does not plan to restart metoprolol at  discharge ? ?Vitamin D deficiency ?-Continue vitamin D supplementation ? ? ?Medications ? ?  ? apixaban  5 mg Oral BID  ? Chlorhexidine Gluconate Cloth  6 each Topical Daily  ? cholecalciferol  1,000 Units Oral q AM  ? dexamethasone  6 mg Oral Q68T  ? folic acid  2 mg Oral q AM  ? levothyroxine  88 mcg Oral QAC breakfast  ? molnupiravir EUA  4 capsule Oral BID  ? pantoprazole  40 mg Oral Daily  ? vitamin B-12  1,000 mcg Oral Daily  ? ? ? Data Reviewed:  ? ?CBG: ? ?No results for input(s): GLUCAP in the last 168 hours. ? ?SpO2: (!) 89 % ?O2 Flow Rate (L/min): 2 L/min  ? ? ?Vitals:  ? 07/12/21 0800 07/12/21 0810 07/12/21 0900 07/12/21 1000  ?BP: 122/76  130/60 (!) 109/54  ?Pulse: 63  68 71  ?Resp: 18  (!) 22 (!) 21  ?Temp:  98.7 ?F (37.1 ?C)    ?TempSrc:  Oral    ?SpO2: 98%  92% (!) 89%  ?Weight:      ?Height:      ? ? ? ? ?Data Reviewed: ? ?Basic Metabolic Panel: ?Recent Labs  ?Lab 07/11/21 ?0945 07/12/21 ?0257  ?NA 133* 141  ?K 3.6 4.1  ?CL 102 111  ?CO2 22 25  ?GLUCOSE 145* 164*  ?BUN 12 11  ?CREATININE 0.85 0.72  ?CALCIUM 9.3 8.9  ? ? ?CBC: ?Recent Labs  ?Lab 07/11/21 ?0945 07/12/21 ?0257  ?WBC 9.6 5.2  ?NEUTROABS 7.8* 4.8  ?HGB 11.8* 10.2*  ?HCT 35.2* 31.9*  ?MCV 106.0* 109.2*  ?PLT 223 179  ? ? ?LFT ?Recent Labs  ?Lab 07/11/21 ?[place]  07/12/21 ?0257  ?AST 21 17  ?ALT 14 13  ?ALKPHOS 50 42  ?BILITOT 1.0 0.6  ?PROT 7.5 6.7  ?ALBUMIN 4.1 3.6  ? ?  ?Antibiotics: ?Anti-infectives (From admission, onward)  ? ? Start     Dose/Rate Route Frequency Ordered Stop  ? 07/11/21 1200  molnupiravir EUA (LAGEVRIO) capsule 800 mg       ? 4 capsule Oral 2 times daily 07/11/21 1153 07/16/21 0959  ? 07/11/21 1000  nirmatrelvir/ritonavir EUA (PAXLOVID) 3 tablet  Status:  Discontinued       ? 3 tablet Oral 2 times daily 07/11/21 9983 07/11/21 1056  ? ?  ? ? ? ?DVT prophylaxis: Eliquis ? ?Code Status: Full code ? ?Family Communication: No family at bedside ? ? ?CONSULTS cardiology ? ? ?Objective  ? ? ?Physical  Examination: ? ?General-appears in no acute distress ?Heart-S1-S2, regular, no murmur auscultated ?Lungs-clear to auscultation bilaterally, no wheezing or crackles auscultated ?Abdomen-soft, nontender, no organomegaly ?Extremities-no edema in the lower extremities ?Neuro-alert, oriented x3, no focal deficit noted ? ? ? ?Status is: Inpatient: COVID-19 infection, atrial fibrillation ? ? ? ?  ? ? ?Oswald Hillock ?  ?[facility] ?If 7PM-7AM, please contact night-coverage at [email or web address], ?Office  [phone] ? ? ?07/12/2021, 11:17 AM  LOS: 1 day  ? ? ? ? ? ? ? ? ? ? ?  ?

## 2021-07-13 LAB — COMPREHENSIVE METABOLIC PANEL
ALT: 14 U/L (ref 0–44)
AST: 20 U/L (ref 15–41)
Albumin: 3.6 g/dL (ref 3.5–5.0)
Alkaline Phosphatase: 39 U/L (ref 38–126)
Anion gap: 6 (ref 5–15)
BUN: 15 mg/dL (ref 8–23)
CO2: 24 mmol/L (ref 22–32)
Calcium: 8.7 mg/dL — ABNORMAL LOW (ref 8.9–10.3)
Chloride: 110 mmol/L (ref 98–111)
Creatinine, Ser: 0.81 mg/dL (ref 0.44–1.00)
GFR, Estimated: >60 mL/min (ref >60)
Glucose, Bld: 161 mg/dL — ABNORMAL HIGH (ref 70–99)
Potassium: 4.2 mmol/L (ref 3.5–5.1)
Sodium: 140 mmol/L (ref 135–145)
Total Bilirubin: 0.6 mg/dL (ref 0.3–1.2)
Total Protein: 7 g/dL (ref 6.5–8.1)

## 2021-07-13 LAB — CBC WITH DIFFERENTIAL/PLATELET
Abs Immature Granulocytes: 0.01 10*3/uL (ref 0.00–0.07)
Basophils Absolute: 0 10*3/uL (ref 0.0–0.1)
Basophils Relative: 0 %
Eosinophils Absolute: 0 10*3/uL (ref 0.0–0.5)
Eosinophils Relative: 0 %
HCT: 31.7 % — ABNORMAL LOW (ref 36.0–46.0)
Hemoglobin: 10.4 g/dL — ABNORMAL LOW (ref 12.0–15.0)
Immature Granulocytes: 0 %
Lymphocytes Relative: 7 %
Lymphs Abs: 0.4 10*3/uL — ABNORMAL LOW (ref 0.7–4.0)
MCH: 35.9 pg — ABNORMAL HIGH (ref 26.0–34.0)
MCHC: 32.8 g/dL (ref 30.0–36.0)
MCV: 109.3 fL — ABNORMAL HIGH (ref 80.0–100.0)
Monocytes Absolute: 0.1 10*3/uL (ref 0.1–1.0)
Monocytes Relative: 1 %
Neutro Abs: 5.2 10*3/uL (ref 1.7–7.7)
Neutrophils Relative %: 92 %
Platelets: 203 10*3/uL (ref 150–400)
RBC: 2.9 MIL/uL — ABNORMAL LOW (ref 3.87–5.11)
RDW: 13.9 % (ref 11.5–15.5)
WBC: 5.7 10*3/uL (ref 4.0–10.5)
nRBC: 0 % (ref 0.0–0.2)

## 2021-07-13 LAB — D-DIMER, QUANTITATIVE: D-Dimer, Quant: <0.27 ug/mL-FEU (ref 0.00–0.50)

## 2021-07-13 LAB — C-REACTIVE PROTEIN: CRP: 9 mg/dL — ABNORMAL HIGH (ref <1.0)

## 2021-07-13 LAB — FERRITIN: Ferritin: 113 ng/mL (ref 11–307)

## 2021-07-13 MED ORDER — AMIODARONE HCL 200 MG PO TABS
200.0000 mg | ORAL_TABLET | Freq: Two times a day (BID) | ORAL | Status: DC
  Administered 2021-07-13: 200 mg via ORAL
  Filled 2021-07-13: qty 1

## 2021-07-13 MED ORDER — AMIODARONE HCL 200 MG PO TABS: 200.0000 mg | ORAL_TABLET | Freq: Every day | ORAL | 2 refills | Status: DC

## 2021-07-13 MED ORDER — MOLNUPIRAVIR EUA 200MG CAPSULE: 4.0000 | ORAL_CAPSULE | Freq: Two times a day (BID) | ORAL | 0 refills | Status: DC

## 2021-07-13 NOTE — Progress Notes (Signed)
Patient was discharged from the facility. Discharge instructions were reviewed and the patient did not have any questions/concerns. All personal belongings were accounted for and packed by the patient in personal belongings bag. Patient's IV was discontinued and removed. Patient was wheeled downstairs by Broadus John, RN without complication and released to family member via transportation by car. No further action at this time.  ?

## 2021-07-13 NOTE — Progress Notes (Signed)
Per Dr. Alvan Dame to give Dexamethasone 6 mg prior to discharge.  ?

## 2021-07-13 NOTE — Discharge Summary (Signed)
?Physician Discharge Summary ?  ?Patient: Marissa Powers MRN: [id number] DOB: 08-May-1940  ?Admit date:     07/11/2021  ?Discharge date: 07/13/21  ?Discharge Physician: Oswald Hillock  ? ?PCP: Crist Infante, MD  ? ?Recommendations at discharge:  ? ?Follow-up PCP in 2 weeks ?Follow-up cardiology as outpatient ? ?Discharge Diagnoses: ?Principal Problem: ?  COVID-19 virus infection ? ?Resolved Problems: ?  * No resolved hospital problems. * ? ?Hospital Course: ?81 y.o. female, with history of CAD, polymyalgia rheumatica, PAF on Eliquis, hypertension, hyperlipidemia, hypothyroidism, severe aortic stenosis, undergoing work-up for TAVR who developed fever, shortness of breath yesterday.  At home she did COVID test which was positive.  She called her PCP who told her to go to the ED for further evaluation.  In the ED, patient is currently not requiring oxygen, however was found to be in A-fib with RVR with hypotension.  Cardiology was consulted for further recommendations.  Cardizem could not be started due to hypotension. ?Chest x-ray was clear, CRP was 6.5. ?Patient started on molnupiravir ?She is currently not requiring oxygen ? ?Assessment and Plan: ? ?COVID-19 viral infection ?-Asymptomatic, not requiring oxygen ?-CRP is down to 9.0 ?-Started on molnupiravir for 5 days; she will continue taking molnupiravir at home ?-Started on Decadron 6 mg p.o. daily, has received 3 days of Decadron in the hospital ?-Did not require oxygen, she can continue taking prednisone 2 mg daily her usual dose at home ?  ?Atrial fibrillation with RVR ?-Converted to normal sinus rhythm after started on amiodarone gtt. ?-Started on amiodarone 200 mg p.o. twice daily for 1 week and then continue taking 200 mg daily ?-Continue anticoagulation with Eliquis ?-Metoprolol has been discontinued as per cardiology ?  ?Severe aortic stenosis ?-Followed by cardiology as outpatient ?-Plan for TAVR in the near future ?  ?Hypothyroidism ?-Continue Synthroid ?   ?Polymyalgia rheumatica ?-Patient on prednisone and methotrexate at home ? ?  ?Macrocytic anemia ?-Patient on B12 and folate  supplementation as outpatient ?-Continue B12 and folate  ?  ?GERD ?-Continue Protonix ?  ?Hypertension ?-Metoprolol and Micardis on hold due to hypotension ?-Cardiology does not plan to restart metoprolol at discharge.  Called and discussed with Dr. Stanford Breed. ?-Blood pressure is now elevated, restart Micardis ?  ?Vitamin D deficiency ?-Continue vitamin D supplementation ? ?  ? ? ?Consultants: Cardiology ?Procedures performed:  ?Disposition: Home ?Diet recommendation:  ?Discharge Diet Orders (From admission, onward)  ? ?  Start     Ordered  ? 07/13/21 0000  Diet - low sodium heart healthy       ? 07/13/21 1116  ? ?  ?  ? ?  ? ?Cardiac diet ?DISCHARGE MEDICATION: ?Allergies as of 07/13/2021   ? ?   Reactions  ? Cashew Nut Oil Other (See Comments), Anaphylaxis  ? [place], swelling ?Tingley, swelling  ? Other   ? Other reaction(s): Facial Swelling  ? ?  ? ?  ?Medication List  ?  ? ?STOP taking these medications   ? ?metoprolol succinate 25 MG 24 hr tablet ?Commonly known as: TOPROL-XL ?  ?metoprolol tartrate 25 MG tablet ?Commonly known as: LOPRESSOR ?  ? ?  ? ?TAKE these medications   ? ?acetaminophen 500 MG tablet ?Commonly known as: TYLENOL ?Take 500 mg by mouth daily. ?  ?amiodarone 200 MG tablet ?Commonly known as: PACERONE ?Take 1 tablet (200 mg total) by mouth daily. Take Amiodarone 200 mg po BID for one week till 07/19/21, then continue Amiodarone 200 mg po  daily ?  ?amoxicillin 500 MG capsule ?Commonly known as: AMOXIL ?Take 2,000 mg by mouth See admin instructions. Take 4 capsules (2000 mg) by mouth 1 hour prior to dental appointments ?  ?apixaban 5 MG Tabs tablet ?Commonly known as: Eliquis ?Take 1 tablet (5 mg total) by mouth 2 (two) times daily. Resume Apixaban at 8 PM 06/19/2921 ?  ?B-12 PO ?Take 1,000 mcg by mouth daily. ?  ?B-D TB SYRINGE 1CC/27GX1/2" 27G X 1/2" 1 ML Misc ?Generic  drug: TUBERCULIN SYR 1CC/27GX1/2" ?Inject into the skin once a week. ?  ?CALCIUM + D3 PO ?Take 1 tablet by mouth in the morning. ?  ?cholecalciferol 25 MCG (1000 UNIT) tablet ?Commonly known as: VITAMIN D3 ?Take 1,000 Units by mouth in the morning. ?  ?denosumab 60 MG/ML Sosy injection ?Commonly known as: PROLIA ?Inject 60 mg into the skin every 6 (six) months. ?  ?folic acid 1 MG tablet ?Commonly known as: FOLVITE ?Take 2 mg by mouth in the morning. ?  ?IMODIUM A-D PO ?Take 4 mg by mouth daily. ?  ?levothyroxine 88 MCG tablet ?Commonly known as: SYNTHROID ?Take 88 mcg by mouth daily before breakfast. ?  ?Methotrexate 25 MG/ML Sosy ?Inject 1 mL into the skin every Wednesday. ?  ?molnupiravir EUA 200 mg Caps capsule ?Commonly known as: LAGEVRIO ?Take 4 capsules (800 mg total) by mouth 2 (two) times daily for 5 days. ?  ?mometasone 0.1 % ointment ?Commonly known as: ELOCON ?Apply topically to skin once weekly ?What changed:  ?how much to take ?how to take this ?when to take this ?reasons to take this ?additional instructions ?  ?nitroGLYCERIN 0.4 MG SL tablet ?Commonly known as: NITROSTAT ?Place 1 tablet (0.4 mg total) under the tongue every 5 (five) minutes as needed for chest pain. ?  ?pantoprazole 40 MG tablet ?Commonly known as: PROTONIX ?Take 40 mg by mouth in the morning and at bedtime. ?  ?predniSONE 1 MG tablet ?Commonly known as: DELTASONE ?Take 3 mg by mouth daily with breakfast. continuous ?  ?telmisartan 20 MG tablet ?Commonly known as: MICARDIS ?Take 20 mg by mouth every evening. ?  ?zolpidem 5 MG tablet ?Commonly known as: AMBIEN ?Take 2.5 mg by mouth at bedtime as needed for sleep. ?  ? ?  ? ? ?Discharge Exam: ?Filed Weights  ? 07/11/21 0912 07/11/21 1818  ?Weight: 85 kg 85.5 kg  ? ?General-appears in no acute distress ?Heart-S1-S2, regular, no murmur auscultated ?Lungs-clear to auscultation bilaterally, no wheezing or crackles auscultated ?Abdomen-soft, nontender, no organomegaly ?Extremities-no edema  in the lower extremities ?Neuro-alert, oriented x3, no focal deficit noted ? ?Condition at discharge: good ? ?The results of significant diagnostics from this hospitalization (including imaging, microbiology, ancillary and laboratory) are listed below for reference.  ? ?Imaging Studies: ?CARDIAC CATHETERIZATION ? ?Result Date: 06/19/2021 ?CONCLUSIONS: Severe aortic stenosis with mean transvalvular gradient 50.3 mmHg, and calculated aortic valve area 0.89 cm?. Widely patent left main, LAD, circumflex, and right coronary.  First diagonal contains 70 to 80% mid RCA. Mild pulmonary hypertension, mean PA pressure 24 mmHg.  Mean wedge pressure 14 mmHg (likely inaccurate) LVEDP 21 mmHg.  WHO group 2. PA arterial saturation 75%.  Aortic arterial saturation 99%.  Cardiac output 6.63 L/min. RECOMMENDATIONS: Severe aortic stenosis and will be referred to Dr. Angelena Form in the structural heart clinic to begin work-up for TAVR. Risk factor modification for minor coronary atherosclerosis as has been occurring over the past 3 years.  ? ?CT CORONARY MORPH W/CTA COR W/SCORE W/CA  W/CM &/OR WO/CM ? ?Addendum Date: 06/30/2021   ?ADDENDUM REPORT: 06/30/2021 16:43 CLINICAL DATA:  Aortic valve replacement (TAVR), pre-op eval EXAM: Cardiac TAVR CT TECHNIQUE: The patient was scanned on a Siemens Force 786 slice scanner. A 120 kV retrospective scan was triggered in the descending thoracic aorta at 111 HU's. Gantry rotation speed was 270 msecs and collimation was .9 mm. The 3D data set was reconstructed in 5% intervals of the R-R cycle. Systolic and diastolic phases were analyzed on a dedicated work station using MPR, MIP and VRT modes. The patient received 178m OMNIPAQUE IOHEXOL 350 MG/ML SOLN of contrast. FINDINGS: Aortic Valve: Tricuspid aortic valve. Severely reduced cusp separation. Severely thickened, severely calcified aortic valve cusps. AV calcium score: 1428 Virtual Basal Annulus Measurements: Maximum/Minimum Diameter: 22.8 x 20.7 mm  Perimeter: 67.5 mm Area: 357 mm2 No significant LVOT calcifications. Based on these measurements, the annulus would be suitable for a 23 mm Sapien 3 valve. Sinus of Valsalva Measurements: Non-coronary:  28 mm R

## 2021-07-13 NOTE — Progress Notes (Signed)
? ?  Progress Note ? ?Patient Name: Marissa Powers ?Date of Encounter: 07/13/2021 ? ?[facility] Cardiologist: Sinclair Grooms, MD  ? ?Telemetry personally reviewed.  The patient remains in sinus rhythm today.  We will discontinue IV amiodarone and treat with amiodarone 200 mg by mouth twice daily for 1 week then 200 mg daily thereafter.  Continue apixaban.  As outlined previously she has severe aortic stenosis and TAVR work-up has been initiated.  She will follow-up with Dr. Cyndia Bent as scheduled.  Continue therapy for COVID per primary service. ? ?For questions or updates, please contact [place] ?Please consult [email or web address] for contact info under  ? ?  ?   ?Signed, ?Kirk Ruths, MD  ?07/13/2021, 8:22 AM   ? ?

## 2021-07-15 ENCOUNTER — Telehealth: Payer: Self-pay | Admitting: Interventional Cardiology

## 2021-07-15 NOTE — Telephone Encounter (Signed)
I spoke with the pt and she was recently in the hospital for her Afib with RVR and she was sent home with some med changes:  ? ?Atrial fibrillation with RVR ?-Converted to normal sinus rhythm after started on amiodarone gtt. ?-Started on amiodarone 200 mg p.o. twice daily for 1 week and then continue taking 200 mg daily ?-Continue anticoagulation with Eliquis ?-Metoprolol has been discontinued as per cardiology ?-Metoprolol and Micardis on hold due to hypotension ?-Cardiology does not plan to restart metoprolol at discharge.  Called and discussed with Dr. Stanford Breed. ?-Blood pressure is now elevated, restart Micardis ?  ? ?She is asking to see Dr. Tamala Julian this week per her instructions on discharge... she has been feeling much better since being sent home... I advised her that I will forward to Dr. Thompson Caul nurse to review his schedule.   ? ? ? ? ? ?

## 2021-07-15 NOTE — Telephone Encounter (Signed)
Patient calling to speak with the nurse, she just got out the hospital for afib. Her medication was change and just want to discuss that. ?

## 2021-07-15 NOTE — Progress Notes (Addendum)
?Cardiology Office Note:   ? ?Date:  07/16/2021  ? ?ID:  Marissa Powers, DOB 1940-07-05, MRN [id number] ? ?PCP:  Crist Infante, MD  ?Cardiologist:  Sinclair Grooms, MD  ? ?Referring MD: Crist Infante, MD  ? ?Chief Complaint  ?Patient presents with  ? Atrial Fibrillation  ? Cardiac Valve Problem  ?  Calcific aortic stenosis  ? Hypertension  ? ? ?History of Present Illness:   ? ?Marissa Powers is a 81 y.o. 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 Covid - 19 infection complicated by PAF requiring amiodarone. ? ? ?Marissa Powers is here in follow-up after spending the last week in in the hospital with upper respiratory symptoms and COVID-19 diagnosis.  During the brief hospital stay she was noted to be in atrial fibrillation rapid ventricular response.  IV amiodarone was started and led to conversion.  The decision was made to leave her on amiodarone until she gets through her valve replacement via TAVR.  Hopefully this will be done sometime in May.  When she is several days 2 weeks out from [place] will be discontinued.  She denies shortness of breath.  She was not aware that she was in atrial fibrillation.  She has not had chest pain or syncope. ? ?Stressed out today because she just had mammography and found out that there is a left breast lump and a lymph node.  This will be an ongoing stressor and need management. ? ?Past Medical History:  ?Diagnosis Date  ? Arthritis   ? osteoarthritis  ? Atrial fibrillation ([place])   ? CAD (coronary artery disease)   ? Dysplasia of cervix, low grade (CIN 1) 1990  ? HPV, Cryo, Laser  ? Fibroadenoma   ? Left, at 5 o'clock, not excised   ? Fibroid 2002  ? 1 cm, 2 cm  ? GERD (gastroesophageal reflux disease)   ? Hypercholesteremia   ? Hypertension   ? Hyperthyroidism   ? Severe aortic stenosis   ? ? ?Past  Surgical History:  ?Procedure Laterality Date  ? BREAST BIOPSY Bilateral 1970's  ? x2 one a side  ? CATARACT EXTRACTION    ? bilateral  ? implantable loop recorder removal  05/30/2021  ? MDT reveal LINQ removed by Dr Rayann Heman  ? LOOP RECORDER INSERTION N/A 08/11/2017  ? Procedure: LOOP RECORDER INSERTION;  Surgeon: Thompson Grayer, MD;  Location: [facility];  Service: Cardiovascular;  Laterality: N/A;  ? RETINAL DETACHMENT SURGERY  2000  ? RIGHT/LEFT HEART CATH AND CORONARY ANGIOGRAPHY N/A 06/19/2021  ? Procedure: RIGHT/LEFT HEART CATH AND CORONARY ANGIOGRAPHY;  Surgeon: Belva Crome, MD;  Location: [facility];  Service: Cardiovascular;  Laterality: N/A;  ? THYROIDECTOMY    ? TONSILLECTOMY    ? TOTAL KNEE ARTHROPLASTY  04/25/2012  ? Procedure: TOTAL KNEE BILATERAL;  Surgeon: Gearlean Alf, MD;  Location: [facility];  Service: Orthopedics;  Laterality: Bilateral;  ? ? ?Current Medications: ?Current Meds  ?Medication Sig  ? acetaminophen (TYLENOL) 500 MG tablet Take 500 mg by mouth daily.  ? amiodarone (PACERONE) 200 MG tablet Take 1 tablet (200 mg total) by mouth daily. Take Amiodarone 200 mg po BID for one week till 07/19/21, then continue Amiodarone 200 mg po daily  ? amoxicillin (AMOXIL) 500 MG capsule Take 2,000 mg by mouth See admin instructions.  Take 4 capsules (2000 mg) by mouth 1 hour prior to dental appointments  ? apixaban (ELIQUIS) 5 MG TABS tablet Take 1 tablet (5 mg total) by mouth 2 (two) times daily. Resume Apixaban at 8 PM 06/19/2921  ? B-D TB SYRINGE 1CC/27GX1/2" 27G X 1/2" 1 ML MISC Inject into the skin once a week.  ? Calcium Carb-Cholecalciferol (CALCIUM + D3 PO) Take 1 tablet by mouth in the morning.  ? cholecalciferol (VITAMIN D3) 25 MCG (1000 UNIT) tablet Take 1,000 Units by mouth in the morning.  ? Cyanocobalamin (B-12 PO) Take 1,000 mcg by mouth daily.  ? denosumab (PROLIA) 60 MG/ML SOSY injection Inject 60 mg into the skin every 6 (six) months.  ? folic acid (FOLVITE) 1 MG tablet Take  2 mg by mouth in the morning.  ? levothyroxine (SYNTHROID, LEVOTHROID) 88 MCG tablet Take 88 mcg by mouth daily before breakfast.  ? Loperamide HCl (IMODIUM A-D PO) Take 4 mg by mouth daily.  ? Methotrexate 25 MG/ML SOSY Inject 1 mL into the skin every Wednesday.  ? mometasone (ELOCON) 0.1 % ointment Apply topically to skin once weekly (Patient taking differently: Apply 1 application. topically daily as needed (skin irritation.).)  ? nitroGLYCERIN (NITROSTAT) 0.4 MG SL tablet Place 1 tablet (0.4 mg total) under the tongue every 5 (five) minutes as needed for chest pain.  ? pantoprazole (PROTONIX) 40 MG tablet Take 40 mg by mouth 2 (two) times daily as needed.  ? predniSONE (DELTASONE) 1 MG tablet Take 3 mg by mouth daily with breakfast. continuous  ? telmisartan (MICARDIS) 20 MG tablet Take 20 mg by mouth every evening.  ? zolpidem (AMBIEN) 5 MG tablet Take 2.5 mg by mouth at bedtime as needed for sleep.   ?  ? ?Allergies:   Cashew nut oil and Other  ? ?Social History  ? ?Socioeconomic History  ? Marital status: Widowed  ?  Spouse name: Not on file  ? Number of children: 2  ? Years of education: Not on file  ? Highest education level: Not on file  ?Occupational History  ? Not on file  ?Tobacco Use  ? Smoking status: Never  ?  Passive exposure: Never  ? Smokeless tobacco: Never  ?Substance and Sexual Activity  ? Alcohol use: Yes  ?  Alcohol/week: 4.0 - 6.0 standard drinks  ?  Types: 4 - 6 Standard drinks or equivalent per week  ? Drug use: No  ? Sexual activity: Not Currently  ?  Partners: Male  ?  Birth control/protection: Post-menopausal  ?Other Topics Concern  ? Not on file  ?Social History Narrative  ? Not on file  ? ?Social Determinants of Health  ? ?Financial Resource Strain: Not on file  ?Food Insecurity: Not on file  ?Transportation Needs: Not on file  ?Physical Activity: Not on file  ?Stress: Not on file  ?Social Connections: Not on file  ?  ? ?Family History: ?The patient's family history includes Aortic  aneurysm in her mother; Breast cancer in her maternal grandmother; Breast cancer (age of onset: 65) in her maternal aunt; Clotting disorder in her mother; Healthy in her father; Heart attack in her maternal grandmother; Heart disease in her brother. ? ?ROS:   ?Please see the history of present illness.    ?Anxiety concerning the multiple health issues going on at this time.  She is concerned about being on amiodarone because of side effect profile.  All other systems reviewed and are negative. ? ?EKGs/Labs/Other Studies Reviewed:   ? ?  The following studies were reviewed today: ?No new clinical data ? ?EKG:  EKG not repeated and on exam she is in sinus rhythm. ? ?Recent Labs: ?07/13/2021: ALT 14; BUN 15; Creatinine, Ser 0.81; Hemoglobin 10.4; Platelets 203; Potassium 4.2; Sodium 140  ?Recent Lipid Panel ?   ?Component Value Date/Time  ? CHOL 122 10/28/2018 0832  ? TRIG 100 07/11/2021 0945  ? HDL 55 10/28/2018 0832  ? CHOLHDL 2.2 10/28/2018 0832  ? [place] 45 10/28/2018 0832  ? ? ?Physical Exam:   ? ?VS:  BP (!) 178/98   Pulse 67   Ht '5\' 5"'$  (1.651 m)   Wt 182 lb 12.8 oz (82.9 kg)   SpO2 97%   BMI 30.42 kg/m?    ? ?Wt Readings from Last 3 Encounters:  ?07/16/21 182 lb 12.8 oz (82.9 kg)  ?07/11/21 188 lb 7.9 oz (85.5 kg)  ?06/23/21 186 lb 9.6 oz (84.6 kg)  ?  ? ?GEN: Normal weight. No acute distress ?HEENT: Normal ?NECK: No JVD. ?LYMPHATICS: No lymphadenopathy ?CARDIAC: Unchanged 3/6 to 4/6 crescendo decrescendo systolic murmur of aortic stenosis.  No diastolic murmur. RRR S4 but no S3 gallop, or edema. ?VASCULAR:  Normal Pulses. No bruits. ?RESPIRATORY:  Clear to auscultation without rales, wheezing or rhonchi  ?ABDOMEN: Soft, non-tender, non-distended, No pulsatile mass, ?MUSCULOSKELETAL: No deformity  ?SKIN: Warm and dry ?NEUROLOGIC:  Alert and oriented x 3 ?PSYCHIATRIC:  Normal affect  ? ?ASSESSMENT:   ? ?1. Severe aortic stenosis   ?2. Coronary artery disease of native artery of native heart with stable angina  pectoris ([place])   ?3. Paroxysmal atrial fibrillation ([place])   ?4. Acquired thrombophilia ([place])   ?5. Primary hypertension   ?6. Hypercholesteremia   ? ?PLAN:   ? ?In order of problems listed above: ? ?Severe a

## 2021-07-15 NOTE — Telephone Encounter (Signed)
Spoke with pt and scheduled her to see Dr. Tamala Julian 07/16/21.  Pt appreciative for call.  ?

## 2021-07-16 ENCOUNTER — Ambulatory Visit (INDEPENDENT_AMBULATORY_CARE_PROVIDER_SITE_OTHER): Payer: Medicare Other | Admitting: Interventional Cardiology

## 2021-07-16 ENCOUNTER — Encounter: Payer: Self-pay | Admitting: Interventional Cardiology

## 2021-07-16 VITALS — BP 178/98 | HR 67 | Ht 65.0 in | Wt 182.8 lb

## 2021-07-16 DIAGNOSIS — I350 Nonrheumatic aortic (valve) stenosis: Secondary | ICD-10-CM | POA: Diagnosis not present

## 2021-07-16 DIAGNOSIS — I25118 Atherosclerotic heart disease of native coronary artery with other forms of angina pectoris: Secondary | ICD-10-CM | POA: Diagnosis not present

## 2021-07-16 DIAGNOSIS — E7800 Pure hypercholesterolemia, unspecified: Secondary | ICD-10-CM | POA: Diagnosis not present

## 2021-07-16 DIAGNOSIS — I480 Paroxysmal atrial fibrillation: Secondary | ICD-10-CM

## 2021-07-16 DIAGNOSIS — R928 Other abnormal and inconclusive findings on diagnostic imaging of breast: Secondary | ICD-10-CM | POA: Diagnosis not present

## 2021-07-16 DIAGNOSIS — I10 Essential (primary) hypertension: Secondary | ICD-10-CM

## 2021-07-16 DIAGNOSIS — D6869 Other thrombophilia: Secondary | ICD-10-CM

## 2021-07-16 DIAGNOSIS — N6323 Unspecified lump in the left breast, lower outer quadrant: Secondary | ICD-10-CM | POA: Diagnosis not present

## 2021-07-16 LAB — CULTURE, BLOOD (ROUTINE X 2)
Culture: NO GROWTH
Culture: NO GROWTH

## 2021-07-16 MED ORDER — AMIODARONE HCL 200 MG PO TABS: 200.0000 mg | ORAL_TABLET | Freq: Every day | ORAL | 3 refills | Status: DC

## 2021-07-16 MED ORDER — METOPROLOL SUCCINATE ER 25 MG PO TB24: 25.0000 mg | ORAL_TABLET | Freq: Every day | ORAL | 3 refills | Status: DC

## 2021-07-16 NOTE — Patient Instructions (Signed)
Medication Instructions:  ?1) RESTART Metoprolol Succinate to '25mg'$  once daily ?2) DECREASE Amiodarone to '200mg'$  once daily ? ?*If you need a refill on your cardiac medications before your next appointment, please call your pharmacy* ? ? ?Lab Work: ?None ?If you have labs (blood work) drawn today and your tests are completely normal, you will receive your results only by: ?MyChart Message (if you have MyChart) OR ?A paper copy in the mail ?If you have any lab test that is abnormal or we need to change your treatment, we will call you to review the results. ? ? ?Testing/Procedures: ?None ? ? ?Follow-Up: ?Plan to see Dr. Tamala Julian back after you have had your TAVR completed ? ? ?Other Instructions ?  ?

## 2021-07-17 ENCOUNTER — Ambulatory Visit (INDEPENDENT_AMBULATORY_CARE_PROVIDER_SITE_OTHER): Payer: Medicare Other | Admitting: Obstetrics & Gynecology

## 2021-07-17 ENCOUNTER — Encounter (HOSPITAL_BASED_OUTPATIENT_CLINIC_OR_DEPARTMENT_OTHER): Payer: Self-pay | Admitting: Obstetrics & Gynecology

## 2021-07-17 ENCOUNTER — Other Ambulatory Visit (HOSPITAL_COMMUNITY)
Admission: RE | Admit: 2021-07-17 | Discharge: 2021-07-17 | Disposition: A | Discharge status: Home / Self Care | Payer: Medicare Other | Source: Ambulatory Visit | Attending: Obstetrics & Gynecology | Admitting: Obstetrics & Gynecology

## 2021-07-17 VITALS — BP 141/76 | HR 56 | Ht 65.5 in | Wt 182.4 lb

## 2021-07-17 DIAGNOSIS — M353 Polymyalgia rheumatica: Secondary | ICD-10-CM | POA: Diagnosis not present

## 2021-07-17 DIAGNOSIS — L292 Pruritus vulvae: Secondary | ICD-10-CM

## 2021-07-17 DIAGNOSIS — Z124 Encounter for screening for malignant neoplasm of cervix: Secondary | ICD-10-CM | POA: Insufficient documentation

## 2021-07-17 DIAGNOSIS — L900 Lichen sclerosus et atrophicus: Secondary | ICD-10-CM | POA: Diagnosis not present

## 2021-07-17 DIAGNOSIS — Z01419 Encounter for gynecological examination (general) (routine) without abnormal findings: Secondary | ICD-10-CM | POA: Diagnosis not present

## 2021-07-17 DIAGNOSIS — Z87410 Personal history of cervical dysplasia: Secondary | ICD-10-CM

## 2021-07-17 DIAGNOSIS — I350 Nonrheumatic aortic (valve) stenosis: Secondary | ICD-10-CM

## 2021-07-17 DIAGNOSIS — I25118 Atherosclerotic heart disease of native coronary artery with other forms of angina pectoris: Secondary | ICD-10-CM | POA: Diagnosis not present

## 2021-07-17 DIAGNOSIS — N870 Mild cervical dysplasia: Secondary | ICD-10-CM

## 2021-07-17 MED ORDER — NYSTATIN 100000 UNIT/GM EX CREA: 1.0000 "application " | TOPICAL_CREAM | Freq: Two times a day (BID) | CUTANEOUS | 1 refills | Status: DC

## 2021-07-17 MED ORDER — MOMETASONE FUROATE 0.1 % EX OINT: TOPICAL_OINTMENT | CUTANEOUS | 1 refills | Status: DC

## 2021-07-17 NOTE — Progress Notes (Signed)
81 y.o. G36P2012 Widowed White or Caucasian female here for breast and pelvic exam.  Has recently had Covid.  This put her into afib.  She is on amiodarone now for this.  Has consult with thoracic surgeon in early May.  Will likely have valve replacement.  She also recently had an abnormal mammogram.  She is having a biopsy tomorrow.   ? ?Denies vaginal bleeding but is having a lot of pelvic cramping/discomfort.  Has looser stools, no diarrhea.  Very soft.  Had colonoscopy 2021 that was normal.  Saw Dr. Cristina Gong but he has retired.  She feels these changes started after being on steroids.  Is better but not resolved. ? ?Cardiologist:  Dr. Tamala Julian ?Rheumatologist:  Dr. Amil Amen ? ?No LMP recorded. Patient is postmenopausal.          ? ?Health Maintenance: ?PCP:  Dr. Joylene Draft.  Last wellness appt was about six months ago.  Did blood work at that appt:   ?Vaccines are up to date:  yes ?Colonoscopy:  2021.  Normal.   ?MMG:  06/28/2020 Negative, 2023 mammogram was abnormal.  Having biopsy tomorrow.   ?BMD:  08/26/2016 ?Last pap smear:  05/04/2018 Negative.   ?H/o abnormal pap smear:  yes ? ? ? reports that she has never smoked. She has never been exposed to tobacco smoke. She has never used smokeless tobacco. She reports current alcohol use of about 4.0 - 6.0 standard drinks per week. She reports that she does not use drugs. ? ?Past Medical History:  ?Diagnosis Date  ? Arthritis   ? osteoarthritis  ? Atrial fibrillation ([place])   ? CAD (coronary artery disease)   ? Dysplasia of cervix, low grade (CIN 1) 1990  ? HPV, Cryo, Laser  ? Fibroadenoma   ? Left, at 5 o'clock, not excised   ? Fibroid 2002  ? 1 cm, 2 cm  ? GERD (gastroesophageal reflux disease)   ? Hypercholesteremia   ? Hypertension   ? Hyperthyroidism   ? Severe aortic stenosis   ? ? ?Past Surgical History:  ?Procedure Laterality Date  ? BREAST BIOPSY Bilateral 1970's  ? x2 one a side  ? CATARACT EXTRACTION    ? bilateral  ? implantable loop recorder removal  05/30/2021   ? MDT reveal LINQ removed by Dr Rayann Heman  ? LOOP RECORDER INSERTION N/A 08/11/2017  ? Procedure: LOOP RECORDER INSERTION;  Surgeon: Thompson Grayer, MD;  Location: [facility];  Service: Cardiovascular;  Laterality: N/A;  ? RETINAL DETACHMENT SURGERY  2000  ? RIGHT/LEFT HEART CATH AND CORONARY ANGIOGRAPHY N/A 06/19/2021  ? Procedure: RIGHT/LEFT HEART CATH AND CORONARY ANGIOGRAPHY;  Surgeon: Belva Crome, MD;  Location: [facility];  Service: Cardiovascular;  Laterality: N/A;  ? THYROIDECTOMY    ? TONSILLECTOMY    ? TOTAL KNEE ARTHROPLASTY  04/25/2012  ? Procedure: TOTAL KNEE BILATERAL;  Surgeon: Gearlean Alf, MD;  Location: [facility];  Service: Orthopedics;  Laterality: Bilateral;  ? ? ?Current Outpatient Medications  ?Medication Sig Dispense Refill  ? acetaminophen (TYLENOL) 500 MG tablet Take 500 mg by mouth daily.    ? amiodarone (PACERONE) 200 MG tablet Take 1 tablet (200 mg total) by mouth daily. 90 tablet 3  ? amoxicillin (AMOXIL) 500 MG capsule Take 2,000 mg by mouth See admin instructions. Take 4 capsules (2000 mg) by mouth 1 hour prior to dental appointments    ? apixaban (ELIQUIS) 5 MG TABS tablet Take 1 tablet (5 mg total) by mouth  2 (two) times daily. Resume Apixaban at 8 PM 06/19/2921 60 tablet 11  ? B-D TB SYRINGE 1CC/27GX1/2" 27G X 1/2" 1 ML MISC Inject into the skin once a week.    ? Calcium Carb-Cholecalciferol (CALCIUM + D3 PO) Take 1 tablet by mouth in the morning.    ? cholecalciferol (VITAMIN D3) 25 MCG (1000 UNIT) tablet Take 1,000 Units by mouth in the morning.    ? Cyanocobalamin (B-12 PO) Take 1,000 mcg by mouth daily.    ? denosumab (PROLIA) 60 MG/ML SOSY injection Inject 60 mg into the skin every 6 (six) months.    ? folic acid (FOLVITE) 1 MG tablet Take 2 mg by mouth in the morning.    ? levothyroxine (SYNTHROID, LEVOTHROID) 88 MCG tablet Take 88 mcg by mouth daily before breakfast.    ? Loperamide HCl (IMODIUM A-D PO) Take 4 mg by mouth daily.    ? Methotrexate 25 MG/ML SOSY  Inject 1 mL into the skin every Wednesday.    ? metoprolol succinate (TOPROL XL) 25 MG 24 hr tablet Take 1 tablet (25 mg total) by mouth daily. 90 tablet 3  ? mometasone (ELOCON) 0.1 % ointment Apply topically to skin once weekly (Patient taking differently: Apply 1 application. topically daily as needed (skin irritation.).) 45 g 1  ? nitroGLYCERIN (NITROSTAT) 0.4 MG SL tablet Place 1 tablet (0.4 mg total) under the tongue every 5 (five) minutes as needed for chest pain. 25 tablet 3  ? predniSONE (DELTASONE) 1 MG tablet Take 3 mg by mouth daily with breakfast. continuous    ? telmisartan (MICARDIS) 20 MG tablet Take 20 mg by mouth every evening.    ? zolpidem (AMBIEN) 5 MG tablet Take 2.5 mg by mouth at bedtime as needed for sleep.     ? pantoprazole (PROTONIX) 40 MG tablet Take 40 mg by mouth 2 (two) times daily as needed. (Patient not taking: Reported on 07/17/2021)    ? ?No current facility-administered medications for this visit.  ? ? ?Family History  ?Problem Relation Age of Onset  ? Breast cancer Maternal Grandmother   ? Heart attack Maternal Grandmother   ? Aortic aneurysm Mother   ? Clotting disorder Mother   ? Breast cancer Maternal Aunt 57  ? Heart disease Brother   ?     stints  ? Healthy Father   ? ? ?Review of Systems  ?All other systems reviewed and are negative. ? ?Exam:   ?BP (!) 141/76 (BP Location: Left Arm, Patient Position: Sitting, Cuff Size: Large)   Pulse (!) 56   Ht 5' 5.5" (1.664 m) Comment: reported  Wt 182 lb 6.4 oz (82.7 kg)   BMI 29.89 kg/m?   Height: 5' 5.5" (166.4 cm) (reported) ? ?General appearance: alert, cooperative and appears stated age ?Breasts: left breast mass that is about 3cm at 4 o'clock, non tender, no axillary LAD noted, no nipple discharge; right breast without masses, skin changes, nipple discharge ?Abdomen: soft, non-tender; bowel sounds normal; no masses,  no organomegaly ?Lymph nodes: Cervical, supraclavicular, and axillary nodes normal.  No abnormal inguinal nodes  palpated ?Neurologic: Grossly normal ? ?Pelvic: External genitalia:  no lesions ?             Urethra:  normal appearing urethra with no masses, tenderness or lesions ?             Bartholins and Skenes: normal    ?  Vagina: normal appearing vagina with atrophic changes and no discharge, no lesions ?             Cervix: no lesions ?             Pap taken: Yes.   ?Bimanual Exam:  Uterus:  normal size, contour, position, consistency, mobility, non-tender ?             Adnexa: normal adnexa and no mass, fullness, tenderness ?              Rectovaginal: Confirms ?              Anus:  normal sphincter tone, no lesions ? ?Chaperone, Octaviano Batty, CMA, was present for exam. ? ?Assessment/Plan: ?1. Encntr for gyn exam (general) (routine) w/o abn findings ?- pap obtained today.  Pt and I discussed guidelines.  Desires to continue ?- MMG 2023.  Will ask for copy. ?- BMD is scheduled ?- colonoscopy 2021 ?- vaccines reviewed/updated ? ?2. Cervical cancer screening ?- Cytology - PAP( [place]) ?- PR OBTAINING SCREEN PAP SMEAR ? ?3. Dysplasia of cervix, low grade (CIN 1) ?- h/o ? ?4. Lichen sclerosus ?- rx for mometasone ointment 0.1% ointment topically BID ? ?5. Severe aortic stenosis ?- has appt scheduled for discussion of surgical correction ? ?6. Polymyalgia rheumatica ([facility]) ?- followed by Dr. Amil Amen ? ?7. Skin candid ?- RF for nystatin cream topically BID for up to 7 days ? ?8.  Left breast mass ?- pt has biopsy scheduled for tomorrow ? ? ? ?

## 2021-07-17 NOTE — Patient Instructions (Signed)
Dr. Therisa Doyne, GI at [facility] ?

## 2021-07-18 ENCOUNTER — Other Ambulatory Visit: Payer: Self-pay | Admitting: Radiology

## 2021-07-18 DIAGNOSIS — C50812 Malignant neoplasm of overlapping sites of left female breast: Secondary | ICD-10-CM | POA: Diagnosis not present

## 2021-07-18 DIAGNOSIS — C773 Secondary and unspecified malignant neoplasm of axilla and upper limb lymph nodes: Secondary | ICD-10-CM | POA: Diagnosis not present

## 2021-07-21 ENCOUNTER — Encounter: Payer: Self-pay | Admitting: Interventional Cardiology

## 2021-07-21 LAB — CYTOLOGY - PAP: Diagnosis: NEGATIVE

## 2021-07-22 ENCOUNTER — Telehealth: Payer: Self-pay | Admitting: Hematology and Oncology

## 2021-07-22 ENCOUNTER — Encounter: Payer: Self-pay | Admitting: Obstetrics & Gynecology

## 2021-07-22 ENCOUNTER — Other Ambulatory Visit: Payer: Self-pay | Admitting: Radiology

## 2021-07-22 ENCOUNTER — Encounter (HOSPITAL_BASED_OUTPATIENT_CLINIC_OR_DEPARTMENT_OTHER): Payer: Self-pay | Admitting: *Deleted

## 2021-07-22 DIAGNOSIS — C50919 Malignant neoplasm of unspecified site of unspecified female breast: Secondary | ICD-10-CM

## 2021-07-22 NOTE — Telephone Encounter (Signed)
Spoke to patient to confirm morning clinic appointment for 4/19, [facility] will send paperwork ?

## 2021-07-23 ENCOUNTER — Ambulatory Visit
Admission: RE | Admit: 2021-07-23 | Discharge: 2021-07-23 | Disposition: A | Discharge status: Home / Self Care | Payer: Medicare Other | Source: Ambulatory Visit | Attending: Radiology | Admitting: Radiology

## 2021-07-23 ENCOUNTER — Encounter: Payer: Self-pay | Admitting: *Deleted

## 2021-07-23 DIAGNOSIS — C50919 Malignant neoplasm of unspecified site of unspecified female breast: Secondary | ICD-10-CM

## 2021-07-23 DIAGNOSIS — C50612 Malignant neoplasm of axillary tail of left female breast: Secondary | ICD-10-CM | POA: Diagnosis not present

## 2021-07-23 MED ORDER — GADOBUTROL 1 MMOL/ML IV SOLN
9.0000 mL | Freq: Once | INTRAVENOUS | Status: AC | PRN
  Administered 2021-07-23: 9 mL via INTRAVENOUS

## 2021-07-28 ENCOUNTER — Telehealth: Payer: Self-pay | Admitting: Hematology and Oncology

## 2021-07-28 ENCOUNTER — Other Ambulatory Visit: Payer: Self-pay | Admitting: *Deleted

## 2021-07-28 DIAGNOSIS — Z170 Estrogen receptor positive status [ER+]: Secondary | ICD-10-CM | POA: Insufficient documentation

## 2021-07-28 NOTE — Telephone Encounter (Signed)
Rescheduled appointment per provider. Patient aware.  

## 2021-07-29 ENCOUNTER — Encounter: Payer: Medicare Other | Admitting: Surgery

## 2021-07-29 DIAGNOSIS — Z961 Presence of intraocular lens: Secondary | ICD-10-CM | POA: Diagnosis not present

## 2021-07-29 DIAGNOSIS — H52203 Unspecified astigmatism, bilateral: Secondary | ICD-10-CM | POA: Diagnosis not present

## 2021-07-29 DIAGNOSIS — H524 Presbyopia: Secondary | ICD-10-CM | POA: Diagnosis not present

## 2021-07-30 ENCOUNTER — Encounter: Payer: Self-pay | Admitting: Physical Therapy

## 2021-07-30 ENCOUNTER — Other Ambulatory Visit: Payer: Self-pay

## 2021-07-30 ENCOUNTER — Other Ambulatory Visit: Payer: Self-pay | Admitting: *Deleted

## 2021-07-30 ENCOUNTER — Ambulatory Visit: Payer: Medicare Other | Attending: General Surgery | Admitting: Physical Therapy

## 2021-07-30 ENCOUNTER — Inpatient Hospital Stay: Payer: Medicare Other | Attending: Hematology and Oncology | Admitting: Hematology and Oncology

## 2021-07-30 ENCOUNTER — Inpatient Hospital Stay: Payer: Medicare Other

## 2021-07-30 ENCOUNTER — Ambulatory Visit: Payer: Medicare Other | Admitting: Radiation Oncology

## 2021-07-30 ENCOUNTER — Encounter: Payer: Self-pay | Admitting: *Deleted

## 2021-07-30 ENCOUNTER — Ambulatory Visit: Payer: Medicare Other | Admitting: Hematology and Oncology

## 2021-07-30 DIAGNOSIS — Z79811 Long term (current) use of aromatase inhibitors: Secondary | ICD-10-CM | POA: Diagnosis not present

## 2021-07-30 DIAGNOSIS — Z803 Family history of malignant neoplasm of breast: Secondary | ICD-10-CM | POA: Diagnosis not present

## 2021-07-30 DIAGNOSIS — C50512 Malignant neoplasm of lower-outer quadrant of left female breast: Secondary | ICD-10-CM | POA: Insufficient documentation

## 2021-07-30 DIAGNOSIS — R293 Abnormal posture: Secondary | ICD-10-CM | POA: Insufficient documentation

## 2021-07-30 DIAGNOSIS — Z170 Estrogen receptor positive status [ER+]: Secondary | ICD-10-CM | POA: Insufficient documentation

## 2021-07-30 DIAGNOSIS — C773 Secondary and unspecified malignant neoplasm of axilla and upper limb lymph nodes: Secondary | ICD-10-CM | POA: Diagnosis not present

## 2021-07-30 MED ORDER — LETROZOLE 2.5 MG PO TABS: 2.5000 mg | ORAL_TABLET | Freq: Every day | ORAL | 3 refills | Status: DC

## 2021-07-30 NOTE — Progress Notes (Signed)
[facility] ?CONSULT NOTE ? ?Patient Care Team: ?Crist Infante, MD as PCP - General (Internal Medicine) ?Thompson Grayer, MD as PCP - Electrophysiology (Cardiology) ?Belva Crome, MD as PCP - Cardiology (Cardiology) ?Rockwell Germany, RN as Oncology Nurse Navigator ?Mauro Kaufmann, RN as Oncology Nurse Navigator ?Stark Klein, MD as Consulting Physician (General Surgery) ?Nicholas Lose, MD as Consulting Physician (Hematology and Oncology) ?Gery Pray, MD as Consulting Physician (Radiation Oncology) ? ?CHIEF COMPLAINTS/PURPOSE OF CONSULTATION:  ?Newly diagnosed breast cancer ? ?HISTORY OF PRESENTING ILLNESS:  ?Marissa Powers 81 y.o. female is here because of recent diagnosis of left breast cancer.  She had a routine screening mammogram that showed global asymmetry/density.  This led to ultrasound which revealed multiple nodules in the left breast.  This further led to a breast MRI that showed 8 cm of multifocal nodular disease in the breast.  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  She also had 1 enlarged lymph node which on biopsy was positive for breast cancer. ? ?I reviewed her records extensively and collaborated the history with the patient. ? ?SUMMARY OF ONCOLOGIC HISTORY: ?Oncology History  ?Malignant neoplasm of lower-outer quadrant of left breast of female, estrogen receptor positive ([place])  ?07/18/2021 Initial Diagnosis  ? Left breast global asymmetry/density measuring 8 x 4.4 x 4.7 cm.  Ultrasound showed 2 focal masses.  3.1 cm: Biopsy grade 2 ILC ER 90%, PR 10%, HER2 equivocal, FISH negative, Ki-67 10%; lymph node biopsy: Positive ?Breast MRI abnormality left breast measured 8 x 4.2 x 4.1 cm ?  ? ? ? ?MEDICAL HISTORY:  ?Past Medical History:  ?Diagnosis Date  ? Arthritis   ? osteoarthritis  ? Atrial fibrillation ([place])   ? CAD (coronary artery disease)   ? Dysplasia of cervix, low  grade (CIN 1) 1990  ? HPV, Cryo, Laser  ? Fibroadenoma   ? Left, at 5 o'clock, not excised   ? Fibroid 2002  ? 1 cm, 2 cm  ? GERD (gastroesophageal reflux disease)   ? Hypercholesteremia   ? Hypertension   ? Hyperthyroidism   ? Severe aortic stenosis   ? ? ?SURGICAL HISTORY: ?Past Surgical History:  ?Procedure Laterality Date  ? BREAST BIOPSY Bilateral 1970's  ? x2 one a side  ? CATARACT EXTRACTION    ? bilateral  ? implantable loop recorder removal  05/30/2021  ? MDT reveal LINQ removed by Dr Rayann Heman  ? LOOP RECORDER INSERTION N/A 08/11/2017  ? Procedure: LOOP RECORDER INSERTION;  Surgeon: Thompson Grayer, MD;  Location: [facility];  Service: Cardiovascular;  Laterality: N/A;  ? RETINAL DETACHMENT SURGERY  2000  ? RIGHT/LEFT HEART CATH AND CORONARY ANGIOGRAPHY N/A 06/19/2021  ? Procedure: RIGHT/LEFT HEART CATH AND CORONARY ANGIOGRAPHY;  Surgeon: Belva Crome, MD;  Location: [facility];  Service: Cardiovascular;  Laterality: N/A;  ? THYROIDECTOMY    ? TONSILLECTOMY    ? TOTAL KNEE ARTHROPLASTY  04/25/2012  ? Procedure: TOTAL KNEE BILATERAL;  Surgeon: Gearlean Alf, MD;  Location: [facility];  Service: Orthopedics;  Laterality: Bilateral;  ? ? ?SOCIAL HISTORY: ?Social History  ? ?Socioeconomic History  ? Marital status: Widowed  ?  Spouse name: Not on file  ? Number of children: 2  ? Years of education: Not on file  ? Highest education level: Not on file  ?Occupational History  ? Not on file  ?  Tobacco Use  ? Smoking status: Never  ?  Passive exposure: Never  ? Smokeless tobacco: Never  ?Substance and Sexual Activity  ? Alcohol use: Yes  ?  Alcohol/week: 4.0 - 6.0 standard drinks  ?  Types: 4 - 6 Standard drinks or equivalent per week  ? Drug use: No  ? Sexual activity: Not Currently  ?  Partners: Male  ?  Birth control/protection: Post-menopausal  ?Other Topics Concern  ? Not on file  ?Social History Narrative  ? Not on file  ? ?Social Determinants of Health  ? ?Financial Resource Strain: Not on file   ?Food Insecurity: Not on file  ?Transportation Needs: Not on file  ?Physical Activity: Not on file  ?Stress: Not on file  ?Social Connections: Not on file  ?Intimate Partner Violence: Not on file  ? ? ?FAMILY HISTORY: ?Family History  ?Problem Relation Age of Onset  ? Breast cancer Maternal Grandmother   ? Heart attack Maternal Grandmother   ? Aortic aneurysm Mother   ? Clotting disorder Mother   ? Breast cancer Maternal Aunt 62  ? Heart disease Brother   ?     stints  ? Healthy Father   ? ? ?ALLERGIES:  is allergic to cashew nut oil and other. ? ?MEDICATIONS:  ?Current Outpatient Medications  ?Medication Sig Dispense Refill  ? letrozole (FEMARA) 2.5 MG tablet Take 1 tablet (2.5 mg total) by mouth daily. 90 tablet 3  ? acetaminophen (TYLENOL) 500 MG tablet Take 500 mg by mouth daily.    ? amiodarone (PACERONE) 200 MG tablet Take 1 tablet (200 mg total) by mouth daily. 90 tablet 3  ? amoxicillin (AMOXIL) 500 MG capsule Take 2,000 mg by mouth See admin instructions. Take 4 capsules (2000 mg) by mouth 1 hour prior to dental appointments    ? apixaban (ELIQUIS) 5 MG TABS tablet Take 1 tablet (5 mg total) by mouth 2 (two) times daily. Resume Apixaban at 8 PM 06/19/2921 60 tablet 11  ? B-D TB SYRINGE 1CC/27GX1/2" 27G X 1/2" 1 ML MISC Inject into the skin once a week.    ? Calcium Carb-Cholecalciferol (CALCIUM + D3 PO) Take 1 tablet by mouth in the morning.    ? cholecalciferol (VITAMIN D3) 25 MCG (1000 UNIT) tablet Take 1,000 Units by mouth in the morning.    ? Cyanocobalamin (B-12 PO) Take 1,000 mcg by mouth daily.    ? denosumab (PROLIA) 60 MG/ML SOSY injection Inject 60 mg into the skin every 6 (six) months.    ? folic acid (FOLVITE) 1 MG tablet Take 2 mg by mouth in the morning.    ? levothyroxine (SYNTHROID, LEVOTHROID) 88 MCG tablet Take 88 mcg by mouth daily before breakfast.    ? Loperamide HCl (IMODIUM A-D PO) Take 4 mg by mouth daily.    ? Methotrexate 25 MG/ML SOSY Inject 1 mL into the skin every Wednesday.    ?  metoprolol succinate (TOPROL XL) 25 MG 24 hr tablet Take 1 tablet (25 mg total) by mouth daily. 90 tablet 3  ? mometasone (ELOCON) 0.1 % ointment Apply topically to vulvar skin no more than twice weekly 45 g 1  ? nitroGLYCERIN (NITROSTAT) 0.4 MG SL tablet Place 1 tablet (0.4 mg total) under the tongue every 5 (five) minutes as needed for chest pain. 25 tablet 3  ? nystatin cream (MYCOSTATIN) Apply 1 application. topically 2 (two) times daily. Apply to affected area BID for up to 7 days to skin under breasts  30 g 1  ? pantoprazole (PROTONIX) 40 MG tablet Take 40 mg by mouth 2 (two) times daily as needed. (Patient not taking: Reported on 07/17/2021)    ? predniSONE (DELTASONE) 1 MG tablet Take 3 mg by mouth daily with breakfast. continuous    ? telmisartan (MICARDIS) 20 MG tablet Take 20 mg by mouth every evening.    ? zolpidem (AMBIEN) 5 MG tablet Take 2.5 mg by mouth at bedtime as needed for sleep.     ? ?No current facility-administered medications for this visit.  ? ? ?REVIEW OF SYSTEMS:   ?Constitutional: Denies fevers, chills or abnormal night sweats ?  ?All other systems were reviewed with the patient and are negative. ? ?PHYSICAL EXAMINATION: ?ECOG PERFORMANCE STATUS: 1 - Symptomatic but completely ambulatory ? ?Vitals:  ? 07/30/21 1209  ?BP: (!) 152/67  ?Pulse: 68  ?Resp: 18  ?Temp: 97.8 ?F (36.6 ?C)  ?SpO2: 98%  ? ?Filed Weights  ? 07/30/21 1209  ?Weight: 180 lb 8 oz (81.9 kg)  ? ?  ? ?LABORATORY DATA:  ?I have reviewed the data as listed ?Lab Results  ?Component Value Date  ? WBC 5.7 07/13/2021  ? HGB 10.4 (L) 07/13/2021  ? HCT 31.7 (L) 07/13/2021  ? MCV 109.3 (H) 07/13/2021  ? PLT 203 07/13/2021  ? ?Lab Results  ?Component Value Date  ? NA 140 07/13/2021  ? K 4.2 07/13/2021  ? CL 110 07/13/2021  ? CO2 24 07/13/2021  ? ? ?RADIOGRAPHIC STUDIES: ?I have personally reviewed the radiological reports and agreed with the findings in the report. ? ?ASSESSMENT AND PLAN:  ?Malignant neoplasm of lower-outer quadrant of  left breast of female, estrogen receptor positive ([place]) ?07/18/2021:Left breast global asymmetry/density measuring 8 x 4.4 x 4.7 cm.  Ultrasound showed 2 focal masses.  3.1 cm: Biopsy grade 2 ILC ER 90%, PR

## 2021-07-30 NOTE — Assessment & Plan Note (Signed)
07/18/2021:Left breast global asymmetry/density measuring 8 x 4.4 x 4.7 cm.  Ultrasound showed 2 focal masses.  3.1 cm: Biopsy grade 2 ILC ER 90%, PR 10%, HER2 equivocal, FISH negative, Ki-67 10%; lymph node biopsy: Positive ?Breast MRI abnormality left breast measured 8 x 4.2 x 4.1 cm ? ?Pathology and radiology counseling: Discussed with the patient, the details of pathology including the type of breast cancer,the clinical staging, the significance of ER, PR and HER-2/neu receptors and the implications for treatment. After reviewing the pathology in detail, we proceeded to discuss the different treatment options between surgery, radiation, chemotherapy, antiestrogen therapies. ? ?Treatment plan: ?1.  Neoadjuvant antiestrogen therapy with letrozole ?2. await TAVR surgery ?3.  Mastectomy with targeted lymph node surgery ?4.  Adjuvant radiation ?Genetics consultation ? ?Letrozole counseling: We discussed the risks and benefits of anti-estrogen therapy with aromatase inhibitors. These include but not limited to insomnia, hot flashes, mood changes, vaginal dryness, bone density loss, and weight gain. We strongly believe that the benefits far outweigh the risks. Patient understands these risks and consented to starting treatment. Planned treatment duration is 5 years. ? ?Return to clinic with a telephone visit in 1 month to assess tolerance to antiestrogen therapy ? ?

## 2021-07-30 NOTE — Therapy (Signed)
?OUTPATIENT PHYSICAL THERAPY BREAST CANCER BASELINE EVALUATION ? ? ?Patient Name: Marissa Powers ?MRN: [id number] ?DOB:[birth year], 81 y.o., female ?Today's Date: 07/30/2021 ? ? PT End of Session - 07/30/21 1217   ? ? Visit Number 1   ? Number of Visits 2   ? Date for PT Re-Evaluation 01/29/22   ? PT Start Time 1342   ? PT Stop Time 0350   ? PT Time Calculation (min) 37 min   ? Activity Tolerance Patient tolerated treatment well   ? Behavior During Therapy [facility] for tasks assessed/performed   ? ?  ?  ? ?  ? ? ?Past Medical History:  ?Diagnosis Date  ? Arthritis   ? osteoarthritis  ? Atrial fibrillation ([place])   ? CAD (coronary artery disease)   ? Dysplasia of cervix, low grade (CIN 1) 1990  ? HPV, Cryo, Laser  ? Fibroadenoma   ? Left, at 5 o'clock, not excised   ? Fibroid 2002  ? 1 cm, 2 cm  ? GERD (gastroesophageal reflux disease)   ? Hypercholesteremia   ? Hypertension   ? Hyperthyroidism   ? Severe aortic stenosis   ? ?Past Surgical History:  ?Procedure Laterality Date  ? BREAST BIOPSY Bilateral 1970's  ? x2 one a side  ? CATARACT EXTRACTION    ? bilateral  ? implantable loop recorder removal  05/30/2021  ? MDT reveal LINQ removed by Dr Rayann Heman  ? LOOP RECORDER INSERTION N/A 08/11/2017  ? Procedure: LOOP RECORDER INSERTION;  Surgeon: Thompson Grayer, MD;  Location: [facility];  Service: Cardiovascular;  Laterality: N/A;  ? RETINAL DETACHMENT SURGERY  2000  ? RIGHT/LEFT HEART CATH AND CORONARY ANGIOGRAPHY N/A 06/19/2021  ? Procedure: RIGHT/LEFT HEART CATH AND CORONARY ANGIOGRAPHY;  Surgeon: Belva Crome, MD;  Location: [facility];  Service: Cardiovascular;  Laterality: N/A;  ? THYROIDECTOMY    ? TONSILLECTOMY    ? TOTAL KNEE ARTHROPLASTY  04/25/2012  ? Procedure: TOTAL KNEE BILATERAL;  Surgeon: Gearlean Alf, MD;  Location: [facility];  Service: Orthopedics;  Laterality: Bilateral;  ? ?Patient Active Problem List  ? Diagnosis Date Noted  ? Malignant neoplasm of lower-outer quadrant of left breast of female,  estrogen receptor positive ([place]) 07/28/2021  ? COVID-19 virus infection 07/11/2021  ? Severe aortic stenosis 06/19/2021  ? Secondary hypercoagulable state ([place]) 08/28/2019  ? Pain in joint of left shoulder 09/20/2018  ? Coronary artery disease of native artery of native heart with stable angina pectoris ([place]) 09/15/2018  ? Paroxysmal atrial fibrillation ([place]) 10/04/2017  ? Lichen sclerosus 09/38/1829  ?  Class: History of  ? Hypertension   ? GERD (gastroesophageal reflux disease)   ? Arthritis   ? Hypercholesteremia   ? OA (osteoarthritis) of knee 04/25/2012  ? Fibroid 2002  ? Dysplasia of cervix, low grade (CIN 1) 1990  ? ? ?PCP: Crist Infante, MD ? ?REFERRING PROVIDER: Rolm Bookbinder, MD ? ?REFERRING DIAG: Left breast cancer ? ?THERAPY DIAG:  ?Malignant neoplasm of upper-outer quadrant of left breast in female, estrogen receptor positive ([place]) ? ?Abnormal posture ? ?ONSET DATE: 07/18/2021 ? ?SUBJECTIVE                                                                                                                                                                                          ? ?  SUBJECTIVE STATEMENT: ?Patient reports she is here today to be seen by her medical team for her newly diagnosed left breast cancer.  ? ?PERTINENT HISTORY:  ?Patient was diagnosed on 07/18/2021 with left grade 2 invasive lobular carcinoma breast cancer. It measures 7.8 cm and is located in the upper outer quadrant. It is ER/PR positive and HER2 negative with a Ki67 of 10%. She has severe symptomatic aortic stenosis and is in the process of being considered for a TAVR procedure. ? ?PATIENT GOALS   reduce lymphedema risk and learn post op HEP.  ? ?PAIN:  ?Are you having pain? No but sometimes she has a generalized ache "all over" because she has polymyalgia rheumatica. ? ? ?PRECAUTIONS: Active CA Other: Cardiac condition ? ?HAND DOMINANCE: right ? ?WEIGHT BEARING RESTRICTIONS No ? ?FALLS:  ?Has patient fallen in last 6 months?  No ? ?LIVING ENVIRONMENT: ?Patient lives with: alone  ?Lives in: House/apartment ?Has following equipment at home: None ? ?OCCUPATION: Retired ? ?LEISURE: She walks her dog 30-40 min/day ? ?PRIOR LEVEL OF FUNCTION: Independent ? ? ?OBJECTIVE ? ?COGNITION: ? Overall cognitive status: Within functional limits for tasks assessed   ? ?POSTURE:  ?Forward head and rounded shoulders posture ? ?UPPER EXTREMITY AROM/PROM: ? ?A/PROM RIGHT  07/30/2021 ?  ?Shoulder extension 46  ?Shoulder flexion 146  ?Shoulder abduction 162  ?Shoulder internal rotation 65  ?Shoulder external rotation 87  ?  (Blank rows = not tested) ? ?A/PROM LEFT  07/30/2021  ?Shoulder extension 54  ?Shoulder flexion 137  ?Shoulder abduction 161  ?Shoulder internal rotation 76  ?Shoulder external rotation 90  ?  (Blank rows = not tested) ? ? ?CERVICAL AROM: ?All within normal limits:  ? ? Percent limited  ?Flexion WNL  ?Extension 25% limited  ?Right lateral flexion 50% limited  ?Left lateral flexion 50% limited  ?Right rotation 25% limited  ?Left rotation 50% limited  ? ? ? ?UPPER EXTREMITY STRENGTH: WFL ? ? ?LYMPHEDEMA ASSESSMENTS:  ? ?[place] RIGHT  07/30/2021  ?10 cm proximal to olecranon process 30.3  ?Olecranon process 25.4  ?10 cm proximal to ulnar styloid process 24  ?Just proximal to ulnar styloid process 16.4  ?Across hand at thumb web space 19  ?At base of 2nd digit 7.1  ?(Blank rows = not tested) ? ?[place] LEFT  07/30/2021  ?10 cm proximal to olecranon process 29.7  ?Olecranon process 25.7  ?10 cm proximal to ulnar styloid process 22.7  ?Just proximal to ulnar styloid process 15.8  ?Across hand at thumb web space 19  ?At base of 2nd digit 7  ?(Blank rows = not tested) ? ? ?L-DEX LYMPHEDEMA SCREENING: ? ?The patient was assessed using the L-Dex machine today to produce a lymphedema index baseline score. The patient will be reassessed on a regular basis (typically every 3 months) to obtain new L-Dex scores. If the score is > 6.5 points away from  his/her baseline score indicating onset of subclinical lymphedema, it will be recommended to wear a compression garment for 4 weeks, 12 hours per day and then be reassessed. If the score continues to be > 6.5 points from baseline at reassessment, we will initiate lymphedema treatment. Assessing in this manner has a 95% rate of preventing clinically significant lymphedema. ? ? L-DEX FLOWSHEETS - 07/30/21 1600   ? ?  ? L-DEX LYMPHEDEMA SCREENING  ? Measurement Type Unilateral   ? L-DEX MEASUREMENT EXTREMITY Upper Extremity   ? POSITION  Standing   ? DOMINANT SIDE Right   ?  At Risk Side Left   ? BASELINE SCORE (UNILATERAL) -0.3   ? ?  ?  ? ?  ? ? ? ?QUICK DASH SURVEY: ? ? ?PATIENT EDUCATION:  ?Education details: Lymphedema risk reduction and post op shoulder/posture HEP ?Person educated: Patient ?Education method: Explanation, Demonstration, Handout ?Education comprehension: Patient verbalized understanding and returned demonstration ? ? ?HOME EXERCISE PROGRAM: ?Patient was instructed today in a home exercise program today for post op shoulder range of motion. These included active assist shoulder flexion in sitting, scapular retraction, wall walking with shoulder abduction, and hands behind head external rotation.  She was encouraged to do these twice a day, holding 3 seconds and repeating 5 times when permitted by her physician. ? ? ?ASSESSMENT: ? ?CLINICAL IMPRESSION: ?Patient was diagnosed on 07/18/2021 with left grade 2 invasive lobular carcinoma breast cancer. It measures 7.8 cm and is located in the upper outer quadrant. It is ER/PR positive and HER2 negative with a Ki67 of 10%. She has severe symptomatic aortic stenosis and is in the process of being considered for a TAVR procedure. Her multidisciplinary medical team met prior to her assessments to determine a recommended treatment plan. She is planning to have neoadjuvant anti-estrogen therapy followed by a left mastectomy and targeted node dissection,  radiation, and anti-estrogen therapy. She will benefit from a post op PT reassessment to determine needs and from L-Dex screens every 3 months for 2 years to detect subclinical lymphedema. ? ?Pt will benefit from skilled th

## 2021-07-31 ENCOUNTER — Telehealth: Payer: Self-pay | Admitting: Hematology and Oncology

## 2021-07-31 DIAGNOSIS — C50112 Malignant neoplasm of central portion of left female breast: Secondary | ICD-10-CM | POA: Diagnosis not present

## 2021-07-31 DIAGNOSIS — Z170 Estrogen receptor positive status [ER+]: Secondary | ICD-10-CM | POA: Diagnosis not present

## 2021-07-31 NOTE — Telephone Encounter (Signed)
Per 4/19 in basket called and spoke to pt about Gen appointment.  Pt confirmed appointment  ?

## 2021-08-05 ENCOUNTER — Encounter: Payer: Self-pay | Admitting: Licensed Clinical Social Worker

## 2021-08-05 NOTE — Progress Notes (Signed)
[facility] Clinical Social Work  ?Initial Assessment ? ? ?Marissa Powers is a 81 y.o. year old female contacted by phone. Clinical Social Work was referred by  new patient protocol  for assessment of psychosocial needs.  ? ?SDOH (Social Determinants of Health) assessments performed: Yes ?SDOH Interventions   ? ?Flowsheet Row Most Recent Value  ?SDOH Interventions   ?Food Insecurity Interventions Intervention Not Indicated  ?Financial Strain Interventions Intervention Not Indicated  ?Housing Interventions Intervention Not Indicated  ?Transportation Interventions Intervention Not Indicated  ? ?  ?  ?Distress Screen completed: No ?   ? View : No data to display.  ?  ?  ?  ? ? ? ? ?Family/Social Information:  ?Housing Arrangement: patient lives alone ?Family members/support persons in your life? Family (sons) and Friends ?Transportation concerns: no  ?Employment: Retired. Income source: Retirement income & savings ?Financial concerns: No ?Type of concern: None ?Food access concerns: no ?Services Currently in place:  n/a ? ?Coping/ Adjustment to diagnosis: ?Patient understands treatment plan and what happens next? yes, has to have a heart procedure prior to treatment for her cancer. Understands that she is starting AI and will then have surgery later ?Concerns about diagnosis and/or treatment: I'm not especially worried about anything ?Patient reported stressors:  general health issues but coping well ?Hopes and priorities: be able to enjoy her summer at her beach house ?Current coping skills/ strengths: Ability for insight , Average or above average intelligence , Capable of independent living , Communication skills , and Supportive family/friends  ? ? ? SUMMARY: ?Current SDOH Barriers:  ?None at this time ? ?Clinical Social Work Clinical Goal(s):  ?None at this time ? ?Interventions: ?Discussed the importance of support during treatment ?Informed patient of the support team roles and support services at [facility] ?Provided CSW  contact information and encouraged patient to call with any questions or concerns ? ? ?Follow Up Plan: Patient will contact CSW with any support or resource needs ?Patient verbalizes understanding of plan: Yes ? ? ? ?Reginald Mangels E Lisamarie Coke, LCSW ?

## 2021-08-06 DIAGNOSIS — E039 Hypothyroidism, unspecified: Secondary | ICD-10-CM | POA: Diagnosis not present

## 2021-08-08 ENCOUNTER — Other Ambulatory Visit: Payer: Self-pay

## 2021-08-08 DIAGNOSIS — I350 Nonrheumatic aortic (valve) stenosis: Secondary | ICD-10-CM

## 2021-08-13 ENCOUNTER — Encounter: Payer: Self-pay | Admitting: Surgery

## 2021-08-13 ENCOUNTER — Institutional Professional Consult (permissible substitution) (INDEPENDENT_AMBULATORY_CARE_PROVIDER_SITE_OTHER): Payer: Medicare Other | Admitting: Surgery

## 2021-08-13 VITALS — BP 164/83 | HR 58 | Resp 20 | Ht 65.0 in

## 2021-08-13 DIAGNOSIS — I350 Nonrheumatic aortic (valve) stenosis: Secondary | ICD-10-CM

## 2021-08-13 DIAGNOSIS — I25118 Atherosclerotic heart disease of native coronary artery with other forms of angina pectoris: Secondary | ICD-10-CM

## 2021-08-13 NOTE — Progress Notes (Signed)
Patient ID: Marissa Powers, female   DOB: April 18, 1940, 81 y.o.   MRN: [id number] ? ?[facility]   ?MULTIDISCIPLINARY HEART VALVE CLINIC ?  ? ? ? ?   ?[address] ?      [place] [address] ?            [phone]   ?      ? ?CARDIOTHORACIC SURGERY CONSULTATION REPORT ? ?PCP is Crist Infante, MD ?Referring Provider is Darlina Guys, MD ?Primary Cardiologist is Sinclair Grooms, MD ? ?Reason for consultation: Severe aortic stenosis ? ?HPI: ? 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  She has been followed by Dr. Tamala Julian with moderate aortic stenosis.  She recently has had worsening shortness of breath and fatigue with exertion.  2D echo on 06/09/2021 showed a calcified aortic valve with a mean gradient of 40 mmHg and a peak gradient of 63 mmHg.  Valve area was 0.85 cm? by VTI consistent with severe aortic stenosis.  Left ventricular ejection fraction was 60 to 65%.  There is grade 2 diastolic dysfunction. ? ?She was recently diagnosed with left breast cancer found on routine screening.  She subsequently had an ultrasound showing multiple nodules in the left breast and an MRI showing 8 cm of multifocal nodular disease in the left breast.  She underwent a biopsy that showed invasive lobular cancer and had 1 large lymph node which on biopsy was positive for breast cancer.  She has been seen by Dr. Sonny Dandy and it is felt that she will require mastectomy and radiation therapy.  She was started on neoadjuvant antiestrogen therapy until surgery. ? ?She is here today with her son, Marissa Powers, who is an Doctor, general practice in [place].  She also has a friend with her.  She is widowed and her husband Marissa Powers was a nephrologist in [place] for many years. ? ?Past Medical  History:  ?Diagnosis Date  ? Arthritis   ? osteoarthritis  ? Atrial fibrillation ([place])   ? CAD (coronary artery disease)   ? Dysplasia of cervix, low grade (CIN 1) 1990  ? HPV, Cryo, Laser  ? Fibroadenoma   ? Left, at 5 o'clock, not excised   ? Fibroid 2002  ? 1 cm, 2 cm  ? GERD (gastroesophageal reflux disease)   ? Hypercholesteremia   ? Hypertension   ? Hyperthyroidism   ? Severe aortic stenosis   ? ? ?Past Surgical History:  ?Procedure Laterality Date  ? BREAST BIOPSY Bilateral 1970's  ? x2 one a side  ? CATARACT EXTRACTION    ? bilateral  ? implantable loop recorder removal  05/30/2021  ? MDT reveal LINQ removed by Dr Rayann Heman  ? LOOP RECORDER INSERTION N/A 08/11/2017  ? Procedure: LOOP RECORDER INSERTION;  Surgeon: Thompson Grayer, MD;  Location: [facility];  Service: Cardiovascular;  Laterality: N/A;  ? RETINAL DETACHMENT SURGERY  2000  ? RIGHT/LEFT HEART CATH AND CORONARY ANGIOGRAPHY N/A 06/19/2021  ? Procedure: RIGHT/LEFT HEART CATH AND CORONARY ANGIOGRAPHY;  Surgeon: Belva Crome, MD;  Location: [facility];  Service: Cardiovascular;  Laterality: N/A;  ? THYROIDECTOMY    ? TONSILLECTOMY    ? TOTAL KNEE ARTHROPLASTY  04/25/2012  ? Procedure: TOTAL KNEE BILATERAL;  Surgeon: Gearlean Alf, MD;  Location: [facility];  Service: Orthopedics;  Laterality: Bilateral;  ? ? ?Family History  ?Problem Relation Age of Onset  ? Breast cancer Maternal Grandmother   ? Heart attack Maternal Grandmother   ? Aortic aneurysm Mother   ? Clotting disorder Mother   ? Breast cancer Maternal Aunt 42  ? Heart disease Brother   ?     stints  ? Healthy Father   ? ? ?Social History  ? ?Socioeconomic History  ? Marital status: Widowed  ?  Spouse name: Not on file  ? Number of children: 2  ? Years of education: Not on file  ? Highest education level: Not on file  ?Occupational History  ? Not on file  ?Tobacco Use  ? Smoking status: Never  ?  Passive exposure: Never  ? Smokeless tobacco: Never  ?Substance and Sexual Activity  ?  Alcohol use: Yes  ?  Alcohol/week: 4.0 - 6.0 standard drinks  ?  Types: 4 - 6 Standard drinks or equivalent per week  ? Drug use: No  ? Sexual activity: Not Currently  ?  Partners: Male  ?  Birth control/protection: Post-menopausal  ?Other Topics Concern  ? Not on file  ?Social History Narrative  ? Not on file  ? ?Social Determinants of Health  ? ?Financial Resource Strain: Low Risk   ? Difficulty of Paying Living Expenses: Not hard at all  ?Food Insecurity: No Food Insecurity  ? Worried About Charity fundraiser in the Last Year: Never true  ? Ran Out of Food in the Last Year: Never true  ?Transportation Needs: No Transportation Needs  ? Lack of Transportation (Medical): No  ? Lack of Transportation (Non-Medical): No  ?Physical Activity: Not on file  ?Stress: Not on file  ?Social Connections: Not on file  ?Intimate Partner Violence: Not on file  ? ? ?Prior to Admission medications   ?Medication Sig Start Date End Date Taking? Authorizing Provider  ?acetaminophen (TYLENOL) 500 MG tablet Take 1,000 mg by mouth every 6 (six) hours as needed (pain.).   Yes [provider]  ?amiodarone (PACERONE) 200 MG tablet Take 1 tablet (200 mg total) by mouth daily. ?Patient taking differently: Take 200 mg by mouth every evening. 07/16/21  Yes Belva Crome, MD  ?amoxicillin (AMOXIL) 500 MG capsule Take 2,000 mg by mouth See admin instructions. Take 4 capsules (2000 mg) by mouth 1 hour prior to dental appointments 02/19/19  Yes [provider]  ?apixaban (ELIQUIS) 5 MG TABS tablet Take 1 tablet (5 mg total) by mouth 2 (two) times daily. Resume Apixaban at 8 PM 06/19/2921 ?Patient taking differently: Take 5 mg by mouth 2 (two) times daily. 06/19/21  Yes Belva Crome, MD  ?B-D TB SYRINGE 1CC/27GX1/2" 27G X 1/2" 1 ML MISC Inject into the skin once a week. 08/19/20  Yes [provider]  ?Calcium Carb-Cholecalciferol (CALCIUM + D3 PO) Take 1 tablet by mouth in the morning.   Yes [provider]   ?cholecalciferol (VITAMIN D3) 25 MCG (1000 UNIT) tablet Take 1,000 Units by mouth in the morning.   Yes [provider]  ?Cyanocobalamin (B-12 PO) Take 1,000 mcg by mouth in the morning.   Yes [provider]  ?denosumab (PROLIA) 60 MG/ML SOSY injection Inject 60 mg into the skin every 6 (six) months.   Yes [provider]  ?folic acid (FOLVITE) 1 MG tablet Take 2 mg by mouth in the morning.   Yes [provider]  ?letrozole (FEMARA) 2.5 MG tablet Take 1 tablet (  2.5 mg total) by mouth daily. ?Patient taking differently: Take 2.5 mg by mouth every evening. 07/30/21  Yes Nicholas Lose, MD  ?levothyroxine (SYNTHROID, LEVOTHROID) 88 MCG tablet Take 88 mcg by mouth daily before breakfast.   Yes [provider]  ?loperamide (IMODIUM) 2 MG capsule Take 2-4 mg by mouth 4 (four) times daily as needed (upset stomach/diarrhea/loose stools).   Yes [provider]  ?methotrexate 50 MG/2ML injection Inject 25 mg into the skin every Wednesday. 07/17/21  Yes [provider]  ?metoprolol succinate (TOPROL XL) 25 MG 24 hr tablet Take 1 tablet (25 mg total) by mouth daily. ?Patient taking differently: Take 25 mg by mouth every evening. 07/16/21  Yes Belva Crome, MD  ?mometasone (ELOCON) 0.1 % ointment Apply topically to vulvar skin no more than twice weekly ?Patient taking differently: Apply 1 application. topically daily as needed (irritation.). 07/17/21  Yes Megan Salon, MD  ?nitroGLYCERIN (NITROSTAT) 0.4 MG SL tablet Place 1 tablet (0.4 mg total) under the tongue every 5 (five) minutes as needed for chest pain. 07/05/20  Yes Belva Crome, MD  ?nystatin cream (MYCOSTATIN) Apply 1 application. topically 2 (two) times daily. Apply to affected area BID for up to 7 days to skin under breasts ?Patient taking differently: Apply 1 application. topically 2 (two) times daily as needed (skin irritation.). 07/17/21  Yes Megan Salon, MD  ?pantoprazole (PROTONIX) 40 MG tablet Take  40 mg by mouth 2 (two) times daily as needed.   Yes [provider]  ?predniSONE (DELTASONE) 1 MG tablet Take 1 mg by mouth daily with breakfast. continuous   Yes [provider]  ?REPATHA SURECLICK 14

## 2021-08-14 ENCOUNTER — Encounter: Payer: Self-pay | Admitting: Internal Medicine

## 2021-08-14 NOTE — Pre-Procedure Instructions (Signed)
Surgical Instructions ? ? ? Your procedure is scheduled on Tuesday, Aug 19, 2021 at 7:20 AM. ? Report to [facility] Main Entrance "A" at 5:30 A.M., then check in with the Admitting office. ? Call this number if you have problems the morning of surgery: ? [phone] ? ? If you have any questions prior to your surgery date call [phone]: Open Monday-Friday 8am-4pm ? ? ? Remember: ? Do not eat or drink after midnight the night before your surgery ?  ?Stop taking Eliquis on Thursday, 5/4. You will take your last dose on Wednesday, 5/3.  ? ?Continue taking all other medications without change through the day before surgery. On the morning of surgery do not take any medications. ? ?STOP on 08/12/21, taking any Aspirin (unless otherwise instructed by your surgeon) Aleve, Naproxen, Ibuprofen, Motrin, Advil, Goody's, BC's, all herbal medications, fish oil, and all vitamins. ?         ?           ?Do NOT Smoke (Tobacco/Vaping) for 24 hours prior to your procedure. ? ?If you use a CPAP at night, you may bring your mask/headgear for your overnight stay. ?  ?Contacts, glasses, piercing's, hearing aid's, dentures or partials may not be worn into surgery, please bring cases for these belongings.  ?  ?For patients admitted to the hospital, discharge time will be determined by your treatment team. ?  ?Patients discharged the day of surgery will not be allowed to drive home, and someone needs to stay with them for 24 hours. ? ?SURGICAL WAITING ROOM VISITATION ?Patients having surgery or a procedure may have two support people in the waiting area. ?Visitors may stay in the waiting area during the procedure and switch out with other visitors if needed. ?Children under the age of 36 must have an adult accompany them who is not the patient. ?If the patient needs to stay at the hospital during part of their recovery, the visitor guidelines for inpatient rooms apply. ? ?Please refer to the [facility] website for the visitor guidelines  for Inpatients (after your surgery is over and you are in a regular room).  ? ? ?Special instructions:   ?[facility]- Preparing For Surgery ? ?Before surgery, you can play an important role. Because skin is not sterile, your skin needs to be as free of germs as possible. You can reduce the number of germs on your skin by washing with CHG (chlorahexidine gluconate) Soap before surgery.  CHG is an antiseptic cleaner which kills germs and bonds with the skin to continue killing germs even after washing.   ? ?Oral Hygiene is also important to reduce your risk of infection.  Remember - BRUSH YOUR TEETH THE MORNING OF SURGERY WITH YOUR REGULAR TOOTHPASTE ? ?Please do not use if you have an allergy to CHG or antibacterial soaps. If your skin becomes reddened/irritated stop using the CHG.  ?Do not shave (including legs and underarms) for at least 48 hours prior to first CHG shower. It is OK to shave your face. ? ?Please follow these instructions carefully. ?  ?Shower the NIGHT BEFORE SURGERY and the MORNING OF SURGERY ? ?If you chose to wash your hair, wash your hair first as usual with your normal shampoo. ? ?After you shampoo, rinse your hair and body thoroughly to remove the shampoo. ? ?Use CHG Soap as you would any other liquid soap. You can apply CHG directly to the skin and wash gently with a scrungie or a clean washcloth.  ? ?  Apply the CHG Soap to your body ONLY FROM THE NECK DOWN.  Do not use on open wounds or open sores. Avoid contact with your eyes, ears, mouth and genitals (private parts). Wash Face and genitals (private parts)  with your normal soap.  ? ?Wash thoroughly, paying special attention to the area where your surgery will be performed. ? ?Thoroughly rinse your body with warm water from the neck down. ? ?DO NOT shower/wash with your normal soap after using and rinsing off the CHG Soap. ? ?Pat yourself dry with a CLEAN TOWEL. ? ?Wear CLEAN PAJAMAS to bed the night before surgery ? ?Place CLEAN SHEETS on  your bed the night before your surgery ? ?DO NOT SLEEP WITH PETS. ? ? ?Day of Surgery: ?Take a shower with CHG soap. ?Do not wear jewelry or makeup ?Do not wear lotions, powders, perfumes, or deodorant. ?Do not shave 48 hours prior to surgery. ?Do not bring valuables to the hospital.  ?[facility] is not responsible for any belongings or valuables. ?Do not wear nail polish, gel polish, artificial nails, or any other type of covering on natural nails (fingers and toes) ?If you have artificial nails or gel coating that need to be removed by a nail salon, please have this removed prior to surgery. Artificial nails or gel coating may interfere with anesthesia's ability to adequately monitor your vital signs. ?Wear Clean/Comfortable clothing the morning of surgery ?Do not apply any deodorants/lotions.   ?Remember to brush your teeth WITH YOUR REGULAR TOOTHPASTE. ?  ?Please read over the following fact sheets that you were given. ? ?If you received a COVID test during your pre-op visit  it is requested that you wear a mask when out in public, stay away from anyone that may not be feeling well and notify your surgeon if you develop symptoms. If you have been in contact with anyone that has tested positive in the last 10 days please notify you surgeon. ?  ? ?

## 2021-08-14 NOTE — Progress Notes (Signed)
error 

## 2021-08-15 ENCOUNTER — Encounter (HOSPITAL_COMMUNITY)
Admission: RE | Admit: 2021-08-15 | Discharge: 2021-08-15 | Disposition: A | Discharge status: Home / Self Care | Payer: Medicare Other | Source: Ambulatory Visit | Attending: Cardiovascular Disease | Admitting: Cardiovascular Disease

## 2021-08-15 ENCOUNTER — Encounter (HOSPITAL_COMMUNITY): Payer: Self-pay

## 2021-08-15 ENCOUNTER — Other Ambulatory Visit: Payer: Self-pay

## 2021-08-15 VITALS — BP 144/79 | HR 56 | Temp 98.1°F | Resp 18 | Ht 65.0 in | Wt 180.3 lb

## 2021-08-15 DIAGNOSIS — I10 Essential (primary) hypertension: Secondary | ICD-10-CM | POA: Diagnosis not present

## 2021-08-15 DIAGNOSIS — Z01818 Encounter for other preprocedural examination: Secondary | ICD-10-CM | POA: Diagnosis not present

## 2021-08-15 DIAGNOSIS — I350 Nonrheumatic aortic (valve) stenosis: Secondary | ICD-10-CM

## 2021-08-15 DIAGNOSIS — I2510 Atherosclerotic heart disease of native coronary artery without angina pectoris: Secondary | ICD-10-CM | POA: Diagnosis not present

## 2021-08-15 DIAGNOSIS — I4891 Unspecified atrial fibrillation: Secondary | ICD-10-CM | POA: Diagnosis not present

## 2021-08-15 DIAGNOSIS — M810 Age-related osteoporosis without current pathological fracture: Secondary | ICD-10-CM

## 2021-08-15 DIAGNOSIS — Z20822 Contact with and (suspected) exposure to covid-19: Secondary | ICD-10-CM | POA: Diagnosis not present

## 2021-08-15 HISTORY — DX: Polymyalgia rheumatica: M35.3

## 2021-08-15 HISTORY — DX: Cardiac arrhythmia, unspecified: I49.9

## 2021-08-15 LAB — URINALYSIS, ROUTINE W REFLEX MICROSCOPIC
Bacteria, UA: NONE SEEN
Bilirubin Urine: NEGATIVE
Glucose, UA: NEGATIVE mg/dL
Hgb urine dipstick: NEGATIVE
Ketones, ur: NEGATIVE mg/dL
Nitrite: NEGATIVE
Protein, ur: NEGATIVE mg/dL
Specific Gravity, Urine: 1.008 (ref 1.005–1.030)
pH: 5 (ref 5.0–8.0)

## 2021-08-15 LAB — PROTIME-INR
INR: 1.1 (ref 0.8–1.2)
Prothrombin Time: 13.9 seconds (ref 11.4–15.2)

## 2021-08-15 LAB — COMPREHENSIVE METABOLIC PANEL
ALT: 14 U/L (ref 0–44)
AST: 21 U/L (ref 15–41)
Albumin: 4 g/dL (ref 3.5–5.0)
Alkaline Phosphatase: 48 U/L (ref 38–126)
Anion gap: 7 (ref 5–15)
BUN: 11 mg/dL (ref 8–23)
CO2: 26 mmol/L (ref 22–32)
Calcium: 9.5 mg/dL (ref 8.9–10.3)
Chloride: 104 mmol/L (ref 98–111)
Creatinine, Ser: 0.91 mg/dL (ref 0.44–1.00)
GFR, Estimated: >60 mL/min (ref >60)
Glucose, Bld: 111 mg/dL — ABNORMAL HIGH (ref 70–99)
Potassium: 4.5 mmol/L (ref 3.5–5.1)
Sodium: 137 mmol/L (ref 135–145)
Total Bilirubin: 1 mg/dL (ref 0.3–1.2)
Total Protein: 7.2 g/dL (ref 6.5–8.1)

## 2021-08-15 LAB — CBC
HCT: 36.3 % (ref 36.0–46.0)
Hemoglobin: 12.1 g/dL (ref 12.0–15.0)
MCH: 36.3 pg — ABNORMAL HIGH (ref 26.0–34.0)
MCHC: 33.3 g/dL (ref 30.0–36.0)
MCV: 109 fL — ABNORMAL HIGH (ref 80.0–100.0)
Platelets: 228 10*3/uL (ref 150–400)
RBC: 3.33 MIL/uL — ABNORMAL LOW (ref 3.87–5.11)
RDW: 13.8 % (ref 11.5–15.5)
WBC: 8.1 10*3/uL (ref 4.0–10.5)
nRBC: 0 % (ref 0.0–0.2)

## 2021-08-15 LAB — TYPE AND SCREEN
ABO/RH(D): O POS
Antibody Screen: NEGATIVE

## 2021-08-15 LAB — SURGICAL PCR SCREEN
MRSA, PCR: NEGATIVE
Staphylococcus aureus: NEGATIVE

## 2021-08-15 NOTE — Progress Notes (Signed)
PCP - Elta Guadeloupe Perini ?Cardiologist - Daneen Schick ?Rheumatologist: Leigh Aurora ? ?PPM/ICD - denies ? ? ?Chest x-ray - 08/15/21 ?EKG - 08/15/21 ?Stress Test - denies ?ECHO - 06/09/21 ?Cardiac Cath - 06/19/21 ? ?Sleep Study - denies ? ? ?Follow your surgeon's instructions on when to stop Aspirin.  If no instructions were given by your surgeon then you will need to call the office to get those instructions.   ? ? ?ERAS Protcol -no ? ? ?COVID TEST- positive for COVID 07/11/21 ? ? ?Anesthesia review: no ? ?Patient denies shortness of breath, fever, cough and chest pain at PAT appointment ? ? ?All instructions explained to the patient, with a verbal understanding of the material. Patient agrees to go over the instructions while at home for a better understanding. Patient also instructed to self quarantine after being tested for COVID-19. The opportunity to ask questions was provided. ? ? ?

## 2021-08-18 ENCOUNTER — Telehealth: Payer: Self-pay | Admitting: Pharmacy Technician

## 2021-08-18 MED ORDER — CEFAZOLIN SODIUM-DEXTROSE 2-4 GM/100ML-% IV SOLN
2.0000 g | INTRAVENOUS | Status: AC
  Administered 2021-08-19: 2 g via INTRAVENOUS
  Filled 2021-08-18 (×2): qty 100

## 2021-08-18 MED ORDER — NOREPINEPHRINE 4 MG/250ML-% IV SOLN
0.0000 ug/min | INTRAVENOUS | Status: AC
  Administered 2021-08-19: 2 ug/min via INTRAVENOUS
  Filled 2021-08-18: qty 250

## 2021-08-18 MED ORDER — POTASSIUM CHLORIDE 2 MEQ/ML IV SOLN
80.0000 meq | INTRAVENOUS | Status: DC
  Filled 2021-08-18: qty 40

## 2021-08-18 MED ORDER — MAGNESIUM SULFATE 50 % IJ SOLN
40.0000 meq | INTRAMUSCULAR | Status: DC
  Filled 2021-08-18: qty 9.85

## 2021-08-18 MED ORDER — DEXMEDETOMIDINE HCL IN NACL 400 MCG/100ML IV SOLN
0.1000 ug/kg/h | INTRAVENOUS | Status: AC
  Administered 2021-08-19: .7 ug/kg/h via INTRAVENOUS
  Administered 2021-08-19: 81.8 ug via INTRAVENOUS
  Filled 2021-08-18: qty 100

## 2021-08-18 MED ORDER — HEPARIN 30,000 UNITS/1000 ML (OHS) CELLSAVER SOLUTION
Status: DC
  Filled 2021-08-18: qty 1000

## 2021-08-18 NOTE — H&P (Signed)
? ?   ?[address] ?      [place] [address] ?            [phone]   ? ?  ?Cardiothoracic Surgery Admission History and Physical ? ? ?PCP is Crist Infante, MD ?Referring Provider is Darlina Guys, MD ?Primary Cardiologist is Sinclair Grooms, MD ?  ?Reason for admission: Severe aortic stenosis ?  ?HPI: ?  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  She has been followed by Dr. Tamala Julian with moderate aortic stenosis.  She recently has had worsening shortness of breath and fatigue with exertion.  2D echo on 06/09/2021 showed a calcified aortic valve with a mean gradient of 40 mmHg and a peak gradient of 63 mmHg.  Valve area was 0.85 cm? by VTI consistent with severe aortic stenosis.  Left ventricular ejection fraction was 60 to 65%.  There is grade 2 diastolic dysfunction. ?  ?She was recently diagnosed with left breast cancer found on routine screening.  She subsequently had an ultrasound showing multiple nodules in the left breast and an MRI showing 8 cm of multifocal nodular disease in the left breast.  She underwent a biopsy that showed invasive lobular cancer and had 1 large lymph node which on biopsy was positive for breast cancer.  She has been seen by Dr. Sonny Dandy and it is felt that she will require mastectomy and radiation therapy.  She was started on neoadjuvant antiestrogen therapy until surgery. ?  ?She has a son, Danese Dorsainvil, who is an Doctor, general practice in [place]. She is widowed and her husband Dannetta Lekas was a nephrologist in [place] for many years. ?  ?    ?Past Medical History:  ?Diagnosis Date  ? Arthritis    ?  osteoarthritis  ? Atrial fibrillation ([place])    ? CAD (coronary artery disease)    ? Dysplasia of cervix, low grade (CIN 1) 1990  ?  HPV, Cryo, Laser   ? Fibroadenoma    ?  Left, at 5 o'clock, not excised   ? Fibroid 2002  ?  1 cm, 2 cm  ? GERD (gastroesophageal reflux disease)    ? Hypercholesteremia    ? Hypertension    ? Hyperthyroidism    ? Severe aortic stenosis    ?  ?  ?     ?Past Surgical History:  ?Procedure Laterality Date  ? BREAST BIOPSY Bilateral 1970's  ?  x2 one a side  ? CATARACT EXTRACTION      ?  bilateral  ? implantable loop recorder removal   05/30/2021  ?  MDT reveal LINQ removed by Dr Rayann Heman  ? LOOP RECORDER INSERTION N/A 08/11/2017  ?  Procedure: LOOP RECORDER INSERTION;  Surgeon: Thompson Grayer, MD;  Location: [facility];  Service: Cardiovascular;  Laterality: N/A;  ? RETINAL DETACHMENT SURGERY   2000  ? RIGHT/LEFT HEART CATH AND CORONARY ANGIOGRAPHY N/A 06/19/2021  ?  Procedure: RIGHT/LEFT HEART CATH AND CORONARY ANGIOGRAPHY;  Surgeon: Belva Crome, MD;  Location: [facility];  Service: Cardiovascular;  Laterality: N/A;  ? THYROIDECTOMY      ? TONSILLECTOMY      ? TOTAL KNEE ARTHROPLASTY   04/25/2012  ?  Procedure: TOTAL KNEE BILATERAL;  Surgeon: Gearlean Alf, MD;  Location: [facility];  Service:  Orthopedics;  Laterality: Bilateral;  ?  ?  ?     ?Family History  ?Problem Relation Age of Onset  ? Breast cancer Maternal Grandmother    ? Heart attack Maternal Grandmother    ? Aortic aneurysm Mother    ? Clotting disorder Mother    ? Breast cancer Maternal Aunt 52  ? Heart disease Brother    ?      stints  ? Healthy Father    ?  ?  ?Social History  ?  ?     ?Socioeconomic History  ? Marital status: Widowed  ?    Spouse name: Not on file  ? Number of children: 2  ? Years of education: Not on file  ? Highest education level: Not on file  ?Occupational History  ? Not on file  ?Tobacco Use  ? Smoking status: Never  ?    Passive exposure: Never  ? Smokeless tobacco: Never  ?Substance and Sexual Activity  ? Alcohol use: Yes  ?    Alcohol/week: 4.0 - 6.0 standard drinks  ?    Types: 4 - 6 Standard drinks or equivalent per week  ? Drug  use: No  ? Sexual activity: Not Currently  ?    Partners: Male  ?    Birth control/protection: Post-menopausal  ?Other Topics Concern  ? Not on file  ?Social History Narrative  ? Not on file  ?  ?Social Determinants of Health  ?  ?   ?Financial Resource Strain: Low Risk   ? Difficulty of Paying Living Expenses: Not hard at all  ?Food Insecurity: No Food Insecurity  ? Worried About Charity fundraiser in the Last Year: Never true  ? Ran Out of Food in the Last Year: Never true  ?Transportation Needs: No Transportation Needs  ? Lack of Transportation (Medical): No  ? Lack of Transportation (Non-Medical): No  ?Physical Activity: Not on file  ?Stress: Not on file  ?Social Connections: Not on file  ?Intimate Partner Violence: Not on file  ?  ?  ?       ?Prior to Admission medications   ?Medication Sig Start Date End Date Taking? Authorizing Provider  ?acetaminophen (TYLENOL) 500 MG tablet Take 1,000 mg by mouth every 6 (six) hours as needed (pain.).     Yes [provider]  ?amiodarone (PACERONE) 200 MG tablet Take 1 tablet (200 mg total) by mouth daily. ?Patient taking differently: Take 200 mg by mouth every evening. 07/16/21   Yes Belva Crome, MD  ?amoxicillin (AMOXIL) 500 MG capsule Take 2,000 mg by mouth See admin instructions. Take 4 capsules (2000 mg) by mouth 1 hour prior to dental appointments 02/19/19   Yes [provider]  ?apixaban (ELIQUIS) 5 MG TABS tablet Take 1 tablet (5 mg total) by mouth 2 (two) times daily. Resume Apixaban at 8 PM 06/19/2921 ?Patient taking differently: Take 5 mg by mouth 2 (two) times daily. 06/19/21   Yes Belva Crome, MD  ?B-D TB SYRINGE 1CC/27GX1/2" 27G X 1/2" 1 ML MISC Inject into the skin once a week. 08/19/20   Yes [provider]  ?Calcium Carb-Cholecalciferol (CALCIUM + D3 PO) Take 1 tablet by mouth in the morning.     Yes [provider]  ?cholecalciferol (VITAMIN D3) 25 MCG (1000 UNIT) tablet Take 1,000 Units by mouth in the morning.     Yes  [provider]  ?Cyanocobalamin (B-12 PO) Take 1,000 mcg by mouth in the  morning.     Yes [provider]  ?denosumab (PROLIA) 60 MG/ML SOSY injection Inject 60 mg into the skin every 6 (six) months.     Yes [provider]  ?folic acid (FOLVITE) 1 MG tablet Take 2 mg by mouth in the morning.     Yes [provider]  ?letrozole (FEMARA) 2.5 MG tablet Take 1 tablet (2.5 mg total) by mouth daily. ?Patient taking differently: Take 2.5 mg by mouth every evening. 07/30/21   Yes Nicholas Lose, MD  ?levothyroxine (SYNTHROID, LEVOTHROID) 88 MCG tablet Take 88 mcg by mouth daily before breakfast.     Yes [provider]  ?loperamide (IMODIUM) 2 MG capsule Take 2-4 mg by mouth 4 (four) times daily as needed (upset stomach/diarrhea/loose stools).     Yes [provider]  ?methotrexate 50 MG/2ML injection Inject 25 mg into the skin every Wednesday. 07/17/21   Yes [provider]  ?metoprolol succinate (TOPROL XL) 25 MG 24 hr tablet Take 1 tablet (25 mg total) by mouth daily. ?Patient taking differently: Take 25 mg by mouth every evening. 07/16/21   Yes Belva Crome, MD  ?mometasone (ELOCON) 0.1 % ointment Apply topically to vulvar skin no more than twice weekly ?Patient taking differently: Apply 1 application. topically daily as needed (irritation.). 07/17/21   Yes Megan Salon, MD  ?nitroGLYCERIN (NITROSTAT) 0.4 MG SL tablet Place 1 tablet (0.4 mg total) under the tongue every 5 (five) minutes as needed for chest pain. 07/05/20   Yes Belva Crome, MD  ?nystatin cream (MYCOSTATIN) Apply 1 application. topically 2 (two) times daily. Apply to affected area BID for up to 7 days to skin under breasts ?Patient taking differently: Apply 1 application. topically 2 (two) times daily as needed (skin irritation.). 07/17/21   Yes Megan Salon, MD  ?pantoprazole (PROTONIX) 40 MG tablet Take 40 mg by mouth 2 (two) times daily as needed.     Yes [provider]   ?predniSONE (DELTASONE) 1 MG tablet Take 1 mg by mouth daily with breakfast. continuous     Yes [provider]  ?REPATHA SURECLICK 948 MG/ML SOAJ Inject 140 mg into the skin every 14 (fourteen) days. 4/

## 2021-08-18 NOTE — Telephone Encounter (Addendum)
Prolia BIV complete.

## 2021-08-18 NOTE — Anesthesia Preprocedure Evaluation (Addendum)
Anesthesia Evaluation  ?Patient identified by MRN, date of birth, ID band ?Patient awake ? ? ? ?Reviewed: ?Allergy & Precautions, NPO status , Patient's Chart, lab work & pertinent test results ? ?Airway ?Mallampati: II ? ?TM Distance: >3 FB ?Neck ROM: Full ? ? ? Dental ?no notable dental hx. ? ?  ?Pulmonary ?neg pulmonary ROS,  ?  ?Pulmonary exam normal ? ? ? ? ? ? ? Cardiovascular ?hypertension, Pt. on medications and Pt. on home beta blockers ?+ CAD  ?+ dysrhythmias Atrial Fibrillation + Valvular Problems/Murmurs AS  ?Rhythm:Regular Rate:Normal ?+ Systolic murmurs ? ?  ?Neuro/Psych ?negative neurological ROS ? negative psych ROS  ? GI/Hepatic ?Neg liver ROS, GERD  Medicated,  ?Endo/Other  ?Hypothyroidism  ? Renal/GU ?negative Renal ROS  ?negative genitourinary ?  ?Musculoskeletal ? ?(+) Arthritis , PMR on MTX  ? Abdominal ?Normal abdominal exam  (+)   ?Peds ? Hematology ?Lab Results ?     Component                Value               Date                 ?     WBC                      8.1                 08/15/2021           ?     HGB                      12.1                08/15/2021           ?     HCT                      36.3                08/15/2021           ?     MCV                      109.0 (H)           08/15/2021           ?     PLT                      228                 08/15/2021           ?Lab Results ?     Component                Value               Date                 ?     NA                       137                 08/15/2021           ?     K  4.5                 08/15/2021           ?     CO2                      26                  08/15/2021           ?     GLUCOSE                  111 (H)             08/15/2021           ?     BUN                      11                  08/15/2021           ?     CREATININE               0.91                08/15/2021           ?     CALCIUM                  9.5                 08/15/2021            ?     EGFR                     78                  06/13/2021           ?     GFRNONAA                 >60                 08/15/2021             ?Anesthesia Other Findings ? ? Reproductive/Obstetrics ? ?  ? ? ? ? ? ? ? ? ? ? ? ? ? ?  ?  ? ? ? ? ? ? ? ?Anesthesia Physical ?Anesthesia Plan ? ?ASA: 4 ? ?Anesthesia Plan: MAC  ? ?Post-op Pain Management:   ? ?Induction: Intravenous ? ?PONV Risk Score and Plan: 2 and Ondansetron, Dexamethasone, Propofol infusion and Treatment may vary due to age or medical condition ? ?Airway Management Planned: Simple Face Mask, Natural Airway and Nasal Cannula ? ?Additional Equipment: Arterial line ? ?Intra-op Plan:  ? ?Post-operative Plan:  ? ?Informed Consent: I have reviewed the patients History and Physical, chart, labs and discussed the procedure including the risks, benefits and alternatives for the proposed anesthesia with the patient or authorized representative who has indicated his/her understanding and acceptance.  ? ? ? ?Dental advisory given ? ?Plan Discussed with: CRNA ? ?Anesthesia Plan Comments: (ECHO 02/23: ??1. Severe AS (mean gradient 40 mmHg, peak velocity 4 m/s).  ??2. Left ventricular ejection fraction, by estimation, is 60 to 65%. The  ?left ventricle has normal function. The left ventricle has no regional  ?wall motion abnormalities. Left ventricular diastolic parameters are  ?consistent  with Grade II diastolic  ?dysfunction (pseudonormalization).  ??3. Right ventricular systolic function is normal. The right ventricular  ?size is normal. Tricuspid regurgitation signal is inadequate for assessing  ?PA pressure.  ??4. Left atrial size was mild to moderately dilated.  ??5. The mitral valve is normal in structure. Mild mitral valve  ?regurgitation. No evidence of mitral stenosis.  ??6. The aortic valve is calcified. Aortic valve regurgitation is mild.  ?Severe aortic valve stenosis.  ??7. The inferior vena cava is normal in size with greater than 50%   ?respiratory variability, suggesting right atrial pressure of 3 mmHg. )  ? ? ? ? ? ?Anesthesia Quick Evaluation ? ?

## 2021-08-19 ENCOUNTER — Inpatient Hospital Stay (HOSPITAL_COMMUNITY): Payer: Medicare Other | Admitting: Vascular Surgery

## 2021-08-19 ENCOUNTER — Other Ambulatory Visit: Payer: Self-pay | Admitting: Physician Assistant

## 2021-08-19 ENCOUNTER — Inpatient Hospital Stay (HOSPITAL_COMMUNITY): Payer: Medicare Other

## 2021-08-19 ENCOUNTER — Inpatient Hospital Stay (HOSPITAL_COMMUNITY)
Admission: RE | Admit: 2021-08-19 | Discharge: 2021-08-20 | DRG: 267 | Disposition: A | Discharge status: Home / Self Care | Payer: Medicare Other | Attending: Cardiovascular Disease | Admitting: Cardiovascular Disease

## 2021-08-19 ENCOUNTER — Encounter (HOSPITAL_COMMUNITY): Payer: Self-pay | Admitting: Cardiovascular Disease

## 2021-08-19 ENCOUNTER — Inpatient Hospital Stay (HOSPITAL_COMMUNITY): Payer: Medicare Other | Admitting: General Practice

## 2021-08-19 ENCOUNTER — Other Ambulatory Visit: Payer: Self-pay

## 2021-08-19 ENCOUNTER — Encounter (HOSPITAL_COMMUNITY)
Admission: RE | Disposition: A | Discharge status: Home / Self Care | Payer: Self-pay | Source: Home / Self Care | Attending: Cardiovascular Disease

## 2021-08-19 DIAGNOSIS — Z9842 Cataract extraction status, left eye: Secondary | ICD-10-CM | POA: Diagnosis not present

## 2021-08-19 DIAGNOSIS — M353 Polymyalgia rheumatica: Secondary | ICD-10-CM

## 2021-08-19 DIAGNOSIS — K219 Gastro-esophageal reflux disease without esophagitis: Secondary | ICD-10-CM | POA: Diagnosis not present

## 2021-08-19 DIAGNOSIS — Z96653 Presence of artificial knee joint, bilateral: Secondary | ICD-10-CM | POA: Diagnosis present

## 2021-08-19 DIAGNOSIS — I2510 Atherosclerotic heart disease of native coronary artery without angina pectoris: Secondary | ICD-10-CM

## 2021-08-19 DIAGNOSIS — Z79811 Long term (current) use of aromatase inhibitors: Secondary | ICD-10-CM | POA: Diagnosis not present

## 2021-08-19 DIAGNOSIS — Z9841 Cataract extraction status, right eye: Secondary | ICD-10-CM | POA: Diagnosis not present

## 2021-08-19 DIAGNOSIS — C50512 Malignant neoplasm of lower-outer quadrant of left female breast: Secondary | ICD-10-CM | POA: Diagnosis present

## 2021-08-19 DIAGNOSIS — Z952 Presence of prosthetic heart valve: Principal | ICD-10-CM

## 2021-08-19 DIAGNOSIS — I350 Nonrheumatic aortic (valve) stenosis: Secondary | ICD-10-CM

## 2021-08-19 DIAGNOSIS — Z79890 Hormone replacement therapy (postmenopausal): Secondary | ICD-10-CM

## 2021-08-19 DIAGNOSIS — Z7901 Long term (current) use of anticoagulants: Secondary | ICD-10-CM | POA: Diagnosis not present

## 2021-08-19 DIAGNOSIS — E7800 Pure hypercholesterolemia, unspecified: Secondary | ICD-10-CM | POA: Diagnosis present

## 2021-08-19 DIAGNOSIS — Z832 Family history of diseases of the blood and blood-forming organs and certain disorders involving the immune mechanism: Secondary | ICD-10-CM

## 2021-08-19 DIAGNOSIS — Z79631 Long term (current) use of antimetabolite agent: Secondary | ICD-10-CM

## 2021-08-19 DIAGNOSIS — E8770 Fluid overload, unspecified: Secondary | ICD-10-CM | POA: Diagnosis present

## 2021-08-19 DIAGNOSIS — I447 Left bundle-branch block, unspecified: Secondary | ICD-10-CM | POA: Diagnosis not present

## 2021-08-19 DIAGNOSIS — I480 Paroxysmal atrial fibrillation: Secondary | ICD-10-CM | POA: Diagnosis not present

## 2021-08-19 DIAGNOSIS — Z87410 Personal history of cervical dysplasia: Secondary | ICD-10-CM | POA: Diagnosis not present

## 2021-08-19 DIAGNOSIS — Z8249 Family history of ischemic heart disease and other diseases of the circulatory system: Secondary | ICD-10-CM | POA: Diagnosis not present

## 2021-08-19 DIAGNOSIS — I272 Pulmonary hypertension, unspecified: Secondary | ICD-10-CM | POA: Diagnosis present

## 2021-08-19 DIAGNOSIS — Z170 Estrogen receptor positive status [ER+]: Secondary | ICD-10-CM

## 2021-08-19 DIAGNOSIS — Z803 Family history of malignant neoplasm of breast: Secondary | ICD-10-CM | POA: Diagnosis not present

## 2021-08-19 DIAGNOSIS — Z8616 Personal history of COVID-19: Secondary | ICD-10-CM | POA: Diagnosis not present

## 2021-08-19 DIAGNOSIS — L900 Lichen sclerosus et atrophicus: Secondary | ICD-10-CM | POA: Diagnosis present

## 2021-08-19 DIAGNOSIS — Z7952 Long term (current) use of systemic steroids: Secondary | ICD-10-CM

## 2021-08-19 DIAGNOSIS — R059 Cough, unspecified: Secondary | ICD-10-CM | POA: Diagnosis not present

## 2021-08-19 DIAGNOSIS — E039 Hypothyroidism, unspecified: Secondary | ICD-10-CM | POA: Diagnosis not present

## 2021-08-19 DIAGNOSIS — I10 Essential (primary) hypertension: Secondary | ICD-10-CM

## 2021-08-19 DIAGNOSIS — M1990 Unspecified osteoarthritis, unspecified site: Secondary | ICD-10-CM | POA: Diagnosis present

## 2021-08-19 DIAGNOSIS — I25118 Atherosclerotic heart disease of native coronary artery with other forms of angina pectoris: Secondary | ICD-10-CM | POA: Diagnosis present

## 2021-08-19 DIAGNOSIS — R5082 Postprocedural fever: Secondary | ICD-10-CM | POA: Diagnosis not present

## 2021-08-19 DIAGNOSIS — I4891 Unspecified atrial fibrillation: Secondary | ICD-10-CM | POA: Diagnosis not present

## 2021-08-19 DIAGNOSIS — Z006 Encounter for examination for normal comparison and control in clinical research program: Secondary | ICD-10-CM | POA: Diagnosis not present

## 2021-08-19 DIAGNOSIS — R509 Fever, unspecified: Secondary | ICD-10-CM | POA: Diagnosis not present

## 2021-08-19 DIAGNOSIS — I440 Atrioventricular block, first degree: Secondary | ICD-10-CM | POA: Diagnosis not present

## 2021-08-19 HISTORY — PX: TRANSCATHETER AORTIC VALVE REPLACEMENT, TRANSFEMORAL: SHX6400

## 2021-08-19 HISTORY — DX: Paroxysmal atrial fibrillation: I48.0

## 2021-08-19 HISTORY — PX: INTRAOPERATIVE TRANSTHORACIC ECHOCARDIOGRAM: SHX6523

## 2021-08-19 HISTORY — DX: Presence of prosthetic heart valve: Z95.2

## 2021-08-19 LAB — POCT I-STAT, CHEM 8
BUN: 8 mg/dL (ref 8–23)
BUN: 8 mg/dL (ref 8–23)
Calcium, Ion: 1.18 mmol/L (ref 1.15–1.40)
Calcium, Ion: 1.22 mmol/L (ref 1.15–1.40)
Chloride: 112 mmol/L — ABNORMAL HIGH (ref 98–111)
Chloride: 110 mmol/L (ref 98–111)
Creatinine, Ser: 0.7 mg/dL (ref 0.44–1.00)
Creatinine, Ser: 0.7 mg/dL (ref 0.44–1.00)
Glucose, Bld: 131 mg/dL — ABNORMAL HIGH (ref 70–99)
Glucose, Bld: 132 mg/dL — ABNORMAL HIGH (ref 70–99)
HCT: 29 % — ABNORMAL LOW (ref 36.0–46.0)
HCT: 30 % — ABNORMAL LOW (ref 36.0–46.0)
Hemoglobin: 9.9 g/dL — ABNORMAL LOW (ref 12.0–15.0)
Hemoglobin: 10.2 g/dL — ABNORMAL LOW (ref 12.0–15.0)
Potassium: 3.9 mmol/L (ref 3.5–5.1)
Potassium: 4 mmol/L (ref 3.5–5.1)
Sodium: 143 mmol/L (ref 135–145)
Sodium: 144 mmol/L (ref 135–145)
TCO2: 22 mmol/L (ref 22–32)
TCO2: 24 mmol/L (ref 22–32)

## 2021-08-19 LAB — ECHOCARDIOGRAM LIMITED
AR max vel: 2.58 cm2
AV Area VTI: 2.29 cm2
AV Area mean vel: 2.44 cm2
AV Mean grad: 4 mmHg
AV Peak grad: 8.4 mmHg
Ao pk vel: 1.45 m/s

## 2021-08-19 SURGERY — IMPLANTATION, AORTIC VALVE, TRANSCATHETER, FEMORAL APPROACH
Anesthesia: Monitor Anesthesia Care | Site: Chest

## 2021-08-19 MED ORDER — TRAMADOL HCL 50 MG PO TABS: 50.0000 mg | ORAL_TABLET | ORAL | Status: DC | PRN

## 2021-08-19 MED ORDER — HEPARIN 6000 UNIT IRRIGATION SOLUTION
Status: AC
  Filled 2021-08-19: qty 1500

## 2021-08-19 MED ORDER — ZOLPIDEM TARTRATE 5 MG PO TABS
2.5000 mg | ORAL_TABLET | Freq: Every day | ORAL | Status: DC
  Administered 2021-08-19: 2.5 mg via ORAL
  Filled 2021-08-19: qty 1

## 2021-08-19 MED ORDER — ACETAMINOPHEN 325 MG PO TABS
650.0000 mg | ORAL_TABLET | Freq: Four times a day (QID) | ORAL | Status: DC | PRN
  Administered 2021-08-19 – 2021-08-20 (×3): 650 mg via ORAL
  Filled 2021-08-19 (×3): qty 2

## 2021-08-19 MED ORDER — CHLORHEXIDINE GLUCONATE 0.12 % MT SOLN: 15.0000 mL | Freq: Once | OROMUCOSAL | Status: AC

## 2021-08-19 MED ORDER — CHLORHEXIDINE GLUCONATE 0.12 % MT SOLN: 15.0000 mL | Freq: Once | OROMUCOSAL | Status: DC

## 2021-08-19 MED ORDER — DIPHENHYDRAMINE HCL 50 MG/ML IJ SOLN
INTRAMUSCULAR | Status: AC
  Filled 2021-08-19: qty 1

## 2021-08-19 MED ORDER — SODIUM CHLORIDE 0.9 % IV SOLN: INTRAVENOUS | Status: DC

## 2021-08-19 MED ORDER — HEPARIN 6000 UNIT IRRIGATION SOLUTION
Status: DC | PRN
  Administered 2021-08-19 (×3): 1

## 2021-08-19 MED ORDER — NOREPINEPHRINE 4 MG/250ML-% IV SOLN
INTRAVENOUS | Status: AC
  Filled 2021-08-19: qty 250

## 2021-08-19 MED ORDER — IODIXANOL 320 MG/ML IV SOLN
INTRAVENOUS | Status: DC | PRN
  Administered 2021-08-19: 40 mL via INTRA_ARTERIAL

## 2021-08-19 MED ORDER — PROTAMINE SULFATE 10 MG/ML IV SOLN
INTRAVENOUS | Status: AC
  Filled 2021-08-19: qty 25

## 2021-08-19 MED ORDER — PHENYLEPHRINE 80 MCG/ML (10ML) SYRINGE FOR IV PUSH (FOR BLOOD PRESSURE SUPPORT)
PREFILLED_SYRINGE | INTRAVENOUS | Status: AC
  Filled 2021-08-19: qty 10

## 2021-08-19 MED ORDER — FENTANYL CITRATE (PF) 250 MCG/5ML IJ SOLN
INTRAMUSCULAR | Status: DC | PRN
  Administered 2021-08-19 (×2): 25 ug via INTRAVENOUS

## 2021-08-19 MED ORDER — NITROGLYCERIN IN D5W 200-5 MCG/ML-% IV SOLN: 0.0000 ug/min | INTRAVENOUS | Status: DC

## 2021-08-19 MED ORDER — ONDANSETRON HCL 4 MG/2ML IJ SOLN
INTRAMUSCULAR | Status: AC
Start: 2021-08-19 — End: ?
  Filled 2021-08-19: qty 2

## 2021-08-19 MED ORDER — CHLORHEXIDINE GLUCONATE 0.12 % MT SOLN
OROMUCOSAL | Status: AC
  Administered 2021-08-19: 15 mL via OROMUCOSAL
  Filled 2021-08-19: qty 15

## 2021-08-19 MED ORDER — NOREPINEPHRINE BITARTRATE 1 MG/ML IV SOLN
0.0000 ug/min | INTRAVENOUS | Status: DC
  Administered 2021-08-19: 15 ug/min via INTRAVENOUS

## 2021-08-19 MED ORDER — ONDANSETRON HCL 4 MG/2ML IJ SOLN
INTRAMUSCULAR | Status: DC | PRN
  Administered 2021-08-19: 4 mg via INTRAVENOUS

## 2021-08-19 MED ORDER — PROPOFOL 500 MG/50ML IV EMUL
INTRAVENOUS | Status: DC | PRN
  Administered 2021-08-19: 20 ug/kg/min via INTRAVENOUS

## 2021-08-19 MED ORDER — CHLORHEXIDINE GLUCONATE 4 % EX LIQD: 60.0000 mL | Freq: Once | CUTANEOUS | Status: DC

## 2021-08-19 MED ORDER — LIDOCAINE HCL 1 % IJ SOLN
INTRAMUSCULAR | Status: DC | PRN
  Administered 2021-08-19: 15 mL

## 2021-08-19 MED ORDER — LIDOCAINE HCL (PF) 1 % IJ SOLN
INTRAMUSCULAR | Status: AC
  Filled 2021-08-19: qty 30

## 2021-08-19 MED ORDER — ACETAMINOPHEN 650 MG RE SUPP: 650.0000 mg | Freq: Four times a day (QID) | RECTAL | Status: DC | PRN

## 2021-08-19 MED ORDER — SODIUM CHLORIDE 0.9 % IR SOLN
Status: DC | PRN
  Administered 2021-08-19: 1000 mL

## 2021-08-19 MED ORDER — LEVOTHYROXINE SODIUM 88 MCG PO TABS
88.0000 ug | ORAL_TABLET | Freq: Every day | ORAL | Status: DC
  Administered 2021-08-20: 88 ug via ORAL
  Filled 2021-08-19: qty 1

## 2021-08-19 MED ORDER — LETROZOLE 2.5 MG PO TABS
2.5000 mg | ORAL_TABLET | Freq: Every evening | ORAL | Status: DC
  Administered 2021-08-19: 2.5 mg via ORAL
  Filled 2021-08-19 (×2): qty 1

## 2021-08-19 MED ORDER — AMIODARONE HCL 200 MG PO TABS
200.0000 mg | ORAL_TABLET | Freq: Every evening | ORAL | Status: DC
Start: 2021-08-19 — End: 2021-08-20
  Administered 2021-08-19: 200 mg via ORAL
  Filled 2021-08-19: qty 1

## 2021-08-19 MED ORDER — CLEVIDIPINE BUTYRATE 0.5 MG/ML IV EMUL
0.0000 mg/h | INTRAVENOUS | Status: AC
  Filled 2021-08-19 (×2): qty 50

## 2021-08-19 MED ORDER — SODIUM CHLORIDE 0.9% FLUSH: 3.0000 mL | INTRAVENOUS | Status: DC | PRN

## 2021-08-19 MED ORDER — LACTATED RINGERS IV SOLN: INTRAVENOUS | Status: DC

## 2021-08-19 MED ORDER — LACTATED RINGERS IV SOLN: INTRAVENOUS | Status: DC | PRN

## 2021-08-19 MED ORDER — FENTANYL CITRATE (PF) 250 MCG/5ML IJ SOLN
INTRAMUSCULAR | Status: AC
  Filled 2021-08-19: qty 5

## 2021-08-19 MED ORDER — MORPHINE SULFATE (PF) 2 MG/ML IV SOLN: 1.0000 mg | INTRAVENOUS | Status: DC | PRN

## 2021-08-19 MED ORDER — PROTAMINE SULFATE 10 MG/ML IV SOLN
INTRAVENOUS | Status: DC | PRN
  Administered 2021-08-19: 120 mg via INTRAVENOUS

## 2021-08-19 MED ORDER — HEPARIN SODIUM (PORCINE) 1000 UNIT/ML IJ SOLN
INTRAMUSCULAR | Status: DC | PRN
  Administered 2021-08-19: 12000 [IU] via INTRAVENOUS

## 2021-08-19 MED ORDER — OXYCODONE HCL 5 MG PO TABS: 5.0000 mg | ORAL_TABLET | ORAL | Status: DC | PRN

## 2021-08-19 MED ORDER — CEFAZOLIN SODIUM-DEXTROSE 2-4 GM/100ML-% IV SOLN
2.0000 g | Freq: Three times a day (TID) | INTRAVENOUS | Status: AC
  Administered 2021-08-19 (×2): 2 g via INTRAVENOUS
  Filled 2021-08-19 (×2): qty 100

## 2021-08-19 MED ORDER — SODIUM CHLORIDE 0.9% FLUSH
3.0000 mL | Freq: Two times a day (BID) | INTRAVENOUS | Status: DC
  Administered 2021-08-20: 3 mL via INTRAVENOUS

## 2021-08-19 MED ORDER — SODIUM CHLORIDE 0.9 % IV SOLN: 250.0000 mL | INTRAVENOUS | Status: DC | PRN

## 2021-08-19 MED ORDER — CHLORHEXIDINE GLUCONATE 4 % EX LIQD: 30.0000 mL | CUTANEOUS | Status: DC

## 2021-08-19 MED ORDER — DIPHENHYDRAMINE HCL 50 MG/ML IJ SOLN
INTRAMUSCULAR | Status: DC | PRN
  Administered 2021-08-19: 10 mg via INTRAVENOUS

## 2021-08-19 MED ORDER — PREDNISONE 1 MG PO TABS
1.0000 mg | ORAL_TABLET | Freq: Every day | ORAL | Status: DC
  Administered 2021-08-20: 1 mg via ORAL
  Filled 2021-08-19: qty 1

## 2021-08-19 MED ORDER — SODIUM CHLORIDE 0.9 % IV SOLN
INTRAVENOUS | Status: AC
  Administered 2021-08-19: 50 mL/h via INTRAVENOUS

## 2021-08-19 MED ORDER — PHENYLEPHRINE 80 MCG/ML (10ML) SYRINGE FOR IV PUSH (FOR BLOOD PRESSURE SUPPORT)
PREFILLED_SYRINGE | INTRAVENOUS | Status: DC | PRN
  Administered 2021-08-19: 80 ug via INTRAVENOUS
  Administered 2021-08-19: 160 ug via INTRAVENOUS
  Administered 2021-08-19: 80 ug via INTRAVENOUS

## 2021-08-19 MED ORDER — PROPOFOL 10 MG/ML IV BOLUS
INTRAVENOUS | Status: AC
  Filled 2021-08-19: qty 20

## 2021-08-19 MED ORDER — ONDANSETRON HCL 4 MG/2ML IJ SOLN: 4.0000 mg | Freq: Four times a day (QID) | INTRAMUSCULAR | Status: DC | PRN

## 2021-08-19 MED ORDER — ORAL CARE MOUTH RINSE: 15.0000 mL | Freq: Once | OROMUCOSAL | Status: AC

## 2021-08-19 MED ORDER — HEPARIN SODIUM (PORCINE) 1000 UNIT/ML IJ SOLN
INTRAMUSCULAR | Status: AC
  Filled 2021-08-19: qty 20

## 2021-08-19 SURGICAL SUPPLY — 59 items
BAG COUNTER SPONGE SURGICOUNT (BAG) ×3 IMPLANT
BAG DECANTER FOR FLEXI CONT (MISCELLANEOUS) IMPLANT
BLADE CLIPPER SURG (BLADE) IMPLANT
BLADE OSCILLATING /SAGITTAL (BLADE) IMPLANT
BLADE STERNUM SYSTEM 6 (BLADE) IMPLANT
CABLE ADAPT CONN TEMP 6FT (ADAPTER) ×3 IMPLANT
CABLE ADAPT PACING TEMP 12FT (ADAPTER) ×1 IMPLANT
CATH DIAG EXPO 6F AL1 (CATHETERS) ×1 IMPLANT
CATH DIAG EXPO 6F VENT PIG 145 (CATHETERS) ×6 IMPLANT
CATH INFINITI 6F AL2 (CATHETERS) IMPLANT
CATH S G BIP PACING (CATHETERS) ×3 IMPLANT
CHLORAPREP W/TINT 26 (MISCELLANEOUS) ×3 IMPLANT
CLOSURE MYNX CONTROL 6F/7F (Vascular Products) ×1 IMPLANT
CNTNR URN SCR LID CUP LEK RST (MISCELLANEOUS) ×4 IMPLANT
CONT SPEC 4OZ STRL OR WHT (MISCELLANEOUS) ×6
COVER BACK TABLE 80X110 HD (DRAPES) ×3 IMPLANT
DECANTER SPIKE VIAL GLASS SM (MISCELLANEOUS) ×3 IMPLANT
DERMABOND ADVANCED (GAUZE/BANDAGES/DRESSINGS) ×2
DERMABOND ADVANCED .7 DNX12 (GAUZE/BANDAGES/DRESSINGS) ×2 IMPLANT
DEVICE CLOSURE PERCLS PRGLD 6F (VASCULAR PRODUCTS) ×4 IMPLANT
DRSG TEGADERM 4X4.75 (GAUZE/BANDAGES/DRESSINGS) ×6 IMPLANT
ELECT REM PT RETURN 9FT ADLT (ELECTROSURGICAL) ×3
ELECTRODE REM PT RTRN 9FT ADLT (ELECTROSURGICAL) ×2 IMPLANT
GAUZE SPONGE 2X2 8PLY NS (GAUZE/BANDAGES/DRESSINGS) ×2 IMPLANT
GAUZE SPONGE 4X4 12PLY STRL (GAUZE/BANDAGES/DRESSINGS) ×3 IMPLANT
GLOVE BIO SURGEON STRL SZ7.5 (GLOVE) ×3 IMPLANT
GLOVE BIO SURGEON STRL SZ8 (GLOVE) IMPLANT
GLOVE ORTHO TXT STRL SZ7.5 (GLOVE) IMPLANT
GOWN STRL REUS W/ TWL LRG LVL3 (GOWN DISPOSABLE) IMPLANT
GOWN STRL REUS W/ TWL XL LVL3 (GOWN DISPOSABLE) ×2 IMPLANT
GOWN STRL REUS W/TWL LRG LVL3 (GOWN DISPOSABLE)
GOWN STRL REUS W/TWL XL LVL3 (GOWN DISPOSABLE) ×3
GUIDEWIRE SAFE TJ AMPLATZ EXST (WIRE) ×3 IMPLANT
KIT BASIN OR (CUSTOM PROCEDURE TRAY) ×3 IMPLANT
KIT HEART LEFT (KITS) ×3 IMPLANT
KIT SAPIAN 3 ULTRA RESILIA 23 (Valve) ×1 IMPLANT
KIT TURNOVER KIT B (KITS) ×3 IMPLANT
NS IRRIG 1000ML POUR BTL (IV SOLUTION) ×3 IMPLANT
PACK ENDO MINOR (CUSTOM PROCEDURE TRAY) ×3 IMPLANT
PAD ARMBOARD 7.5X6 YLW CONV (MISCELLANEOUS) ×6 IMPLANT
PAD ELECT DEFIB RADIOL ZOLL (MISCELLANEOUS) ×3 IMPLANT
PERCLOSE PROGLIDE 6F (VASCULAR PRODUCTS) ×6
POSITIONER HEAD DONUT 9IN (MISCELLANEOUS) ×3 IMPLANT
SET MICROPUNCTURE 5F STIFF (MISCELLANEOUS) ×3 IMPLANT
SHEATH BRITE TIP 7FR 35CM (SHEATH) ×3 IMPLANT
SHEATH PINNACLE 6F 10CM (SHEATH) ×3 IMPLANT
SHEATH PINNACLE 8F 10CM (SHEATH) ×3 IMPLANT
SLEEVE REPOSITIONING LENGTH 30 (MISCELLANEOUS) ×3 IMPLANT
STOPCOCK MORSE 400PSI 3WAY (MISCELLANEOUS) ×6 IMPLANT
SUT PROLENE 6 0 C 1 30 (SUTURE) IMPLANT
SUT SILK  1 MH (SUTURE) ×3
SUT SILK 1 MH (SUTURE) ×2 IMPLANT
SYR 50ML LL SCALE MARK (SYRINGE) ×3 IMPLANT
SYR BULB IRRIG 60ML STRL (SYRINGE) IMPLANT
TOWEL GREEN STERILE (TOWEL DISPOSABLE) ×6 IMPLANT
TRANSDUCER W/STOPCOCK (MISCELLANEOUS) ×6 IMPLANT
WIRE EMERALD 3MM-J .035X150CM (WIRE) ×3 IMPLANT
WIRE EMERALD 3MM-J .035X260CM (WIRE) ×3 IMPLANT
WIRE SAFARI SM CURVE 275 (WIRE) ×1 IMPLANT

## 2021-08-19 NOTE — Anesthesia Procedure Notes (Signed)
Procedure Name: Point ?Date/Time: 08/19/2021 7:36 AM ?Performed by: Dorann Lodge, CRNA ?Pre-anesthesia Checklist: Patient identified, Emergency Drugs available, Suction available and Patient being monitored ?Patient Re-evaluated:Patient Re-evaluated prior to induction ?Oxygen Delivery Method: Simple face mask ?Induction Type: IV induction ? ? ? ? ?

## 2021-08-19 NOTE — Op Note (Addendum)
?[facility]   ?MULTIDISCIPLINARY HEART VALVE TEAM ? ? ?TAVR OPERATIVE NOTE ? ? ?Date of Procedure:  08/19/2021 ? ?Preoperative Diagnosis: Severe Aortic Stenosis  ? ?Postoperative Diagnosis: Same  ? ?Procedure:  ? ?Transcatheter Aortic Valve Replacement - Percutaneous Left Transfemoral Approach ? Edwards Sapien 3 Ultra Resilia THV (size 23 mm, model # 9755RSL, serial # [id number]) ?  ?Co-Surgeons:  Gaye Pollack, MD and Lauree Chandler, MD ? ? ?Anesthesiologist:  G. Stoltzfus, DO ? ?Echocardiographer:  Osborne Oman, MD ? ?Pre-operative Echo Findings: ?Severe aortic stenosis ?Normal left ventricular systolic function ? ?Post-operative Echo Findings: ?No paravalvular leak ?Normal left ventricular systolic function ? ? ?BRIEF CLINICAL NOTE AND INDICATIONS FOR SURGERY ? ?This 81 year old woman has stage D, severe, symptomatic aortic stenosis with New York Heart Association class II symptoms of exertional fatigue and shortness of breath consistent with chronic diastolic congestive heart failure.  I have personally reviewed her 2D echocardiogram, cardiac catheterization, and CTA studies.  Her echocardiogram shows a calcified aortic valve with a mean gradient of 40 mmHg and a valve area of 0.85 cm? consistent with severe aortic stenosis.  Left ventricular ejection fraction is normal.  Cardiac catheterization showed 80% stenosis of a diagonal branch.  Otherwise there were no significant stenoses in the major coronary arteries.  The mean gradient across the aortic valve was 50 mmHg with a valve area of 0.89 cm? consistent with severe aortic stenosis.  I agree that aortic valve replacement is indicated in this patient for relief of her symptoms and to prevent left ventricular dysfunction.  She has also been recently diagnosed with left breast cancer and is going to require a mastectomy.  Her aortic stenosis needs to be treated prior to that surgery.  Given her age I think that transcatheter aortic valve  replacement would be the best option for treating her.  Her gated cardiac CTA shows anatomy suitable for TAVR using a SAPIEN 3 valve.  Her abdominal and pelvic CTA shows adequate pelvic vascular anatomy to allow transfemoral insertion. ?  ?The patient and her son were counseled at length regarding treatment alternatives for management of severe symptomatic aortic stenosis. The risks and benefits of surgical intervention has been discussed in detail. Long-term prognosis with medical therapy was discussed. Alternative approaches such as conventional surgical aortic valve replacement, transcatheter aortic valve replacement, and palliative medical therapy were compared and contrasted at length. This discussion was placed in the context of the patient's own specific clinical presentation and past medical history. All of their questions have been addressed.  ?  ?Following the decision to proceed with transcatheter aortic valve replacement, a discussion was held regarding what types of management strategies would be attempted intraoperatively in the event of life-threatening complications, including whether or not the patient would be considered a candidate for the use of cardiopulmonary bypass and/or conversion to open sternotomy for attempted surgical intervention.  She is in overall good medical condition for her age and still very active and I think would be a candidate for emergent sternotomy to manage any intraoperative complications.  The patient is aware of the fact that transient use of cardiopulmonary bypass may be necessary. The patient has been advised of a variety of complications that might develop including but not limited to risks of death, stroke, paravalvular leak, aortic dissection or other major vascular complications, aortic annulus rupture, device embolization, cardiac rupture or perforation, mitral regurgitation, acute myocardial infarction, arrhythmia, heart block or bradycardia requiring permanent  pacemaker placement, congestive heart  failure, respiratory failure, renal failure, pneumonia, infection, other late complications related to structural valve deterioration or migration, or other complications that might ultimately cause a temporary or permanent loss of functional independence or other long term morbidity. The patient provides full informed consent for the procedure as described and all questions were answered.  ? ? ? ?DETAILS OF THE OPERATIVE PROCEDURE ? ?PREPARATION:   ? ?The patient was brought to the operating room on the above mentioned date and appropriate monitoring was established by the anesthesia team. The patient was placed in the supine position on the operating table.  Intravenous antibiotics were administered. The patient was monitored closely throughout the procedure under conscious sedation.  ?Baseline transthoracic echocardiogram was performed. The patient's abdomen and both groins were prepped and draped in a sterile manner. A time out procedure was performed. ? ? ?PERIPHERAL ACCESS:   ? ?Using the modified Seldinger technique, femoral arterial and venous access was obtained with placement of 6 Fr sheaths on the right side.  A pigtail diagnostic catheter was passed through the right arterial sheath under fluoroscopic guidance into the aortic root.  A temporary transvenous pacemaker catheter was passed through the right femoral venous sheath under fluoroscopic guidance into the right ventricle.  The pacemaker was tested to ensure stable lead placement and pacemaker capture. Aortic root angiography was performed in order to determine the optimal angiographic angle for valve deployment. ? ? ?TRANSFEMORAL ACCESS:  ? ?Percutaneous transfemoral access and sheath placement was performed using ultrasound guidance.  The left common femoral artery was cannulated using a micropuncture needle and appropriate location was verified using hand injection angiogram.  A pair of Abbott Perclose  percutaneous closure devices were placed and a 6 French sheath replaced into the femoral artery.  The patient was heparinized systemically and ACT verified > 250 seconds.   ? ?A 14 Fr transfemoral E-sheath was introduced into the left common femoral artery after progressively dilating over an Amplatz superstiff wire. An AL-1 catheter was used to direct a straight-tip exchange length wire across the native aortic valve into the left ventricle. This was exchanged out for a pigtail catheter and position was confirmed in the LV apex. Simultaneous LV and Ao pressures were recorded.  The pigtail catheter was exchanged for a Safari wire in the LV apex.  Echocardiography was utilized to confirm appropriate wire position and no sign of entanglement in the mitral subvalvular apparatus. ? ? ?BALLOON AORTIC VALVULOPLASTY:  ? ?Not performed ? ? ?TRANSCATHETER HEART VALVE DEPLOYMENT:  ? ?An Edwards Sapien 3 Ultra transcatheter heart valve (size 23 mm) was prepared and crimped per manufacturer's guidelines, and the proper orientation of the valve is confirmed on the [employer] delivery system. The valve was advanced through the introducer sheath using normal technique until in an appropriate position in the abdominal aorta beyond the sheath tip. The balloon was then retracted and using the fine-tuning wheel was centered on the valve. The valve was then advanced across the aortic arch using appropriate flexion of the catheter. The valve was carefully positioned across the aortic valve annulus. The Commander catheter was retracted using normal technique. Once final position of the valve has been confirmed by angiographic assessment, the valve is deployed while temporarily holding ventilation and during rapid ventricular pacing to maintain systolic blood pressure < 50 mmHg and pulse pressure < 10 mmHg. The balloon inflation is held for >3 seconds after reaching full deployment volume. Once the balloon has fully deflated the  balloon is retracted into  the ascending aorta and valve function is assessed using echocardiography. There is felt to be no paravalvular leak and no central aortic insufficiency.  The patient's hemodynamic re

## 2021-08-19 NOTE — Progress Notes (Signed)
Mobility Specialist Progress Note ? ? 08/19/21 1441  ?Mobility  ?Activity Ambulated with assistance in hallway  ?Level of Assistance Contact guard assist, steadying assist  ?Assistive Device None  ?Distance Ambulated (ft) 390 ft  ?Activity Response Tolerated well  ?$Mobility charge 1 Mobility  ? ?Pre Mobility: 74 HR, 141/110 BP, 95% SpO2 ?During Mobility: 89 HR, 89% SpO2 ?Post Mobility: 73 HR, 156/82 BP, 91% SpO2 ? ?Received in bed c/o being extremely cold and shaking but agreeable. No reported pain prior, during or post ambulation. Pt feeling a little unstable upon first couple of steps but no LOB throughout. X1 standing rest break d/t SOB but SpO2 reading 89% on portable pulse ox. Returned back to bed and placed call bell by side. Groin sites remained stable and RN was notified.  ? ?Holland Falling ?Mobility Specialist ?Phone Number [phone] ? ?

## 2021-08-19 NOTE — Discharge Instructions (Signed)

## 2021-08-19 NOTE — Anesthesia Procedure Notes (Signed)
Arterial Line Insertion ?Start/End5/12/2021 7:00 AM, 08/19/2021 7:05 AM ?Performed by: Dorann Lodge, CRNA, CRNA ? Patient location: Pre-op. ?Preanesthetic checklist: patient identified, IV checked, site marked, risks and benefits discussed, surgical consent, monitors and equipment checked, pre-op evaluation, timeout performed and anesthesia consent ?Lidocaine 1% used for infiltration ?Right, radial was placed ?Catheter size: 20 G ?Hand hygiene performed  and maximum sterile barriers used  ? ?Attempts: 1 ?Procedure performed without using ultrasound guided technique. ?Following insertion, dressing applied and Biopatch. ?Post procedure assessment: normal and unchanged ? ?Patient tolerated the procedure well with no immediate complications. ? ? ?

## 2021-08-19 NOTE — Progress Notes (Signed)
?  [facility]   ?MULTIDISCIPLINARY HEART VALVE TEAM ? ?Patient doing well s/p TAVR. She is hemodynamically stable. Groin sites stable. ECG with sinus brady with new 1st deg AV block and LBBB but no high grade block. Arterial line discontinued and transferred to 4E. Plan for early ambulation after bedrest completed and hopeful discharge over the next 24-48 hours.  ? ?Angelena Form PA-C [facility]  ?Pager [phone] ? ?

## 2021-08-19 NOTE — Anesthesia Postprocedure Evaluation (Signed)
Anesthesia Post Note ? ?Patient: SHANNEN FLANSBURG ? ?Procedure(s) Performed: Transcatheter Aortic Valve Replacement, Transfemoral Using 57m Sapien 3 Ultra Edwards Valve (Chest) ?INTRAOPERATIVE TRANSTHORACIC ECHOCARDIOGRAM ? ?  ? ?Patient location during evaluation: PACU ?Anesthesia Type: MAC ?Level of consciousness: awake and alert ?Pain management: pain level controlled ?Vital Signs Assessment: post-procedure vital signs reviewed and stable ?Respiratory status: spontaneous breathing, nonlabored ventilation, respiratory function stable and patient connected to nasal cannula oxygen ?Cardiovascular status: stable and blood pressure returned to baseline ?Postop Assessment: no apparent nausea or vomiting ?Anesthetic complications: no ? ? ?No notable events documented. ? ?Last Vitals:  ?Vitals:  ? 08/19/21 1245 08/19/21 1300  ?BP: 102/77 (!) 134/57  ?Pulse: (!) 55 (!) 54  ?Resp: 11 18  ?Temp:    ?SpO2: 97% 96%  ?  ?Last Pain:  ?Vitals:  ? 08/19/21 1204  ?TempSrc: Oral  ?PainSc: 0-No pain  ? ? ?  ?  ?  ?  ?  ?  ? ?GMarch RummageStoltzfus ? ? ? ? ?

## 2021-08-19 NOTE — CV Procedure (Signed)
[facility]  TAVR OPERATIVE NOTE   Date of Procedure:  08/19/2021  Preoperative Diagnosis: Severe Aortic Stenosis   Postoperative Diagnosis: Same   Procedure:   Transcatheter Aortic Valve Replacement - Transfemoral Approach  Edwards Sapien 3 THV (size 23 mm, model # [id number] serial # [id number])   Co-Surgeons:  Verne Carrow, MD and Alleen Borne, MD  Anesthesiologist:  Stoltzfus  Echocardiographer:  Izora Ribas  Pre-operative Echo Findings: Severe aortic stenosis Normal left ventricular systolic function  Post-operative Echo Findings: No paravalvular leak Normal left ventricular systolic function  BRIEF CLINICAL NOTE AND INDICATIONS FOR SURGERY  81 yo female with history of mild CAD, arthritis, polymyalgia rheumatica, paroxysmal atrial fibrillation, GERD, hyperlipidemia, HTN, hypothyroidism breast cancer and severe aortic stenosis here today for TAVR. She has been followed by Dr. Katrinka Blazing for moderate aortic stenosis. She has recently reported worsened dyspnea on exertion and progressive fatigue when walking. Echo 06/09/21 with LVEF=60-65%, grade II diastolic dysfunction. Normal RV size and function. Mild mitral regurgitation. The aortic valve leaflets are thickened and calcified with restricted leaflet excursion. Mean gradient 40 mmHg, peak gradient 63 mmHg, AVA 0.76 cm2. This is consistent with severe aortic stenosis. Cardiac cath 06/19/21 with widely patent LAD, Circumflex and RCA. Moderate disease in the Diagonal branch. Medical management of CAD planned by Dr. Katrinka Blazing. She is known to have paroxysmal atrial fibrillation and is on Eliquis. She has polymyalgia rheumatica and is on methotrexate and prednisone chronically. Recent diagnosis of breast cancer.   During the course of the patient's preoperative work up they have been evaluated comprehensively by a multidisciplinary team of specialists coordinated through the Multidisciplinary Heart Valve Clinic in the  [facility].  They have been demonstrated to suffer from symptomatic severe aortic stenosis as noted above. The patient has been counseled extensively as to the relative risks and benefits of all options for the treatment of severe aortic stenosis including long term medical therapy, conventional surgery for aortic valve replacement, and transcatheter aortic valve replacement.  The patient has been independently evaluated by Dr. Laneta Simmers with CT surgery and they are felt to be at high risk for conventional surgical aortic valve replacement. The surgeon indicated the patient would be a poor candidate for conventional surgery. Based upon review of all of the patient's preoperative diagnostic tests they are felt to be candidate for transcatheter aortic valve replacement using the transfemoral approach as an alternative to high risk conventional surgery.    Following the decision to proceed with transcatheter aortic valve replacement, a discussion has been held regarding what types of management strategies would be attempted intraoperatively in the event of life-threatening complications, including whether or not the patient would be considered a candidate for the use of cardiopulmonary bypass and/or conversion to open sternotomy for attempted surgical intervention.  The patient has been advised of a variety of complications that might develop peculiar to this approach including but not limited to risks of death, stroke, paravalvular leak, aortic dissection or other major vascular complications, aortic annulus rupture, device embolization, cardiac rupture or perforation, acute myocardial infarction, arrhythmia, heart block or bradycardia requiring permanent pacemaker placement, congestive heart failure, respiratory failure, renal failure, pneumonia, infection, other late complications related to structural valve deterioration or migration, or other complications that might ultimately cause a  temporary or permanent loss of functional independence or other long term morbidity.  The patient provides full informed consent for the procedure as described and all questions were answered preoperatively.  DETAILS OF THE OPERATIVE PROCEDURE  PREPARATION:   The patient is brought to the operating room on the above mentioned date and central monitoring was established by the anesthesia team including placement of a radial arterial line. The patient is placed in the supine position on the operating table.  Intravenous antibiotics are administered. Conscious sedation is used.   Baseline transthoracic echocardiogram was performed. The patient's chest, abdomen, both groins, and both lower extremities are prepared and draped in a sterile manner. A time out procedure is performed.   PERIPHERAL ACCESS:   Using the modified Seldinger technique, femoral arterial and venous access were obtained with placement of a 6 Fr sheath in the artery and a 7 Fr sheath in the vein on the right side using u/s guidance.  A pigtail diagnostic catheter was passed through the femoral arterial sheath under fluoroscopic guidance into the aortic root.  A temporary transvenous pacemaker catheter was passed through the femoral venous sheath under fluoroscopic guidance into the right ventricle.  The pacemaker was tested to ensure stable lead placement and pacemaker capture. Aortic root angiography was performed in order to determine the optimal angiographic angle for valve deployment.  TRANSFEMORAL ACCESS:  A micropuncture kit was used to gain access to the left femoral artery using u/s guidance. Position confirmed with angiography. Pre-closure with double ProGlide closure devices. The patient was heparinized systemically and ACT verified > 250 seconds.    A 14 Fr transfemoral E-sheath was introduced into the left femoral artery after progressively dilating over an Amplatz superstiff wire. An AL-1 catheter was used to direct a  straight-tip exchange length wire across the native aortic valve into the left ventricle. This was exchanged out for a pigtail catheter and position was confirmed in the LV apex. Simultaneous LV and Ao pressures were recorded.  The pigtail catheter was then exchanged for a Safari  wire in the LV apex.   TRANSCATHETER HEART VALVE DEPLOYMENT:  An Edwards Sapien 3 THV (size 23 mm) was prepared and crimped per manufacturer's guidelines, and the proper orientation of the valve is confirmed on the [employer] delivery system. The valve was advanced through the introducer sheath using normal technique until in an appropriate position in the abdominal aorta beyond the sheath tip. The balloon was then retracted and using the fine-tuning wheel was centered on the valve. The valve was then advanced across the aortic arch using appropriate flexion of the catheter. The valve was carefully positioned across the aortic valve annulus. The Commander catheter was retracted using normal technique. Once final position of the valve has been confirmed by angiographic assessment, the valve is deployed while temporarily holding ventilation and during rapid ventricular pacing to maintain systolic blood pressure < 50 mmHg and pulse pressure < 10 mmHg. The balloon inflation is held for >3 seconds after reaching full deployment volume. Once the balloon has fully deflated the balloon is retracted into the ascending aorta and valve function is assessed using TTE. There is felt to be no paravalvular leak and no central aortic insufficiency.  The patient's hemodynamic recovery following valve deployment is good.  The deployment balloon and guidewire are both removed. Echo demostrated acceptable post-procedural gradients, stable mitral valve function, and no AI.   PROCEDURE COMPLETION:  The sheath was then removed and closure devices were completed. Protamine was administered once femoral arterial repair was complete. The temporary  pacemaker, pigtail catheters and femoral sheaths were removed with a Mynx closure device placed in the artery and manual pressure used  for venous hemostasis.    The patient tolerated the procedure well and is transported to the surgical intensive care in stable condition. There were no immediate intraoperative complications. All sponge instrument and needle counts are verified correct at completion of the operation.   No blood products were administered during the operation.  The patient received a total of 40 mL of intravenous contrast during the procedure.  Verne Carrow MD 08/19/2021 9:32 AM

## 2021-08-19 NOTE — Transfer of Care (Signed)
Immediate Anesthesia Transfer of Care Note ? ?Patient: Marissa Powers ? ?Procedure(s) Performed: Transcatheter Aortic Valve Replacement, Transfemoral Using 58m Sapien 3 Ultra Edwards Valve (Chest) ?INTRAOPERATIVE TRANSTHORACIC ECHOCARDIOGRAM ? ?Patient Location: Cath Lab ? ?Anesthesia Type:MAC ? ?Level of Consciousness: awake and drowsy ? ?Airway & Oxygen Therapy: Patient Spontanous Breathing and Patient connected to nasal cannula oxygen ? ?Post-op Assessment: Report given to RN and Post -op Vital signs reviewed and stable ? ?Post vital signs: Reviewed and stable ? ?Last Vitals:  ?Vitals Value Taken Time  ?BP 123/45 08/19/21 0936  ?Temp    ?Pulse 51 08/19/21 0936  ?Resp 19 08/19/21 0936  ?SpO2 94 % 08/19/21 0936  ?Vitals shown include unvalidated device data. ? ?Last Pain:  ?Vitals:  ? 08/19/21 0642  ?TempSrc:   ?PainSc: 0-No pain  ?   ? ?  ? ?Complications: No notable events documented. ?

## 2021-08-19 NOTE — Interval H&P Note (Signed)
History and Physical Interval Note: ? ?08/19/2021 ?6:34 AM ? ?SENNIE BORDEN  has presented today for surgery, with the diagnosis of Severe Aortic Stenosis.  The various methods of treatment have been discussed with the patient and family. After consideration of risks, benefits and other options for treatment, the patient has consented to  Procedure(s): ?Transcatheter Aortic Valve Replacement, Transfemoral (N/A) ?INTRAOPERATIVE TRANSTHORACIC ECHOCARDIOGRAM (N/A) as a surgical intervention.  The patient's history has been reviewed, patient examined, no change in status, stable for surgery.  I have reviewed the patient's chart and labs.  Questions were answered to the patient's satisfaction.   ? ? ?Gaye Pollack ? ? ?

## 2021-08-19 NOTE — Progress Notes (Signed)
Pt has developed low grade fevers ?Pt received Tylenol '@3'$ :30 and IV Antibiotic '@1'$ :21 prior to fevers ?100.2 '@5pm'$  ?100.8 '@6pm'$  ? ?Pt's room was cooled, pt moved off bed into chair cooled with a damp wet cloth and changed into cool gown ? ?Pt temp was 99.6 '@6'$ :26pm ? ?At shift change pt temperature was 100.9 again ?Pt used incentive spirometer and temp dropped down to 99.4 at 7:35pm ?

## 2021-08-20 ENCOUNTER — Encounter (HOSPITAL_COMMUNITY): Payer: Self-pay | Admitting: Cardiovascular Disease

## 2021-08-20 ENCOUNTER — Inpatient Hospital Stay (HOSPITAL_COMMUNITY): Payer: Medicare Other

## 2021-08-20 ENCOUNTER — Inpatient Hospital Stay (HOSPITAL_BASED_OUTPATIENT_CLINIC_OR_DEPARTMENT_OTHER)
Admission: RE | Admit: 2021-08-20 | Discharge: 2021-08-20 | Disposition: A | Discharge status: Home / Self Care | Payer: Medicare Other | Source: Ambulatory Visit | Attending: Physician Assistant | Admitting: Physician Assistant

## 2021-08-20 DIAGNOSIS — Z952 Presence of prosthetic heart valve: Secondary | ICD-10-CM | POA: Diagnosis not present

## 2021-08-20 DIAGNOSIS — I447 Left bundle-branch block, unspecified: Secondary | ICD-10-CM

## 2021-08-20 DIAGNOSIS — Z8616 Personal history of COVID-19: Secondary | ICD-10-CM | POA: Diagnosis not present

## 2021-08-20 DIAGNOSIS — I272 Pulmonary hypertension, unspecified: Secondary | ICD-10-CM | POA: Diagnosis not present

## 2021-08-20 DIAGNOSIS — I350 Nonrheumatic aortic (valve) stenosis: Principal | ICD-10-CM

## 2021-08-20 DIAGNOSIS — Z006 Encounter for examination for normal comparison and control in clinical research program: Secondary | ICD-10-CM | POA: Diagnosis not present

## 2021-08-20 LAB — BASIC METABOLIC PANEL
Anion gap: 4 — ABNORMAL LOW (ref 5–15)
BUN: 6 mg/dL — ABNORMAL LOW (ref 8–23)
CO2: 23 mmol/L (ref 22–32)
Calcium: 8.4 mg/dL — ABNORMAL LOW (ref 8.9–10.3)
Chloride: 112 mmol/L — ABNORMAL HIGH (ref 98–111)
Creatinine, Ser: 0.88 mg/dL (ref 0.44–1.00)
GFR, Estimated: >60 mL/min (ref >60)
Glucose, Bld: 120 mg/dL — ABNORMAL HIGH (ref 70–99)
Potassium: 3.8 mmol/L (ref 3.5–5.1)
Sodium: 139 mmol/L (ref 135–145)

## 2021-08-20 LAB — CBC
HCT: 28.7 % — ABNORMAL LOW (ref 36.0–46.0)
Hemoglobin: 9.3 g/dL — ABNORMAL LOW (ref 12.0–15.0)
MCH: 35.5 pg — ABNORMAL HIGH (ref 26.0–34.0)
MCHC: 32.4 g/dL (ref 30.0–36.0)
MCV: 109.5 fL — ABNORMAL HIGH (ref 80.0–100.0)
Platelets: 149 10*3/uL — ABNORMAL LOW (ref 150–400)
RBC: 2.62 MIL/uL — ABNORMAL LOW (ref 3.87–5.11)
RDW: 14.2 % (ref 11.5–15.5)
WBC: 7.9 10*3/uL (ref 4.0–10.5)
nRBC: 0 % (ref 0.0–0.2)

## 2021-08-20 LAB — ECHOCARDIOGRAM COMPLETE
AV Mean grad: 17 mmHg
AV Peak grad: 33.1 mmHg
Ao pk vel: 2.88 m/s
Area-P 1/2: 4.06 cm2
Height: 65 in
S' Lateral: 2.7 cm
Weight: 2905.6 oz

## 2021-08-20 LAB — MAGNESIUM: Magnesium: 2 mg/dL (ref 1.7–2.4)

## 2021-08-20 MED FILL — Magnesium Sulfate Inj 50%: INTRAMUSCULAR | Qty: 10 | Status: AC

## 2021-08-20 MED FILL — Potassium Chloride Inj 2 mEq/ML: INTRAVENOUS | Qty: 40 | Status: AC

## 2021-08-20 MED FILL — Heparin Sodium (Porcine) Inj 1000 Unit/ML: Qty: 1000 | Status: AC

## 2021-08-20 NOTE — TOC Transition Note (Signed)
Transition of Care (TOC) - CM/SW Discharge Note ?[name] Therapist, sports, BSN ?Transitions of Care ?Unit 4E- RN Case Manager ?See Treatment Team for direct phone #  ? ? ?Patient Details  ?Name: Marissa Powers ?MRN: [id number] ?Date of Birth: February 08, 1941 ? ?Transition of Care (TOC) CM/SW Contact:  ?Dahlia Client, Romeo Rabon, RN ?Phone Number: ?08/20/2021, 1:30 PM ? ? ?Clinical Narrative:    ?Pt stable for transition home today s/p TAVR.  Transition of Care Department [facility]) has reviewed patient and no TOC needs have been identified at this time. Family to transport home.  ? ?Final next level of care: Home/Self Care ?Barriers to Discharge: No Barriers Identified ? ? ?Patient Goals and CMS Choice ?  ?  ?Choice offered to / list presented to : NA ? ?Discharge Placement ?  ?           ?  ? Home ?  ?  ? ?Discharge Plan and Services ?  ?Discharge Planning Services: NA ?Post Acute Care Choice: NA          ?DME Arranged: N/A ?DME Agency: NA ?  ?  ?  ?HH Arranged: NA ?[employer] Agency: NA ?  ?  ?  ? ?Social Determinants of Health (SDOH) Interventions ?  ? ? ?Readmission Risk Interventions ? ?  08/20/2021  ?  1:29 PM  ?Readmission Risk Prevention Plan  ?Post Dischage Appt Complete  ?Medication Screening Complete  ?Transportation Screening Complete  ? ? ? ? ? ?

## 2021-08-20 NOTE — Progress Notes (Signed)
Zio at applied in hospital . ? ?Dr. Tamala Julian to read. ?

## 2021-08-20 NOTE — Discharge Summary (Addendum)
?[facility]   ?MULTIDISCIPLINARY HEART VALVE TEAM ? ?Discharge Summary  ?  ?Patient ID: Marissa Powers ?MRN: [id number]; DOB: 1941/03/25 ? ?Admit date: 08/19/2021 ?Discharge date: 08/20/2021 ? ?Primary Care Provider: Crist Infante, MD  ?Primary Cardiologist: Sinclair Grooms, MD / Dr. Angelena Form & Dr. Cyndia Bent (TAVR) ? ?Discharge Diagnoses  ?  ?Principal Problem: ?  S/P TAVR (transcatheter aortic valve replacement) ?Active Problems: ?  Hypercholesteremia ?  Hypertension ?  GERD (gastroesophageal reflux disease) ?  Lichen sclerosus ?  Paroxysmal atrial fibrillation (HCC) ?  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  Severe aortic stenosis ?  Malignant neoplasm of lower-outer quadrant of left breast of female, estrogen receptor positive ([place]) ?  Polymyalgia rheumatica ([place]) ? ? ?Allergies ?Allergies  ?Allergen Reactions  ? Cashew Nut Oil Anaphylaxis, Swelling and Other (See Comments)  ?  Tingling/facial swelling ?  ? ? ?Diagnostic Studies/Procedures  ?  ?TAVR OPERATIVE NOTE ? ?  ?Date of Procedure:                08/19/2021 ?  ?Preoperative Diagnosis:      Severe Aortic Stenosis  ?  ?Postoperative Diagnosis:    Same  ?  ?Procedure:      ?  ?Transcatheter Aortic Valve Replacement - Transfemoral Approach ?            Edwards Sapien 3 THV (size 23 mm, model # [id number] serial # [id number]) ?             ?Co-Surgeons:                        Lauree Chandler, MD and Gaye Pollack, MD ?  ?Anesthesiologist:                  Stoltzfus ?  ?Echocardiographer:              Gasper Sells ?  ?Pre-operative Echo Findings: ?Severe aortic stenosis ?Normal left ventricular systolic function ?  ?Post-operative Echo Findings: ?No paravalvular leak ?Normal left ventricular systolic function ?_____________ ?  ? ?Echo 08/20/21: completed but pending formal read at the time of discharge  ? ?History of Present Illness   ?  ?Marissa Powers is a 81 y.o. female with a history of of mild  CAD, arthritis, polymyalgia rheumatica (Methotrexate and Prednisone), paroxysmal atrial fibrillation on amio and Eliquis, GERD, HLD, HTN, hypothyroidism, recent diagnosis of breast cancer, and severe aortic stenosis who presented to [facility] on 08[facility]21 for planned TAVR.  ? ?She has been followed by Dr. Tamala Julian for moderate aortic stenosis. She recently has had worsening shortness of breath and fatigue with exertion. Repeat 2D echo on 06/09/2021 showed EF 60-65%, G2DD, and severe AS with a calcified aortic valve with a mean gradient of 40 mmHg, peak gradient of 63 mmHg and AVA of 0.85 cm?. L/RHC on 06/19/21 showed severe aortic stenosis with mean transvalvular gradient 50.3 mmHg, and calculated aortic valve area 0.89 cm? as well as widely patent left main, LAD, circumflex, and 70 to 80% stenosis of the mid RCA. Medical therapy was recommended.  ? ?She was admitted 3/31-07/13/21 for Covid 19 c/b afib with RVR and hypotension. She was treated with molnupiravir and Decadron. She was treated with IV amio and discharged on amiodarone load.  Plan was to keep amio on until she got through TAVR with a plan to stop a couple weeks after TAVR.  ?  ?  She was recently diagnosed with left breast cancer found on routine screening.  She subsequently had an ultrasound showing multiple nodules in the left breast and an MRI showing 8 cm of multifocal nodular disease in the left breast.  She underwent a biopsy that showed invasive lobular cancer and had 1 large lymph node which on biopsy was positive for breast cancer.  She has been seen by Dr. Sonny Dandy and it is felt that she will require mastectomy and radiation therapy.  She was started on neoadjuvant antiestrogen therapy until surgery. ? ?The patient has been evaluated by the multidisciplinary valve team and felt to have severe, symptomatic aortic stenosis and to be a suitable candidate for TAVR, which was set up for 08/19/21.  ? ?Hospital Course  ?   ?Consultants: none.   ? ?Severe AS: s/p  successful TAVR with a 23 mm Edwards Sapien 3 Ultra Resilia THV via the TF approach on 08/19/21. Post operative echo completed but pending formal read. Groin sites are stable.  Post op ECG showed new 1st deg AV block and LBBB. 1st degree AV block now resolved. There has been no evidence of high grade heart block. She will resume her home Eliquis. Plan for DC home today with close outpatient follow up in the office next week. ? ?New persistent LBBB: will place a Zio at rule out delayed HAVB after TAVR.  ? ?Post op fever: she had some rigors after surgery followed by fevers with a Tmax of 101.3. She has been treated with tylenol and incentive spirometry. CXR was normal. White count normal. No UTI s/s and recent UA at PAT unremarkable. Will continue to monitor at home.  ? ?PAF: she has been maintaining sinus rhythm during this admission. Plan to keep her on amio for a couple more weeks to get her through TAVR and then it will be discontinued.  ? ?CAD: pre TAVR cath 06/19/21 showed widely patent left main, LAD, circumflex, and 70 to 80% stenosis of the mid RCA. Medical therapy was recommended.  ? ?HTN: BP well controlled. No changes made.  ? ?Breast CA: continue letrozole. She has been seen by Dr. Sonny Dandy and it is felt that she will require mastectomy and radiation therapy.  ? ?_____________ ? ?Discharge Vitals ?Blood pressure 121/61, pulse 72, temperature 98.8 ?F (37.1 ?C), resp. rate 18, height '5\' 5"'$  (1.651 m), weight 82.4 kg, SpO2 97 %.  ?Filed Weights  ? 08/19/21 0551 08/20/21 0507  ?Weight: 81.6 kg 82.4 kg  ? ? ?GEN: Well nourished, well developed, in no acute distress ?HEENT: normal ?Neck: no JVD or masses ?Cardiac: RRR; no murmurs, rubs, or gallops,no edema  ?Respiratory:  clear to auscultation bilaterally, normal work of breathing ?GI: soft, nontender, nondistended, + BS ?MS: no deformity or atrophy ?Skin: warm and dry, no rash.  Groin sites clear without hematoma or ecchymosis  ?Neuro:  Alert and Oriented x 3,  Strength and sensation are intact ?Psych: euthymic mood, full affect ? ? ?Labs & Radiologic Studies  ?  ?CBC ?Recent Labs  ?  08/19/21 ?1018 08/20/21 ?0159  ?WBC  --  7.9  ?HGB 10.2* 9.3*  ?HCT 30.0* 28.7*  ?MCV  --  109.5*  ?PLT  --  149*  ? ?Basic Metabolic Panel ?Recent Labs  ?  08/19/21 ?1018 08/20/21 ?0159  ?NA 144 139  ?K 4.0 3.8  ?CL 110 112*  ?CO2  --  23  ?GLUCOSE 132* 120*  ?BUN 8 6*  ?CREATININE 0.70 0.88  ?CALCIUM  --  8.4*  ?MG  --  2.0  ? ?Liver Function Tests ?No results for input(s): AST, ALT, ALKPHOS, BILITOT, PROT, ALBUMIN in the last 72 hours. ?No results for input(s): LIPASE, AMYLASE in the last 72 hours. ?Cardiac Enzymes ?No results for input(s): CKTOTAL, CKMB, CKMBINDEX, TROPONINI in the last 72 hours. ?BNP ?Invalid input(s): POCBNP ?D-Dimer ?No results for input(s): DDIMER in the last 72 hours. ?Hemoglobin A1C ?No results for input(s): HGBA1C in the last 72 hours. ?Fasting Lipid Panel ?No results for input(s): CHOL, HDL, LDLCALC, TRIG, CHOLHDL, LDLDIRECT in the last 72 hours. ?Thyroid Function Tests ?No results for input(s): TSH, T4TOTAL, T3FREE, THYROIDAB in the last 72 hours. ? ?Invalid input(s): FREET3 ?_____________  ?DG Chest 2 View ? ?Result Date: 08/15/2021 ?CLINICAL DATA:  Preoperative exam. EXAM: CHEST - 2 VIEW COMPARISON:  Chest radiograph 07/11/2021 FINDINGS: Stable cardiac and mediastinal contours. Mild tortuosity thoracic aorta. No large area pulmonary consolidation. No pleural effusion or pneumothorax. Thoracic spine degenerative changes. IMPRESSION: No active cardiopulmonary disease. Electronically Signed   By: Lovey Newcomer M.D.   On: 08/15/2021 16:40  ? ?MR BREAST BILATERAL W WO CONTRAST INC CAD ? ?Result Date: 07/23/2021 ?CLINICAL DATA:  Recent diagnosis of left breast invasive carcinoma, lesion biopsied at 3 o'clock, 4 cm the nipple, and a metastatic left axillary lymph node. Biopsy performed on 07/18/2021. Current exam for extent of disease. EXAM: BILATERAL BREAST MRI WITH AND  WITHOUT CONTRAST TECHNIQUE: Multiplanar, multisequence MR images of both breasts were obtained prior to and following the intravenous administration of 9 ml of Gadavist Three-dimensional MR images were rendered by

## 2021-08-20 NOTE — Progress Notes (Addendum)
?HEART AND VASCULAR CENTER   ?MULTIDISCIPLINARY HEART VALVE TEAM ? ?Patient Name: Marissa Powers ?Date of Encounter: 08/20/2021 ? ?Admit date: 08/19/2021 ? ?Primary Care Provider: Crist Infante, MD ?[facility] Cardiologist: Sinclair Grooms, MD  ?[place] Electrophysiologist:  Thompson Grayer, MD  ? ?Hospital Problem List  ?   ?Principal Problem: ?  S/P TAVR (transcatheter aortic valve replacement) ?Active Problems: ?  Hypercholesteremia ?  Hypertension ?  GERD (gastroesophageal reflux disease) ?  Lichen sclerosus ?  Paroxysmal atrial fibrillation (HCC) ?  Coronary artery disease of native artery of native heart with stable angina pectoris ([place]) ?  Severe aortic stenosis ?  Malignant neoplasm of lower-outer quadrant of left breast of female, estrogen receptor positive ([place]) ?  Polymyalgia rheumatica ([place]) ?  ? ? ?Subjective  ? ?Just feels achy from fevers. Has a little cough but no other symptoms.  ? ?Inpatient Medications  ?  ?Scheduled Meds: ? amiodarone  200 mg Oral QPM  ? letrozole  2.5 mg Oral QPM  ? levothyroxine  88 mcg Oral QAC breakfast  ? predniSONE  1 mg Oral Q breakfast  ? sodium chloride flush  3 mL Intravenous Q12H  ? zolpidem  2.5 mg Oral QHS  ? ?Continuous Infusions: ? sodium chloride    ? nitroGLYCERIN    ? norepinephrine (LEVOPHED) '4mg'$  / 216m infusion 1 mcg/min (08/19/21 1104)  ? ?PRN Meds: ?sodium chloride, acetaminophen **OR** acetaminophen, morphine injection, ondansetron (ZOFRAN) IV, oxyCODONE, sodium chloride flush, traMADol  ? ?Vital Signs  ?  ?Vitals:  ? 08/20/21 0507 08/20/21 0715 08/20/21 0627005/10/23 0947  ?BP:   121/61   ?Pulse:   72   ?Resp:  (!) '22 13 18  '$ ?Temp:   (!) 101.3 ?F (38.5 ?C) 98.8 ?F (37.1 ?C)  ?TempSrc:   Oral   ?SpO2:   97%   ?Weight: 82.4 kg     ?Height:      ? ? ?Intake/Output Summary (Last 24 hours) at 08/20/2021 1044 ?Last data filed at 08/19/2021 1610 ?Gross per 24 hour  ?Intake 475.91 ml  ?Output --  ?Net 475.91 ml  ? ?Filed Weights  ? 08/19/21 0551 08/20/21 0507   ?Weight: 81.6 kg 82.4 kg  ? ? ?Physical Exam  ?  ?GEN: Well nourished, well developed, in no acute distress.  ?HEENT: Grossly normal.  ?Neck: Supple, no JVD, carotid bruits, or masses. ?Cardiac: RRR, no murmurs, rubs, or gallops. No clubbing, cyanosis, edema.   ?Respiratory:  Respirations regular and unlabored, clear to auscultation bilaterally. ?GI: Soft, nontender, nondistended, BS + x 4. ?MS: no deformity or atrophy. ?Skin: warm and dry, no rash.  Groin sites clear without hematoma or ecchymosis  ?Neuro:  Strength and sensation are intact. ?Psych: AAOx3.  Normal affect. ? ?Labs  ?  ?CBC ?Recent Labs  ?  08/19/21 ?1018 08/20/21 ?0159  ?WBC  --  7.9  ?HGB 10.2* 9.3*  ?HCT 30.0* 28.7*  ?MCV  --  109.5*  ?PLT  --  149*  ? ?Basic Metabolic Panel ?Recent Labs  ?  08/19/21 ?1018 08/20/21 ?0159  ?NA 144 139  ?K 4.0 3.8  ?CL 110 112*  ?CO2  --  23  ?GLUCOSE 132* 120*  ?BUN 8 6*  ?CREATININE 0.70 0.88  ?CALCIUM  --  8.4*  ?MG  --  2.0  ? ?Liver Function Tests ?No results for input(s): AST, ALT, ALKPHOS, BILITOT, PROT, ALBUMIN in the last 72 hours. ?No results for input(s): LIPASE, AMYLASE in the last  72 hours. ?Cardiac Enzymes ?No results for input(s): CKTOTAL, CKMB, CKMBINDEX, TROPONINI in the last 72 hours. ?BNP ?Invalid input(s): POCBNP ?D-Dimer ?No results for input(s): DDIMER in the last 72 hours. ?Hemoglobin A1C ?No results for input(s): HGBA1C in the last 72 hours. ?Fasting Lipid Panel ?No results for input(s): CHOL, HDL, LDLCALC, TRIG, CHOLHDL, LDLDIRECT in the last 72 hours. ?Thyroid Function Tests ?No results for input(s): TSH, T4TOTAL, T3FREE, THYROIDAB in the last 72 hours. ? ?Invalid input(s): FREET3 ? ?Telemetry  ?  ?Sinus - Personally Reviewed ? ?ECG  ?  ?Sinus with new LBBB. HR 70 - Personally Reviewed ? ?Radiology  ?  ?ECHOCARDIOGRAM LIMITED ? ?Result Date: 08/19/2021 ?   ECHOCARDIOGRAM LIMITED REPORT   Patient Name:   Marissa Powers Date of Exam: 08/19/2021 Medical Rec #:  [id number]      Height:       65.0  in Accession #:    [id number]     Weight:       180.0 lb Date of Birth:  [birth year]      BSA:          1.892 m? Patient Age:    21 years       BP:           125/70 mmHg Patient Gender: F              HR:           51 bpm. Exam Location:  Inpatient Procedure: Limited Echo, Color Doppler and Cardiac Doppler Indications:     Aortic Stenosis i35.0  History:         Patient has prior history of Echocardiogram examinations, most                  recent 06/09/2021. CAD, Arrythmias:Atrial Fibrillation; Risk                  Factors:Hypertension and Dyslipidemia.  Sonographer:     Raquel Sarna Senior RDCS Referring Phys:  [place] Phys: Rudean Haskell MD  Sonographer Comments: 34m Edwards S3U TAVR Implanted IMPRESSIONS  1. Interventional TTE for TAVR Placement.  2. Prior to procedure, severe aortic stenosis. Mean gradient 29 mm Hg, peak gradient 49 mm Hg, AVA 0.71 cm2 by VTI. DVI 0.21. No aortic regurgitation.  3. Post procedure, the aortic valve has been replaced by a 23 mm Edwards Sapien Valve. Aortic valve regurgitation is not visualized (neither central nor PVL). Mean gradient of 3 mm Hg, Peak gradient of 6 mm Hg. DVI 0.72. EOA by VTI 2.29 cm2.  4. The pericardial effusion is localized near the right ventricle, trivial, and unchange through procedure.  5. Left ventricular ejection fraction, by estimation, is 60 to 65%. The left ventricle has normal function.  6. Right ventricular systolic function is normal. The right ventricular size is normal.  7. The mitral valve is grossly normal. Trivial mitral valve regurgitation. No evidence of mitral stenosis. Comparison(s): Successful TAVR placement. FINDINGS  Left Ventricle: Left ventricular ejection fraction, by estimation, is 60 to 65%. The left ventricle has normal function. Right Ventricle: The right ventricular size is normal. Right ventricular systolic function is normal. Pericardium: Trivial pericardial effusion is present. The pericardial  effusion is localized near the right ventricle. Mitral Valve: The mitral valve is grossly normal. Trivial mitral valve regurgitation. No evidence of mitral valve stenosis. Aortic Valve: The aortic valve has been repaired/replaced. Aortic valve regurgitation is not visualized. Aortic valve mean gradient measures 4.0  mmHg. Aortic valve peak gradient measures 8.4 mmHg. Aortic valve area, by VTI measures 2.29 cm?. IAS/Shunts: No atrial level shunt detected by color flow Doppler. LEFT VENTRICLE PLAX 2D LVOT diam:     2.00 cm LV SV:         82 LV SV Index:   44 LVOT Area:     3.14 cm?  AORTIC VALVE AV Area (Vmax):    2.58 cm? AV Area (Vmean):   2.44 cm? AV Area (VTI):     2.29 cm? AV Vmax:           145.00 cm/s AV Vmean:          95.000 cm/s AV VTI:            0.360 m AV Peak Grad:      8.4 mmHg AV Mean Grad:      4.0 mmHg LVOT Vmax:         119.00 cm/s LVOT Vmean:        73.700 cm/s LVOT VTI:          0.262 m LVOT/AV VTI ratio: 0.73  SHUNTS Systemic VTI:  0.26 m Systemic Diam: 2.00 cm Rudean Haskell MD Electronically signed by Rudean Haskell MD Signature Date/Time: 08/19/2021/2:54:25 PM    Final   ? ?Structural Heart Procedure ? ?Result Date: 08/19/2021 ?See surgical note for result. ? ?HYBRID OR IMAGING ([facility] ONLY) ? ?Result Date: 08/19/2021 ?There is no interpretation for this exam.  This order is for images obtained during a surgical procedure.  Please See "Surgeries" Tab for more information regarding the procedure.   ? ?Cardiac Studies  ? ?TAVR OPERATIVE NOTE ?  ?  ?Date of Procedure:                08/19/2021 ?  ?Preoperative Diagnosis:      Severe Aortic Stenosis  ?  ?Postoperative Diagnosis:    Same  ?  ?Procedure:      ?  ?Transcatheter Aortic Valve Replacement - Transfemoral Approach ?            Edwards Sapien 3 THV (size 23 mm, model # T562222 serial # [id number]) ?             ?Co-Surgeons:                        Lauree Chandler, MD and Gaye Pollack, MD ?  ?Anesthesiologist:                   [place] ?  ?Echocardiographer:              Gasper Sells ?  ?Pre-operative Echo Findings: ?Severe aortic stenosis ?Normal left ventricular systolic function ?  ?Post-operative Echo Findings: ?No paravalvula

## 2021-08-20 NOTE — Progress Notes (Signed)
CARDIAC REHAB PHASE I  ? ?PRE:  Rate/Rhythm: 76 SR ? ?  BP: sitting 97/45 ? ?  SaO2: 92 RA ? ?MODE:  Ambulation: 400 ft  ? ?POST:  Rate/Rhythm: 96 SR ? ?  BP: sitting 122/59  ? ?  SaO2: 92 RA ? ?Tolerated well. Sts her SOB is much improved. SaO2 92 RA during ambulation. Has been practicing IS, 1750 ml. Encouraged walking at home, groin restrictions. She thought about CRPII however she will walk at the beach this summer and also does water aerobics. ?[id number]  ? ?Yves Dill CES, ACSM ?08/20/2021 ?10:10 AM ? ? ? ? ?

## 2021-08-21 ENCOUNTER — Telehealth: Payer: Self-pay

## 2021-08-21 DIAGNOSIS — I447 Left bundle-branch block, unspecified: Secondary | ICD-10-CM | POA: Diagnosis not present

## 2021-08-21 DIAGNOSIS — Z952 Presence of prosthetic heart valve: Secondary | ICD-10-CM | POA: Diagnosis not present

## 2021-08-21 NOTE — Telephone Encounter (Signed)
Patient contacted regarding discharge from [facility] on 08/20/2021. ? ?Patient understands to follow up with provider Structural Heart APP on 08/27/2021 at 2:30 PM at [facility] office. ?Patient understands discharge instructions? yes ?Patient understands medications and regiment? yes ?Patient understands to bring all medications to this visit? Yes ? ?The pt is feeling well this morning. Yesterday when she got home from the hospital her hands were shaking and she had chills. Temp was 100.2 and the pt took Tyleonol.  Last night at 9:30 PM her temp was 98.6 and the pt slept well.  This morning her temp is 99.4 and she denies symptoms.  The pt has taken 2 tylenol.  I advised the pt to continue to monitor her temperature and contact the office is she  has any new symptoms or concerns.  I will contact the pt again on Friday to see how she is feeling. Pt agreed with plan.  ? ? ?

## 2021-08-22 NOTE — Telephone Encounter (Signed)
Spoke to patient today and she has had complete resolution of fevers.  ? ?Angelena Form PA-C  [facility]   ?

## 2021-08-25 ENCOUNTER — Encounter: Payer: Self-pay | Admitting: *Deleted

## 2021-08-25 NOTE — Progress Notes (Signed)
?HEART AND VASCULAR CENTER   ?[place] ?                                    ?Cardiology Office Note:   ? ?Date:  08/27/2021  ? ?ID:  Marissa Powers, DOB 05-Apr-1941, MRN [id number] ? ?PCP:  Crist Infante, MD  ?[place] Cardiologist:  Sinclair Grooms, MD  / Dr. Angelena Form & Dr. Cyndia Bent (TAVR) ?[place] [facility] Electrophysiologist:   Grayer, MD  ? ?Referring MD: Crist Infante, MD  ? ?[facility] s/p TAVR ? ?History of Present Illness:   ? ?Marissa Powers is a 81 y.o. female with a hx of mild CAD, arthritis, polymyalgia rheumatica (Methotrexate and Prednisone), paroxysmal atrial fibrillation on amio and Eliquis, GERD, HLD, HTN, hypothyroidism, recent diagnosis of breast cancer, and severe aortic stenosis s/p TAVR (08/19/21) who presents to clinic for follow up.  ? ?She has been followed by Dr. Tamala Julian for moderate aortic stenosis. She recently has had worsening shortness of breath and fatigue with exertion. Repeat 2D echo on 06/09/2021 showed EF 60-65%, G2DD, and severe AS with a calcified aortic valve with a mean gradient of 40 mmHg, peak gradient of 63 mmHg and AVA of 0.85 cm?. L/RHC on 06/19/21 showed severe aortic stenosis with mean transvalvular gradient 50.3 mmHg, and calculated aortic valve area 0.89 cm? as well as widely patent left main, LAD, circumflex, and 70 to 80% stenosis of the mid RCA. Medical therapy was recommended.  ?  ?She was admitted 3/31-07/13/21 for Covid 19 c/b afib with RVR and hypotension. She was treated with molnupiravir and Decadron. She was treated with IV amio and discharged on amiodarone load.  Plan was to keep amio on until she got through TAVR with a plan to stop a couple weeks after TAVR.  ?  ?She was recently diagnosed with left breast cancer found on routine screening.  She subsequently had an ultrasound showing multiple nodules in the left breast and an MRI showing 8 cm of multifocal nodular disease in the left breast.  She underwent a biopsy that showed invasive  lobular cancer and had 1 large lymph node which on biopsy was positive for breast cancer.  She has been seen by Dr. Sonny Dandy and it is felt that she will require mastectomy and radiation therapy.  She was started on neoadjuvant antiestrogen therapy until surgery. ?  ?She was evaluated by the multidisciplinary valve team and underwent successful TAVR with a 23 mm Edwards Sapien 3 Ultra Resilia THV via the TF approach on 08/19/21. Post operative echo showed EF 60%, normally functioning TAVR with a mean gradient of 17 mmHg and no PVL.There was some degree of PPM. She was discharged on home Eliquis '5mg'$  BID. She did have some issues with fever but no localizing sx. She had a new LBBB and so a Zio At was placed at discharge. ? ?Today the patient presents to clinic for follow up. Here with her friend, Jana Half, who is a retired PA. She is feeling fatigued and has some dizziness. A little disappointed she is not feeling better after TAVR. No CP or SOB. Breathing has improved since TAVR. No LE edema, orthopnea or PND. No syncope. No blood in stool or urine. No palpitations. ? ? ?Past Medical History:  ?Diagnosis Date  ? Arthritis   ? osteoarthritis  ? CAD (coronary artery disease)   ? Dysplasia of cervix, low grade (  CIN 1) 1990  ? HPV, Cryo, Laser  ? Fibroadenoma   ? Left, at 5 o'clock, not excised   ? Fibroid 2002  ? 1 cm, 2 cm  ? GERD (gastroesophageal reflux disease)   ? Hypercholesteremia   ? Hypertension   ? Hyperthyroidism   ? PAF (paroxysmal atrial fibrillation) ([place])   ? on Eliquis  ? PMR (polymyalgia rheumatica) (HCC)   ? 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  ? s/p TAVR with a 23 mm Edwards S3UR via the TF approach by Dr. Angelena Form & Dr. Cyndia Bent .  ? Severe aortic stenosis   ? ? ?Past Surgical History:  ?Procedure Laterality Date  ? BREAST BIOPSY Bilateral 1970's  ? x2 one a side  ? CATARACT EXTRACTION    ? bilateral  ? implantable loop recorder removal  05/30/2021  ? MDT reveal LINQ removed by Dr  Rayann Heman  ? INTRAOPERATIVE TRANSTHORACIC ECHOCARDIOGRAM N/A 08/19/2021  ? Procedure: INTRAOPERATIVE TRANSTHORACIC ECHOCARDIOGRAM;  Surgeon: Burnell Blanks, MD;  Location: [place];  Service: Open Heart Surgery;  Laterality: N/A;  ? LOOP RECORDER INSERTION N/A 08/11/2017  ? Procedure: LOOP RECORDER INSERTION;  Surgeon:  Grayer, MD;  Location: [place] [facility];  Service: Cardiovascular;  Laterality: N/A;  ? RETINAL DETACHMENT SURGERY  2000  ? RIGHT/LEFT HEART CATH AND CORONARY ANGIOGRAPHY N/A 06/19/2021  ? Procedure: RIGHT/LEFT HEART CATH AND CORONARY ANGIOGRAPHY;  Surgeon: Belva Crome, MD;  Location: [place] [facility];  Service: Cardiovascular;  Laterality: N/A;  ? THYROIDECTOMY  1967  ? TONSILLECTOMY    ? TOTAL KNEE ARTHROPLASTY  04/25/2012  ? Procedure: TOTAL KNEE BILATERAL;  Surgeon: Gearlean Alf, MD;  Location: [facility];  Service: Orthopedics;  Laterality: Bilateral;  ? TRANSCATHETER AORTIC VALVE REPLACEMENT, TRANSFEMORAL N/A 08/19/2021  ? Procedure: Transcatheter Aortic Valve Replacement, Transfemoral Using 29m Sapien 3 Ultra Edwards Valve;  Surgeon: MBurnell Blanks MD;  Location: [facility]  Service: Open Heart Surgery;  Laterality: N/A;  ? ? ?Current Medications: ?Current Meds  ?Medication Sig  ? acetaminophen (TYLENOL) 500 MG tablet Take 1,000 mg by mouth every 6 (six) hours as needed (pain.).  ? amoxicillin (AMOXIL) 500 MG capsule Take 2,000 mg by mouth See admin instructions. Take 4 capsules (2000 mg) by mouth 1 hour prior to dental appointments  ? apixaban (ELIQUIS) 5 MG TABS tablet Take 1 tablet (5 mg total) by mouth 2 (two) times daily. Resume Apixaban at 8 PM 06/19/2921  ? B-D TB SYRINGE 1CC/27GX1/2" 27G X 1/2" 1 ML MISC Inject into the skin once a week.  ? Calcium Carb-Cholecalciferol (CALCIUM + D3 PO) Take 1 tablet by mouth in the morning.  ? cholecalciferol (VITAMIN D3) 25 MCG (1000 UNIT) tablet Take 1,000 Units by mouth in the morning.  ? Cyanocobalamin (B-12 PO) Take 1,000 mcg by  mouth in the morning.  ? denosumab (PROLIA) 60 MG/ML SOSY injection Inject 60 mg into the skin every 6 (six) months.  ? folic acid (FOLVITE) 1 MG tablet Take 2 mg by mouth in the morning.  ? letrozole (FEMARA) 2.5 MG tablet Take 1 tablet (2.5 mg total) by mouth daily.  ? levothyroxine (SYNTHROID, LEVOTHROID) 88 MCG tablet Take 88 mcg by mouth daily before breakfast.  ? loperamide (IMODIUM) 2 MG capsule Take 2-4 mg by mouth 4 (four) times daily as needed (upset stomach/diarrhea/loose stools).  ? methotrexate 50 MG/2ML injection Inject 25 mg into the skin every Wednesday.  ? metoprolol succinate (TOPROL XL) 25 MG 24  hr tablet Take 1 tablet (25 mg total) by mouth daily.  ? mometasone (ELOCON) 0.1 % ointment Apply topically to vulvar skin no more than twice weekly  ? nitroGLYCERIN (NITROSTAT) 0.4 MG SL tablet Place 1 tablet (0.4 mg total) under the tongue every 5 (five) minutes as needed for chest pain.  ? nystatin cream (MYCOSTATIN) Apply 1 application. topically 2 (two) times daily. Apply to affected area BID for up to 7 days to skin under breasts  ? pantoprazole (PROTONIX) 40 MG tablet Take 40 mg by mouth 2 (two) times daily as needed.  ? predniSONE (DELTASONE) 1 MG tablet Take 1 mg by mouth daily with breakfast. continuous  ? REPATHA SURECLICK 370 MG/ML SOAJ Inject 140 mg into the skin every 14 (fourteen) days.  ? telmisartan (MICARDIS) 20 MG tablet Take 20 mg by mouth every evening.  ? zolpidem (AMBIEN) 5 MG tablet Take 2.5 mg by mouth at bedtime.  ? [DISCONTINUED] amiodarone (PACERONE) 200 MG tablet Take 1 tablet (200 mg total) by mouth daily.  ?  ? ?Allergies:   Cashew nut oil  ? ?Social History  ? ?Socioeconomic History  ? Marital status: Widowed  ?  Spouse name: Not on file  ? Number of children: 2  ? Years of education: Not on file  ? Highest education level: Not on file  ?Occupational History  ? Not on file  ?Tobacco Use  ? Smoking status: Never  ?  Passive exposure: Never  ? Smokeless tobacco: Never  ?Vaping  Use  ? Vaping Use: Never used  ?Substance and Sexual Activity  ? Alcohol use: Yes  ?  Alcohol/week: 4.0 - 6.0 standard drinks  ?  Types: 4 - 6 Standard drinks or equivalent per week  ? Drug use: No  ? Sexu

## 2021-08-27 ENCOUNTER — Ambulatory Visit (INDEPENDENT_AMBULATORY_CARE_PROVIDER_SITE_OTHER): Payer: Medicare Other | Admitting: Physician Assistant

## 2021-08-27 VITALS — BP 126/80 | HR 62 | Ht 65.0 in | Wt 184.2 lb

## 2021-08-27 DIAGNOSIS — I10 Essential (primary) hypertension: Secondary | ICD-10-CM | POA: Diagnosis not present

## 2021-08-27 DIAGNOSIS — I480 Paroxysmal atrial fibrillation: Secondary | ICD-10-CM

## 2021-08-27 DIAGNOSIS — C50919 Malignant neoplasm of unspecified site of unspecified female breast: Secondary | ICD-10-CM | POA: Diagnosis not present

## 2021-08-27 DIAGNOSIS — I447 Left bundle-branch block, unspecified: Secondary | ICD-10-CM

## 2021-08-27 DIAGNOSIS — E669 Obesity, unspecified: Secondary | ICD-10-CM | POA: Diagnosis not present

## 2021-08-27 DIAGNOSIS — Z7952 Long term (current) use of systemic steroids: Secondary | ICD-10-CM | POA: Diagnosis not present

## 2021-08-27 DIAGNOSIS — I25118 Atherosclerotic heart disease of native coronary artery with other forms of angina pectoris: Secondary | ICD-10-CM | POA: Diagnosis not present

## 2021-08-27 DIAGNOSIS — Z952 Presence of prosthetic heart valve: Secondary | ICD-10-CM | POA: Diagnosis not present

## 2021-08-27 DIAGNOSIS — M8580 Other specified disorders of bone density and structure, unspecified site: Secondary | ICD-10-CM | POA: Diagnosis not present

## 2021-08-27 DIAGNOSIS — Z6830 Body mass index (BMI) 30.0-30.9, adult: Secondary | ICD-10-CM | POA: Diagnosis not present

## 2021-08-27 DIAGNOSIS — M316 Other giant cell arteritis: Secondary | ICD-10-CM | POA: Diagnosis not present

## 2021-08-27 DIAGNOSIS — M353 Polymyalgia rheumatica: Secondary | ICD-10-CM | POA: Diagnosis not present

## 2021-08-27 NOTE — Patient Instructions (Addendum)
Medication Instructions:  ?Your physician has recommended you make the following change in your medication:  ?STOP AMIODARONE   ?*If you need a refill on your cardiac medications before your next appointment, please call your pharmacy* ? ? ?Lab Work: ?NONE ?If you have labs (blood work) drawn today and your tests are completely normal, you will receive your results only by: ?MyChart Message (if you have MyChart) OR ?A paper copy in the mail ?If you have any lab test that is abnormal or we need to change your treatment, we will call you to review the results. ? ? ?Testing/Procedures: ?NONE ? ? ?Follow-Up: ?At [facility], you and your health needs are our priority.  As part of our continuing mission to provide you with exceptional heart care, we have created designated Provider Care Teams.  These Care Teams include your primary Cardiologist (physician) and Advanced Practice Providers (APPs -  Physician Assistants and Nurse Practitioners) who all work together to provide you with the care you need, when you need it. ? ?We recommend signing up for the patient portal called "MyChart".  Sign up information is provided on this After Visit Summary.  MyChart is used to connect with patients for Virtual Visits (Telemedicine).  Patients are able to view lab/test results, encounter notes, upcoming appointments, etc.  Non-urgent messages can be sent to your provider as well.   ?To learn more about what you can do with MyChart, go to [email or web address].   ? ?Your next appointment:   ?KEEP SCHEDULED FOLLOW-UP ? ?YOU HAVE BEEN REFERRED TO CARDIAC REHAB. ? ?Important Information About Sugar ? ? ? ? ?  ?

## 2021-08-28 ENCOUNTER — Encounter: Payer: Self-pay | Admitting: Genetic Counselor

## 2021-08-28 ENCOUNTER — Inpatient Hospital Stay: Payer: Medicare Other

## 2021-08-28 ENCOUNTER — Encounter: Payer: Self-pay | Admitting: *Deleted

## 2021-08-28 ENCOUNTER — Other Ambulatory Visit: Payer: Self-pay | Admitting: Genetic Counselor

## 2021-08-28 ENCOUNTER — Other Ambulatory Visit: Payer: Self-pay

## 2021-08-28 ENCOUNTER — Inpatient Hospital Stay: Payer: Medicare Other | Attending: Hematology and Oncology | Admitting: Genetic Counselor

## 2021-08-28 DIAGNOSIS — Z1379 Encounter for other screening for genetic and chromosomal anomalies: Secondary | ICD-10-CM

## 2021-08-28 DIAGNOSIS — Z170 Estrogen receptor positive status [ER+]: Secondary | ICD-10-CM

## 2021-08-28 DIAGNOSIS — Z803 Family history of malignant neoplasm of breast: Secondary | ICD-10-CM | POA: Insufficient documentation

## 2021-08-28 DIAGNOSIS — Z853 Personal history of malignant neoplasm of breast: Secondary | ICD-10-CM | POA: Diagnosis not present

## 2021-08-28 DIAGNOSIS — C50512 Malignant neoplasm of lower-outer quadrant of left female breast: Secondary | ICD-10-CM

## 2021-08-28 LAB — GENETIC SCREENING ORDER

## 2021-08-28 NOTE — Progress Notes (Signed)
REFERRING PROVIDER: Nicholas Lose, MD [place], [place] [address]  PRIMARY PROVIDER:  Crist Infante, MD  PRIMARY REASON FOR VISIT:  1. Malignant neoplasm of lower-outer quadrant of left breast of female, estrogen receptor positive ([place])   2. Family history of breast cancer     HISTORY OF PRESENT ILLNESS:   Marissa Powers, a 81 y.o. female, was seen for a [facility] cancer genetics consultation at the request of Dr. Lindi Adie due to a personal and family history of cancer.  Marissa Powers presents to clinic today to discuss the possibility of a hereditary predisposition to cancer, to discuss genetic testing, and to further clarify her future cancer risks, as well as potential cancer risks for family members.   In April 2023, at the age of 46, Marissa Powers was diagnosed with invasive lobular carcinoma of the left breast.   CANCER HISTORY:  Oncology History  Malignant neoplasm of lower-outer quadrant of left breast of female, estrogen receptor positive ([place])  07/18/2021 Initial Diagnosis   Left breast global asymmetry/density measuring 8 x 4.4 x 4.7 cm.  Ultrasound showed 2 focal masses.  3.1 cm: Biopsy grade 2 ILC ER 90%, PR 10%, HER2 equivocal, FISH negative, Ki-67 10%; lymph node biopsy: Positive Breast MRI abnormality left breast measured 8 x 4.2 x 4.1 cm     RISK FACTORS:  Ovaries intact: yes.  Uterus intact: yes.  Menopausal status: postmenopausal.  HRT use: 0 years. Colonoscopy: yes;  history of benign polyps . Mammogram within the last year: yes. 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 Angelena Form & Dr. Cyndia Bent .   Severe aortic stenosis     Past Surgical History:  Procedure Laterality Date   BREAST BIOPSY Bilateral 1970's   x2 one a side   CATARACT EXTRACTION     bilateral   implantable loop recorder removal  05/30/2021   MDT reveal LINQ removed by Dr Rayann Heman   INTRAOPERATIVE TRANSTHORACIC ECHOCARDIOGRAM N/A 08/19/2021   Procedure: INTRAOPERATIVE TRANSTHORACIC ECHOCARDIOGRAM;  Surgeon: Burnell Blanks, MD;  Location: [facility];  Service: Open Heart Surgery;  Laterality: N/A;   LOOP RECORDER INSERTION N/A 08/11/2017   Procedure: LOOP RECORDER INSERTION;  Surgeon: Thompson Grayer, MD;  Location: [facility];  Service: Cardiovascular;  Laterality: N/A;   RETINAL DETACHMENT SURGERY  2000   RIGHT/LEFT HEART CATH AND CORONARY ANGIOGRAPHY N/A 06/19/2021   Procedure: RIGHT/LEFT HEART CATH AND CORONARY ANGIOGRAPHY;  Surgeon: Belva Crome, MD;  Location: [facility];  Service: Cardiovascular;  Laterality: N/A;   THYROIDECTOMY  1967   TONSILLECTOMY     TOTAL KNEE ARTHROPLASTY  04/25/2012   Procedure: TOTAL KNEE BILATERAL;  Surgeon: Gearlean Alf, MD;  Location: [facility];  Service: Orthopedics;  Laterality: Bilateral;   TRANSCATHETER  AORTIC VALVE REPLACEMENT, TRANSFEMORAL N/A 08/19/2021   Procedure: Transcatheter Aortic Valve Replacement, Transfemoral Using 55m Sapien 3 Ultra Edwards Valve;  Surgeon: MBurnell Blanks MD;  Location: [facility]  Service: Open Heart Surgery;  Laterality: N/A;    Social History   Socioeconomic History   Marital status: Widowed    Spouse name: Not on file   Number of children: 2   Years of education: Not on file   Highest education level: Not on file  Occupational History   Not on file  Tobacco Use   Smoking status: Never     Passive exposure: Never   Smokeless tobacco: Never  Vaping Use   Vaping Use: Never used  Substance and Sexual Activity   Alcohol use: Yes    Alcohol/week: 4.0 - 6.0 standard drinks    Types: 4 - 6 Standard drinks or equivalent per week   Drug use: No   Sexual activity: Not Currently    Partners: Male    Birth control/protection: Post-menopausal  Other Topics Concern   Not on file  Social History Narrative   Not on file   Social Determinants of Health   Financial Resource Strain: Low Risk    Difficulty of Paying Living Expenses: Not hard at all  Food Insecurity: No Food Insecurity   Worried About RCharity fundraiserin the Last Year: Never true   RSewardin the Last Year: Never true  Transportation Needs: No Transportation Needs   Lack of Transportation (Medical): No   Lack of Transportation (Non-Medical): No  Physical Activity: Not on file  Stress: Not on file  Social Connections: Not on file     FAMILY HISTORY:  We obtained a detailed, 4-generation family history.  Significant diagnoses are listed below: Family History  Problem Relation Age of Onset   Aortic aneurysm Mother    Clotting disorder Mother    Healthy Father    Heart disease Brother        stints   Breast cancer Maternal Aunt 560  Breast cancer Maternal Grandmother 69   Heart attack Maternal Grandmother      Marissa Powers's maternal aunt was diagnosed with breast cancer at age 765and died due to metastatic breast cancer. Her maternal grandmother was diagnosed with breast cancer at age 80 she is deceased. Ms. OHinzmanis unaware of previous family history of genetic testing for hereditary cancer risks. There is no reported Ashkenazi Jewish ancestry.   GENETIC COUNSELING ASSESSMENT: Ms. OShimkois a 81y.o. female with a personal and family history of cancer which is somewhat suggestive of a hereditary predisposition to cancer. We, therefore, discussed and recommended the following at today's visit.    DISCUSSION: We discussed that 5 - 10% of cancer is hereditary, with most cases of breast cancer associated with BRCA1/2.  There are other genes that can be associated with hereditary breast cancer syndromes.  We discussed that testing is beneficial for several reasons including knowing how to follow individuals after completing their treatment, identifying whether potential treatment options would be beneficial, and understanding if other family members could be at risk for cancer and allowing them to undergo genetic testing.   We reviewed the characteristics, features and inheritance patterns of hereditary cancer syndromes. We also discussed genetic testing, including the appropriate family members to test, the process of testing, insurance coverage and turn-around-time for results. We discussed the implications of a negative, positive, carrier and/or variant of uncertain significant result.  Marissa Powers  was offered a common hereditary cancer panel (36 genes) and an expanded pan-cancer panel (77 genes). Marissa Powers was informed of the benefits and limitations of each panel, including that expanded pan-cancer panels contain genes that do not have clear management guidelines at this point in time.  We also discussed that as the number of genes included on a panel increases, the chances of variants of uncertain significance increases.  After considering the benefits and limitations of each gene panel, Marissa Powers  elected to have [employer].  The CancerNext gene panel offered by [employer] includes sequencing, rearrangement analysis, and RNA analysis for the following 36 genes:   APC, ATM, AXIN2, BARD1, BMPR1A, BRCA1, BRCA2, BRIP1, CDH1, CDK4, CDKN2A, CHEK2, DICER1, HOXB13, EPCAM, GREM1, MLH1, MSH2, MSH3, MSH6, MUTYH, NBN, NF1, NTHL1, PALB2, PMS2, POLD1, POLE, PTEN, RAD51C, RAD51D, RECQL, SMAD4, SMARCA4, STK11, and TP53.   Based on Marissa Powers's personal and family history of cancer, she meets  medical criteria for genetic testing. Despite that she meets criteria, she may still have an out of pocket cost. We discussed that if her out of pocket cost for testing is over $100, the laboratory will call and confirm whether she wants to proceed with testing.  If the out of pocket cost of testing is less than $100 she will be billed by the genetic testing laboratory.   PLAN: After considering the risks, benefits, and limitations, Marissa Powers provided informed consent to pursue genetic testing and the blood sample was sent to [facility] for analysis of the CancerNext Panel. Results should be available within approximately 2-3 weeks' time, at which point they will be disclosed by telephone to Ms. Schmuck, as will any additional recommendations warranted by these results. Marissa Powers will receive a summary of her genetic counseling visit and a copy of her results once available. This information will also be available in Epic.   Marissa Powers questions were answered to her satisfaction today. Our contact information was provided should additional questions or concerns arise. Thank you for the referral and allowing [place] to share in the care of your patient.   Lucille Passy, MS, [facility] Genetic Counselor [email or web address] (P) [phone]  The patient was seen for a total of 40 minutes in face-to-face genetic counseling.  The patient brought her friend. Drs. Lindi Adie and/or Burr Medico were available to discuss this case as needed.   _______________________________________________________________________ For Office Staff:  Number of people involved in session: 2 Was an Intern/ student involved with case: no

## 2021-09-01 ENCOUNTER — Other Ambulatory Visit: Payer: Self-pay | Admitting: Physician Assistant

## 2021-09-01 MED ORDER — AMOXICILLIN 500 MG PO CAPS
2000.0000 mg | ORAL_CAPSULE | ORAL | 12 refills | Status: DC
Start: 2021-09-01 — End: 2022-03-24

## 2021-09-02 ENCOUNTER — Encounter: Payer: Self-pay | Admitting: Pulmonary Disease

## 2021-09-02 ENCOUNTER — Telehealth: Payer: Self-pay | Admitting: Pharmacy Technician

## 2021-09-02 NOTE — Telephone Encounter (Addendum)
Auth Submission: no auth needed Payer: medicare Medication & CPT/J Code(s) submitted: Prolia (Denosumab) [phone] Route of submission (phone, fax, portal): amgen portal - BIV Auth type: Buy/Bill Units/visits requested: 1 DOSE Q6MO Reference number:  Approval from: 09/02/21 to 09/03/23  Patient may be responsible for $301 co-pay In the event co-pay is not needed patient will be reimbursed.  Medicare coverage confirmed and approval extended to 09/03/23

## 2021-09-03 ENCOUNTER — Telehealth (HOSPITAL_COMMUNITY): Payer: Self-pay

## 2021-09-03 NOTE — Telephone Encounter (Signed)
Called and spoke with pt in regards to CR, pt stated she will be going to the beach and will not return until some time in the fall.    Closed referral

## 2021-09-10 DIAGNOSIS — E785 Hyperlipidemia, unspecified: Secondary | ICD-10-CM | POA: Diagnosis not present

## 2021-09-10 DIAGNOSIS — I480 Paroxysmal atrial fibrillation: Secondary | ICD-10-CM | POA: Diagnosis not present

## 2021-09-10 DIAGNOSIS — K219 Gastro-esophageal reflux disease without esophagitis: Secondary | ICD-10-CM | POA: Diagnosis not present

## 2021-09-10 DIAGNOSIS — I10 Essential (primary) hypertension: Secondary | ICD-10-CM | POA: Diagnosis not present

## 2021-09-10 NOTE — Addendum Note (Signed)
Encounter addended by: Gerarda Gunther on: 09/10/2021 2:47 PM  Actions taken: Imaging Exam ended

## 2021-09-12 ENCOUNTER — Telehealth: Payer: Self-pay | Admitting: Genetic Counselor

## 2021-09-12 ENCOUNTER — Encounter: Payer: Self-pay | Admitting: Genetic Counselor

## 2021-09-12 DIAGNOSIS — Z1379 Encounter for other screening for genetic and chromosomal anomalies: Secondary | ICD-10-CM | POA: Insufficient documentation

## 2021-09-12 NOTE — Telephone Encounter (Signed)
I attempted to contact Ms. Honeyman to discuss her genetic testing results (36 genes). I left a voicemail requesting she call me back at [phone].  Lucille Passy, MS, [facility] Genetic Counselor [email or web address] (P) [phone]

## 2021-09-15 ENCOUNTER — Telehealth: Payer: Self-pay | Admitting: Genetic Counselor

## 2021-09-15 DIAGNOSIS — Z7952 Long term (current) use of systemic steroids: Secondary | ICD-10-CM | POA: Diagnosis not present

## 2021-09-15 DIAGNOSIS — M8589 Other specified disorders of bone density and structure, multiple sites: Secondary | ICD-10-CM | POA: Diagnosis not present

## 2021-09-15 NOTE — Telephone Encounter (Signed)
I contacted Marissa Powers to discuss her genetic testing results. No pathogenic variants were identified in the 36 genes analyzed. 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 Detailed clinic note to follow.  The test report has been scanned into EPIC and is located under the Molecular Pathology section of the Results Review tab.  A portion of the result report is included below for reference.   Lucille Passy, MS, [facility] Genetic Counselor [email or web address] (P) [phone]

## 2021-09-17 ENCOUNTER — Ambulatory Visit (INDEPENDENT_AMBULATORY_CARE_PROVIDER_SITE_OTHER): Payer: Medicare Other | Admitting: Physician Assistant

## 2021-09-17 ENCOUNTER — Ambulatory Visit (HOSPITAL_COMMUNITY): Payer: Medicare Other | Attending: Cardiology

## 2021-09-17 ENCOUNTER — Encounter: Payer: Self-pay | Admitting: *Deleted

## 2021-09-17 VITALS — BP 162/76 | HR 67 | Ht 65.0 in | Wt 184.4 lb

## 2021-09-17 DIAGNOSIS — I447 Left bundle-branch block, unspecified: Secondary | ICD-10-CM | POA: Diagnosis not present

## 2021-09-17 DIAGNOSIS — C50919 Malignant neoplasm of unspecified site of unspecified female breast: Secondary | ICD-10-CM

## 2021-09-17 DIAGNOSIS — Z952 Presence of prosthetic heart valve: Secondary | ICD-10-CM

## 2021-09-17 DIAGNOSIS — I480 Paroxysmal atrial fibrillation: Secondary | ICD-10-CM | POA: Diagnosis not present

## 2021-09-17 DIAGNOSIS — I25118 Atherosclerotic heart disease of native coronary artery with other forms of angina pectoris: Secondary | ICD-10-CM | POA: Diagnosis not present

## 2021-09-17 DIAGNOSIS — I10 Essential (primary) hypertension: Secondary | ICD-10-CM | POA: Diagnosis not present

## 2021-09-17 LAB — ECHOCARDIOGRAM COMPLETE
AR max vel: 1.75 cm2
AV Area VTI: 1.75 cm2
AV Area mean vel: 1.69 cm2
AV Mean grad: 9.8 mmHg
AV Peak grad: 17.3 mmHg
Ao pk vel: 2.08 m/s
Area-P 1/2: 4.8 cm2
S' Lateral: 3 cm

## 2021-09-17 NOTE — Progress Notes (Signed)
HEART AND [place]                                     Cardiology Office Note:    Date:  09/17/2021   ID:  Marissa Powers, DOB March 13, 1941, MRN [id number]  PCP:  Crist Infante, [facility] Cardiologist:  Sinclair Grooms, MD  / Dr. Angelena Form & Dr. Cyndia Bent (TAVR) [facility] Electrophysiologist:   Grayer, MD   Referring MD: Crist Infante, MD   1 month s/p TAVR  History of Present Illness:    Marissa Powers is a 81 y.o. 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   She has been followed by Dr. Tamala Julian for moderate aortic stenosis. She recently has had worsening shortness of breath and fatigue with exertion. Repeat 2D echo on 06/09/2021 showed EF 60-65%, G2DD, and severe AS with a calcified aortic valve with a mean gradient of 40 mmHg, peak gradient of 63 mmHg and AVA of 0.85 cm. L/RHC on 06/19/21 showed severe aortic stenosis with mean transvalvular gradient 50.3 mmHg, and calculated aortic valve area 0.89 cm as well as widely patent left main, LAD, circumflex, and 70 to 80% stenosis of the mid RCA. Medical therapy was recommended.    She was admitted 3/31-07/13/21 for Covid 19 c/b afib with RVR and hypotension. She was treated with molnupiravir and decadron. She was treated with IV amio and discharged on amiodarone load.  Plan was to keep amio on until she got through TAVR with a plan to stop a couple weeks after TAVR.    She was recently diagnosed with left breast cancer found on routine screening.  She subsequently had an ultrasound showing multiple nodules in the left breast and an MRI showing 8 cm of multifocal nodular disease in the left breast.  She underwent a biopsy that showed  invasive lobular cancer and had 1 large lymph node which on biopsy was positive for breast cancer.  She has been seen by Dr. Sonny Dandy and it is felt that she will require mastectomy and radiation therapy.  She was started on neoadjuvant antiestrogen therapy until surgery.   She was evaluated by the multidisciplinary valve team and underwent successful TAVR with a 23 mm Edwards Sapien 3 Ultra Resilia THV via the TF approach on 08/19/21. Post operative echo showed EF 60%, normally functioning TAVR with a mean gradient of 17 mmHg and no PVL.There was some degree of patient prosthesis mismatch. She was discharged on home Eliquis '5mg'$  BID. She did have some issues with fever but no localizing sx. She had a new LBBB and so a Zio At was placed at discharge. She subsequently complained of some dizziness and was a little disappointed that she was not feeling better after TAVR.   Zio AT showed a basic underlying rhythm is NSR and sinus bradycardia. Average HR 60 bpm. No sinus rhythm rate > 100 bpm. There was no afib or HAVB. Per Dr. Tamala Julian, chronotropic incompetence is not excluded by this study. Amiodarone was discontinued at her post op visit.   Today the patient presents to clinic for follow up. Trying to walk 10K steps a day. Still  a little disappointed due to not "feeling fabulous' as she expected . She does feel dizziness from time to time and has an unsteady gait. She still does get short of breath but overall feels she is doing okay. Ready to go to the beach. No CP. No LE edema, orthopnea or PND. No dizziness or syncope. No blood in stool or urine. No palpitations.    Past Medical History:  Diagnosis Date   Arthritis    osteoarthritis   CAD (coronary artery disease)    Dysplasia of cervix, low grade (CIN 1) 1990   HPV, Cryo, Laser   Fibroadenoma    Left, at 5 o'clock, not excised    Fibroid 2002   1 cm, 2 cm   GERD (gastroesophageal reflux disease)    Hypercholesteremia    Hypertension     Hyperthyroidism    PAF (paroxysmal atrial fibrillation) (HCC)    on Eliquis   PMR (polymyalgia rheumatica) (HCC)    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   s/p TAVR with a 23 mm Edwards S3UR via the TF approach by Dr. Angelena Form & Dr. Cyndia Bent .   Severe aortic stenosis     Past Surgical History:  Procedure Laterality Date   BREAST BIOPSY Bilateral 1970's   x2 one a side   CATARACT EXTRACTION     bilateral   implantable loop recorder removal  05/30/2021   MDT reveal LINQ removed by Dr Rayann Heman   INTRAOPERATIVE TRANSTHORACIC ECHOCARDIOGRAM N/A 08/19/2021   Procedure: INTRAOPERATIVE TRANSTHORACIC ECHOCARDIOGRAM;  Surgeon: Burnell Blanks, MD;  Location: [place];  Service: Open Heart Surgery;  Laterality: N/A;   LOOP RECORDER INSERTION N/A 08/11/2017   Procedure: LOOP RECORDER INSERTION;  Surgeon:  Grayer, MD;  Location: [place] [facility];  Service: Cardiovascular;  Laterality: N/A;   RETINAL DETACHMENT SURGERY  2000   RIGHT/LEFT HEART CATH AND CORONARY ANGIOGRAPHY N/A 06/19/2021   Procedure: RIGHT/LEFT HEART CATH AND CORONARY ANGIOGRAPHY;  Surgeon: Belva Crome, MD;  Location: [place] [facility];  Service: Cardiovascular;  Laterality: N/A;   THYROIDECTOMY  1967   TONSILLECTOMY     TOTAL KNEE ARTHROPLASTY  04/25/2012   Procedure: TOTAL KNEE BILATERAL;  Surgeon: Gearlean Alf, MD;  Location: [facility];  Service: Orthopedics;  Laterality: Bilateral;   TRANSCATHETER AORTIC VALVE REPLACEMENT, TRANSFEMORAL N/A 08/19/2021   Procedure: Transcatheter Aortic Valve Replacement, Transfemoral Using 57m Sapien 3 Ultra Edwards Valve;  Surgeon: MBurnell Blanks MD;  Location: [place]  Service: Open Heart Surgery;  Laterality: N/A;    Current Medications: Current Meds  Medication Sig   acetaminophen (TYLENOL) 500 MG tablet Take 1,000 mg by mouth every 6 (six) hours as needed (pain.).   amoxicillin (AMOXIL) 500 MG capsule Take 4 capsules (2,000 mg total) by mouth  See admin instructions. Take 4 capsules (2000 mg) by mouth 1 hour prior to dental appointments   apixaban (ELIQUIS) 5 MG TABS tablet Take 1 tablet (5 mg total) by mouth 2 (two) times daily. Resume Apixaban at 8 PM 06/19/2921   B-D TB SYRINGE 1CC/27GX1/2" 27G X 1/2" 1 ML MISC Inject into the skin once a week.   Calcium Carb-Cholecalciferol (CALCIUM + D3 PO) Take 1 tablet by mouth in the morning.   cholecalciferol (VITAMIN D3) 25 MCG (1000 UNIT) tablet Take 1,000 Units by mouth in the morning.   Cyanocobalamin (B-12 PO) Take 1,000 mcg by mouth in the morning.   denosumab (PROLIA) 60 MG/ML SOSY injection Inject 60 mg into  the skin every 6 (six) months.   folic acid (FOLVITE) 1 MG tablet Take 2 mg by mouth in the morning.   letrozole (FEMARA) 2.5 MG tablet Take 1 tablet (2.5 mg total) by mouth daily.   levothyroxine (SYNTHROID, LEVOTHROID) 88 MCG tablet Take 88 mcg by mouth daily before breakfast.   loperamide (IMODIUM) 2 MG capsule Take 2-4 mg by mouth 4 (four) times daily as needed (upset stomach/diarrhea/loose stools).   methotrexate 50 MG/2ML injection Inject 25 mg into the skin every Wednesday.   metoprolol succinate (TOPROL XL) 25 MG 24 hr tablet Take 1 tablet (25 mg total) by mouth daily.   mometasone (ELOCON) 0.1 % ointment Apply topically to vulvar skin no more than twice weekly   nitroGLYCERIN (NITROSTAT) 0.4 MG SL tablet Place 1 tablet (0.4 mg total) under the tongue every 5 (five) minutes as needed for chest pain.   nystatin cream (MYCOSTATIN) Apply 1 application. topically 2 (two) times daily. Apply to affected area BID for up to 7 days to skin under breasts   pantoprazole (PROTONIX) 40 MG tablet Take 40 mg by mouth 2 (two) times daily as needed.   predniSONE (DELTASONE) 1 MG tablet Take 1 mg by mouth daily with breakfast. continuous   REPATHA SURECLICK 505 MG/ML SOAJ Inject 140 mg into the skin every 14 (fourteen) days.   telmisartan (MICARDIS) 20 MG tablet Take 20 mg by mouth every  evening.   zolpidem (AMBIEN) 5 MG tablet Take 2.5 mg by mouth at bedtime.     Allergies:   Cashew nut oil   Social History   Socioeconomic History   Marital status: Widowed    Spouse name: Not on file   Number of children: 2   Years of education: Not on file   Highest education level: Not on file  Occupational History   Not on file  Tobacco Use   Smoking status: Never    Passive exposure: Never   Smokeless tobacco: Never  Vaping Use   Vaping Use: Never used  Substance and Sexual Activity   Alcohol use: Yes    Alcohol/week: 4.0 - 6.0 standard drinks    Types: 4 - 6 Standard drinks or equivalent per week   Drug use: No   Sexual activity: Not Currently    Partners: Male    Birth control/protection: Post-menopausal  Other Topics Concern   Not on file  Social History Narrative   Not on file   Social Determinants of Health   Financial Resource Strain: Low Risk    Difficulty of Paying Living Expenses: Not hard at all  Food Insecurity: No Food Insecurity   Worried About Charity fundraiser in the Last Year: Never true   [place] in the Last Year: Never true  Transportation Needs: No Transportation Needs   Lack of Transportation (Medical): No   Lack of Transportation (Non-Medical): No  Physical Activity: Not on file  Stress: Not on file  Social Connections: Not on file     Family History: The patient's family history includes Aortic aneurysm in her mother; Breast cancer (age of onset: 44) in her maternal aunt; Breast cancer (age of onset: 54) in her maternal grandmother; Clotting disorder in her mother; Healthy in her father; Heart attack in her maternal grandmother; Heart disease in her brother.  ROS:   Please see the history of present illness.    All other systems reviewed and are negative.  EKGs/Labs/Other Studies Reviewed:    The following  studies were reviewed today:  TAVR OPERATIVE NOTE     Date of Procedure:                08/19/2021    Preoperative Diagnosis:      Severe Aortic Stenosis    Postoperative Diagnosis:    Same    Procedure:        Transcatheter Aortic Valve Replacement - Transfemoral Approach             Edwards Sapien 3 THV (size 23 mm, model # T562222 serial # [id number])              Co-Surgeons:                        Lauree Chandler, MD and Gaye Pollack, MD   Anesthesiologist:                  [place]us   Echocardiographer:              Gasper Sells   Pre-operative Echo Findings: Severe aortic stenosis Normal left ventricular systolic function   Post-operative Echo Findings: No paravalvular leak Normal left ventricular systolic function _____________     Echo 08/20/21:  IMPRESSIONS   1. The aortic valve has been replaced by a 23 mm Edwards Valve. Aortic  valve regurgitation is not visualized (neither central nor paravalvular).  Mean gradient of 17 mm Hg, Peak gradient 29 mm Hg. Poor LVOT Doppler  acquition precludes DVI or EOA  assessment. Acceleration time 98 ms. Suspect degree of patient prosthesis  mismatch.   2. Left ventricular ejection fraction, by estimation, is 60 to 65%. The  left ventricle has normal function. The left ventricle has no regional  wall motion abnormalities. There is moderate concentric left ventricular  hypertrophy. Left ventricular  diastolic parameters are consistent with Grade I diastolic dysfunction  (impaired relaxation).   3. Right ventricular systolic function is normal. The right ventricular  size is normal. There is normal pulmonary artery systolic pressure. The  estimated right ventricular systolic pressure is 83.6 mmHg.   4. Left atrial size was mildly dilated.   5. A small pericardial effusion is present. The pericardial effusion is  anterior to the right ventricle.   6. The mitral valve is normal in structure. No evidence of mitral valve  regurgitation. No evidence of mitral stenosis.   7. The inferior vena cava is normal in size with  greater than 50%  respiratory variability, suggesting right atrial pressure of 3 mmHg.   Comparison(s): Elevated gradients from prior without evidence of  prosthetic valve thrombosis.   Conclusion(s)/Recommendation(s): In next study, careful LVOT gradient  assessment is needed.   ________________________  Echo 09/17/21 IMPRESSIONS   1. The aortic valve has been repaired/replaced. Aortic valve regurgitation is not visualized. There is a 23 mm Sapien prosthetic (TAVR) valve present in the aortic position. Procedure Date: 08/19/2021. Echo findings are consistent with normal structure  and function of the aortic valve prosthesis. Aortic valve area, by VTI measures 1.75 cm. Aortic valve mean gradient measures 9.8 mmHg. Aortic valve Vmax measures 2.08 m/s.  2. Left ventricular ejection fraction, by estimation, is 55 to 60%. The left ventricle has normal function. The left ventricle has no regional wall motion abnormalities. Left ventricular diastolic parameters are consistent with Grade II diastolic  dysfunction (pseudonormalization).  3. Right ventricular systolic function is normal. The right ventricular size is normal.  4. The mitral valve is grossly normal.  Trivial mitral valve regurgitation. No evidence of mitral stenosis.  5. The inferior vena cava is normal in size with greater than 50% respiratory variability, suggesting right atrial pressure of 3 mmHg.   Comparison(s): No significant change from prior study. Normal prosthesis.    EKG:  EKG is NOT ordered today.    Recent Labs: 08/15/2021: ALT 14 08/20/2021: BUN 6; Creatinine, Ser 0.88; Hemoglobin 9.3; Magnesium 2.0; Platelets 149; Potassium 3.8; Sodium 139  Recent Lipid Panel    Component Value Date/Time   CHOL 122 10/28/2018 0832   TRIG 100 07/11/2021 0945   HDL 55 10/28/2018 0832   CHOLHDL 2.2 10/28/2018 0832   LDLCALC 45 10/28/2018 0832     Risk Assessment/Calculations:    CHA2DS2-VASc Score = 5   This indicates a 7.2%  annual risk of stroke. The patient's score is based upon: CHF History: 0 HTN History: 1 Diabetes History: 0 Stroke History: 0 Vascular Disease History: 1 Age Score: 2 Gender Score: 1      Physical Exam:    VS:  BP (!) 162/76   Pulse 67   Ht '5\' 5"'$  (1.651 m)   Wt 184 lb 6.4 oz (83.6 kg)   SpO2 97%   BMI 30.69 kg/m     Wt Readings from Last 3 Encounters:  09/17/21 184 lb 6.4 oz (83.6 kg)  08/27/21 184 lb 3.2 oz (83.6 kg)  08/20/21 181 lb 9.6 oz (82.4 kg)     GEN:  Well nourished, well developed in no acute distress HEENT: Normal NECK: No JVD LYMPHATICS: No lymphadenopathy CARDIAC: RRR, no murmurs, rubs, gallops RESPIRATORY:  Clear to auscultation without rales, wheezing or rhonchi  ABDOMEN: Soft, non-tender, non-distended MUSCULOSKELETAL:  No edema; No deformity  SKIN: Warm and dry.   NEUROLOGIC:  Alert and oriented x 3 PSYCHIATRIC:  Normal affect   ASSESSMENT:    1. S/P TAVR (transcatheter aortic valve replacement)   2. LBBB (left bundle branch block)   3. Paroxysmal atrial fibrillation (HCC)   4. Coronary artery disease of native artery of native heart with stable angina pectoris ([place])   5. Primary hypertension   6. Malignant neoplasm of female breast, unspecified estrogen receptor status, unspecified laterality, unspecified site of breast ([place])     PLAN:    In order of problems listed above:  Severe AS s/p TAVR: echo today shows EF 55%, normally functioning TAVR with a mean gradient of 9.8 mm hg and no PVL. She has NYHA class II symptoms. She has amoxicillin for SBE prophylaxis. Continue on Eliquis. I will see her back in 1 year for follow up and echo.    New persistent LBBB: completed Zio patch with no high risk alerts. Chorontropic incompetence not ruled out by this study   PAF: no recurrence off amiodarone. Continue Eliquis '5mg'$  BID.    CAD: pre TAVR cath 06/19/21 showed widely patent left main, LAD, circumflex, and 70 to 80% stenosis of the mid RCA.  Medical therapy was recommended.    HTN: BP quite elevated today and similar on my personal recheck. She says this is very atypical and will watch at home and let us know if it remains elevated.    Breast CA: continue letrozole with plans for eventual mastectomy. Followed by Dr. Lindi Adie.    Medication Adjustments/Labs and Tests Ordered: Current medicines are reviewed at length with the patient today.  Concerns regarding medicines are outlined above.  Orders Placed This Encounter  Procedures   ECHOCARDIOGRAM COMPLETE   No orders  of the defined types were placed in this encounter.   Patient Instructions  Medication Instructions:  Your physician recommends that you continue on your current medications as directed. Please refer to the Current Medication list given to you today.  *If you need a refill on your cardiac medications before your next appointment, please call your pharmacy*   Lab Work: None ordered   If you have labs (blood work) drawn today and your tests are completely normal, you will receive your results only by: [place] (if you have MyChart) OR A paper copy in the mail If you have any lab test that is abnormal or we need to change your treatment, we will call you to review the results.   Testing/Procedures: Your physician has requested that you have an echocardiogram in May 2024 . Echocardiography is a painless test that uses sound waves to create images of your heart. It provides your doctor with information about the size and shape of your heart and how well your heart's chambers and valves are working. This procedure takes approximately one hour. There are no restrictions for this procedure.    Follow-Up: Follow up as scheduled   Other Instructions   Important Information About Sugar         Signed, Angelena Form, PA-C  09/17/2021 8:44 PM    [facility]

## 2021-09-17 NOTE — Patient Instructions (Signed)
Medication Instructions:  Your physician recommends that you continue on your current medications as directed. Please refer to the Current Medication list given to you today.  *If you need a refill on your cardiac medications before your next appointment, please call your pharmacy*   Lab Work: None ordered   If you have labs (blood work) drawn today and your tests are completely normal, you will receive your results only by: [place] (if you have MyChart) OR A paper copy in the mail If you have any lab test that is abnormal or we need to change your treatment, we will call you to review the results.   Testing/Procedures: Your physician has requested that you have an echocardiogram in May 2024 . Echocardiography is a painless test that uses sound waves to create images of your heart. It provides your doctor with information about the size and shape of your heart and how well your heart's chambers and valves are working. This procedure takes approximately one hour. There are no restrictions for this procedure.    Follow-Up: Follow up as scheduled   Other Instructions   Important Information About Sugar

## 2021-09-18 ENCOUNTER — Encounter: Payer: Self-pay | Admitting: Genetic Counselor

## 2021-09-19 ENCOUNTER — Ambulatory Visit: Payer: Self-pay | Admitting: Genetic Counselor

## 2021-09-19 DIAGNOSIS — Z1379 Encounter for other screening for genetic and chromosomal anomalies: Secondary | ICD-10-CM

## 2021-09-19 NOTE — Progress Notes (Signed)
HPI:   Ms. Pokorney was previously seen in the [facility] clinic due to a personal and family history of cancer and concerns regarding a hereditary predisposition to cancer. Please refer to our prior cancer genetics clinic note for more information regarding our discussion, assessment and recommendations, at the time. Ms. Shutters recent genetic test results were disclosed to her, as were recommendations warranted by these results. These results and recommendations are discussed in more detail below.  CANCER HISTORY:  Oncology History  Malignant neoplasm of lower-outer quadrant of left breast of female, estrogen receptor positive ([place])  07/18/2021 Initial Diagnosis   Left breast global asymmetry/density measuring 8 x 4.4 x 4.7 cm.  Ultrasound showed 2 focal masses.  3.1 cm: Biopsy grade 2 ILC ER 90%, PR 10%, HER2 equivocal, FISH negative, Ki-67 10%; lymph node biopsy: Positive Breast MRI abnormality left breast measured 8 x 4.2 x 4.1 cm    Genetic Testing   [facility] CancerNext Panel was Negative. 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 Report date is 09/11/2021.  The CancerNext gene panel offered by [employer] includes sequencing, rearrangement analysis, and RNA analysis for the following 36 genes:   APC, ATM, AXIN2, BARD1, BMPR1A, BRCA1, BRCA2, BRIP1, CDH1, CDK4, CDKN2A, CHEK2, DICER1, HOXB13, EPCAM, GREM1, MLH1, MSH2, MSH3, MSH6, MUTYH, NBN, NF1, NTHL1, PALB2, PMS2, POLD1, POLE, PTEN, RAD51C, RAD51D, RECQL, SMAD4, SMARCA4, STK11, and TP53.      FAMILY HISTORY:  We obtained a detailed, 4-generation family history.  Significant diagnoses are listed below:      Family History  Problem Relation Age of Onset   Aortic aneurysm Mother     Clotting disorder Mother     Healthy Father     Heart disease Brother          stints   Breast cancer Maternal Aunt 51   Breast cancer Maternal Grandmother 69   Heart attack Maternal Grandmother          Ms. Cargile's maternal aunt was diagnosed with breast cancer at age 108 and died due to metastatic breast cancer. Her maternal grandmother was diagnosed with breast cancer at age 25, she is deceased. Ms. Schnarr is unaware of previous family history of genetic testing for hereditary cancer risks. There is no reported Ashkenazi Jewish ancestry.   GENETIC TEST RESULTS:  The [facility] CancerNext Panel found no pathogenic mutations.   The CancerNext gene panel offered by [employer] includes sequencing, rearrangement analysis, and RNA analysis for the following 36 genes:   APC, ATM, AXIN2, BARD1, BMPR1A, BRCA1, BRCA2, BRIP1, CDH1, CDK4, CDKN2A, CHEK2, DICER1, HOXB13, EPCAM, GREM1, MLH1, MSH2, MSH3, MSH6, MUTYH, NBN, NF1, NTHL1, PALB2, PMS2, POLD1, POLE, PTEN, RAD51C, RAD51D, RECQL, SMAD4, SMARCA4, STK11, and TP53.   The test report has been scanned into EPIC and is located under the Molecular Pathology section of the Results Review tab.  A portion of the result report is included below for reference. Genetic testing reported out on 09/11/2021.      Genetic testing identified a variant of uncertain significance (VUS) in the BARD1 gene called p.G256D.  At this time, it is unknown if this variant is associated with an increased risk for cancer or if it is benign, but most uncertain variants are reclassified to benign. It should not be used to make medical management decisions. With time, we suspect the laboratory will determine the significance of this variant, if any. If the laboratory reclassifies this variant, we will  attempt to contact Ms. Cieslewicz to discuss it further.   Even though a pathogenic variant was not identified, possible explanations for her personal history of cancer may include: There may be no hereditary risk for cancer in the family. The cancers in Ms. Argyle and/or her family may be due to other genetic or environmental factors. There may be a gene mutation in one of these genes that current  testing methods cannot detect, but that chance is small. There could be another gene that has not yet been discovered, or that we have not yet tested, that is responsible for the cancer diagnoses in the family.   Therefore, it is important to remain in touch with cancer genetics in the future so that we can continue to offer Ms. Giarratano the most up to date genetic testing.    ADDITIONAL GENETIC TESTING:  We discussed with Ms. Dowling that her genetic testing was fairly extensive.  If there are genes identified to increase cancer risk that can be analyzed in the future, we would be happy to discuss and coordinate this testing at that time.    CANCER SCREENING RECOMMENDATIONS:  Ms. Esteve test result is considered negative (normal).  This means that we have not identified a hereditary cause for her personal and family history of cancer at this time.   An individual's cancer risk and medical management are not determined by genetic test results alone. Overall cancer risk assessment incorporates additional factors, including personal medical history, family history, and any available genetic information that may result in a personalized plan for cancer prevention and surveillance. Therefore, it is recommended she continue to follow the cancer management and screening guidelines provided by her oncology and primary healthcare provider.  RECOMMENDATIONS FOR FAMILY MEMBERS:   Since she did not inherit a mutation in a cancer predisposition gene included on this panel, her children could not have inherited a mutation from her in one of these genes. Individuals in this family might be at some increased risk of developing cancer, over the general population risk, due to the family history of cancer. We recommend women in this family have a yearly mammogram beginning at age 36, or 66 years younger than the earliest onset of cancer, an annual clinical breast exam, and perform monthly breast self-exams.  We do not  recommend familial testing for the BARD1 variant of uncertain significance (VUS).  FOLLOW-UP:  Cancer genetics is a rapidly advancing field and it is possible that new genetic tests will be appropriate for her and/or her family members in the future. We encouraged her to remain in contact with cancer genetics on an annual basis so we can update her personal and family histories and let her know of advances in cancer genetics that may benefit this family.   Our contact number was provided. Ms. Marse questions were answered to her satisfaction, and she knows she is welcome to call us at anytime with additional questions or concerns.   Lucille Passy, MS, [facility] Genetic Counselor [email or web address] (P) [phone]

## 2021-09-23 ENCOUNTER — Encounter: Payer: Self-pay | Admitting: *Deleted

## 2021-10-17 DIAGNOSIS — Z91018 Allergy to other foods: Secondary | ICD-10-CM | POA: Diagnosis not present

## 2021-10-17 DIAGNOSIS — R1030 Lower abdominal pain, unspecified: Secondary | ICD-10-CM | POA: Diagnosis not present

## 2021-10-17 DIAGNOSIS — I350 Nonrheumatic aortic (valve) stenosis: Secondary | ICD-10-CM | POA: Diagnosis not present

## 2021-10-17 DIAGNOSIS — K5792 Diverticulitis of intestine, part unspecified, without perforation or abscess without bleeding: Secondary | ICD-10-CM | POA: Diagnosis not present

## 2021-10-17 DIAGNOSIS — I480 Paroxysmal atrial fibrillation: Secondary | ICD-10-CM | POA: Diagnosis present

## 2021-10-17 DIAGNOSIS — Z79899 Other long term (current) drug therapy: Secondary | ICD-10-CM | POA: Diagnosis not present

## 2021-10-17 DIAGNOSIS — Z853 Personal history of malignant neoplasm of breast: Secondary | ICD-10-CM | POA: Diagnosis not present

## 2021-10-17 DIAGNOSIS — K5732 Diverticulitis of large intestine without perforation or abscess without bleeding: Secondary | ICD-10-CM | POA: Diagnosis present

## 2021-10-17 DIAGNOSIS — E039 Hypothyroidism, unspecified: Secondary | ICD-10-CM | POA: Diagnosis not present

## 2021-10-17 DIAGNOSIS — Z7952 Long term (current) use of systemic steroids: Secondary | ICD-10-CM | POA: Diagnosis not present

## 2021-10-17 DIAGNOSIS — Z952 Presence of prosthetic heart valve: Secondary | ICD-10-CM | POA: Diagnosis not present

## 2021-10-17 DIAGNOSIS — R1084 Generalized abdominal pain: Secondary | ICD-10-CM | POA: Diagnosis not present

## 2021-10-17 DIAGNOSIS — R531 Weakness: Secondary | ICD-10-CM | POA: Diagnosis not present

## 2021-10-17 DIAGNOSIS — Z7901 Long term (current) use of anticoagulants: Secondary | ICD-10-CM | POA: Diagnosis not present

## 2021-10-17 DIAGNOSIS — I10 Essential (primary) hypertension: Secondary | ICD-10-CM | POA: Diagnosis present

## 2021-10-17 DIAGNOSIS — R52 Pain, unspecified: Secondary | ICD-10-CM | POA: Diagnosis not present

## 2021-10-17 DIAGNOSIS — R110 Nausea: Secondary | ICD-10-CM | POA: Diagnosis not present

## 2021-10-17 DIAGNOSIS — M353 Polymyalgia rheumatica: Secondary | ICD-10-CM | POA: Diagnosis present

## 2021-10-17 DIAGNOSIS — R112 Nausea with vomiting, unspecified: Secondary | ICD-10-CM | POA: Diagnosis not present

## 2021-10-18 DIAGNOSIS — R531 Weakness: Secondary | ICD-10-CM | POA: Diagnosis not present

## 2021-10-18 DIAGNOSIS — R110 Nausea: Secondary | ICD-10-CM | POA: Diagnosis not present

## 2021-10-18 DIAGNOSIS — R52 Pain, unspecified: Secondary | ICD-10-CM | POA: Diagnosis not present

## 2021-10-18 DIAGNOSIS — E039 Hypothyroidism, unspecified: Secondary | ICD-10-CM | POA: Diagnosis not present

## 2021-10-18 DIAGNOSIS — I480 Paroxysmal atrial fibrillation: Secondary | ICD-10-CM | POA: Diagnosis present

## 2021-10-18 DIAGNOSIS — R112 Nausea with vomiting, unspecified: Secondary | ICD-10-CM | POA: Diagnosis not present

## 2021-10-18 DIAGNOSIS — Z79899 Other long term (current) drug therapy: Secondary | ICD-10-CM | POA: Diagnosis not present

## 2021-10-18 DIAGNOSIS — K5732 Diverticulitis of large intestine without perforation or abscess without bleeding: Secondary | ICD-10-CM | POA: Insufficient documentation

## 2021-10-18 DIAGNOSIS — M353 Polymyalgia rheumatica: Secondary | ICD-10-CM | POA: Diagnosis present

## 2021-10-18 DIAGNOSIS — I350 Nonrheumatic aortic (valve) stenosis: Secondary | ICD-10-CM | POA: Diagnosis not present

## 2021-10-18 DIAGNOSIS — Z7901 Long term (current) use of anticoagulants: Secondary | ICD-10-CM | POA: Diagnosis not present

## 2021-10-18 DIAGNOSIS — Z952 Presence of prosthetic heart valve: Secondary | ICD-10-CM | POA: Diagnosis not present

## 2021-10-18 DIAGNOSIS — Z7952 Long term (current) use of systemic steroids: Secondary | ICD-10-CM | POA: Diagnosis not present

## 2021-10-18 DIAGNOSIS — Z91018 Allergy to other foods: Secondary | ICD-10-CM | POA: Diagnosis not present

## 2021-10-18 DIAGNOSIS — Z853 Personal history of malignant neoplasm of breast: Secondary | ICD-10-CM | POA: Diagnosis not present

## 2021-10-18 DIAGNOSIS — I10 Essential (primary) hypertension: Secondary | ICD-10-CM | POA: Diagnosis present

## 2021-10-18 DIAGNOSIS — R1084 Generalized abdominal pain: Secondary | ICD-10-CM | POA: Diagnosis not present

## 2021-10-22 ENCOUNTER — Other Ambulatory Visit (HOSPITAL_COMMUNITY): Payer: Self-pay | Admitting: General Surgery

## 2021-10-22 ENCOUNTER — Other Ambulatory Visit: Payer: Self-pay | Admitting: General Surgery

## 2021-10-22 DIAGNOSIS — C50512 Malignant neoplasm of lower-outer quadrant of left female breast: Secondary | ICD-10-CM | POA: Diagnosis not present

## 2021-10-22 DIAGNOSIS — Z170 Estrogen receptor positive status [ER+]: Secondary | ICD-10-CM | POA: Diagnosis not present

## 2021-10-22 DIAGNOSIS — K5732 Diverticulitis of large intestine without perforation or abscess without bleeding: Secondary | ICD-10-CM | POA: Diagnosis not present

## 2021-10-23 ENCOUNTER — Encounter: Payer: Self-pay | Admitting: *Deleted

## 2021-10-29 ENCOUNTER — Ambulatory Visit (INDEPENDENT_AMBULATORY_CARE_PROVIDER_SITE_OTHER): Payer: Medicare Other

## 2021-10-29 ENCOUNTER — Ambulatory Visit (HOSPITAL_COMMUNITY)
Admission: RE | Admit: 2021-10-29 | Discharge: 2021-10-29 | Disposition: A | Discharge status: Home / Self Care | Payer: Medicare Other | Source: Ambulatory Visit | Attending: General Surgery | Admitting: General Surgery

## 2021-10-29 VITALS — BP 130/78 | HR 64 | Temp 97.9°F | Resp 20 | Ht 65.0 in | Wt 172.4 lb

## 2021-10-29 DIAGNOSIS — Z171 Estrogen receptor negative status [ER-]: Secondary | ICD-10-CM | POA: Insufficient documentation

## 2021-10-29 DIAGNOSIS — C50512 Malignant neoplasm of lower-outer quadrant of left female breast: Secondary | ICD-10-CM | POA: Diagnosis not present

## 2021-10-29 DIAGNOSIS — M810 Age-related osteoporosis without current pathological fracture: Secondary | ICD-10-CM

## 2021-10-29 DIAGNOSIS — K5730 Diverticulosis of large intestine without perforation or abscess without bleeding: Secondary | ICD-10-CM | POA: Diagnosis not present

## 2021-10-29 DIAGNOSIS — K429 Umbilical hernia without obstruction or gangrene: Secondary | ICD-10-CM | POA: Diagnosis not present

## 2021-10-29 DIAGNOSIS — K449 Diaphragmatic hernia without obstruction or gangrene: Secondary | ICD-10-CM | POA: Diagnosis not present

## 2021-10-29 DIAGNOSIS — D259 Leiomyoma of uterus, unspecified: Secondary | ICD-10-CM | POA: Diagnosis not present

## 2021-10-29 MED ORDER — DENOSUMAB 60 MG/ML ~~LOC~~ SOSY
60.0000 mg | PREFILLED_SYRINGE | Freq: Once | SUBCUTANEOUS | Status: AC
  Administered 2021-10-29: 60 mg via SUBCUTANEOUS
  Filled 2021-10-29: qty 1

## 2021-10-29 NOTE — Progress Notes (Signed)
Diagnosis: Osteoporosis  Provider:  Praveen Mannam, MD  Procedure: Injection  Prolia (Denosumab), Dose: 60 mg, Site: subcutaneous, Number of injections: 1  Discharge: Condition: Good, Destination: Home . AVS provided to patient.   Performed by:  Orlando Thalmann, RN       

## 2021-10-30 ENCOUNTER — Other Ambulatory Visit: Payer: Self-pay | Admitting: General Surgery

## 2021-10-30 ENCOUNTER — Encounter: Payer: Self-pay | Admitting: *Deleted

## 2021-10-30 DIAGNOSIS — Z170 Estrogen receptor positive status [ER+]: Secondary | ICD-10-CM

## 2021-10-31 ENCOUNTER — Encounter: Payer: Self-pay | Admitting: *Deleted

## 2021-11-03 ENCOUNTER — Other Ambulatory Visit: Payer: Self-pay

## 2021-11-03 DIAGNOSIS — Z952 Presence of prosthetic heart valve: Secondary | ICD-10-CM

## 2021-11-03 DIAGNOSIS — R9389 Abnormal findings on diagnostic imaging of other specified body structures: Secondary | ICD-10-CM

## 2021-11-06 ENCOUNTER — Ambulatory Visit (HOSPITAL_COMMUNITY): Payer: Medicare Other

## 2021-11-07 ENCOUNTER — Other Ambulatory Visit: Payer: Self-pay

## 2021-11-07 ENCOUNTER — Ambulatory Visit (HOSPITAL_COMMUNITY)
Admission: RE | Admit: 2021-11-07 | Discharge: 2021-11-07 | Disposition: A | Discharge status: Home / Self Care | Payer: Medicare Other | Source: Ambulatory Visit | Attending: Cardiovascular Disease | Admitting: Cardiovascular Disease

## 2021-11-07 ENCOUNTER — Telehealth: Payer: Self-pay | Admitting: Hematology and Oncology

## 2021-11-07 ENCOUNTER — Other Ambulatory Visit: Payer: Self-pay | Admitting: Cardiology

## 2021-11-07 ENCOUNTER — Telehealth: Payer: Self-pay | Admitting: *Deleted

## 2021-11-07 DIAGNOSIS — I71019 Dissection of thoracic aorta, unspecified: Secondary | ICD-10-CM | POA: Diagnosis not present

## 2021-11-07 DIAGNOSIS — Z952 Presence of prosthetic heart valve: Secondary | ICD-10-CM

## 2021-11-07 DIAGNOSIS — I7770 Dissection of unspecified artery: Secondary | ICD-10-CM

## 2021-11-07 DIAGNOSIS — I700 Atherosclerosis of aorta: Secondary | ICD-10-CM | POA: Diagnosis not present

## 2021-11-07 DIAGNOSIS — I701 Atherosclerosis of renal artery: Secondary | ICD-10-CM | POA: Diagnosis not present

## 2021-11-07 DIAGNOSIS — I2510 Atherosclerotic heart disease of native coronary artery without angina pectoris: Secondary | ICD-10-CM | POA: Diagnosis not present

## 2021-11-07 DIAGNOSIS — R9389 Abnormal findings on diagnostic imaging of other specified body structures: Secondary | ICD-10-CM | POA: Insufficient documentation

## 2021-11-07 DIAGNOSIS — K5790 Diverticulosis of intestine, part unspecified, without perforation or abscess without bleeding: Secondary | ICD-10-CM | POA: Diagnosis not present

## 2021-11-07 DIAGNOSIS — I7102 Dissection of abdominal aorta: Secondary | ICD-10-CM | POA: Diagnosis not present

## 2021-11-07 DIAGNOSIS — D259 Leiomyoma of uterus, unspecified: Secondary | ICD-10-CM | POA: Diagnosis not present

## 2021-11-07 MED ORDER — IOHEXOL 350 MG/ML SOLN
80.0000 mL | Freq: Once | INTRAVENOUS | Status: AC | PRN
  Administered 2021-11-07: 80 mL via INTRAVENOUS

## 2021-11-07 MED ORDER — IOHEXOL 350 MG/ML SOLN
100.0000 mL | Freq: Once | INTRAVENOUS | Status: AC | PRN
  Administered 2021-11-07: 100 mL via INTRAVENOUS

## 2021-11-07 NOTE — Telephone Encounter (Signed)
.  Called patient to schedule appointment per 7/28 inbasket, patient is aware of date and time.   

## 2021-11-07 NOTE — Telephone Encounter (Signed)
Received DOD call from Dr Georgetta Haber - Radiology requesting order for CTA abdomen/pelvis to evaluate dissection.   Per Dr Marlou Porch - please order

## 2021-11-11 DIAGNOSIS — C50919 Malignant neoplasm of unspecified site of unspecified female breast: Secondary | ICD-10-CM

## 2021-11-11 HISTORY — DX: Malignant neoplasm of unspecified site of unspecified female breast: C50.919

## 2021-11-13 ENCOUNTER — Telehealth: Payer: Self-pay | Admitting: Interventional Cardiology

## 2021-11-13 DIAGNOSIS — I4891 Unspecified atrial fibrillation: Secondary | ICD-10-CM | POA: Diagnosis not present

## 2021-11-13 DIAGNOSIS — E079 Disorder of thyroid, unspecified: Secondary | ICD-10-CM | POA: Diagnosis not present

## 2021-11-13 DIAGNOSIS — R000 Tachycardia, unspecified: Secondary | ICD-10-CM | POA: Diagnosis not present

## 2021-11-13 DIAGNOSIS — R011 Cardiac murmur, unspecified: Secondary | ICD-10-CM | POA: Diagnosis not present

## 2021-11-13 DIAGNOSIS — I10 Essential (primary) hypertension: Secondary | ICD-10-CM | POA: Diagnosis not present

## 2021-11-13 DIAGNOSIS — Z952 Presence of prosthetic heart valve: Secondary | ICD-10-CM | POA: Diagnosis not present

## 2021-11-13 DIAGNOSIS — Z7901 Long term (current) use of anticoagulants: Secondary | ICD-10-CM | POA: Diagnosis not present

## 2021-11-13 MED ORDER — AMIODARONE HCL 200 MG PO TABS: 200.0000 mg | ORAL_TABLET | Freq: Two times a day (BID) | ORAL | Status: DC

## 2021-11-13 NOTE — Addendum Note (Signed)
Addended by: Molli Barrows on: 11/13/2021 12:09 PM   Modules accepted: Orders

## 2021-11-13 NOTE — Telephone Encounter (Signed)
Patient c/o Palpitations:  High priority if patient c/o lightheadedness, shortness of breath, or chest pain  How long have you had palpitations/irregular HR/ Afib? Are you having the symptoms now?  Since last night  Are you currently experiencing lightheadedness, SOB or CP?  Lightheadedness  Do you have a history of afib (atrial fibrillation) or irregular heart rhythm?   Yes  Have you checked your BP or HR? (document readings if available):  Yes   Are you experiencing any other symptoms?    Patient stated she has taken 2 of the metoprolol succinate (TOPROL XL) 25 MG 24 hr tablet 7pm and 10pm last night.  Patient stated she would like advice on next steps.

## 2021-11-13 NOTE — Telephone Encounter (Signed)
Returned call to patient and discussed symptoms.  Patient states "I am in a-fib, I've been in a-fib since last night." Patient reports feeling dizzy and weak, having to stop to catch her breath when walking her dog.  Patient states she took an extra dose of Toprol XL '25mg'$  last night around 10pm in addition to her regular dose at 7pm.   Patient reports HR last night 120. BP this morning 106/84 with HR 88.  Patient states she is in [place] currently and not sure what to do and is concerned she is in a-fib.   Discussed with Dr. Tamala Julian who advised patient go to local Urgent Care to have an EKG performed to determine if she actually is in a-fib to help guide symptom management options.

## 2021-11-13 NOTE — Telephone Encounter (Signed)
Received incoming call from Dr. Buelah Manis at [facility] in [place], who is seeing patient in ED today.  Dr. Buelah Manis spoke with Dr. Tamala Julian, per Dr. Tamala Julian: Start Amiodarone '200mg'$  BID x1 week then decrease dose to '200mg'$  QD.   OV F/U (with EKG) in 2 weeks.

## 2021-11-17 ENCOUNTER — Telehealth: Payer: Self-pay

## 2021-11-17 NOTE — Telephone Encounter (Signed)
Left message for patient to return call to schedule F/U appt for A-fib with one of our APPs next week (8/17 or 8/18). Provided office number for callback.

## 2021-11-24 ENCOUNTER — Encounter: Payer: Self-pay | Admitting: Surgery

## 2021-11-24 ENCOUNTER — Ambulatory Visit (INDEPENDENT_AMBULATORY_CARE_PROVIDER_SITE_OTHER): Payer: Medicare Other | Admitting: Surgery

## 2021-11-24 VITALS — BP 125/80 | HR 60 | Temp 98.0°F | Resp 20 | Ht 65.0 in | Wt 173.4 lb

## 2021-11-24 DIAGNOSIS — I25118 Atherosclerotic heart disease of native coronary artery with other forms of angina pectoris: Secondary | ICD-10-CM

## 2021-11-24 DIAGNOSIS — I71012 Dissection of descending thoracic aorta: Secondary | ICD-10-CM | POA: Diagnosis not present

## 2021-11-24 NOTE — Progress Notes (Signed)
>                                   [facility]  Patient name: Marissa Powers MRN: [id number] DOB: 07/02/1940 Sex: female   REQUESTING PROVIDER:    Dr. Julianne Handler   REASON FOR CONSULT:    Aortic dissection  HISTORY OF PRESENT ILLNESS:   Marissa Powers is a 81 y.o. 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  The patient underwent TAVR on 08/19/2021 which was uncomplicated.  [place] CT scan for diverticulitis that showed a dissection.  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  She did have 1 episode of back pain, however this was not felt to be associated with her dissection.  She currently is without chest or back pain.  She does not have any trouble eating or walking.  Her blood pressure has been well controlled.  She is on Eliquis for atrial fibrillation.  She is scheduled to undergo mastectomy in the near future.  She is medically managed for hypertension.  She is a non-smoker.  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 Angelena Form & Dr. Cyndia Bent .   Severe aortic stenosis      FAMILY HISTORY   Family History  Problem Relation Age of Onset   Aortic aneurysm Mother    Clotting disorder Mother    Healthy Father    Heart disease Brother         stints   Breast cancer Maternal Aunt 59   Breast cancer Maternal Grandmother 69   Heart attack Maternal Grandmother     SOCIAL HISTORY:   Social History   Socioeconomic History   Marital status: Widowed    Spouse name: Not on file   Number of children: 2   Years of education: Not on file   Highest education level: Not on file  Occupational History   Not on file  Tobacco Use   Smoking status: Never    Passive exposure: Never   Smokeless tobacco: Never  Vaping Use   Vaping Use: Never used  Substance and Sexual Activity   Alcohol use: Yes    Alcohol/week: 4.0 - 6.0 standard drinks of alcohol    Types: 4 - 6 Standard drinks or equivalent per week   Drug use: No   Sexual activity: Not Currently    Partners: Male    Birth control/protection: Post-menopausal  Other Topics Concern   Not on file  Social History Narrative   Not on file   Social Determinants of Health  Financial Resource Strain: Low Risk  (08/05/2021)   Overall Financial Resource Strain (CARDIA)    Difficulty of Paying Living Expenses: Not hard at all  Food Insecurity: No Food Insecurity (08/05/2021)   Hunger Vital Sign    Worried About Running Out of Food in the Last Year: Never true    Ran Out of Food in the Last Year: Never true  Transportation Needs: No Transportation Needs (08/05/2021)   PRAPARE - Hydrologist (Medical): No    Lack of Transportation (Non-Medical): No  Physical Activity: Not on file  Stress: Not on file  Social Connections: Not on file  Intimate Partner Violence: Not on file    ALLERGIES:    Allergies  Allergen Reactions   Cashew Nut Oil Anaphylaxis, Swelling and Other (See Comments)    Tingling/facial swelling     CURRENT MEDICATIONS:    Current Outpatient Medications  Medication Sig Dispense Refill   acetaminophen (TYLENOL) 500 MG tablet Take 1,000 mg by mouth every 6 (six) hours as needed (pain.).     amiodarone (PACERONE) 200 MG  tablet Take 1 tablet (200 mg total) by mouth 2 (two) times daily. Take twice daily for 1 week, then decrease to 1 tablet by mouth once daily.     amoxicillin (AMOXIL) 500 MG capsule Take 4 capsules (2,000 mg total) by mouth See admin instructions. Take 4 capsules (2000 mg) by mouth 1 hour prior to dental appointments 12 capsule 12   apixaban (ELIQUIS) 5 MG TABS tablet Take 1 tablet (5 mg total) by mouth 2 (two) times daily. Resume Apixaban at 8 PM 06/19/2921 60 tablet 11   B-D TB SYRINGE 1CC/27GX1/2" 27G X 1/2" 1 ML MISC Inject into the skin once a week.     Calcium Carb-Cholecalciferol (CALCIUM + D3 PO) Take 1 tablet by mouth in the morning.     cholecalciferol (VITAMIN D3) 25 MCG (1000 UNIT) tablet Take 1,000 Units by mouth in the morning.     Cyanocobalamin (B-12 PO) Take 1,000 mcg by mouth in the morning.     denosumab (PROLIA) 60 MG/ML SOSY injection Inject 60 mg into the skin every 6 (six) months.     folic acid (FOLVITE) 1 MG tablet Take 2 mg by mouth in the morning.     letrozole (FEMARA) 2.5 MG tablet Take 1 tablet (2.5 mg total) by mouth daily. 90 tablet 3   levothyroxine (SYNTHROID, LEVOTHROID) 88 MCG tablet Take 88 mcg by mouth daily before breakfast.     loperamide (IMODIUM) 2 MG capsule Take 2-4 mg by mouth 4 (four) times daily as needed (upset stomach/diarrhea/loose stools).     methotrexate 50 MG/2ML injection Inject 25 mg into the skin every Wednesday.     metoprolol succinate (TOPROL XL) 25 MG 24 hr tablet Take 1 tablet (25 mg total) by mouth daily. 90 tablet 3   mometasone (ELOCON) 0.1 % ointment Apply topically to vulvar skin no more than twice weekly 45 g 1   nitroGLYCERIN (NITROSTAT) 0.4 MG SL tablet Place 1 tablet (0.4 mg total) under the tongue every 5 (five) minutes as needed for chest pain. 25 tablet 3   nystatin cream (MYCOSTATIN) Apply 1 application. topically 2 (two) times daily. Apply to affected area BID for up to 7 days to skin under breasts 30 g 1   pantoprazole  (PROTONIX) 40 MG tablet Take 40 mg by mouth 2 (two) times daily as needed.     predniSONE (DELTASONE) 1 MG  tablet Take 1 mg by mouth daily with breakfast. continuous     REPATHA SURECLICK 564 MG/ML SOAJ Inject 140 mg into the skin every 14 (fourteen) days.     telmisartan (MICARDIS) 20 MG tablet Take 20 mg by mouth every evening.     zolpidem (AMBIEN) 5 MG tablet Take 2.5 mg by mouth at bedtime.     No current facility-administered medications for this visit.    REVIEW OF SYSTEMS:   '[X]'$  denotes positive finding, '[ ]'$  denotes negative finding Cardiac  Comments:  Chest pain or chest pressure:    Shortness of breath upon exertion:    Short of breath when lying flat:    Irregular heart rhythm:        Vascular    Pain in calf, thigh, or hip brought on by ambulation:    Pain in feet at night that wakes you up from your sleep:     Blood clot in your veins:    Leg swelling:         Pulmonary    Oxygen at home:    Productive cough:     Wheezing:         Neurologic    Sudden weakness in arms or legs:     Sudden numbness in arms or legs:     Sudden onset of difficulty speaking or slurred speech:    Temporary loss of vision in one eye:     Problems with dizziness:         Gastrointestinal    Blood in stool:      Vomited blood:         Genitourinary    Burning when urinating:     Blood in urine:        Psychiatric    Major depression:         Hematologic    Bleeding problems:    Problems with blood clotting too easily:        Skin    Rashes or ulcers:        Constitutional    Fever or chills:     PHYSICAL EXAM:   There were no vitals filed for this visit.  GENERAL: The patient is a well-nourished female, in no acute distress. The vital signs are documented above. CARDIAC: There is a regular rate and rhythm.  VASCULAR: Extremities are warm and well-perfused PULMONARY: Nonlabored respirations ABDOMEN: Soft and non-tender  MUSCULOSKELETAL: There are no major  deformities or cyanosis. NEUROLOGIC: No focal weakness or paresthesias are detected. SKIN: There are no ulcers or rashes noted. PSYCHIATRIC: The patient has a normal affect.  STUDIES:   I have reviewed her CTA with the following results: CTA Chest: 1. 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 Aorta is unchanged in caliber when compared with prior CT of the abdomen and pelvis dated October 29, 2021. Additional deflection flap is seen involving the partially visualized abdominal aorta, recommend CTA of the abdomen and pelvis for complete evaluation of distal extent. 2. Aortic Atherosclerosis (ICD10-I70.0).  CTA ABD PELVIS: VASCULAR   1. Small focal dissection of the abdominal aorta which begins at the level of the SMA and terminates above the bilateral renal arteries. 2. See separately dictated same day CT of the chest for discussion of findings above the diaphragm.   NON-VASCULAR   1. Mild soft tissue stranding is seen about the sigmoid colon, decreased when compared with prior CT of the abdomen and pelvis, compatible with resolving acute  diverticulitis.   ASSESSMENT and PLAN   Aortic dissection: There is no evidence of malperfusion.  She is without chest or back pain.  I discussed that this will be managed nonoperatively, as long as there is not a significant change in the diameter of her aorta.  I plan on having her get a repeat CT scan in 6 months.  She knows to contact me if she develops severe chest or back pain.   Leia Alf, MD, FACS [facility] [phone] Pager [phone]

## 2021-12-02 NOTE — Progress Notes (Unsigned)
Cardiology Office Note:    Date:  12/04/2021   ID:  Marissa Powers, DOB 04-07-1941, MRN [id number]  PCP:  Crist Infante, MD   [facility] Providers Cardiologist:  Sinclair Grooms, MD Electrophysiologist:  Thompson Grayer, MD     Referring MD: Crist Infante, MD   Chief Complaint: atrial fibrillation  History of Present Illness:    Marissa Powers is a very pleasant 81 y.o. 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   Had DTOIZ-12 infection complicated by PAF requiring amiodarone.  She was not aware that she was in atrial fibrillation.  Decision was made to leave her on amiodarone until she gets through TAVR.  Pre-TAVR cath 06/19/2021 showed widely patent left main, LAD, circumflex and 70 to 80% stenosis of mid RCA for which medical therapy was recommended.   At office visit with Dr. Tamala Julian on 07/16/2021 she had had recent mammography with left breast lump and lymph node identified was diagnosed with breast cancer estrogen receptor positive, invasive lobular cancer and 1 large lymph node which on biopsy was positive for breast cancer.  Seen by Dr. Payton Mccallum and felt would require a mastectomy and radiation therapy.  She was started on neoadjuvant antiestrogen therapy until surgery. She underwent TAVR with Edwards SAPIEN size 23 mm THV via TF approach on 08/20/2021.  She had new persistent LBBB for which cardiac monitor was placed.  Was last seen in our office on 09/17/2021 by Nell Range, PA.  Cardiac monitor placed following TAVR revealed underlying rhythm NSR and sinus bradycardia.  Average HR 60 bpm, no SR rate greater than 100 bpm.  No A-fib or HAVB.  Dr. Tamala Julian, chronotropic incompetence not excluded by this study.  Amiodarone was discontinued at her postop visit.  He reported dizziness from time to time and unsteady gait.  Still gets short of breath but  overall feels she is doing okay. BP was elevated but better at home per her report.  Our office received a call on 11/13/2021 from patient who reported A-fib since the night previous, feeling dizzy and weak, having to stop to catch her breath when walking her dog.  She had taken an extra dose of Toprol XL 25 mg around 10 PM in addition to her regular dose at 7 PM.  HR at the night previous was 120 bpm.  BP on the date of the call 106/84, HR 88.  She was in [place] and was advised to go to local urgent care EKG.  Dr. Tamala Julian received a call from Dr. Buelah Manis at [facility] regional in [place] and was advised for patient to start amiodarone 200 mg twice daily x1 week then decrease dose to 200 mg daily and come in for a follow-up office visit.  Today, she is here alone for follow-up. Reports she continues to have occasional dizziness, weakness, and shortness of breath. She is in NSR today.  Atrial fibrillation makes her feel a nervousness inside typically, however on the occasion of 8/2 she did not have any symptoms. On chemotherapy for breast cancer and has mastectomy pending.  She reports SOB with walking up a slight incline.  Is aware that her exercise tolerance has greatly decreased over the past few year.  She denies chest pain, orthopnea, PND, edema, presyncope, or syncope  Past Medical History:  Diagnosis Date   Arthritis    osteoarthritis  CAD (coronary artery disease)    Dysplasia of cervix, low grade (CIN 1) 1990   HPV, Cryo, Laser   Fibroadenoma    Left, at 5 o'clock, not excised    Fibroid 2002   1 cm, 2 cm   GERD (gastroesophageal reflux disease)    Hypercholesteremia    Hypertension    Hyperthyroidism    PAF (paroxysmal atrial fibrillation) (HCC)    on Eliquis   PMR (polymyalgia rheumatica) (HCC)    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   s/p TAVR with a 23 mm Edwards S3UR via the TF approach by Dr. Angelena Form & Dr. Cyndia Bent .   Severe aortic stenosis      Past Surgical History:  Procedure Laterality Date   BREAST BIOPSY Bilateral 1970's   x2 one a side   CATARACT EXTRACTION     bilateral   implantable loop recorder removal  05/30/2021   MDT reveal LINQ removed by Dr Rayann Heman   INTRAOPERATIVE TRANSTHORACIC ECHOCARDIOGRAM N/A 08/19/2021   Procedure: INTRAOPERATIVE TRANSTHORACIC ECHOCARDIOGRAM;  Surgeon: Burnell Blanks, MD;  Location: [facility];  Service: Open Heart Surgery;  Laterality: N/A;   LOOP RECORDER INSERTION N/A 08/11/2017   Procedure: LOOP RECORDER INSERTION;  Surgeon: Thompson Grayer, MD;  Location: [facility];  Service: Cardiovascular;  Laterality: N/A;   RETINAL DETACHMENT SURGERY  2000   RIGHT/LEFT HEART CATH AND CORONARY ANGIOGRAPHY N/A 06/19/2021   Procedure: RIGHT/LEFT HEART CATH AND CORONARY ANGIOGRAPHY;  Surgeon: Belva Crome, MD;  Location: [facility];  Service: Cardiovascular;  Laterality: N/A;   THYROIDECTOMY  1967   TONSILLECTOMY     TOTAL KNEE ARTHROPLASTY  04/25/2012   Procedure: TOTAL KNEE BILATERAL;  Surgeon: Gearlean Alf, MD;  Location: [facility];  Service: Orthopedics;  Laterality: Bilateral;   TRANSCATHETER AORTIC VALVE REPLACEMENT, TRANSFEMORAL N/A 08/19/2021   Procedure: Transcatheter Aortic Valve Replacement, Transfemoral Using 62m Sapien 3 Ultra Edwards Valve;  Surgeon: MBurnell Blanks MD;  [place] [facility]  Service: Open Heart Surgery;  Laterality: N/A;    Current Medications: Current Meds  Medication Sig   acetaminophen (TYLENOL) 500 MG tablet Take 1,000 mg by mouth every 6 (six) hours as needed (pain.).   amoxicillin (AMOXIL) 500 MG capsule Take 4 capsules (2,000 mg total) by mouth See admin instructions. Take 4 capsules (2000 mg) by mouth 1 hour prior to dental appointments   apixaban (ELIQUIS) 5 MG TABS tablet Take 1 tablet (5 mg total) by mouth 2 (two) times daily. Resume Apixaban at 8 PM 06/19/2921   B-D TB SYRINGE 1CC/27GX1/2" 27G X 1/2" 1 ML MISC Inject into the skin  once a week.   Calcium Carb-Cholecalciferol (CALCIUM + D3 PO) Take 1 tablet by mouth in the morning.   cholecalciferol (VITAMIN D3) 25 MCG (1000 UNIT) tablet Take 1,000 Units by mouth in the morning.   Cyanocobalamin (B-12 PO) Take 1,000 mcg by mouth in the morning.   denosumab (PROLIA) 60 MG/ML SOSY injection Inject 60 mg into the skin every 6 (six) months.   folic acid (FOLVITE) 1 MG tablet Take 2 mg by mouth in the morning.   letrozole (FEMARA) 2.5 MG tablet Take 1 tablet (2.5 mg total) by mouth daily.   levothyroxine (SYNTHROID, LEVOTHROID) 88 MCG tablet Take 88 mcg by mouth daily before breakfast.   loperamide (IMODIUM) 2 MG capsule Take 2-4 mg by mouth 4 (four) times daily as needed (upset stomach/diarrhea/loose stools).   methotrexate 50 MG/2ML injection Inject 25  mg into the skin every Wednesday.   metoprolol succinate (TOPROL XL) 25 MG 24 hr tablet Take 1 tablet (25 mg total) by mouth daily.   mometasone (ELOCON) 0.1 % ointment Apply topically to vulvar skin no more than twice weekly   nitroGLYCERIN (NITROSTAT) 0.4 MG SL tablet Place 1 tablet (0.4 mg total) under the tongue every 5 (five) minutes as needed for chest pain.   nystatin cream (MYCOSTATIN) Apply 1 application. topically 2 (two) times daily. Apply to affected area BID for up to 7 days to skin under breasts   predniSONE (DELTASONE) 1 MG tablet Take 1 mg by mouth daily with breakfast. continuous   REPATHA SURECLICK 644 MG/ML SOAJ Inject 140 mg into the skin every 14 (fourteen) days.   telmisartan (MICARDIS) 20 MG tablet Take 20 mg by mouth every evening.   zolpidem (AMBIEN) 5 MG tablet Take 2.5 mg by mouth at bedtime.   [DISCONTINUED] amiodarone (PACERONE) 200 MG tablet Take 1 tablet (200 mg total) by mouth 2 (two) times daily. Take twice daily for 1 week, then decrease to 1 tablet by mouth once daily.     Allergies:   Cashew nut oil   Social History   Socioeconomic History   Marital status: Widowed    Spouse name: Not on  file   Number of children: 2   Years of education: Not on file   Highest education level: Not on file  Occupational History   Not on file  Tobacco Use   Smoking status: Never    Passive exposure: Never   Smokeless tobacco: Never  Vaping Use   Vaping Use: Never used  Substance and Sexual Activity   Alcohol use: Yes    Alcohol/week: 4.0 - 6.0 standard drinks of alcohol    Types: 4 - 6 Standard drinks or equivalent per week   Drug use: No   Sexual activity: Not Currently    Partners: Male    Birth control/protection: Post-menopausal  Other Topics Concern   Not on file  Social History Narrative   Not on file   Social Determinants of Health   Financial Resource Strain: Low Risk  (08/05/2021)   Overall Financial Resource Strain (CARDIA)    Difficulty of Paying Living Expenses: Not hard at all  Food Insecurity: No Food Insecurity (08/05/2021)   Hunger Vital Sign    Worried About Running Out of Food in the Last Year: Never true    Ran Out of Food in the Last Year: Never true  Transportation Needs: No Transportation Needs (08/05/2021)   PRAPARE - Hydrologist (Medical): No    Lack of Transportation (Non-Medical): No  Physical Activity: Not on file  Stress: Not on file  Social Connections: Not on file     Family History: The patient's family history includes Aortic aneurysm in her mother; Breast cancer (age of onset: 16) in her maternal aunt; Breast cancer (age of onset: 70) in her maternal grandmother; Clotting disorder in her mother; Healthy in her father; Heart attack in her maternal grandmother; Heart disease in her brother.  ROS:   Please see the history of present illness.    + DOE + fatigue All other systems reviewed and are negative.  Labs/Other Studies Reviewed:    The following studies were reviewed today:  Cardiac monitor 09/10/21 Basic underlying rhythm is NSR and sinus bradycardia. Average HR 60 bpm. No sinus rhythm rate > 100  bpm Rare SVT, longest 11 beats at 94  bpm. No atrial fibrillation. No sustained bradycardia or pauses. No AV block Chronotropic incompetence is not excluded by this study  Echo 09/17/21   1. The aortic valve has been repaired/replaced. Aortic valve  regurgitation is not visualized. There is a 23 mm Sapien prosthetic (TAVR)  valve present in the aortic position. Procedure Date: 08/19/2021. Echo  findings are consistent with normal structure  and function of the aortic valve prosthesis. Aortic valve area, by VTI  measures 1.75 cm. Aortic valve mean gradient measures 9.8 mmHg. Aortic  valve Vmax measures 2.08 m/s.   2. Left ventricular ejection fraction, by estimation, is 55 to 60%. The  left ventricle has normal function. The left ventricle has no regional  wall motion abnormalities. Left ventricular diastolic parameters are  consistent with Grade II diastolic  dysfunction (pseudonormalization).   3. Right ventricular systolic function is normal. The right ventricular  size is normal.   4. The mitral valve is grossly normal. Trivial mitral valve  regurgitation. No evidence of mitral stenosis.   5. The inferior vena cava is normal in size with greater than 50%  respiratory variability, suggesting right atrial pressure of 3 mmHg.   Comparison(s): No significant change from prior study. Normal prosthesis.   R/LHC 06/19/21  CONCLUSIONS: Severe aortic stenosis with mean transvalvular gradient 50.3 mmHg, and calculated aortic valve area 0.89 cm. Widely patent left main, LAD, circumflex, and right coronary.  First diagonal contains 70 to 80% mid RCA. Mild pulmonary hypertension, mean PA pressure 24 mmHg.  Mean wedge pressure 14 mmHg (likely inaccurate) LVEDP 21 mmHg.  WHO group 2. PA arterial saturation 75%.  Aortic arterial saturation 99%.  Cardiac output 6.63 L/min.   RECOMMENDATIONS:   Severe aortic stenosis and will be referred to Dr. Angelena Form in the structural heart clinic to begin  work-up for TAVR. Risk factor modification for minor coronary atherosclerosis as has been occurring over the past 3 years.    Recent Labs: 08/15/2021: ALT 14 08/20/2021: BUN 6; Creatinine, Ser 0.88; Hemoglobin 9.3; Magnesium 2.0; Platelets 149; Potassium 3.8; Sodium 139  Recent Lipid Panel    Component Value Date/Time   CHOL 122 10/28/2018 0832   TRIG 100 07/11/2021 0945   HDL 55 10/28/2018 0832   CHOLHDL 2.2 10/28/2018 0832   LDLCALC 45 10/28/2018 0832     Risk Assessment/Calculations:    CHA2DS2-VASc Score = 5   This indicates a 7.2% annual risk of stroke. The patient's score is based upon: CHF History: 0 HTN History: 1 Diabetes History: 0 Stroke History: 0 Vascular Disease History: 1 Age Score: 2 Gender Score: 1    Physical Exam:    VS:  BP 120/80 (BP Location: Left Arm, Patient Position: Sitting, Cuff Size: Normal)   Pulse 61   Ht '5\' 5"'$  (1.651 m)   Wt 174 lb (78.9 kg)   SpO2 96%   BMI 28.96 kg/m     Wt Readings from Last 3 Encounters:  12/03/21 174 lb (78.9 kg)  11/24/21 173 lb 6.4 oz (78.7 kg)  10/29/21 172 lb 6.4 oz (78.2 kg)     GEN:  Well nourished, well developed in no acute distress HEENT: Normal NECK: No JVD; No carotid bruits CARDIAC: RRR, no murmurs, rubs, gallops RESPIRATORY:  Clear to auscultation without rales, wheezing or rhonchi  ABDOMEN: Soft, non-tender, non-distended MUSCULOSKELETAL:  No edema; No deformity. 2+ pedal pulses, equal bilaterally SKIN: Warm and dry NEUROLOGIC:  Alert and oriented x 3 PSYCHIATRIC:  Normal affect   EKG:  EKG  is ordered today.  The ekg ordered today demonstrates NSR at 61 bpm, nonspecific ST abnormality, no acute change from previous  Diagnoses:    1. Aortic stenosis, severe   2. Paroxysmal atrial fibrillation (HCC)   3. Coronary artery disease involving native coronary artery of native heart without angina pectoris   4. Primary hypertension   5. S/P TAVR (transcatheter aortic valve replacement)   6. DOE  (dyspnea on exertion)   7. Chronic anticoagulation    Assessment and Plan:     Dyspnea on exertion: Likely multifactorial with age, deconditioning, A-fib, history of aortic stenosis with TAVR, and chemotherapy. Encouraged her to continue to walk daily. Appeared visibly relieved following our discussion of all factors that may be contributing.  We will plan for close follow-up following her mastectomy.  Advised her to contact us with concerns prior to that time.  A fib on chronic anticoagulation: NSR at 61 bpm on EKG today.  No return of A-fib since 8/3 to her awareness.  Reports typical symptom of feeling nervous on the inside did not occur with most recent episode.  Lengthy discussion about upcoming mastectomy for breast cancer and current treatment which is stressful.  I recommended that she remain on amiodarone 200 mg daily until the postoperative period at which time we can reevaluate. Eliquis dose is appropriate for age/weight/creatinine, no bleeding concerns. Continue metoprolol, amiodarone, Eliquis.  AS s/p TAVR: Stable TAVR valve with a mean gradient of 9.8 mmHg and no paravalvular leak, EF is 55% on 1 month post TAVR echo.  Plan to repeat in 1 year.   Hypertension: BP is well controlled today. Reports home SBP generally 135 or lower, typically 120s. No medication changes today.   CAD without angina: Widely patent LM, LAD, circumflex, and right coronary.  First diagonal vessel contains 70 to 80% stenosis.  Continue risk factor modification.  We reviewed these results together and I encouraged her to continue to follow heart healthy diet, regular exercise, and compliance with medications. LDL has been well-controlled on Repatha.      Disposition: 2 months with Dr. Tamala Julian  Medication Adjustments/Labs and Tests Ordered: Current medicines are reviewed at length with the patient today.  Concerns regarding medicines are outlined above.  Orders Placed This Encounter  Procedures   EKG 12-Lead    Meds ordered this encounter  Medications   amiodarone (PACERONE) 200 MG tablet    Sig: Take 1 tablet (200 mg total) by mouth daily.    Dispense:  90 tablet    Refill:  3    Patient Instructions  Medication Instructions:   Your physician recommends that you continue on your current medications as directed. Please refer to the Current Medication list given to you today.   *If you need a refill on your cardiac medications before your next appointment, please call your pharmacy*   Lab Work:  None ordered.  If you have labs (blood work) drawn today and your tests are completely normal, you will receive your results only by: [place] (if you have MyChart) OR A paper copy in the mail If you have any lab test that is abnormal or we need to change your treatment, we will call you to review the results.   Testing/Procedures:  None ordered.   Follow-Up: At [facility], you and your health needs are our priority.  As part of our continuing mission to provide you with exceptional heart care, we have created designated Provider Care Teams.  These Care Teams include  your primary Cardiologist (physician) and Advanced Practice Providers (APPs -  Physician Assistants and Nurse Practitioners) who all work together to provide you with the care you need, when you need it.  We recommend signing up for the patient portal called "MyChart".  Sign up information is provided on this After Visit Summary.  MyChart is used to connect with patients for Virtual Visits (Telemedicine).  Patients are able to view lab/test results, encounter notes, upcoming appointments, etc.  Non-urgent messages can be sent to your provider as well.   To learn more about what you can do with MyChart, go to [email or web address].    Your next appointment:   3 month(s)  The format for your next appointment:   In Person  Provider:   Sinclair Grooms, MD    Important Information About Sugar          Signed, Emmaline Life, NP  12/04/2021 9:29 AM    [place]

## 2021-12-03 ENCOUNTER — Ambulatory Visit (INDEPENDENT_AMBULATORY_CARE_PROVIDER_SITE_OTHER): Payer: Medicare Other | Admitting: Nurse Practitioner

## 2021-12-03 ENCOUNTER — Ambulatory Visit (HOSPITAL_COMMUNITY)
Admission: RE | Admit: 2021-12-03 | Discharge: 2021-12-03 | Disposition: A | Discharge status: Home / Self Care | Payer: Medicare Other | Source: Ambulatory Visit | Attending: General Surgery | Admitting: General Surgery

## 2021-12-03 ENCOUNTER — Encounter: Payer: Self-pay | Admitting: Nurse Practitioner

## 2021-12-03 VITALS — BP 120/80 | HR 61 | Ht 65.0 in | Wt 174.0 lb

## 2021-12-03 DIAGNOSIS — R928 Other abnormal and inconclusive findings on diagnostic imaging of breast: Secondary | ICD-10-CM | POA: Diagnosis not present

## 2021-12-03 DIAGNOSIS — I10 Essential (primary) hypertension: Secondary | ICD-10-CM | POA: Diagnosis not present

## 2021-12-03 DIAGNOSIS — R0609 Other forms of dyspnea: Secondary | ICD-10-CM | POA: Diagnosis not present

## 2021-12-03 DIAGNOSIS — Z171 Estrogen receptor negative status [ER-]: Secondary | ICD-10-CM | POA: Diagnosis not present

## 2021-12-03 DIAGNOSIS — I2510 Atherosclerotic heart disease of native coronary artery without angina pectoris: Secondary | ICD-10-CM

## 2021-12-03 DIAGNOSIS — I480 Paroxysmal atrial fibrillation: Secondary | ICD-10-CM | POA: Diagnosis not present

## 2021-12-03 DIAGNOSIS — I350 Nonrheumatic aortic (valve) stenosis: Secondary | ICD-10-CM

## 2021-12-03 DIAGNOSIS — I25118 Atherosclerotic heart disease of native coronary artery with other forms of angina pectoris: Secondary | ICD-10-CM

## 2021-12-03 DIAGNOSIS — Z7901 Long term (current) use of anticoagulants: Secondary | ICD-10-CM | POA: Diagnosis not present

## 2021-12-03 DIAGNOSIS — Z952 Presence of prosthetic heart valve: Secondary | ICD-10-CM | POA: Diagnosis not present

## 2021-12-03 DIAGNOSIS — I447 Left bundle-branch block, unspecified: Secondary | ICD-10-CM

## 2021-12-03 DIAGNOSIS — C50512 Malignant neoplasm of lower-outer quadrant of left female breast: Secondary | ICD-10-CM | POA: Insufficient documentation

## 2021-12-03 MED ORDER — GADOBUTROL 1 MMOL/ML IV SOLN
8.0000 mL | Freq: Once | INTRAVENOUS | Status: AC | PRN
  Administered 2021-12-03: 8 mL via INTRAVENOUS

## 2021-12-03 MED ORDER — AMIODARONE HCL 200 MG PO TABS: 200.0000 mg | ORAL_TABLET | Freq: Every day | ORAL | 3 refills | Status: DC

## 2021-12-03 NOTE — Patient Instructions (Signed)
Medication Instructions:   Your physician recommends that you continue on your current medications as directed. Please refer to the Current Medication list given to you today.   *If you need a refill on your cardiac medications before your next appointment, please call your pharmacy*   Lab Work:  None ordered.  If you have labs (blood work) drawn today and your tests are completely normal, you will receive your results only by: [place] (if you have MyChart) OR A paper copy in the mail If you have any lab test that is abnormal or we need to change your treatment, we will call you to review the results.   Testing/Procedures:  None ordered.   Follow-Up: At [facility] you and your health needs are our priority.  As part of our continuing mission to provide you with exceptional heart care, we have created designated Provider Care Teams.  These Care Teams include your primary Cardiologist (physician) and Advanced Practice Providers (APPs -  Physician Assistants and Nurse Practitioners) who all work together to provide you with the care you need, when you need it.  We recommend signing up for the patient portal called "MyChart".  Sign up information is provided on this After Visit Summary.  MyChart is used to connect with patients for Virtual Visits (Telemedicine).  Patients are able to view lab/test results, encounter notes, upcoming appointments, etc.  Non-urgent messages can be sent to your provider as well.   To learn more about what you can do with MyChart, go to [email or web address].    Your next appointment:   3 month(s)  The format for your next appointment:   In Person  Provider:   Sinclair Grooms, MD    Important Information About Sugar

## 2021-12-04 ENCOUNTER — Encounter: Payer: Self-pay | Admitting: *Deleted

## 2021-12-04 ENCOUNTER — Encounter: Payer: Self-pay | Admitting: Nurse Practitioner

## 2021-12-05 DIAGNOSIS — I480 Paroxysmal atrial fibrillation: Secondary | ICD-10-CM | POA: Diagnosis not present

## 2021-12-05 DIAGNOSIS — Z170 Estrogen receptor positive status [ER+]: Secondary | ICD-10-CM | POA: Diagnosis not present

## 2021-12-05 DIAGNOSIS — M8580 Other specified disorders of bone density and structure, unspecified site: Secondary | ICD-10-CM | POA: Diagnosis not present

## 2021-12-05 DIAGNOSIS — E039 Hypothyroidism, unspecified: Secondary | ICD-10-CM | POA: Diagnosis not present

## 2021-12-05 DIAGNOSIS — I6529 Occlusion and stenosis of unspecified carotid artery: Secondary | ICD-10-CM | POA: Diagnosis not present

## 2021-12-05 DIAGNOSIS — I10 Essential (primary) hypertension: Secondary | ICD-10-CM | POA: Diagnosis not present

## 2021-12-05 DIAGNOSIS — D649 Anemia, unspecified: Secondary | ICD-10-CM | POA: Diagnosis not present

## 2021-12-05 DIAGNOSIS — C50512 Malignant neoplasm of lower-outer quadrant of left female breast: Secondary | ICD-10-CM | POA: Diagnosis not present

## 2021-12-05 DIAGNOSIS — M353 Polymyalgia rheumatica: Secondary | ICD-10-CM | POA: Diagnosis not present

## 2021-12-05 DIAGNOSIS — I2510 Atherosclerotic heart disease of native coronary artery without angina pectoris: Secondary | ICD-10-CM | POA: Diagnosis not present

## 2021-12-05 DIAGNOSIS — R7301 Impaired fasting glucose: Secondary | ICD-10-CM | POA: Diagnosis not present

## 2021-12-05 DIAGNOSIS — E785 Hyperlipidemia, unspecified: Secondary | ICD-10-CM | POA: Diagnosis not present

## 2021-12-05 DIAGNOSIS — M355 Multifocal fibrosclerosis: Secondary | ICD-10-CM | POA: Diagnosis not present

## 2021-12-08 DIAGNOSIS — D649 Anemia, unspecified: Secondary | ICD-10-CM | POA: Diagnosis not present

## 2021-12-08 DIAGNOSIS — I10 Essential (primary) hypertension: Secondary | ICD-10-CM | POA: Diagnosis not present

## 2021-12-08 DIAGNOSIS — E039 Hypothyroidism, unspecified: Secondary | ICD-10-CM | POA: Diagnosis not present

## 2021-12-08 DIAGNOSIS — E538 Deficiency of other specified B group vitamins: Secondary | ICD-10-CM | POA: Diagnosis not present

## 2021-12-17 ENCOUNTER — Ambulatory Visit: Payer: Medicare Other | Admitting: Interventional Cardiology

## 2021-12-19 ENCOUNTER — Other Ambulatory Visit: Payer: Self-pay | Admitting: General Surgery

## 2021-12-19 DIAGNOSIS — R9389 Abnormal findings on diagnostic imaging of other specified body structures: Secondary | ICD-10-CM

## 2021-12-19 DIAGNOSIS — C50512 Malignant neoplasm of lower-outer quadrant of left female breast: Secondary | ICD-10-CM

## 2021-12-19 NOTE — Progress Notes (Signed)
Patient chart reviewed with Dr Marcie Bal. Patient has hx of recent TAVR 08-19-21 for severe AS. She developed PAF post op and is on Eliquis and Amidarone and has recent bouts with a-fib resulting in SOB. She also has hx HTN, LDL, PMR and CAD. Due to these multiple comorbidities, patient will need to be done at [facility]. WendyRN at Dr Cristal Generous office made aware.

## 2021-12-23 DIAGNOSIS — M8580 Other specified disorders of bone density and structure, unspecified site: Secondary | ICD-10-CM | POA: Diagnosis not present

## 2021-12-23 DIAGNOSIS — Z6828 Body mass index (BMI) 28.0-28.9, adult: Secondary | ICD-10-CM | POA: Diagnosis not present

## 2021-12-23 DIAGNOSIS — M316 Other giant cell arteritis: Secondary | ICD-10-CM | POA: Diagnosis not present

## 2021-12-23 DIAGNOSIS — E663 Overweight: Secondary | ICD-10-CM | POA: Diagnosis not present

## 2021-12-23 DIAGNOSIS — M353 Polymyalgia rheumatica: Secondary | ICD-10-CM | POA: Diagnosis not present

## 2021-12-23 DIAGNOSIS — Z7952 Long term (current) use of systemic steroids: Secondary | ICD-10-CM | POA: Diagnosis not present

## 2021-12-24 ENCOUNTER — Ambulatory Visit
Admission: RE | Admit: 2021-12-24 | Discharge: 2021-12-24 | Disposition: A | Discharge status: Home / Self Care | Payer: Medicare Other | Source: Ambulatory Visit | Attending: General Surgery | Admitting: General Surgery

## 2021-12-24 DIAGNOSIS — C50412 Malignant neoplasm of upper-outer quadrant of left female breast: Secondary | ICD-10-CM | POA: Diagnosis not present

## 2021-12-24 DIAGNOSIS — R590 Localized enlarged lymph nodes: Secondary | ICD-10-CM | POA: Diagnosis not present

## 2021-12-24 DIAGNOSIS — R9389 Abnormal findings on diagnostic imaging of other specified body structures: Secondary | ICD-10-CM

## 2021-12-24 DIAGNOSIS — C50912 Malignant neoplasm of unspecified site of left female breast: Secondary | ICD-10-CM | POA: Diagnosis not present

## 2021-12-24 DIAGNOSIS — C50812 Malignant neoplasm of overlapping sites of left female breast: Secondary | ICD-10-CM | POA: Diagnosis not present

## 2021-12-24 MED ORDER — GADOBUTROL 1 MMOL/ML IV SOLN: 7.0000 mL | Freq: Once | INTRAVENOUS | Status: DC | PRN

## 2021-12-24 NOTE — Pre-Procedure Instructions (Signed)
Surgical Instructions    Your procedure is scheduled on Monday, September 18.  Report to [facility] Main Entrance "A" at 5:30 A.M., then check in with the Admitting office.  Call this number if you have problems the morning of surgery:  [phone]   If you have any questions prior to your surgery date call [phone]: Open Monday-Friday 8am-4pm    Remember:  Do not eat after midnight the night before your surgery  You may drink clear liquids until 4:30AM the morning of your surgery.   Clear liquids allowed are: Water, Non-Citrus Juices (without pulp), Carbonated Beverages, Clear Tea, Black Coffee ONLY (NO MILK, CREAM OR POWDERED CREAMER of any kind), and Gatorade  Patient Instructions  The night before surgery:  No food after midnight. ONLY clear liquids after midnight  The day of surgery (if you do NOT have diabetes):  Drink ONE (1) Pre-Surgery Clear Ensure by 4:30AM the morning of surgery. Drink in one sitting. Do not sip.  This drink was given to you during your hospital  pre-op appointment visit.  Nothing else to drink after completing the  Pre-Surgery Clear Ensure.          If you have questions, please contact your surgeon's office.     Take these medicines the morning of surgery with A SIP OF WATER:  levothyroxine (SYNTHROID)  metoprolol succinate (TOPROL XL) acetaminophen (TYLENOL) if needed dicyclomine (BENTYL)  if needed  Follow your surgeon's instructions on when to stop Eliquis. If no instructions were given by your surgeon then you will need to call the office to get those instructions.    Follow your surgeon's instructions on taking/stopping Methotrexate. If no instructions were given by your surgeon then you will need to call the office to get those instructions.      As of today, STOP taking any Aspirin (unless otherwise instructed by your surgeon) Aleve, Naproxen, Ibuprofen, Motrin, Advil, Goody's, BC's, all herbal medications, fish oil, and all  vitamins.   [facility] is not responsible for any belongings or valuables.    Do NOT Smoke (Tobacco/Vaping)  24 hours prior to your procedure  If you use a CPAP at night, you may bring your mask for your overnight stay.   Contacts, glasses, hearing aids, dentures or partials may not be worn into surgery, please bring cases for these belongings   For patients admitted to the hospital, discharge time will be determined by your treatment team.   Patients discharged the day of surgery will not be allowed to drive home, and someone needs to stay with them for 24 hours.   SURGICAL WAITING ROOM VISITATION Patients having surgery or a procedure may have no more than 2 support people in the waiting area - these visitors may rotate.   Children under the age of 21 must have an adult with them who is not the patient. If the patient needs to stay at the hospital during part of their recovery, the visitor guidelines for inpatient rooms apply. Pre-op nurse will coordinate an appropriate time for 1 support person to accompany patient in pre-op.  This support person may not rotate.   Please refer to the [facility] website for the visitor guidelines for Inpatients (after your surgery is over and you are in a regular room).    Special instructions:    Oral Hygiene is also important to reduce your risk of infection.  Remember - BRUSH YOUR TEETH THE MORNING OF SURGERY WITH YOUR REGULAR TOOTHPASTE   [place]-  Preparing For Surgery  Before surgery, you can play an important role. Because skin is not sterile, your skin needs to be as free of germs as possible. You can reduce the number of germs on your skin by washing with CHG (chlorahexidine gluconate) Soap before surgery.  CHG is an antiseptic cleaner which kills germs and bonds with the skin to continue killing germs even after washing.     Please do not use if you have an allergy to CHG or antibacterial soaps. If your skin becomes  reddened/irritated stop using the CHG.  Do not shave (including legs and underarms) for at least 48 hours prior to first CHG shower. It is OK to shave your face.  Please follow these instructions carefully.     Shower the NIGHT BEFORE SURGERY and the MORNING OF SURGERY with CHG Soap.   If you chose to wash your hair, wash your hair first as usual with your normal shampoo. After you shampoo, rinse your hair and body thoroughly to remove the shampoo.  Then [employer] and genitals (private parts) with your normal soap and rinse thoroughly to remove soap.  After that Use CHG Soap as you would any other liquid soap. You can apply CHG directly to the skin and wash gently with a scrungie or a clean washcloth.   Apply the CHG Soap to your body ONLY FROM THE NECK DOWN.  Do not use on open wounds or open sores. Avoid contact with your eyes, ears, mouth and genitals (private parts). Wash Face and genitals (private parts)  with your normal soap.   Wash thoroughly, paying special attention to the area where your surgery will be performed.  Thoroughly rinse your body with warm water from the neck down.  DO NOT shower/wash with your normal soap after using and rinsing off the CHG Soap.  Pat yourself dry with a CLEAN TOWEL.  Wear CLEAN PAJAMAS to bed the night before surgery  Place CLEAN SHEETS on your bed the night before your surgery  DO NOT SLEEP WITH PETS.   Day of Surgery:  Take a shower with CHG soap. Wear Clean/Comfortable clothing the morning of surgery Do not wear jewelry or makeup. Do not wear lotions, powders, perfumes/cologne or deodorant. Do not shave 48 hours prior to surgery.  Men may shave face and neck. Do not bring valuables to the hospital. Do not wear nail polish, gel polish, artificial nails, or any other type of covering on natural nails (fingers and toes) If you have artificial nails or gel coating that need to be removed by a nail salon, please have this removed prior to  surgery. Artificial nails or gel coating may interfere with anesthesia's ability to adequately monitor your vital signs.  Remember to brush your teeth WITH YOUR REGULAR TOOTHPASTE.    If you received a COVID test during your pre-op visit, it is requested that you wear a mask when out in public, stay away from anyone that may not be feeling well, and notify your surgeon if you develop symptoms. If you have been in contact with anyone that has tested positive in the last 10 days, please notify your surgeon.    Please read over the following fact sheets that you were given.

## 2021-12-25 ENCOUNTER — Other Ambulatory Visit: Payer: Self-pay

## 2021-12-25 ENCOUNTER — Encounter (HOSPITAL_COMMUNITY): Payer: Self-pay

## 2021-12-25 ENCOUNTER — Encounter (HOSPITAL_COMMUNITY)
Admission: RE | Admit: 2021-12-25 | Discharge: 2021-12-25 | Disposition: A | Discharge status: Home / Self Care | Payer: Medicare Other | Source: Ambulatory Visit | Attending: General Surgery | Admitting: General Surgery

## 2021-12-25 VITALS — BP 147/84 | HR 54 | Temp 97.8°F | Resp 17 | Ht 65.0 in | Wt 173.3 lb

## 2021-12-25 DIAGNOSIS — I10 Essential (primary) hypertension: Secondary | ICD-10-CM | POA: Insufficient documentation

## 2021-12-25 DIAGNOSIS — Z952 Presence of prosthetic heart valve: Secondary | ICD-10-CM | POA: Diagnosis not present

## 2021-12-25 DIAGNOSIS — I480 Paroxysmal atrial fibrillation: Secondary | ICD-10-CM | POA: Insufficient documentation

## 2021-12-25 DIAGNOSIS — M353 Polymyalgia rheumatica: Secondary | ICD-10-CM | POA: Insufficient documentation

## 2021-12-25 DIAGNOSIS — I2510 Atherosclerotic heart disease of native coronary artery without angina pectoris: Secondary | ICD-10-CM | POA: Diagnosis not present

## 2021-12-25 DIAGNOSIS — C50912 Malignant neoplasm of unspecified site of left female breast: Secondary | ICD-10-CM | POA: Diagnosis not present

## 2021-12-25 DIAGNOSIS — M1990 Unspecified osteoarthritis, unspecified site: Secondary | ICD-10-CM | POA: Diagnosis not present

## 2021-12-25 DIAGNOSIS — Z01812 Encounter for preprocedural laboratory examination: Secondary | ICD-10-CM | POA: Insufficient documentation

## 2021-12-25 DIAGNOSIS — I350 Nonrheumatic aortic (valve) stenosis: Secondary | ICD-10-CM | POA: Diagnosis not present

## 2021-12-25 DIAGNOSIS — E785 Hyperlipidemia, unspecified: Secondary | ICD-10-CM | POA: Diagnosis not present

## 2021-12-25 DIAGNOSIS — E039 Hypothyroidism, unspecified: Secondary | ICD-10-CM | POA: Insufficient documentation

## 2021-12-25 HISTORY — DX: Dissection of descending thoracic aorta: I71.012

## 2021-12-25 HISTORY — DX: Pneumonia, unspecified organism: J18.9

## 2021-12-25 LAB — CBC
HCT: 34.9 % — ABNORMAL LOW (ref 36.0–46.0)
Hemoglobin: 11.3 g/dL — ABNORMAL LOW (ref 12.0–15.0)
MCH: 35.4 pg — ABNORMAL HIGH (ref 26.0–34.0)
MCHC: 32.4 g/dL (ref 30.0–36.0)
MCV: 109.4 fL — ABNORMAL HIGH (ref 80.0–100.0)
Platelets: 219 10*3/uL (ref 150–400)
RBC: 3.19 MIL/uL — ABNORMAL LOW (ref 3.87–5.11)
RDW: 14.4 % (ref 11.5–15.5)
WBC: 6.6 10*3/uL (ref 4.0–10.5)
nRBC: 0 % (ref 0.0–0.2)

## 2021-12-25 NOTE — Progress Notes (Signed)
BMET hemolyzed- will recollect DOS. Order placed.

## 2021-12-25 NOTE — Progress Notes (Addendum)
PCP - Crist Infante Cardiologist - Daneen Schick Vascular: Trula Slade  PPM/ICD - loop recorder removed at the beginning of 2023   Chest x-ray - 08/20/21 EKG - 12/03/21 Stress Test - denies ECHO - 09/17/21 Cardiac Cath - 06/19/21  Sleep Study - denies   Follow your surgeon's instructions on when to stop Eliquis. If no instructions were given by your surgeon then you will need to call the office to get those instructions.     Follow your surgeon's instructions on taking/stopping Methotrexate. If no instructions were given by your surgeon then you will need to call the office to get those instructions.     As of today, STOP taking any Aspirin (unless otherwise instructed by your surgeon) Aleve, Naproxen, Ibuprofen, Motrin, Advil, Goody's, BC's, all herbal medications, fish oil, and all vitamins.  ERAS Protcol -yes PRE-SURGERY Ensure or G2- ensure given  COVID TEST- no   Anesthesia review: yes,  significant cardiac history.   Patient denies shortness of breath, fever, cough and chest pain at PAT appointment   All instructions explained to the patient, with a verbal understanding of the material. Patient agrees to go over the instructions while at home for a better understanding. Patient also instructed to self quarantine after being tested for COVID-19. The opportunity to ask questions was provided.

## 2021-12-26 ENCOUNTER — Encounter (HOSPITAL_COMMUNITY): Payer: Self-pay

## 2021-12-26 ENCOUNTER — Encounter (HOSPITAL_COMMUNITY)
Admission: RE | Admit: 2021-12-26 | Discharge: 2021-12-26 | Disposition: A | Discharge status: Home / Self Care | Payer: Medicare Other | Source: Ambulatory Visit | Attending: General Surgery | Admitting: General Surgery

## 2021-12-26 DIAGNOSIS — Z952 Presence of prosthetic heart valve: Secondary | ICD-10-CM | POA: Diagnosis not present

## 2021-12-26 DIAGNOSIS — I10 Essential (primary) hypertension: Secondary | ICD-10-CM | POA: Diagnosis not present

## 2021-12-26 DIAGNOSIS — I4891 Unspecified atrial fibrillation: Secondary | ICD-10-CM | POA: Diagnosis not present

## 2021-12-26 DIAGNOSIS — M1990 Unspecified osteoarthritis, unspecified site: Secondary | ICD-10-CM | POA: Diagnosis not present

## 2021-12-26 DIAGNOSIS — K219 Gastro-esophageal reflux disease without esophagitis: Secondary | ICD-10-CM | POA: Diagnosis not present

## 2021-12-26 DIAGNOSIS — Z792 Long term (current) use of antibiotics: Secondary | ICD-10-CM | POA: Diagnosis not present

## 2021-12-26 DIAGNOSIS — I2510 Atherosclerotic heart disease of native coronary artery without angina pectoris: Secondary | ICD-10-CM | POA: Diagnosis not present

## 2021-12-26 DIAGNOSIS — C773 Secondary and unspecified malignant neoplasm of axilla and upper limb lymph nodes: Secondary | ICD-10-CM | POA: Diagnosis not present

## 2021-12-26 DIAGNOSIS — C50512 Malignant neoplasm of lower-outer quadrant of left female breast: Secondary | ICD-10-CM | POA: Diagnosis not present

## 2021-12-26 DIAGNOSIS — E039 Hypothyroidism, unspecified: Secondary | ICD-10-CM | POA: Diagnosis not present

## 2021-12-26 DIAGNOSIS — Z170 Estrogen receptor positive status [ER+]: Secondary | ICD-10-CM | POA: Diagnosis not present

## 2021-12-26 DIAGNOSIS — Z803 Family history of malignant neoplasm of breast: Secondary | ICD-10-CM | POA: Diagnosis not present

## 2021-12-26 DIAGNOSIS — I71012 Dissection of descending thoracic aorta: Secondary | ICD-10-CM | POA: Diagnosis not present

## 2021-12-26 LAB — BASIC METABOLIC PANEL
Anion gap: 11 (ref 5–15)
BUN: 12 mg/dL (ref 8–23)
CO2: 21 mmol/L — ABNORMAL LOW (ref 22–32)
Calcium: 9.7 mg/dL (ref 8.9–10.3)
Chloride: 109 mmol/L (ref 98–111)
Creatinine, Ser: 0.81 mg/dL (ref 0.44–1.00)
GFR, Estimated: >60 mL/min (ref >60)
Glucose, Bld: 145 mg/dL — ABNORMAL HIGH (ref 70–99)
Potassium: 4 mmol/L (ref 3.5–5.1)
Sodium: 141 mmol/L (ref 135–145)

## 2021-12-26 NOTE — Progress Notes (Signed)
Patient states that she stopped her Eliquis and Femara today.  Dr. Cristal Generous office confirmed that this is correct.

## 2021-12-26 NOTE — Progress Notes (Signed)
Anesthesia Chart Review:  Case: [id number] Date/Time: 12/29/21 0715   Procedures:      BREAST LUMPECTOMY WITH RADIOACTIVE SEED LOCALIZATION X2 (Left)     LEFT AXILLARY NODE SEED GUIDED EXCISION (Left)     LEFT AXILLARY SENTINEL NODE BIOPSY (Left)   Anesthesia type: General   Pre-op diagnosis: LEFT BREAST CANCER   Location: [facility] ROOM 01 / [facility]   Surgeons: Rolm Bookbinder, MD       DISCUSSION: Patient is an 81 year old female scheduled for the above procedure. Case was moved from [facility] to [facility] due to co-morbidities.  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  Last cardiology follow-up was on 12/03/2021 with Christen Bame, NP. Note reviewed. She discussed plans for breast surgery. She will remain on amiodarone for now and plan to readdress at her follow-up after surgery. She reported last Eliquis was on 12/25/21. [facility] staff confirmed she is to hold Eliquis and Femara for surgery starting 12/26/21.   She had incidental finding of descending aortic dissection on post TAVR CT on 11/07/21. She was evaluated by vascular surgeon Dr. Trula Slade.  Per 11/24/2021 office note, there is no evidence of malperfusion.  No current chest or back pain.  Decision made to manage nonoperatively as long as no significant change in the diameter of her aorta.  Repeat CT scan is planned in 6 months.  Sooner if she develops any changes or symptoms.  Her BMET from 12/25/21 hemolyzed. Per posting, her RSL was scheduled for 12/26/21 at 1:00 PM, but when staff contacted her in an attempt to schedule repeat BMET prior to  [facility] placement she indicated RSL was actually placed on 12/25/21. She is a first case for 12/29/21, so she came in and had repeat BMET on 12/26/21. Results acceptable for OR.   Anesthesia team to evaluate on the day of surgery.      VS: BP (!) 147/84   Pulse (!) 54   Temp 36.6 C (Oral)   Resp 17   Ht '5\' 5"'$  (1.651 m)   Wt 78.6 kg   SpO2 99%   BMI 28.84 kg/m    PROVIDERS: Crist Infante, MD is PCP  Daneen Schick, MD is cardiologist Lauree Chandler, MD is structural heart cardiologist Thompson Grayer, MD is EP cardiologist Foye Deer, MD is HEM-ONC Gery Pray, MD is RAD-ONC Harold Barban, MD is vascular surgeon Leigh Aurora, MD is rheumatologist   LABS: Most recent lab results include: Lab Results  Component Value Date   WBC 6.6 12/25/2021   HGB 11.3 (L) 12/25/2021   HCT 34.9 (L) 12/25/2021   PLT 219 12/25/2021   GLUCOSE 145 (H) 12/26/2021   ALT 14 08/15/2021   AST 21 08/15/2021   NA 141 12/26/2021   K 4.0 12/26/2021   CL 109 12/26/2021   CREATININE 0.81 12/26/2021   BUN 12 12/26/2021   CO2 21 (L) 12/26/2021   INR 1.1 08/15/2021       IMAGES: CTA Abd/pelvis 11/07/21: IMPRESSION: VASCULAR 1. Small focal dissection of the abdominal aorta which begins at the level of the SMA and terminates above the bilateral renal arteries. 2. See separately dictated same day CT of the chest for discussion of findings above  the diaphragm.   NON-VASCULAR 1. Mild soft tissue stranding is seen about the sigmoid colon, decreased when compared with prior CT of the abdomen and pelvis, compatible with resolving acute diverticulitis.   CTA Chest 11/07/21: IMPRESSION: 1. 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 Aorta is unchanged in caliber when compared with prior CT of the abdomen and pelvis dated October 29, 2021. Additional deflection flap is seen involving the partially visualized abdominal aorta, recommend CTA of the abdomen and pelvis for  complete evaluation of distal extent. 2. Aortic Atherosclerosis (ICD10-I70.0).    EKG: 12/03/21: NSR. Non-specific ST abnormality.   CV: Echo 09/17/21  1. The aortic valve has been repaired/replaced. Aortic valve  regurgitation is not visualized. There is a 23 mm Sapien prosthetic (TAVR)  valve present in the aortic position. Procedure Date: 08/19/2021. Echo  findings are consistent with normal structure  and function of the aortic valve prosthesis. Aortic valve area, by VTI  measures 1.75 cm. Aortic valve mean gradient measures 9.8 mmHg. Aortic  valve Vmax measures 2.08 m/s.   2. Left ventricular ejection fraction, by estimation, is 55 to 60%. The  left ventricle has normal function. The left ventricle has no regional  wall motion abnormalities. Left ventricular diastolic parameters are  consistent with Grade II diastolic  dysfunction (pseudonormalization).   3. Right ventricular systolic function is normal. The right ventricular  size is normal.   4. The mitral valve is grossly normal. Trivial mitral valve  regurgitation. No evidence of mitral stenosis.   5. The inferior vena cava is normal in size with greater than 50%  respiratory variability, suggesting right atrial pressure of 3 mmHg.   Comparison(s): No significant change from prior study. Normal prosthesis.     Cardiac monitor 5/10-23-08/2421 Basic underlying rhythm is NSR and sinus bradycardia. Average HR 60 bpm. No sinus rhythm rate > 100 bpm Rare SVT, longest 11 beats at 94 bpm. No atrial fibrillation. No sustained bradycardia or pauses. No AV block Chronotropic incompetence is not excluded by this study   [facility] 06/19/21 CONCLUSIONS: Severe aortic stenosis with mean transvalvular gradient 50.3 mmHg, and calculated aortic valve area 0.89 cm. Widely patent left main, LAD, circumflex, and right coronary.  First diagonal contains 70 to 80% mid RCA. Mild pulmonary hypertension, mean PA pressure 24 mmHg.  Mean wedge  pressure 14 mmHg (likely inaccurate) LVEDP 21 mmHg.  WHO group 2. PA arterial saturation 75%.  Aortic arterial saturation 99%.  Cardiac output 6.63 L/min.   RECOMMENDATIONS: Severe aortic stenosis and will be referred to Dr. Angelena Form in the structural heart clinic to begin work-up for TAVR. Risk factor modification for minor coronary atherosclerosis as has been occurring over the past 3 years.    US Carotid 7/1/715: IMPRESSION:  1. Mild carotid bifurcation and proximal ICA plaque bilaterally,  resulting in less than 50% diameter stenosis. The exam does not  exclude plaque ulceration or embolization. Continued surveillance  recommended.    Past Medical History:  Diagnosis Date   Arthritis    osteoarthritis   CAD (coronary artery disease)    Descending thoracic aortic dissection ([place])    followed by Dr. 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 Angelena Form & Dr. Cyndia Bent .   Severe aortic stenosis     Past Surgical History:  Procedure Laterality Date   BREAST BIOPSY Bilateral 1970's   x2 one a side   CATARACT EXTRACTION     bilateral   implantable loop recorder removal  05/30/2021   MDT reveal LINQ removed by Dr Rayann Heman   INTRAOPERATIVE TRANSTHORACIC ECHOCARDIOGRAM N/A 08/19/2021   Procedure: INTRAOPERATIVE TRANSTHORACIC ECHOCARDIOGRAM;  Surgeon: Burnell Blanks, MD;  Location: [facility];  Service: Open Heart Surgery;  Laterality: N/A;   LOOP RECORDER INSERTION N/A 08/11/2017   Procedure: LOOP RECORDER INSERTION;  Surgeon:  Thompson Grayer, MD;  Location: [facility];  Service: Cardiovascular;  Laterality: N/A;   RETINAL DETACHMENT SURGERY  2000   RIGHT/LEFT HEART CATH AND CORONARY ANGIOGRAPHY N/A 06/19/2021   Procedure: RIGHT/LEFT HEART CATH AND CORONARY ANGIOGRAPHY;  Surgeon: Belva Crome, MD;  Location: [facility];  Service: Cardiovascular;  Laterality: N/A;   THYROIDECTOMY  1967   TONSILLECTOMY     TOTAL KNEE ARTHROPLASTY  04/25/2012   Procedure: TOTAL KNEE BILATERAL;  Surgeon: Gearlean Alf, MD;  Location: [facility];  Service: Orthopedics;  Laterality: Bilateral;   TRANSCATHETER AORTIC VALVE REPLACEMENT, TRANSFEMORAL N/A 08/19/2021   Procedure: Transcatheter Aortic Valve Replacement, Transfemoral Using 73m Sapien 3 Ultra Edwards Valve;  Surgeon: MBurnell Blanks MD;  Location: [facility]  Service: Open Heart Surgery;  Laterality: N/A;    MEDICATIONS:  acetaminophen (TYLENOL) 500 MG tablet   amiodarone (PACERONE) 200 MG tablet   amoxicillin (AMOXIL) 500 MG capsule   apixaban (ELIQUIS) 5 MG TABS tablet   b complex vitamins capsule   B-D TB SYRINGE 1CC/27GX1/2" 27G X 1/2" 1 ML MISC   Calcium Carb-Cholecalciferol (CALCIUM + D3 PO)   cholecalciferol (VITAMIN D3) 25 MCG (1000 UNIT) tablet   denosumab (PROLIA) 60 MG/ML SOSY injection   dicyclomine (BENTYL) 10 MG capsule   folic acid (FOLVITE) 1 MG tablet   letrozole (FEMARA) 2.5 MG tablet   levothyroxine (SYNTHROID) 100 MCG tablet   loperamide (IMODIUM) 2 MG capsule   methotrexate 50 MG/2ML injection   metoprolol succinate (TOPROL XL) 25 MG 24 hr tablet   mometasone (ELOCON) 0.1 % ointment   nitroGLYCERIN (NITROSTAT) 0.4 MG SL tablet   nystatin cream (MYCOSTATIN)   REPATHA SURECLICK 1476MG/ML SOAJ   telmisartan (MICARDIS) 20 MG tablet   zolpidem (AMBIEN) 5 MG tablet   No current facility-administered medications for this encounter.    AMyra Gianotti PA-C Surgical Short Stay/Anesthesiology MWillow Springs CenterPhone [phone] Rehabilitation HospitalPhone  [phone]/15/2023 1:12 PM

## 2021-12-26 NOTE — Anesthesia Preprocedure Evaluation (Signed)
Anesthesia Evaluation  Patient identified by MRN, date of birth, ID band Patient awake    Reviewed: Allergy & Precautions, NPO status , Patient's Chart, lab work & pertinent test results, reviewed documented beta blocker date and time   History of Anesthesia Complications Negative for: history of anesthetic complications  Airway Mallampati: I  TM Distance: >3 FB Neck ROM: Full    Dental  (+) Teeth Intact, Dental Advisory Given   Pulmonary neg pulmonary ROS,    breath sounds clear to auscultation       Cardiovascular hypertension, Pt. on medications and Pt. on home beta blockers + CAD and + Peripheral Vascular Disease (descending thoracic aortic dissection-small, occurred during TAVR?)  + dysrhythmias Atrial Fibrillation + Valvular Problems/Murmurs (s/p TAVR)  Rhythm:Regular Rate:Normal  09/2021 ECHO: EF 55-60%, normal LVF, grade 2 DD, normal RVF, s/p TAVR with good function, mean grad 9.8 mmHg 06/2021 CATH: mild coronary atherosclerosis, 70-80% D1   Neuro/Psych negative neurological ROS     GI/Hepatic Neg liver ROS, GERD  Controlled,  Endo/Other  Hypothyroidism   Renal/GU negative Renal ROS     Musculoskeletal  (+) Arthritis ,   Abdominal   Peds  Hematology eliquis   Anesthesia Other Findings   Reproductive/Obstetrics                           Anesthesia Physical Anesthesia Plan  ASA: 4  Anesthesia Plan: General   Post-op Pain Management: Regional block* and Tylenol PO (pre-op)*   Induction: Intravenous  PONV Risk Score and Plan: 3 and Ondansetron, Dexamethasone and Treatment may vary due to age or medical condition  Airway Management Planned: LMA  Additional Equipment: None  Intra-op Plan:   Post-operative Plan:   Informed Consent: I have reviewed the patients History and Physical, chart, labs and discussed the procedure including the risks, benefits and alternatives for the  proposed anesthesia with the patient or authorized representative who has indicated his/her understanding and acceptance.     Dental advisory given  Plan Discussed with: CRNA and Surgeon  Anesthesia Plan Comments: (PAT note written 12/26/2021 by Myra Gianotti, PA-C. Plan routine monitors GA with pectoralis block for post op analgesia )      Anesthesia Quick Evaluation

## 2021-12-29 ENCOUNTER — Encounter (HOSPITAL_COMMUNITY)
Admission: RE | Disposition: A | Discharge status: Home / Self Care | Payer: Self-pay | Source: Home / Self Care | Attending: General Surgery

## 2021-12-29 ENCOUNTER — Other Ambulatory Visit: Payer: Self-pay

## 2021-12-29 ENCOUNTER — Ambulatory Visit (HOSPITAL_BASED_OUTPATIENT_CLINIC_OR_DEPARTMENT_OTHER): Payer: Medicare Other | Admitting: Vascular Surgery

## 2021-12-29 ENCOUNTER — Ambulatory Visit (HOSPITAL_COMMUNITY): Payer: Medicare Other | Admitting: Vascular Surgery

## 2021-12-29 ENCOUNTER — Ambulatory Visit (HOSPITAL_COMMUNITY)
Admission: RE | Admit: 2021-12-29 | Discharge: 2021-12-29 | Disposition: A | Discharge status: Home / Self Care | Payer: Medicare Other | Attending: General Surgery | Admitting: General Surgery

## 2021-12-29 DIAGNOSIS — Z170 Estrogen receptor positive status [ER+]: Secondary | ICD-10-CM

## 2021-12-29 DIAGNOSIS — Z792 Long term (current) use of antibiotics: Secondary | ICD-10-CM | POA: Insufficient documentation

## 2021-12-29 DIAGNOSIS — R920 Mammographic microcalcification found on diagnostic imaging of breast: Secondary | ICD-10-CM | POA: Diagnosis not present

## 2021-12-29 DIAGNOSIS — C50912 Malignant neoplasm of unspecified site of left female breast: Secondary | ICD-10-CM

## 2021-12-29 DIAGNOSIS — I10 Essential (primary) hypertension: Secondary | ICD-10-CM | POA: Insufficient documentation

## 2021-12-29 DIAGNOSIS — M1990 Unspecified osteoarthritis, unspecified site: Secondary | ICD-10-CM | POA: Insufficient documentation

## 2021-12-29 DIAGNOSIS — Z803 Family history of malignant neoplasm of breast: Secondary | ICD-10-CM | POA: Insufficient documentation

## 2021-12-29 DIAGNOSIS — C50512 Malignant neoplasm of lower-outer quadrant of left female breast: Secondary | ICD-10-CM | POA: Insufficient documentation

## 2021-12-29 DIAGNOSIS — I2510 Atherosclerotic heart disease of native coronary artery without angina pectoris: Secondary | ICD-10-CM | POA: Insufficient documentation

## 2021-12-29 DIAGNOSIS — I71012 Dissection of descending thoracic aorta: Secondary | ICD-10-CM | POA: Insufficient documentation

## 2021-12-29 DIAGNOSIS — K219 Gastro-esophageal reflux disease without esophagitis: Secondary | ICD-10-CM | POA: Insufficient documentation

## 2021-12-29 DIAGNOSIS — E039 Hypothyroidism, unspecified: Secondary | ICD-10-CM | POA: Insufficient documentation

## 2021-12-29 DIAGNOSIS — I4891 Unspecified atrial fibrillation: Secondary | ICD-10-CM | POA: Insufficient documentation

## 2021-12-29 DIAGNOSIS — C773 Secondary and unspecified malignant neoplasm of axilla and upper limb lymph nodes: Secondary | ICD-10-CM | POA: Diagnosis not present

## 2021-12-29 DIAGNOSIS — N6082 Other benign mammary dysplasias of left breast: Secondary | ICD-10-CM | POA: Diagnosis not present

## 2021-12-29 DIAGNOSIS — N6012 Diffuse cystic mastopathy of left breast: Secondary | ICD-10-CM | POA: Diagnosis not present

## 2021-12-29 DIAGNOSIS — Z952 Presence of prosthetic heart valve: Secondary | ICD-10-CM | POA: Insufficient documentation

## 2021-12-29 DIAGNOSIS — N6092 Unspecified benign mammary dysplasia of left breast: Secondary | ICD-10-CM | POA: Diagnosis not present

## 2021-12-29 DIAGNOSIS — Z01818 Encounter for other preprocedural examination: Secondary | ICD-10-CM

## 2021-12-29 DIAGNOSIS — I25118 Atherosclerotic heart disease of native coronary artery with other forms of angina pectoris: Secondary | ICD-10-CM

## 2021-12-29 DIAGNOSIS — G8918 Other acute postprocedural pain: Secondary | ICD-10-CM | POA: Diagnosis not present

## 2021-12-29 DIAGNOSIS — N62 Hypertrophy of breast: Secondary | ICD-10-CM | POA: Diagnosis not present

## 2021-12-29 DIAGNOSIS — D242 Benign neoplasm of left breast: Secondary | ICD-10-CM | POA: Diagnosis not present

## 2021-12-29 HISTORY — PX: RADIOACTIVE SEED GUIDED AXILLARY SENTINEL LYMPH NODE: SHX6735

## 2021-12-29 HISTORY — PX: BREAST LUMPECTOMY WITH RADIOACTIVE SEED LOCALIZATION: SHX6424

## 2021-12-29 HISTORY — PX: AXILLARY SENTINEL NODE BIOPSY: SHX5738

## 2021-12-29 SURGERY — BREAST LUMPECTOMY WITH RADIOACTIVE SEED LOCALIZATION
Anesthesia: General | Site: Breast | Laterality: Left

## 2021-12-29 MED ORDER — CHLORHEXIDINE GLUCONATE 0.12 % MT SOLN
15.0000 mL | Freq: Once | OROMUCOSAL | Status: AC
  Administered 2021-12-29: 15 mL via OROMUCOSAL
  Filled 2021-12-29: qty 15

## 2021-12-29 MED ORDER — CHLORHEXIDINE GLUCONATE CLOTH 2 % EX PADS: 6.0000 | MEDICATED_PAD | Freq: Once | CUTANEOUS | Status: DC

## 2021-12-29 MED ORDER — MEPERIDINE HCL 25 MG/ML IJ SOLN: 6.2500 mg | INTRAMUSCULAR | Status: DC | PRN

## 2021-12-29 MED ORDER — EPHEDRINE SULFATE-NACL 50-0.9 MG/10ML-% IV SOSY
PREFILLED_SYRINGE | INTRAVENOUS | Status: DC | PRN
  Administered 2021-12-29 (×2): 5 mg via INTRAVENOUS
  Administered 2021-12-29: 10 mg via INTRAVENOUS
  Administered 2021-12-29: 5 mg via INTRAVENOUS
  Administered 2021-12-29: 10 mg via INTRAVENOUS
  Administered 2021-12-29: 5 mg via INTRAVENOUS

## 2021-12-29 MED ORDER — BUPIVACAINE-EPINEPHRINE (PF) 0.25% -1:200000 IJ SOLN
INTRAMUSCULAR | Status: AC
  Filled 2021-12-29: qty 30

## 2021-12-29 MED ORDER — HEMOSTATIC AGENTS (NO CHARGE) OPTIME
TOPICAL | Status: DC | PRN
  Administered 2021-12-29 (×2): 1 via TOPICAL

## 2021-12-29 MED ORDER — AMISULPRIDE (ANTIEMETIC) 5 MG/2ML IV SOLN
INTRAVENOUS | Status: AC
  Filled 2021-12-29: qty 2

## 2021-12-29 MED ORDER — ENSURE PRE-SURGERY PO LIQD: 296.0000 mL | Freq: Once | ORAL | Status: DC

## 2021-12-29 MED ORDER — OXYCODONE HCL 5 MG/5ML PO SOLN: 5.0000 mg | Freq: Once | ORAL | Status: DC | PRN

## 2021-12-29 MED ORDER — MAGTRACE LYMPHATIC TRACER
INTRAMUSCULAR | Status: DC | PRN
  Administered 2021-12-29: 2 mL via INTRAMUSCULAR

## 2021-12-29 MED ORDER — ORAL CARE MOUTH RINSE: 15.0000 mL | Freq: Once | OROMUCOSAL | Status: AC

## 2021-12-29 MED ORDER — SODIUM CHLORIDE (PF) 0.9 % IJ SOLN
INTRAMUSCULAR | Status: AC
  Filled 2021-12-29: qty 10

## 2021-12-29 MED ORDER — SUCCINYLCHOLINE CHLORIDE 200 MG/10ML IV SOSY
PREFILLED_SYRINGE | INTRAVENOUS | Status: AC
  Filled 2021-12-29: qty 10

## 2021-12-29 MED ORDER — AMISULPRIDE (ANTIEMETIC) 5 MG/2ML IV SOLN
5.0000 mg | Freq: Once | INTRAVENOUS | Status: AC
  Administered 2021-12-29: 5 mg via INTRAVENOUS

## 2021-12-29 MED ORDER — LIDOCAINE 2% (20 MG/ML) 5 ML SYRINGE
INTRAMUSCULAR | Status: AC
  Filled 2021-12-29: qty 5

## 2021-12-29 MED ORDER — PROMETHAZINE HCL 25 MG/ML IJ SOLN: 6.2500 mg | INTRAMUSCULAR | Status: DC | PRN

## 2021-12-29 MED ORDER — SODIUM CHLORIDE (PF) 0.9 % IJ SOLN
INTRAVENOUS | Status: DC | PRN
  Administered 2021-12-29: 4 mL via INTRAMUSCULAR

## 2021-12-29 MED ORDER — ACETAMINOPHEN 500 MG PO TABS
1000.0000 mg | ORAL_TABLET | Freq: Once | ORAL | Status: DC
  Filled 2021-12-29: qty 2

## 2021-12-29 MED ORDER — FENTANYL CITRATE (PF) 100 MCG/2ML IJ SOLN: 25.0000 ug | INTRAMUSCULAR | Status: DC | PRN

## 2021-12-29 MED ORDER — ACETAMINOPHEN 500 MG PO TABS
1000.0000 mg | ORAL_TABLET | ORAL | Status: AC
  Administered 2021-12-29: 1000 mg via ORAL

## 2021-12-29 MED ORDER — TRAMADOL HCL 50 MG PO TABS: 50.0000 mg | ORAL_TABLET | Freq: Four times a day (QID) | ORAL | 0 refills | Status: DC | PRN

## 2021-12-29 MED ORDER — BUPIVACAINE-EPINEPHRINE (PF) 0.5% -1:200000 IJ SOLN
INTRAMUSCULAR | Status: DC | PRN
  Administered 2021-12-29: 30 mL

## 2021-12-29 MED ORDER — 0.9 % SODIUM CHLORIDE (POUR BTL) OPTIME
TOPICAL | Status: DC | PRN
  Administered 2021-12-29: 1000 mL

## 2021-12-29 MED ORDER — LACTATED RINGERS IV SOLN: INTRAVENOUS | Status: DC

## 2021-12-29 MED ORDER — BUPIVACAINE-EPINEPHRINE 0.25% -1:200000 IJ SOLN
INTRAMUSCULAR | Status: DC | PRN
  Administered 2021-12-29: 4 mL

## 2021-12-29 MED ORDER — ONDANSETRON HCL 4 MG/2ML IJ SOLN
INTRAMUSCULAR | Status: AC
  Filled 2021-12-29: qty 2

## 2021-12-29 MED ORDER — LIDOCAINE 2% (20 MG/ML) 5 ML SYRINGE
INTRAMUSCULAR | Status: DC | PRN
  Administered 2021-12-29: 20 mg via INTRAVENOUS

## 2021-12-29 MED ORDER — FENTANYL CITRATE (PF) 250 MCG/5ML IJ SOLN
INTRAMUSCULAR | Status: AC
  Filled 2021-12-29: qty 5

## 2021-12-29 MED ORDER — SUCCINYLCHOLINE CHLORIDE 200 MG/10ML IV SOSY
PREFILLED_SYRINGE | INTRAVENOUS | Status: DC | PRN
  Administered 2021-12-29: 120 mg via INTRAVENOUS

## 2021-12-29 MED ORDER — PROPOFOL 10 MG/ML IV BOLUS
INTRAVENOUS | Status: DC | PRN
  Administered 2021-12-29: 100 mg via INTRAVENOUS
  Administered 2021-12-29: 30 mg via INTRAVENOUS

## 2021-12-29 MED ORDER — METHYLENE BLUE 1 % INJ SOLN
INTRAVENOUS | Status: AC
  Filled 2021-12-29: qty 10

## 2021-12-29 MED ORDER — DEXAMETHASONE SODIUM PHOSPHATE 10 MG/ML IJ SOLN
INTRAMUSCULAR | Status: AC
  Filled 2021-12-29: qty 1

## 2021-12-29 MED ORDER — MIDAZOLAM HCL 2 MG/2ML IJ SOLN: 0.5000 mg | Freq: Once | INTRAMUSCULAR | Status: DC | PRN

## 2021-12-29 MED ORDER — DEXAMETHASONE SODIUM PHOSPHATE 10 MG/ML IJ SOLN
INTRAMUSCULAR | Status: DC | PRN
  Administered 2021-12-29: 10 mg via INTRAVENOUS

## 2021-12-29 MED ORDER — EPHEDRINE 5 MG/ML INJ
INTRAVENOUS | Status: AC
  Filled 2021-12-29: qty 5

## 2021-12-29 MED ORDER — CEFAZOLIN SODIUM-DEXTROSE 2-4 GM/100ML-% IV SOLN
2.0000 g | INTRAVENOUS | Status: AC
  Administered 2021-12-29: 2 g via INTRAVENOUS
  Filled 2021-12-29: qty 100

## 2021-12-29 MED ORDER — FENTANYL CITRATE (PF) 100 MCG/2ML IJ SOLN
INTRAMUSCULAR | Status: DC | PRN
  Administered 2021-12-29 (×2): 25 ug via INTRAVENOUS
  Administered 2021-12-29: 50 ug via INTRAVENOUS
  Administered 2021-12-29: 25 ug via INTRAVENOUS
  Administered 2021-12-29 (×2): 50 ug via INTRAVENOUS
  Administered 2021-12-29: 25 ug via INTRAVENOUS

## 2021-12-29 MED ORDER — OXYCODONE HCL 5 MG PO TABS: 5.0000 mg | ORAL_TABLET | Freq: Once | ORAL | Status: DC | PRN

## 2021-12-29 MED ORDER — ONDANSETRON HCL 4 MG/2ML IJ SOLN
INTRAMUSCULAR | Status: DC | PRN
  Administered 2021-12-29: 4 mg via INTRAVENOUS

## 2021-12-29 SURGICAL SUPPLY — 55 items
APPLIER CLIP 9.375 MED OPEN (MISCELLANEOUS) ×2
BAG COUNTER SPONGE SURGICOUNT (BAG) ×2 IMPLANT
BENZOIN TINCTURE PRP APPL 2/3 (GAUZE/BANDAGES/DRESSINGS) IMPLANT
BINDER BREAST LRG (GAUZE/BANDAGES/DRESSINGS) IMPLANT
BINDER BREAST XLRG (GAUZE/BANDAGES/DRESSINGS) IMPLANT
CANISTER SUCT 3000ML PPV (MISCELLANEOUS) ×2 IMPLANT
CHLORAPREP W/TINT 26 (MISCELLANEOUS) ×2 IMPLANT
CLIP APPLIE 9.375 MED OPEN (MISCELLANEOUS) IMPLANT
CLIP VESOCCLUDE MED 6/CT (CLIP) ×2 IMPLANT
CNTNR URN SCR LID CUP LEK RST (MISCELLANEOUS) ×2 IMPLANT
CONT SPEC 4OZ STRL OR WHT (MISCELLANEOUS) ×4
COVER PROBE W GEL 5X96 (DRAPES) ×2 IMPLANT
COVER SURGICAL LIGHT HANDLE (MISCELLANEOUS) ×2 IMPLANT
DERMABOND ADVANCED .7 DNX12 (GAUZE/BANDAGES/DRESSINGS) ×2 IMPLANT
DEVICE DUBIN SPECIMEN MAMMOGRA (MISCELLANEOUS) ×2 IMPLANT
DRAPE CHEST BREAST 15X10 FENES (DRAPES) ×2 IMPLANT
ELECT COATED BLADE 2.86 ST (ELECTRODE) ×2 IMPLANT
ELECT REM PT RETURN 9FT ADLT (ELECTROSURGICAL) ×2
ELECTRODE REM PT RTRN 9FT ADLT (ELECTROSURGICAL) ×2 IMPLANT
GAUZE 4X4 16PLY ~~LOC~~+RFID DBL (SPONGE) ×2 IMPLANT
GAUZE SPONGE 4X4 12PLY STRL (GAUZE/BANDAGES/DRESSINGS) IMPLANT
GLOVE BIO SURGEON STRL SZ7 (GLOVE) ×4 IMPLANT
GLOVE BIOGEL PI IND STRL 7.5 (GLOVE) ×2 IMPLANT
GOWN STRL REUS W/ TWL LRG LVL3 (GOWN DISPOSABLE) ×4 IMPLANT
GOWN STRL REUS W/TWL LRG LVL3 (GOWN DISPOSABLE) ×4
HEMOSTAT ARISTA ABSORB 3G PWDR (HEMOSTASIS) IMPLANT
KIT BASIN OR (CUSTOM PROCEDURE TRAY) ×2 IMPLANT
KIT MARKER MARGIN INK (KITS) ×2 IMPLANT
KIT TURNOVER KIT B (KITS) ×2 IMPLANT
LIGHT WAVEGUIDE WIDE FLAT (MISCELLANEOUS) IMPLANT
NDL 18GX1X1/2 (RX/OR ONLY) (NEEDLE) IMPLANT
NDL FILTER BLUNT 18X1 1/2 (NEEDLE) IMPLANT
NDL HYPO 25GX1X1/2 BEV (NEEDLE) ×2 IMPLANT
NEEDLE 18GX1X1/2 (RX/OR ONLY) (NEEDLE) ×2 IMPLANT
NEEDLE FILTER BLUNT 18X1 1/2 (NEEDLE) ×2 IMPLANT
NEEDLE HYPO 25GX1X1/2 BEV (NEEDLE) ×4 IMPLANT
NS IRRIG 1000ML POUR BTL (IV SOLUTION) ×2 IMPLANT
PACK GENERAL/GYN (CUSTOM PROCEDURE TRAY) ×2 IMPLANT
PAD ARMBOARD 7.5X6 YLW CONV (MISCELLANEOUS) ×2 IMPLANT
PENCIL SMOKE EVACUATOR (MISCELLANEOUS) ×2 IMPLANT
RETRACTOR ONETRAX LX 90X20 (MISCELLANEOUS) IMPLANT
SPIKE FLUID TRANSFER (MISCELLANEOUS) ×2 IMPLANT
STRIP CLOSURE SKIN 1/2X4 (GAUZE/BANDAGES/DRESSINGS) ×2 IMPLANT
SUT MNCRL AB 4-0 PS2 18 (SUTURE) ×2 IMPLANT
SUT MON AB 5-0 PS2 18 (SUTURE) IMPLANT
SUT SILK 2 0 SH (SUTURE) IMPLANT
SUT VIC AB 2-0 SH 27 (SUTURE) ×8
SUT VIC AB 2-0 SH 27X BRD (SUTURE) IMPLANT
SUT VIC AB 2-0 SH 27XBRD (SUTURE) ×2 IMPLANT
SUT VIC AB 3-0 SH 27 (SUTURE) ×4
SUT VIC AB 3-0 SH 27X BRD (SUTURE) ×2 IMPLANT
SUT VIC AB 3-0 SH 27XBRD (SUTURE) ×2 IMPLANT
SYR CONTROL 10ML LL (SYRINGE) ×2 IMPLANT
TOWEL GREEN STERILE (TOWEL DISPOSABLE) ×2 IMPLANT
TOWEL GREEN STERILE FF (TOWEL DISPOSABLE) ×2 IMPLANT

## 2021-12-29 NOTE — H&P (Signed)
15 yof mom of Dr Adriana Mccallum presented after screening mm. No mass or dc. No prior breast cancer history. Family history in mgm and maternal aunt. She has critical AS with sob/chest pain as well as PMR on mtx and prednisone. She had a global asymmetry in the left breast. B density. This measured 8x4.4x4.7 cm and had 2 focal masses. She also had one abnormal ax node. Right was negative. 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 She also had mri that shows nl right breast, nl nodes and an 8x4.2x4.1 cm area on left side.she has had her tavr which she has done well with. Still getting her strength back. She is on letrozole. She is tolerating that. She was due to come in today to discuss breast cancer surgery. However she was admitted last week to [facility] with diverticulitis. I have some of the information available on care everywhere and. She had a CAT scan that shows mostly uncomplicated diverticulitis with a very questionable area of microperforation. She was treated with IV antibiotics and then was discharged home. She is on ciprofloxacin and Flagyl. She did have a bowel movement is passing flatus. Her abdomen does feel better although not completely back to normal. She does have a colonoscopy with Dr. Cristina Gong in the last several years that was normal. Her diverticluitis has resolved. She was noted to have small dissection after tavr on this imaging  that is asx and she is seeing cards for.  She underwent repeat mri that shows normal nodes and marked decrease in intensity and some in size of nme in central left breast.    Medical History: Past Medical History:  Diagnosis Date  GERD (gastroesophageal reflux disease)  Heart valve disease  History of cancer  Hypertension  Thyroid disease   Patient Active Problem List  Diagnosis  Coronary artery disease of native artery of native heart with stable angina  pectoris (CMS-HCC)  Polymyalgia rheumatica (CMS-HCC)  Severe aortic stenosis  Malignant neoplasm of lower-outer quadrant of left breast of female, estrogen receptor positive (CMS-HCC)   Past Surgical History:  Procedure Laterality Date  JOINT REPLACEMENT  LOBECTOMY PARTIAL THYROID  1967    Allergies  Allergen Reactions  Nut - Unspecified Unknown   Current Outpatient Medications on File Prior to Visit  Medication Sig Dispense Refill  acetaminophen (TYLENOL) 500 MG tablet Take by mouth  AMIOdarone (PACERONE) 200 MG tablet 1 tablet  amoxicillin (AMOXIL) 500 MG capsule amoxicillin 500 mg capsule take 4 tablets 30-60 minutes prior to dental work  apixaban (ELIQUIS) 2.5 mg tablet Take by mouth  BD SAFETYGLIDE SYRINGE 1 mL 27 gauge x 5/8" Syrg USE 1 WEEKLY AS DIRECTED.  calcium citrate (CALCITRATE) 200 mg (950 mg) tablet Take by mouth  cholecalciferol (VITAMIN D3) 1000 unit tablet Take by mouth  denosumab (PROLIA) 60 mg/mL inj syringe Inject subcutaneously  folic acid (FOLVITE) 1 MG tablet Take 2,000 mcg by mouth once daily  letrozole (FEMARA) 2.5 mg tablet  levothyroxine (SYNTHROID) 88 MCG tablet Take 88 mcg by mouth once daily  methotrexate (RHEUMATREX) 2.5 MG tablet inject 1 ml  metoprolol succinate (TOPROL-XL) 25 MG XL tablet metoprolol succinate ER 25 mg tablet,extended release 24 hr  mometasone (ELOCON) 0.1 % ointment Apply topically to vulvar skin no more than twice weekly  nitroGLYcerin (NITROSTAT) 0.4 MG SL tablet Place 0.4 mg under the tongue  every 5 (five) minutes as needed  nystatin (MYCOSTATIN) 100,000 unit/gram cream APPLY TO THE AFFECTED AREA UNDER BREASTS TWICE DAILY FOR UP TO 7 DAYS  pantoprazole (PROTONIX) 40 MG DR tablet 1 tablet  predniSONE (DELTASONE) 1 MG tablet Take by mouth  telmisartan (MICARDIS) 20 MG tablet Take 1 tablet by mouth every evening  zolpidem (AMBIEN) 5 MG tablet TAKE 1 TABLET BY MOUTH DAILY AT BEDTIME AS NEEDED FOR INSOMNIA   Family History   Problem Relation Age of Onset  Coronary Artery Disease (Blocked arteries around heart) Brother    Social History   Tobacco Use  Smoking Status Never  Smokeless Tobacco Never  Marital status: Widowed  Tobacco Use  Smoking status: Never  Smokeless tobacco: Never  Substance and Sexual Activity  Alcohol use: Yes  Drug use: Not Currently   Objective:    Physical Exam Vitals reviewed.  Constitutional:  Appearance: Normal appearance.  Abdominal:  General: There is no distension.  Palpations: Abdomen is soft.  Tenderness: There is abdominal tenderness (very mild llq).  Neurological:  Mental Status: She is alert.   Assessment and Plan:   Malignant neoplasm of lower-outer quadrant of left breast of female, estrogen receptor positive (CMS-HCC)  Left breast seed bracketed lumpectomy (after additional mr clip placements), left ax TAD  . She has been cleared by cardiology to do that at this point. I do think it is reasonable to obtain another MRI.we had long discussion about attempt at bracketed lumpectomy vs mastectomy. Understands there is higher than normal pos margin risk which could necessitate a mastectomy. She will still need a targeted lymph node dissection

## 2021-12-29 NOTE — Anesthesia Postprocedure Evaluation (Signed)
Anesthesia Post Note  Patient: Marissa Powers  Procedure(s) Performed: BREAST LUMPECTOMY WITH RADIOACTIVE SEED LOCALIZATION X2 (Left: Breast) LEFT AXILLARY NODE SEED GUIDED EXCISION (Left: Axilla) LEFT AXILLARY SENTINEL NODE BIOPSY (Left: Axilla)     Patient location during evaluation: PACU Anesthesia Type: General Level of consciousness: awake and alert, patient cooperative and oriented Pain management: pain level controlled Vital Signs Assessment: post-procedure vital signs reviewed and stable Respiratory status: spontaneous breathing, nonlabored ventilation and respiratory function stable Cardiovascular status: blood pressure returned to baseline and stable Postop Assessment: no apparent nausea or vomiting Anesthetic complications: no   No notable events documented.  Last Vitals:  Vitals:   12/29/21 0945 12/29/21 1000  BP: (!) 152/82 (!) 153/77  Pulse: 71 77  Resp: 18 (!) 25  Temp:    SpO2: 94% 94%    Last Pain:  Vitals:   12/29/21 1000  TempSrc:   PainSc: 0-No pain                 Catalaya Garr,E. Markus Casten

## 2021-12-29 NOTE — Discharge Instructions (Signed)
[facility] Office Phone Number [phone]  POST OP INSTRUCTIONS Take 400 mg of ibuprofen every 8 hours or 650 mg tylenol every 6 hours for next 72 hours then as needed. Use ice several times daily also.  A prescription for pain medication may be given to you upon discharge.  Take your pain medication as prescribed, if needed.  If narcotic pain medicine is not needed, then you may take acetaminophen (Tylenol), naprosyn (Alleve) or ibuprofen (Advil) as needed. Take your usually prescribed medications unless otherwise directed If you need a refill on your pain medication, please contact your pharmacy.  They will contact our office to request authorization.  Prescriptions will not be filled after 5pm or on week-ends. You should eat very light the first 24 hours after surgery, such as soup, crackers, pudding, etc.  Resume your normal diet the day after surgery. Most patients will experience some swelling and bruising in the breast.  Ice packs and a good support bra will help.  Wear the breast binder provided or a sports bra for 72 hours day and night.  After that wear a sports bra during the day until you return to the office. Swelling and bruising can take several days to resolve.  It is common to experience some constipation if taking pain medication after surgery.  Increasing fluid intake and taking a stool softener will usually help or prevent this problem from occurring.  A mild laxative (Milk of Magnesia or Miralax) should be taken according to package directions if there are no bowel movements after 48 hours. I used skin glue on the incision, you may shower in 24 hours.  The glue will flake off over the next 2-3 weeks.  Any sutures or staples will be removed at the office during your follow-up visit. ACTIVITIES:  You may resume regular daily activities (gradually increasing) beginning the next day.  Wearing a good support bra or sports bra minimizes pain and swelling.  You may have  sexual intercourse when it is comfortable. You may drive when you no longer are taking prescription pain medication, you can comfortably wear a seatbelt, and you can safely maneuver your car and apply brakes. RETURN TO WORK:  ______________________________________________________________________________________ You should see your doctor in the office for a follow-up appointment approximately two weeks after your surgery.  Your doctor's nurse will typically make your follow-up appointment when she calls you with your pathology report.  Expect your pathology report 3-4 business days after your surgery.  You may call to check if you do not hear from us after three days. OTHER INSTRUCTIONS: _______________________________________________________________________________________________ _____________________________________________________________________________________________________________________________________ _____________________________________________________________________________________________________________________________________ _____________________________________________________________________________________________________________________________________  WHEN TO CALL DR Donal Lynam: Fever over 101.0 Nausea and/or vomiting. Extreme swelling or bruising. Continued bleeding from incision. Increased pain, redness, or drainage from the incision.  The clinic staff is available to answer your questions during regular business hours.  Please don't hesitate to call and ask to speak to one of the nurses for clinical concerns.  If you have a medical emergency, go to the nearest emergency room or call 911.  A surgeon from [facility] is always on call at the hospital.  For further questions, please visit [email or web address]  

## 2021-12-29 NOTE — Transfer of Care (Signed)
Immediate Anesthesia Transfer of Care Note  Patient: Marissa Powers  Procedure(s) Performed: BREAST LUMPECTOMY WITH RADIOACTIVE SEED LOCALIZATION X2 (Left: Breast) LEFT AXILLARY NODE SEED GUIDED EXCISION (Left: Axilla) LEFT AXILLARY SENTINEL NODE BIOPSY (Left: Axilla)  Patient Location: PACU  Anesthesia Type:General  Level of Consciousness: awake, alert  and oriented  Airway & Oxygen Therapy: Patient Spontanous Breathing and Patient connected to face mask oxygen  Post-op Assessment: Report given to RN and Post -op Vital signs reviewed and stable  Post vital signs: Reviewed and stable  Last Vitals:  Vitals Value Taken Time  BP 149/76 12/29/21 0930  Temp 36.3 C 12/29/21 0928  Pulse 81 12/29/21 0930  Resp 26 12/29/21 0930  SpO2 97 % 12/29/21 0930  Vitals shown include unvalidated device data.  Last Pain:  Vitals:   12/29/21 0626  TempSrc:   PainSc: 0-No pain         Complications: No notable events documented.

## 2021-12-29 NOTE — Anesthesia Procedure Notes (Signed)
Anesthesia Regional Block: Pectoralis block   Pre-Anesthetic Checklist: , timeout performed,  Correct Patient, Correct Site, Correct Laterality,  Correct Procedure, Correct Position, site marked,  Risks and benefits discussed,  Surgical consent,  Pre-op evaluation,  At surgeon's request and post-op pain management  Laterality: Left  Prep: chloraprep       Needles:  Injection technique: Single-shot  Needle Type: Echogenic Needle     Needle Length: 9cm  Needle Gauge: 21     Additional Needles:   Procedures:,,,, ultrasound used (permanent image in chart),,    Narrative:  Start time: 12/29/2021 7:02 AM End time: 12/29/2021 7:09 AM Injection made incrementally with aspirations every 5 mL.  Performed by: Personally  Anesthesiologist: Annye Asa, MD  Additional Notes: Pt identified in Holding room.  Monitors applied. Working IV access confirmed. Sterile prep L clavicle and pec.  #21ga ECHOgenic Arrow block needle between serratus and pec minor, then pec minor and pec major, rib 4.  Total 30cc 0.5% Bupivacaine 1:200k epi injected incrementally after negative test dose.  Patient asymptomatic, VSS, no heme aspirated, tolerated well.   Jenita Seashore, MD

## 2021-12-29 NOTE — Op Note (Signed)
Preoperative diagnosis: Clinical stage II left breast cancer status post primary endocrine therapy Postoperative diagnosis: Same as above Procedure: 1.  Left breast radioactive seed bracketed lumpectomy 2.  Left axillary node radioactive seed guided excision 3.  Left deep axillary sentinel lymph node biopsy 4.  Injection of mag trace and blue dye for sentinel lymph node identification Surgeon: Dr. Serita Grammes Anesthesia: General with a pectoral block Estimated blood loss: Minimal Complications: None Drains: None Specimens: 1.  Left breast containing 3 clips and 2 seeds marked with paint 2.  Additional medial, superior, lateral, inferior, posterior margins all marked short superior, long lateral, double deep 3.  Left deep axillary sentinel lymph node containing a radioactive seed 4.  Additional deep left axillary sentinel lymph nodes Sponge needle count was correct completion Disposition recovery stable condition  Indications: This is an 81 year old female who had a screening mammogram with a global asymmetry in the left breast.  This measured 8 x 4.4 x 4.7 cm.  There were 2 focal masses she also had 1 abnormal axillary node.  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  On the MRI she had an 8 x 4.2 x 4.1 cm area on the left side.  She had a critical aortic stenosis at that point and underwent a TAVR which she has done well for a long period she had an episode of diverticulitis that she was admitted for that has gotten better.  She underwent a repeat MRI that shows a significantly decreased intensity and size of the non-mass enhancement in the central left breast as well as the nodes.  We discussed options and elected to try to proceed with a lumpectomy and targeted axillary node dissection.  Procedure: After informed consent was obtained she first underwent a pectoral block.  I injected 2 cc of mag trace in preop in the  subareolar position.  She was given antibiotics.  SCDs were placed.  She was then brought to the operating room placed under general anesthesia without complication.  She was prepped and draped in the standard sterile surgical fashion.  Surgical timeout was then performed.  I had her mammograms available for review in the operating room.  The seeds went from 3:00 medial to the areola to 9:00, or about 8 cm apart.  I made a lateral incision and I used the neoprobe to identify both seeds.  I removed both seeds and the surrounding tissue in between them.  This was all in removed and marked with paint.  Mammogram confirmed removal of 3 clips and 2 seeds.  I then proceeded to remove all of the margins except for the anterior margin.  These were marked as above.  I obtained hemostasis.  I then placed some clips in the cavity and mobilized the tissue and brought all of this together with multiple 2-0 Vicryl sutures.  The incision was eventually closed with 3-0 Vicryl and 4-0 Monocryl.  Steri-Strips were applied.  I then identified the radioactive seed in the low axilla.  I made a incision below the axillary hairline and dissected to this.  I remove the radioactive seed and the node involved with it.  It look like there were couple other small nodes attached to it.  This was also a sentinel node.  Mammogram confirmed removal of the seed and the clip.  There were some additional sentinel lymph nodes that had activity with mag trace that I removed.  There was  no additional activity or any abnormal nodes upon completion.  I did place some Arista in this cavity.  I then closed this with 2-0 Vicryl, 3-0 Vicryl, 4 Monocryl.  Glue Steri-Strips applied.  A binder was placed.  She tolerated this well was extubated and transferred to recovery stable.

## 2021-12-29 NOTE — Anesthesia Procedure Notes (Signed)
Procedure Name: Intubation Date/Time: 12/29/2021 8:01 AM  Performed by: Genelle Bal, CRNAPre-anesthesia Checklist: Patient identified, Emergency Drugs available, Suction available and Patient being monitored Patient Re-evaluated:Patient Re-evaluated prior to induction Oxygen Delivery Method: Circle system utilized Preoxygenation: Pre-oxygenation with 100% oxygen Induction Type: IV induction Ventilation: Mask ventilation without difficulty Laryngoscope Size: Miller and 2 Grade View: Grade III Tube type: Oral Tube size: 7.0 mm Number of attempts: 1 Airway Equipment and Method: Stylet Placement Confirmation: ETT inserted through vocal cords under direct vision, positive ETCO2 and breath sounds checked- equal and bilateral Secured at: 22 cm Tube secured with: Tape Dental Injury: Teeth and Oropharynx as per pre-operative assessment

## 2021-12-29 NOTE — Interval H&P Note (Signed)
History and Physical Interval Note:  12/29/2021 7:11 AM  Marissa Powers  has presented today for surgery, with the diagnosis of LEFT BREAST CANCER.  The various methods of treatment have been discussed with the patient and family. After consideration of risks, benefits and other options for treatment, the patient has consented to  Procedure(s): BREAST LUMPECTOMY WITH RADIOACTIVE SEED LOCALIZATION X2 (Left) LEFT AXILLARY NODE SEED GUIDED EXCISION (Left) LEFT AXILLARY SENTINEL NODE BIOPSY (Left) as a surgical intervention.  The patient's history has been reviewed, patient examined, no change in status, stable for surgery.  I have reviewed the patient's chart and labs.  Questions were answered to the patient's satisfaction.     Rolm Bookbinder

## 2021-12-30 ENCOUNTER — Encounter (HOSPITAL_COMMUNITY): Payer: Self-pay | Admitting: General Surgery

## 2022-01-02 LAB — SURGICAL PATHOLOGY

## 2022-01-03 NOTE — Progress Notes (Signed)
Patient Care Team: Crist Infante, MD as PCP - General (Internal Medicine) Thompson Grayer, MD as PCP - Electrophysiology (Cardiology) Belva Crome, MD as PCP - Cardiology (Cardiology) Rockwell Germany, RN as Oncology Nurse Navigator Mauro Kaufmann, RN as Oncology Nurse Navigator Nicholas Lose, MD as Consulting Physician (Hematology and Oncology) Rolm Bookbinder, MD as Consulting Physician (General Surgery)  DIAGNOSIS: No diagnosis found.  SUMMARY OF ONCOLOGIC HISTORY: Oncology History  Malignant neoplasm of lower-outer quadrant of left breast of female, estrogen receptor positive ([place])  07/18/2021 Initial Diagnosis   Left breast global asymmetry/density measuring 8 x 4.4 x 4.7 cm.  Ultrasound showed 2 focal masses.  3.1 cm: Biopsy grade 2 ILC ER 90%, PR 10%, HER2 equivocal, FISH negative, Ki-67 10%; lymph node biopsy: Positive Breast MRI abnormality left breast measured 8 x 4.2 x 4.1 cm    Genetic Testing   [facility] CancerNext Panel was Negative. 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 Report date is 09/11/2021.  The CancerNext gene panel offered by [employer] includes sequencing, rearrangement analysis, and RNA analysis for the following 36 genes:   APC, ATM, AXIN2, BARD1, BMPR1A, BRCA1, BRCA2, BRIP1, CDH1, CDK4, CDKN2A, CHEK2, DICER1, HOXB13, EPCAM, GREM1, MLH1, MSH2, MSH3, MSH6, MUTYH, NBN, NF1, NTHL1, PALB2, PMS2, POLD1, POLE, PTEN, RAD51C, RAD51D, RECQL, SMAD4, SMARCA4, STK11, and TP53.      CHIEF COMPLIANT: Follow-up post op  INTERVAL HISTORY: Marissa Powers is a 81 y.o. with the above mentioned. She presents to the clinic for a follow-up.    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  MEDICATIONS:  Current Outpatient Medications  Medication Sig Dispense Refill   acetaminophen (TYLENOL) 500 MG tablet Take 1,000 mg by mouth every 6 (six) hours as needed for moderate pain.     amiodarone (PACERONE) 200 MG tablet Take 1  tablet (200 mg total) by mouth daily. (Patient taking differently: Take 200 mg by mouth every evening.) 90 tablet 3   amoxicillin (AMOXIL) 500 MG capsule Take 4 capsules (2,000 mg total) by mouth See admin instructions. Take 4 capsules (2000 mg) by mouth 1 hour prior to dental appointments 12 capsule 12   apixaban (ELIQUIS) 5 MG TABS tablet Take 1 tablet (5 mg total) by mouth 2 (two) times daily. Resume Apixaban at 8 PM 06/19/2921 60 tablet 11   b complex vitamins capsule Take 1 capsule by mouth daily.     B-D TB SYRINGE 1CC/27GX1/2" 27G X 1/2" 1 ML MISC Inject into the skin once a week.     Calcium Carb-Cholecalciferol (CALCIUM + D3 PO) Take 1 tablet by mouth in the morning.     cholecalciferol (VITAMIN D3) 25 MCG (1000 UNIT) tablet Take 1,000 Units by mouth in the morning.     denosumab (PROLIA) 60 MG/ML SOSY injection Inject 60 mg into the skin every 6 (six) months.     dicyclomine (BENTYL) 10 MG capsule Take 10-20 mg by mouth 3 (three) times daily as needed for spasms.     folic acid (FOLVITE) 1 MG tablet Take 2 mg by mouth in the morning.     letrozole (FEMARA) 2.5 MG tablet Take 1 tablet (2.5 mg total) by mouth daily. (Patient taking differently: Take 2.5 mg by mouth every evening.) 90 tablet 3   levothyroxine (SYNTHROID) 100 MCG tablet Take 100 mcg by mouth daily before breakfast.     loperamide (IMODIUM) 2 MG capsule Take 2-4 mg by mouth 4 (four) times  daily as needed (upset stomach/diarrhea/loose stools).     methotrexate 50 MG/2ML injection Inject 25 mg into the skin every Wednesday.     metoprolol succinate (TOPROL XL) 25 MG 24 hr tablet Take 1 tablet (25 mg total) by mouth daily. 90 tablet 3   mometasone (ELOCON) 0.1 % ointment Apply topically to vulvar skin no more than twice weekly (Patient not taking: Reported on 12/22/2021) 45 g 1   nitroGLYCERIN (NITROSTAT) 0.4 MG SL tablet Place 1 tablet (0.4 mg total) under the tongue every 5 (five) minutes as needed for chest pain. 25 tablet 3    nystatin cream (MYCOSTATIN) Apply 1 application. topically 2 (two) times daily. Apply to affected area BID for up to 7 days to skin under breasts (Patient not taking: Reported on 12/22/2021) 30 g 1   REPATHA SURECLICK 017 MG/ML SOAJ Inject 140 mg into the skin every 14 (fourteen) days.     telmisartan (MICARDIS) 20 MG tablet Take 20 mg by mouth every evening.     traMADol (ULTRAM) 50 MG tablet Take 1 tablet (50 mg total) by mouth every 6 (six) hours as needed. 10 tablet 0   zolpidem (AMBIEN) 5 MG tablet Take 2.5 mg by mouth at bedtime.     No current facility-administered medications for this visit.    PHYSICAL EXAMINATION: ECOG PERFORMANCE STATUS: {CHL ONC ECOG [phone]}  There were no vitals filed for this visit. There were no vitals filed for this visit.  BREAST:*** No palpable masses or nodules in either right or left breasts. No palpable axillary supraclavicular or infraclavicular adenopathy no breast tenderness or nipple discharge. (exam performed in the presence of a chaperone)  LABORATORY DATA:  I have reviewed the data as listed    Latest Ref Rng & Units 12/26/2021   12:08 PM 08/20/2021    1:59 AM 08/19/2021   10:18 AM  CMP  Glucose 70 - 99 mg/dL 145  120  132   BUN 8 - 23 mg/dL '12  6  8   ' Creatinine 0.44 - 1.00 mg/dL 0.81  0.88  0.70   Sodium 135 - 145 mmol/L 141  139  144   Potassium 3.5 - 5.1 mmol/L 4.0  3.8  4.0   Chloride 98 - 111 mmol/L 109  112  110   CO2 22 - 32 mmol/L 21  23    Calcium 8.9 - 10.3 mg/dL 9.7  8.4      Lab Results  Component Value Date   WBC 6.6 12/25/2021   HGB 11.3 (L) 12/25/2021   HCT 34.9 (L) 12/25/2021   MCV 109.4 (H) 12/25/2021   PLT 219 12/25/2021   NEUTROABS 5.2 07/13/2021    ASSESSMENT & PLAN:  No problem-specific Assessment & Plan notes found for this encounter.    No orders of the defined types were placed in this encounter.  The patient has a good understanding of the overall plan. she agrees with it. she will call with  any problems that may develop before the next visit here. Total time spent: 30 mins including face to face time and time spent for planning, charting and co-ordination of care   Suzzette Righter, [place] 01/03/22    I Gardiner Coins am scribing for Dr. Lindi Adie  ***

## 2022-01-05 ENCOUNTER — Encounter: Payer: Self-pay | Admitting: *Deleted

## 2022-01-07 ENCOUNTER — Inpatient Hospital Stay: Payer: Medicare Other | Attending: Hematology and Oncology | Admitting: Hematology and Oncology

## 2022-01-07 ENCOUNTER — Encounter: Payer: Self-pay | Admitting: *Deleted

## 2022-01-07 DIAGNOSIS — Z170 Estrogen receptor positive status [ER+]: Secondary | ICD-10-CM | POA: Diagnosis not present

## 2022-01-07 DIAGNOSIS — Z79811 Long term (current) use of aromatase inhibitors: Secondary | ICD-10-CM | POA: Diagnosis not present

## 2022-01-07 DIAGNOSIS — Z79899 Other long term (current) drug therapy: Secondary | ICD-10-CM | POA: Insufficient documentation

## 2022-01-07 DIAGNOSIS — C50512 Malignant neoplasm of lower-outer quadrant of left female breast: Secondary | ICD-10-CM | POA: Insufficient documentation

## 2022-01-07 NOTE — Assessment & Plan Note (Signed)
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 I discussed the margins as well as lymph node surgeries. We also discussed the final staging along with previously performed ER/PR and HER-2/neu testing.  Treatment plan: 1.  Adjuvant radiation therapy 2. antiestrogen therapy  Return to clinic after radiation is complete

## 2022-01-12 ENCOUNTER — Ambulatory Visit
Admission: RE | Admit: 2022-01-12 | Discharge: 2022-01-12 | Disposition: A | Discharge status: Home / Self Care | Payer: Self-pay | Source: Ambulatory Visit | Attending: Radiation Oncology | Admitting: Radiation Oncology

## 2022-01-12 ENCOUNTER — Inpatient Hospital Stay
Admission: RE | Admit: 2022-01-12 | Discharge: 2022-01-12 | Disposition: A | Discharge status: Home / Self Care | Payer: Self-pay | Source: Ambulatory Visit | Attending: Radiation Oncology | Admitting: Radiation Oncology

## 2022-01-12 ENCOUNTER — Other Ambulatory Visit: Payer: Self-pay | Admitting: Radiation Oncology

## 2022-01-12 DIAGNOSIS — Z170 Estrogen receptor positive status [ER+]: Secondary | ICD-10-CM

## 2022-01-12 NOTE — Progress Notes (Signed)
Radiation Oncology         [phone] ________________________________  Name: Marissa Powers        MRN: [id number]  Date of Service: 01/15/2022 DOB: 1941-01-31  JM:EQASTM, Elta Guadeloupe, MD  Nicholas Lose, MD     REFERRING PHYSICIAN: Nicholas Lose, MD   DIAGNOSIS: The encounter diagnosis was Malignant neoplasm of lower-outer quadrant of left breast of female, estrogen receptor positive ([place]).   HISTORY OF PRESENT ILLNESS: Marissa Powers is a 81 y.o. female seen at the request of Dr. Lindi Adie for left breast cancer. The patient was originally found to have a global asymmetry in the left breast by screening mammogram in March 2023.  Further work-up including ultrasound on 07/16/2021 identified an irregular mass in the left breast in the lower outer aspect of the breast measuring up to 3.1 cm.  There was a suspicious appearing left axillary tail lymph node with cortical thickening.  She underwent a biopsy of the left breast at 3:00 on 07/18/2021 which showed grade 2 invasive lobular carcinoma.  Her axillary lymph node also contained metastatic carcinoma.  Her cancer was ER/PR positive HER2 negative with a Ki-67 of 10%.  She underwent an MRI of bilateral breasts on 07/23/2021 which showed a significantly larger area of clumped Noss non-mass enhancement measuring up to 7.8 cm.  No abnormalities were noted in the right breast.  No enlarged lymph nodes were seen despite the proven metastatic cancer on the left.  She was counseled on the rationale for neoadjuvant antihormone therapy and began letrozole.  She had repeat MRI on 12/03/2021 which showed a marked decrease in the density of the discontiguous clumped non-mass enhancement in the anterior central left breast measuring up to 6.2 cm.  As the patient desired lumpectomy, she underwent MRI clip placement to outline the approximate extensive enhancement.  She was taken for lumpectomy x2 with sentinel lymph node biopsy on 12/29/2021.  Final pathology shows a grade 2  invasive pleomorphic lobular carcinoma measuring 7 cm in greatest dimension with negative margins.  Invasive carcinoma was 2.5 mm from the inferior margin, and LCIS was 1 mm from the inferior margin.  Angiolymphatic invasion was noted, 1 of 4 lymph nodes were positive.  Additional posterior inferior lateral medial and superior margin excisions were all submitted.  Within the inferior margin invasive pleomorphic lobular carcinoma and LCIS was noted but invasive tumor was 0.5 mm from the true inferior margin and 4 mm from the true inferior margin for in situ disease.  She is seen to discuss adjuvant radiotherapy and will continue letrozole with Dr. Lindi Adie.   PREVIOUS RADIATION THERAPY: No   PAST MEDICAL HISTORY:  Past Medical History:  Diagnosis Date   Arthritis    osteoarthritis   CAD (coronary artery disease)    Descending thoracic aortic dissection (HCC)    followed by Dr. 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 Angelena Form & Dr. Cyndia Bent .  Severe aortic stenosis        PAST SURGICAL HISTORY: Past Surgical History:  Procedure Laterality Date   AXILLARY SENTINEL NODE BIOPSY Left 12/29/2021   Procedure: LEFT AXILLARY SENTINEL NODE BIOPSY;  Surgeon: Rolm Bookbinder, MD;  Location: [facility];  Service: General;  Laterality: Left;   BREAST BIOPSY Bilateral 1970's   x2 one a side   BREAST LUMPECTOMY WITH RADIOACTIVE SEED LOCALIZATION Left 12/29/2021   Procedure: BREAST LUMPECTOMY WITH RADIOACTIVE SEED LOCALIZATION X2;  Surgeon:  Rolm Bookbinder, MD;  Location: [facility];  Service: General;  Laterality: Left;   CATARACT EXTRACTION     bilateral   implantable loop recorder removal  05/30/2021   MDT reveal LINQ removed by Dr Rayann Heman   INTRAOPERATIVE TRANSTHORACIC ECHOCARDIOGRAM N/A 08/19/2021   Procedure: INTRAOPERATIVE TRANSTHORACIC ECHOCARDIOGRAM;  Surgeon: Burnell Blanks, MD;  Location: [facility];  Service: Open Heart Surgery;  Laterality: N/A;   LOOP RECORDER INSERTION N/A 08/11/2017   Procedure: LOOP RECORDER INSERTION;  Surgeon: Thompson Grayer, MD;  Location: [facility];  Service: Cardiovascular;  Laterality: N/A;   RADIOACTIVE SEED GUIDED AXILLARY SENTINEL LYMPH NODE Left 12/29/2021   Procedure: LEFT AXILLARY NODE SEED GUIDED EXCISION;  Surgeon: Rolm Bookbinder, MD;  Location: [facility];  Service: General;  Laterality: Left;   RETINAL DETACHMENT SURGERY  2000   RIGHT/LEFT HEART CATH AND CORONARY ANGIOGRAPHY N/A 06/19/2021   Procedure: RIGHT/LEFT HEART CATH AND CORONARY ANGIOGRAPHY;  Surgeon: Belva Crome, MD;  Location: [facility];  Service: Cardiovascular;  Laterality: N/A;   THYROIDECTOMY  1967   TONSILLECTOMY     TOTAL KNEE ARTHROPLASTY  04/25/2012   Procedure: TOTAL KNEE BILATERAL;  Surgeon: Gearlean Alf, MD;  Location: [facility];  Service: Orthopedics;  Laterality: Bilateral;   TRANSCATHETER AORTIC VALVE REPLACEMENT, TRANSFEMORAL N/A 08/19/2021   Procedure: Transcatheter Aortic Valve Replacement, Transfemoral Using 23m Sapien 3 Ultra Edwards Valve;  Surgeon: MBurnell Blanks MD;  Location: [facility]  Service: Open Heart Surgery;  Laterality: N/A;     FAMILY HISTORY:  Family History  Problem Relation Age of Onset   Aortic aneurysm Mother    Clotting disorder Mother    Healthy Father    Heart disease Brother        stints   Breast cancer Maternal Aunt 59   Breast cancer Maternal Grandmother 69   Heart attack Maternal Grandmother      SOCIAL HISTORY:  reports that she has never  smoked. She has never been exposed to tobacco smoke. She has never used smokeless tobacco. She reports current alcohol use of about 4.0 - 6.0 standard drinks of alcohol per week. She reports that she does not use drugs.   ALLERGIES: Cashew nut oil   MEDICATIONS:  Current Outpatient Medications  Medication Sig Dispense Refill   acetaminophen (TYLENOL) 500 MG tablet Take 1,000 mg by mouth every 6 (six) hours as needed for moderate pain.     amiodarone (PACERONE) 200 MG tablet Take 1 tablet (200 mg total) by mouth daily. (Patient taking differently: Take 200 mg by mouth every evening.) 90 tablet 3   amoxicillin (AMOXIL) 500 MG capsule Take 4 capsules (2,000 mg total) by mouth See admin instructions. Take 4 capsules (2000 mg) by mouth 1 hour prior to dental appointments 12 capsule 12   apixaban (ELIQUIS) 5 MG TABS tablet Take 1 tablet (5 mg total) by mouth 2 (two) times daily. Resume Apixaban at 8 PM 06/19/2921 60 tablet 11   b  complex vitamins capsule Take 1 capsule by mouth daily.     B-D TB SYRINGE 1CC/27GX1/2" 27G X 1/2" 1 ML MISC Inject into the skin once a week.     Calcium Carb-Cholecalciferol (CALCIUM + D3 PO) Take 1 tablet by mouth in the morning.     cholecalciferol (VITAMIN D3) 25 MCG (1000 UNIT) tablet Take 1,000 Units by mouth in the morning.     denosumab (PROLIA) 60 MG/ML SOSY injection Inject 60 mg into the skin every 6 (six) months.     dicyclomine (BENTYL) 10 MG capsule Take 10-20 mg by mouth 3 (three) times daily as needed for spasms.     folic acid (FOLVITE) 1 MG tablet Take 2 mg by mouth in the morning.     letrozole (FEMARA) 2.5 MG tablet Take 1 tablet (2.5 mg total) by mouth daily. (Patient taking differently: Take 2.5 mg by mouth every evening.) 90 tablet 3   levothyroxine (SYNTHROID) 100 MCG tablet Take 100 mcg by mouth daily before breakfast.     loperamide (IMODIUM) 2 MG capsule Take 2-4 mg by mouth 4 (four) times daily as needed (upset stomach/diarrhea/loose stools).      methotrexate 50 MG/2ML injection Inject 25 mg into the skin every Wednesday.     metoprolol succinate (TOPROL XL) 25 MG 24 hr tablet Take 1 tablet (25 mg total) by mouth daily. 90 tablet 3   mometasone (ELOCON) 0.1 % ointment Apply topically to vulvar skin no more than twice weekly (Patient not taking: Reported on 12/22/2021) 45 g 1   nitroGLYCERIN (NITROSTAT) 0.4 MG SL tablet Place 1 tablet (0.4 mg total) under the tongue every 5 (five) minutes as needed for chest pain. 25 tablet 3   nystatin cream (MYCOSTATIN) Apply 1 application. topically 2 (two) times daily. Apply to affected area BID for up to 7 days to skin under breasts (Patient not taking: Reported on 12/22/2021) 30 g 1   REPATHA SURECLICK 734 MG/ML SOAJ Inject 140 mg into the skin every 14 (fourteen) days.     telmisartan (MICARDIS) 20 MG tablet Take 20 mg by mouth every evening.     traMADol (ULTRAM) 50 MG tablet Take 1 tablet (50 mg total) by mouth every 6 (six) hours as needed. 10 tablet 0   zolpidem (AMBIEN) 5 MG tablet Take 2.5 mg by mouth at bedtime.     No current facility-administered medications for this visit.     REVIEW OF SYSTEMS: On review of systems, the patient reports that she is doing well overall. She has been tolerating antiestrogen therapy. She feels like she has fullness and discomfort in her lumpectomy site, but no opening of her incision. The area is firm and she is wearing her compression bra regularly with an icepack against this area of her skin. She reports she recently tapered off prednisone with the guidance of Dr. Amil Amen her Rheumatologist. She is currently taking methotrexate for her polymyalgia rheumatica. No other complaints are verbalized.      PHYSICAL EXAM:  Wt Readings from Last 3 Encounters:  01/07/22 170 lb (77.1 kg)  12/25/21 173 lb 4.8 oz (78.6 kg)  12/03/21 174 lb (78.9 kg)   Temp Readings from Last 3 Encounters:  01/07/22 (!) 97.2 F (36.2 C) (Temporal)  12/29/21 (!) 97.5 F (36.4 C)   12/25/21 97.8 F (36.6 C) (Oral)   BP Readings from Last 3 Encounters:  01/07/22 (!) 147/81  12/29/21 (!) 152/79  12/25/21 (!) 147/84   Pulse Readings from Last 3  Encounters:  01/07/22 69  12/29/21 75  12/25/21 (!) 54    In general this is a well appearing caucasian female who appears younger than her stated age in no acute distress. She's alert and oriented x4 and appropriate throughout the examination. Cardiopulmonary assessment is negative for acute distress and she exhibits normal effort. Her left breast reveals a well healed surgical incision site as well as a well healed axillary incision. Both have intact steri strips. Above the lumpectomy scar, there is induration of her surgical cavity, but fullness that is firm, and is about 4 cm in dimension. It is not fluctuant. There is mild erythema but no palpable warmth noted.      ECOG = 1  0 - Asymptomatic (Fully active, able to carry on all predisease activities without restriction)  1 - Symptomatic but completely ambulatory (Restricted in physically strenuous activity but ambulatory and able to carry out work of a light or sedentary nature. For example, light housework, office work)  2 - Symptomatic, <50% in bed during the day (Ambulatory and capable of all self care but unable to carry out any work activities. Up and about more than 50% of waking hours)  3 - Symptomatic, >50% in bed, but not bedbound (Capable of only limited self-care, confined to bed or chair 50% or more of waking hours)  4 - Bedbound (Completely disabled. Cannot carry on any self-care. Totally confined to bed or chair)  5 - Death   Eustace Pen MM, Creech RH, Tormey DC, et al. [phone]). "Toxicity and response criteria of the [facility] [employer]". [place] Oncol. 5 (6): 649-55    LABORATORY DATA:  Lab Results  Component Value Date   WBC 6.6 12/25/2021   HGB 11.3 (L) 12/25/2021   HCT 34.9 (L) 12/25/2021   MCV 109.4 (H) 12/25/2021   PLT 219  12/25/2021   Lab Results  Component Value Date   NA 141 12/26/2021   K 4.0 12/26/2021   CL 109 12/26/2021   CO2 21 (L) 12/26/2021   Lab Results  Component Value Date   ALT 14 08/15/2021   AST 21 08/15/2021   ALKPHOS 48 08/15/2021   BILITOT 1.0 08/15/2021      RADIOGRAPHY: MM CLIP PLACEMENT LEFT  Result Date: 12/24/2021 CLINICAL DATA:  Status post MR guided clip placements into the left breast to approximate the extent of enhancement in the left breast. EXAM: 3D DIAGNOSTIC LEFT MAMMOGRAM POST MRI BIOPSY COMPARISON:  Previous exam(s). FINDINGS: 3D Mammographic images were obtained following MR guided clip placement into the left breast. There is a cylindrical shaped clip in the posteromedial aspect of the breast and a barbell shaped clip in the anterior medial aspect of the left breast. The clips are located 6.8 cm apart. IMPRESSION: Appropriate positioning of the cylindrical and barbell shaped clips in the left breast. Final Assessment: Post Procedure Mammograms for Marker Placement Electronically Signed   By: Lillia Mountain M.D.   On: 12/24/2021 10:59  MR LT PLC BREAST LOC DEV   1ST LESION  INC MR GUIDE  Result Date: 12/24/2021 CLINICAL DATA:  81 year old female with known left breast cancer. Patient has a large area of enhancement in the left breast previously measured at 7.8 cm. Dr. Donne Hazel request that 2 clips be placed with MR guidance to try to outline the approximate extent of enhancement. The patient will have radioactive seed placed at [place] at the site of all 3 clips in the left breast. EXAM: MRI  GUIDED CLIP PLACEMENTSLEFT BREAST TECHNIQUE: Multiplanar, multisequence MR imaging of the LEFT breast was performed both before and after administration of intravenous contrast. CONTRAST:  7 ML GADAVIST COMPARISON:  Previous exam(s). FINDINGS: I met with the patient, and we discussed the procedure of MRI guided biopsy, including risks, benefits, and alternatives. Specifically,  we discussed the risks of infection, bleeding, tissue injury and clip migration. Informed, written consent was given. The usual time out protocol was performed immediately prior to the procedure. Using sterile technique, 1% Lidocaine, MRI guidance, a cylindrical shaped clip was placed into the posterior lateral aspect of the left breast and a barbell clip was placed into the anteromedial aspect of the breast. Clips were placed at the posterior lateral aspect of the enhancement and in the anteromedial aspect of the enhancement. The clips are located 6.9 cm apart. IMPRESSION: MRI guided clip placements into the left breast. No apparent complications. Electronically Signed   By: Lillia Mountain M.D.   On: 12/24/2021 10:34  MR LT PLC BREAST LOC DEV   EA ADD LESION  INC MR GUIDE  Result Date: 12/24/2021 CLINICAL DATA:  81 year old female with known left breast cancer. Patient has a large area of enhancement in the left breast previously measured at 7.8 cm. Dr. Donne Hazel request that 2 clips be placed with MR guidance to try to outline the approximate extent of enhancement. The patient will have radioactive seed placed at [place] at the site of all 3 clips in the left breast. EXAM: MRI GUIDED CLIP PLACEMENTSLEFT BREAST TECHNIQUE: Multiplanar, multisequence MR imaging of the LEFT breast was performed both before and after administration of intravenous contrast. CONTRAST:  7 ML GADAVIST COMPARISON:  Previous exam(s). FINDINGS: I met with the patient, and we discussed the procedure of MRI guided biopsy, including risks, benefits, and alternatives. Specifically, we discussed the risks of infection, bleeding, tissue injury and clip migration. Informed, written consent was given. The usual time out protocol was performed immediately prior to the procedure. Using sterile technique, 1% Lidocaine, MRI guidance, a cylindrical shaped clip was placed into the posterior lateral aspect of the left breast and a barbell clip  was placed into the anteromedial aspect of the breast. Clips were placed at the posterior lateral aspect of the enhancement and in the anteromedial aspect of the enhancement. The clips are located 6.9 cm apart. IMPRESSION: MRI guided clip placements into the left breast. No apparent complications. Electronically Signed   By: Lillia Mountain M.D.   On: 12/24/2021 10:34      IMPRESSION/PLAN: 1. Stage IB, cT3N1M0, grade 2 invasive lobular carcinoma of the left breast. Dr. Lisbeth Renshaw discusses the pathology findings and reviews the nature of node positive breast disease.  She has done well since completing surgery.  Dr. Lisbeth Renshaw discusses the rationale for external radiotherapy to the breast and regional lymph nodes to reduce risks of local recurrence and continuation of antiestrogen therapy as outlined by Dr. Lindi Adie. We discussed the risks, benefits, short, and long term effects of radiotherapy, as well as the curative intent, and the patient is interested in proceeding. Dr. Lisbeth Renshaw discusses the delivery and logistics of radiotherapy and anticipates a course of 6 1/2 weeks of radiotherapy to the left breast and regional lymph nodes with deep inspiration breath-hold technique. She has what appears to be a hematoma. We will follow up with Dr. Cristal Generous assessment, and evaluation as well with physical therapy and would anticipate starting treatment no sooner than the week of 01/26/22. Written consent is obtained  and placed in the chart, a copy was provided to the patient. The patient will be contacted to coordinate treatment planning by our simulation department.  2. Polymyalgia Rheumatica. Dr. Lisbeth Renshaw recommends discontinuing methotrexate the week prior to radiotherapy, during, and hold until after completing radiation due to risks of increased toxicity when radiation is administered with concurrent methotrexate use. We will reach out to Dr. Amil Amen as she may need to consider resuming prednisone.  In a visit lasting 60  minutes, greater than 50% of the time was spent face to face reviewing her case, as well as in preparation of, discussing, and coordinating the patient's care.  The above documentation reflects my direct findings during this shared patient visit. Please see the separate note by Dr. Lisbeth Renshaw on this date for the remainder of the patient's plan of care.    Carola Rhine, [facility]   **Disclaimer: This note was dictated with voice recognition software. Similar sounding words can inadvertently be transcribed and this note may contain transcription errors which may not have been corrected upon publication of note.**

## 2022-01-13 ENCOUNTER — Encounter: Payer: Self-pay | Admitting: *Deleted

## 2022-01-13 NOTE — Progress Notes (Signed)
New Breast Cancer Diagnosis: Left Breast LOQ  Did patient present with symptoms (if so, please note symptoms) or screening mammography?: Screening mammogram in March 2023 showed global asymmetry in the left breast.  Irregular mass was identified by Ultrasound in the lower outer breast.  MRI Breast 12/03/2021: Marked decrease in the density of the discontiguous clumped non-mass enhancement in the anterior central left breast measuring up to 6.2 cm.  MRI Bilateral Breast 07/23/2021: There is a significantly larger area of clumped non mass enhancement and nodular enhancement in the left breast surrounding the post biopsy marker clip, consistent with more extensive invasive and in-situ breast carcinoma. This spans a total of 7.8 x 4.1 x 5.1 cm.  No evidence of right breast carcinoma.  No enlarged or abnormal appearing lymph nodes, despite biopsy-proven metastatic lymphadenopathy on the left   Location and Extent of disease :left breast. Located at 3 o'clock position, measured 7 cm in greatest dimension. Adenopathy yes, axillary tail lymph node with cortical thickening.  Histology per Pathology Report: grade 2, Invasive Lobular Carcinoma   Receptor Status: ER(positive), PR (positive), Her2-neu (negative), Ki-(10%)   Surgeon and surgical plan, if any:  Dr. Ninfa Linden Left Breast Lumpectomy with radioactive seed localization X2, Left axillary node seed guided excision 12/29/2021 -Follow-up next week   Medical oncologist, treatment if any:   Dr. Lindi Adie 01/07/2022 Treatment plan: 1.  Adjuvant radiation therapy: We will refer her to Dr. Lisbeth Renshaw. 2. antiestrogen therapy: Started as neoadjuvant therapy with letrozole.  She tolerated it very well.  She will continue on with the same.   Family History of Breast/Ovarian/Prostate Cancer: Maternal Aunt and Maternal Grandmother had breast cancer.    Lymphedema issues, if any: No     Pain issues, if any: Discomfort in her breast since surgery.  SAFETY  ISSUES: Prior radiation? No Pacemaker/ICD? No Possible current pregnancy? Postmenopausal Is the patient on methotrexate? Yes, Injections weekly, managed by Dr. Marijean Bravo.  Current Complaints / other details:

## 2022-01-15 ENCOUNTER — Ambulatory Visit
Admission: RE | Admit: 2022-01-15 | Discharge: 2022-01-15 | Disposition: A | Discharge status: Home / Self Care | Payer: Medicare Other | Source: Ambulatory Visit | Attending: Radiation Oncology | Admitting: Radiation Oncology

## 2022-01-15 ENCOUNTER — Other Ambulatory Visit: Payer: Self-pay

## 2022-01-15 ENCOUNTER — Encounter: Payer: Self-pay | Admitting: Radiation Oncology

## 2022-01-15 VITALS — BP 173/88 | HR 54 | Temp 97.9°F | Resp 20 | Ht 65.5 in | Wt 171.2 lb

## 2022-01-15 DIAGNOSIS — M353 Polymyalgia rheumatica: Secondary | ICD-10-CM | POA: Insufficient documentation

## 2022-01-15 DIAGNOSIS — I10 Essential (primary) hypertension: Secondary | ICD-10-CM | POA: Diagnosis not present

## 2022-01-15 DIAGNOSIS — I2510 Atherosclerotic heart disease of native coronary artery without angina pectoris: Secondary | ICD-10-CM | POA: Insufficient documentation

## 2022-01-15 DIAGNOSIS — Z79811 Long term (current) use of aromatase inhibitors: Secondary | ICD-10-CM | POA: Insufficient documentation

## 2022-01-15 DIAGNOSIS — Z79899 Other long term (current) drug therapy: Secondary | ICD-10-CM | POA: Insufficient documentation

## 2022-01-15 DIAGNOSIS — K219 Gastro-esophageal reflux disease without esophagitis: Secondary | ICD-10-CM | POA: Insufficient documentation

## 2022-01-15 DIAGNOSIS — Z170 Estrogen receptor positive status [ER+]: Secondary | ICD-10-CM | POA: Insufficient documentation

## 2022-01-15 DIAGNOSIS — C50512 Malignant neoplasm of lower-outer quadrant of left female breast: Secondary | ICD-10-CM | POA: Diagnosis not present

## 2022-01-15 DIAGNOSIS — I480 Paroxysmal atrial fibrillation: Secondary | ICD-10-CM | POA: Insufficient documentation

## 2022-01-15 DIAGNOSIS — E7800 Pure hypercholesterolemia, unspecified: Secondary | ICD-10-CM | POA: Diagnosis not present

## 2022-01-15 DIAGNOSIS — M129 Arthropathy, unspecified: Secondary | ICD-10-CM | POA: Insufficient documentation

## 2022-01-15 DIAGNOSIS — Z79631 Long term (current) use of antimetabolite agent: Secondary | ICD-10-CM | POA: Insufficient documentation

## 2022-01-15 DIAGNOSIS — C50412 Malignant neoplasm of upper-outer quadrant of left female breast: Secondary | ICD-10-CM | POA: Diagnosis not present

## 2022-01-16 DIAGNOSIS — Z170 Estrogen receptor positive status [ER+]: Secondary | ICD-10-CM | POA: Diagnosis not present

## 2022-01-16 DIAGNOSIS — C50412 Malignant neoplasm of upper-outer quadrant of left female breast: Secondary | ICD-10-CM | POA: Diagnosis not present

## 2022-01-19 ENCOUNTER — Ambulatory Visit: Payer: Medicare Other | Admitting: Physical Therapy

## 2022-01-20 ENCOUNTER — Ambulatory Visit: Payer: Medicare Other

## 2022-01-20 ENCOUNTER — Encounter (HOSPITAL_COMMUNITY): Payer: Self-pay

## 2022-01-22 ENCOUNTER — Encounter: Payer: Self-pay | Admitting: *Deleted

## 2022-01-22 NOTE — Therapy (Signed)
OUTPATIENT PHYSICAL THERAPY BREAST CANCER POST OP FOLLOW UP   Patient Name: Marissa Powers MRN: [id number] DOB:07/11/1940, 81 y.o., female Today's Date: 01/23/2022   PT End of Session - 01/23/22 0903     Visit Number 2    Number of Visits 10    Date for PT Re-Evaluation 02/20/22    PT Start Time 0903    PT Stop Time 0950    PT Time Calculation (min) 47 min    Activity Tolerance Patient tolerated treatment well    Behavior During Therapy [facility] for tasks assessed/performed             Past Medical History:  Diagnosis Date   Arthritis    osteoarthritis   Breast cancer ([place]) 11/2021   CAD (coronary artery disease)    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 Angelena Form & Dr. Cyndia Bent .   Severe aortic stenosis    Past Surgical History:  Procedure Laterality Date   AXILLARY SENTINEL NODE BIOPSY Left 12/29/2021   Procedure: LEFT AXILLARY SENTINEL NODE BIOPSY;  Surgeon: Rolm Bookbinder, MD;  Location: [place];  Service: General;  Laterality: Left;   BREAST BIOPSY Bilateral 1970's   x2 one a side   BREAST LUMPECTOMY WITH RADIOACTIVE SEED LOCALIZATION Left 12/29/2021   Procedure: BREAST LUMPECTOMY WITH RADIOACTIVE SEED LOCALIZATION X2;  Surgeon: Rolm Bookbinder, MD;  Location: [place];  Service: General;  Laterality: Left;   CATARACT EXTRACTION     bilateral   implantable loop recorder removal  05/30/2021   MDT reveal LINQ removed by Dr  Rayann Heman   INTRAOPERATIVE TRANSTHORACIC ECHOCARDIOGRAM N/A 08/19/2021   Procedure: INTRAOPERATIVE TRANSTHORACIC ECHOCARDIOGRAM;  Surgeon: Burnell Blanks, MD;  Location: [facility];  Service: Open Heart Surgery;  Laterality: N/A;   LOOP RECORDER INSERTION N/A 08/11/2017   Procedure: LOOP RECORDER INSERTION;  Surgeon: Thompson Grayer, MD;  Location: [facility];  Service: Cardiovascular;  Laterality: N/A;   RADIOACTIVE SEED GUIDED AXILLARY SENTINEL LYMPH NODE Left 12/29/2021   Procedure: LEFT AXILLARY NODE SEED GUIDED EXCISION;  Surgeon: Rolm Bookbinder, MD;  Location: [place] [facility];  Service: General;  Laterality: Left;   RETINAL DETACHMENT SURGERY  2000   RIGHT/LEFT HEART CATH AND CORONARY ANGIOGRAPHY N/A 06/19/2021   Procedure: RIGHT/LEFT HEART CATH AND CORONARY ANGIOGRAPHY;  Surgeon: Belva Crome, MD;  Location: [facility];  Service: Cardiovascular;  Laterality: N/A;   THYROIDECTOMY  1967   TONSILLECTOMY     TOTAL KNEE ARTHROPLASTY  04/25/2012   Procedure: TOTAL KNEE BILATERAL;  Surgeon: Gearlean Alf, MD;  Location: [facility];  Service: Orthopedics;  Laterality: Bilateral;   TRANSCATHETER AORTIC VALVE REPLACEMENT, TRANSFEMORAL N/A 08/19/2021   Procedure: Transcatheter Aortic Valve Replacement, Transfemoral Using 50m Sapien 3 Ultra Edwards Valve;  Surgeon: MBurnell Blanks MD;  Location: [place]  Service:  Open Heart Surgery;  Laterality: N/A;   Patient Active Problem List   Diagnosis Date Noted   Genetic testing 09/12/2021   Family history of breast cancer 08/28/2021   Polymyalgia rheumatica ([place]) 08/19/2021   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   Senile osteoporosis 08/15/2021   Malignant neoplasm of lower-outer quadrant of left breast of female, estrogen receptor positive ([place]) 07/28/2021   Severe aortic stenosis 06/19/2021   Secondary hypercoagulable state ([place]) 08/28/2019   Pain in joint of left shoulder 09/20/2018   Coronary artery disease of  native artery of native heart with stable angina pectoris ([place]) 09/15/2018   Paroxysmal atrial fibrillation (HCC) 95/63/8756   Lichen sclerosus 43/32/9518    Class: History of   Hypertension    GERD (gastroesophageal reflux disease)    Arthritis    Hypercholesteremia    OA (osteoarthritis) of knee 04/25/2012   Fibroid 2002   Dysplasia of cervix, low grade (CIN 1) 1990    PCP: Crist Infante, MD  REFERRING PROVIDER: Rolm Bookbinder MD  REFERRING DIAG: Left Breast Cancer  THERAPY DIAG:  Malignant neoplasm of upper-outer quadrant of left breast in female, estrogen receptor positive ([place])  Abnormal posture  Rationale for Evaluation and Treatment Rehabilitation  ONSET DATE: 07/18/2021  SUBJECTIVE:                                                                                                                                                                                           SUBJECTIVE STATEMENT: Not doing great since surgery. I am having a lot pain in my axilla, and I had a PA look at it and she said it was a hematoma. Dr. Donne Hazel put me on an antibiotic and this is my third day. Even taking a step jars it. I have done all of the exercises and that is not a problem.  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 It measures 7.8 cm and is located in the upper outer quadrant. It is ER/PR positive and HER2 negative with a Ki67 of 10%. She has severe symptomatic aortic stenosis and is in the process of being considered for a TAVR procedure. 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  PAIN:  Are you having pain? Yes: NPRS scale: 0-8/10 Pain location: left axilla Pain description: achy, rubbed raw Aggravating factors: walking sometimes, Relieving factors: not sure  PRECAUTIONS: Recent Surgery, left UE Lymphedema risk, Aortic valve replacement 95/12/2021), polymyalgia rheumatica, Afib, descending thoracic aortic dissection  ACTIVITY LEVEL / LEISURE: walks dog   OBJECTIVE:   PATIENT SURVEYS:  QUICK DASH: 16%  OBSERVATIONS:  Steri strips still present over axillary and breast incisions. Firmness and significant tenderness noted in left axilla with significant redness inferior and medial to axilla not yet improved with antibiotic per pt. She has appt with Dr. Donne Hazel on Wed. (Photo in media)  POSTURE:  Forward head, rounded shoulders  LYMPHEDEMA ASSESSMENT:   UPPER EXTREMITY AROM/PROM:   A/PROM RIGHT  07/30/2021    Shoulder extension 46  Shoulder flexion 146  Shoulder abduction 162  Shoulder internal rotation 65  Shoulder external rotation 87                          (Blank rows = not tested)   A/PROM LEFT  07/30/2021 01/23/2022  Shoulder extension 54 54  Shoulder flexion 137 142  Shoulder abduction 161 168  Shoulder internal rotation 76 72  Shoulder external rotation 90 90                          (Blank rows = not tested)     CERVICAL AROM: All within normal limits:      Percent limited  Flexion WNL  Extension 25% limited  Right lateral flexion 50% limited  Left lateral flexion 50% limited  Right rotation 25% limited  Left rotation 50% limited        UPPER EXTREMITY STRENGTH: WFL     LYMPHEDEMA ASSESSMENTS:    LANDMARK RIGHT  07/30/2021 01/23/2022  10 cm proximal to olecranon process 30.3 29.7  Olecranon  process 25.4 24.8  10 cm proximal to ulnar styloid process 24 22.0  Just proximal to ulnar styloid process 16.4 16.3  Across hand at thumb web space 19 18.5  At base of 2nd digit 7.1 6.6  (Blank rows = not tested)   [facility] LEFT  07/30/2021 01/23/2022  10 cm proximal to olecranon process 29.7 28.5  Olecranon process 25.7 25.1  10 cm proximal to ulnar styloid process 22.7 21.5  Just proximal to ulnar styloid process 15.8 15.7  Across hand at thumb web space 19 18.2  At base of 2nd digit 7 6.5  (Blank rows = not tested)        Surgery type/Date: 12/29/2021 Left lumpectomy  Number of lymph nodes removed: 1/4 Current/past treatment (chemo, radiation, hormone therapy): Will have radiation and anti estrogens Other symptoms:  Heaviness/tightness Yes Pain Yes Pitting edema No Infections Yes Decreased scar mobility Yessteri strips still present Stemmer sign No   PATIENT EDUCATION:  Education details: ABC class, SOZO screens, scar massage when steri strips off and incisions healed, continue exs during/after radiation Person educated: Patient Education method: Explanation and Handouts Education comprehension: verbalized understanding   HOME EXERCISE PROGRAM:  Reviewed previously given post op HEP. Made foam pad to wear under compression bra in TG soft which made her more comfortable.   ASSESSMENT:  CLINICAL IMPRESSION: Pt is s/p left lumpectomy with 1/4 LN removed on 12/29/2021. Her surgery was delayed for her  to have aortic valve replacement on 08/19/2021. She is doing very well with her ROM and has restored ROM to WNL.  There are no concerns for lymphedema presently. Her incisions still have steri strips intact. There is a large area of significant redness distal and medial to the left axilla for which she is presently on antibiotics. Photo in media.She will follow up with Dr. Donne Hazel on Wednesday. She has no further PT needs from a ROM perspective but dates will be extended in case  MD refers her back in follow up.  Pt will benefit from skilled therapeutic intervention to improve on the following deficits: Decreased knowledge of precautions, impaired UE functional use, pain, decreased ROM, postural dysfunction.   PT treatment/interventions: ADL/Self care home management, Therapeutic exercises, Patient/Family education, Self Care, Manual lymph drainage, scar mobilization, and Manual therapy     GOALS: Goals reviewed with patient? Yes  LONG TERM GOALS:  (STG=LTG)  GOALS Name Target Date  Goal status  1 Pt will demonstrate she has regained full shoulder ROM and function post operatively compared to baselines.  Baseline: 01/23/2022 MET  _0 PLAN: PT FREQUENCY/DURATION: No further visits scheduled today  PLAN FOR NEXT SESSION: No PT needs identified presently unless MD sends pt back for follow up after infection clears. Goals to be made if pt returns.   [facility]  [address]  [place] [address]  [phone]  After Breast Cancer Class It is recommended you attend the ABC class to be educated on lymphedema risk reduction. This class is free of charge and lasts for 1 hour. It is a 1-time class. You will need to download the Webex app either on your phone or computer. We will send you a link the night before or the morning of the class. You should be able to click on that link to join the class. This is not a confidential class. You don't have to turn your camera on, but other participants may be able to see your email address.  Scar massage You can begin gentle scar massage to you incision sites. Gently place one hand on the incision and move the skin (without sliding on the skin) in various directions. Do this for a few minutes and then you can gently massage either coconut oil or vitamin E cream into the scars.  Compression garment You should continue wearing your compression bra until you feel like you  no longer have swelling.  Home exercise Program Continue doing the exercises you were given until you feel like you can do them without feeling any tightness at the end.   Walking Program Studies show that 30 minutes of walking per day (fast enough to elevate your heart rate) can significantly reduce the risk of a cancer recurrence. If you can't walk due to other medical reasons, we encourage you to find another activity you could do (like a stationary bike or water exercise).  Posture After breast cancer surgery, people frequently sit with rounded shoulders posture because it puts their incisions on slack and feels better. If you sit like this and scar tissue forms in that position, you can become very tight and have pain sitting or standing with good posture. Try to be aware of your posture and sit and stand up tall to heal properly.  Follow up PT: It is recommended you return every 3 months for the first  3 years following surgery to be assessed on the SOZO machine for an L-Dex score. This helps prevent clinically significant lymphedema in 95% of patients. These follow up screens are 10 minute appointments that you are not billed for.  Claris Pong, PT 01/23/2022, 12:12 PM

## 2022-01-23 ENCOUNTER — Ambulatory Visit: Payer: Medicare Other | Attending: General Surgery

## 2022-01-23 DIAGNOSIS — Z170 Estrogen receptor positive status [ER+]: Secondary | ICD-10-CM | POA: Insufficient documentation

## 2022-01-23 DIAGNOSIS — R293 Abnormal posture: Secondary | ICD-10-CM | POA: Insufficient documentation

## 2022-01-23 DIAGNOSIS — C50412 Malignant neoplasm of upper-outer quadrant of left female breast: Secondary | ICD-10-CM | POA: Insufficient documentation

## 2022-01-23 NOTE — Patient Instructions (Signed)
     [facility]  [address], [address]  [place] [address]  [phone]  After Breast Cancer Class It is recommended you attend the ABC class to be educated on lymphedema risk reduction. This class is free of charge and lasts for 1 hour. It is a 1-time class. You will need to download the Webex app either on your phone or computer. We will send you a link the night before or the morning of the class. You should be able to click on that link to join the class. This is not a confidential class. You don't have to turn your camera on, but other participants may be able to see your email address.  Scar massage You can begin gentle scar massage to you incision sites. Gently place one hand on the incision and move the skin (without sliding on the skin) in various directions. Do this for a few minutes and then you can gently massage either coconut oil or vitamin E cream into the scars.  Compression garment You should continue wearing your compression bra until you feel like you no longer have swelling.  Home exercise Program Continue doing the exercises you were given until you feel like you can do them without feeling any tightness at the end.   Walking Program Studies show that 30 minutes of walking per day (fast enough to elevate your heart rate) can significantly reduce the risk of a cancer recurrence. If you can't walk due to other medical reasons, we encourage you to find another activity you could do (like a stationary bike or water exercise).  Posture After breast cancer surgery, people frequently sit with rounded shoulders posture because it puts their incisions on slack and feels better. If you sit like this and scar tissue forms in that position, you can become very tight and have pain sitting or standing with good posture. Try to be aware of your posture and sit and stand up tall to heal properly.  Follow up PT: It is recommended you return every 3 months for  the first 2 years following surgery to be assessed on the SOZO machine for an L-Dex score. This helps prevent clinically significant lymphedema in 95% of patients. These follow up screens are 10 minute appointments that you are not billed for.  

## 2022-01-27 ENCOUNTER — Ambulatory Visit: Payer: Medicare Other | Admitting: Radiation Oncology

## 2022-01-29 ENCOUNTER — Ambulatory Visit
Admission: RE | Admit: 2022-01-29 | Discharge: 2022-01-29 | Disposition: A | Discharge status: Home / Self Care | Payer: Medicare Other | Source: Ambulatory Visit | Attending: Radiation Oncology | Admitting: Radiation Oncology

## 2022-01-29 ENCOUNTER — Other Ambulatory Visit: Payer: Self-pay

## 2022-01-29 DIAGNOSIS — C50512 Malignant neoplasm of lower-outer quadrant of left female breast: Secondary | ICD-10-CM | POA: Diagnosis not present

## 2022-01-29 DIAGNOSIS — Z510 Encounter for antineoplastic radiation therapy: Secondary | ICD-10-CM | POA: Diagnosis not present

## 2022-01-29 DIAGNOSIS — C50412 Malignant neoplasm of upper-outer quadrant of left female breast: Secondary | ICD-10-CM | POA: Diagnosis not present

## 2022-01-29 DIAGNOSIS — Z170 Estrogen receptor positive status [ER+]: Secondary | ICD-10-CM | POA: Diagnosis not present

## 2022-02-03 ENCOUNTER — Ambulatory Visit: Payer: Medicare Other | Admitting: Radiation Oncology

## 2022-02-03 ENCOUNTER — Encounter: Payer: Self-pay | Admitting: *Deleted

## 2022-02-04 ENCOUNTER — Ambulatory Visit: Payer: Medicare Other

## 2022-02-04 ENCOUNTER — Telehealth: Payer: Self-pay | Admitting: Hematology and Oncology

## 2022-02-04 DIAGNOSIS — Z170 Estrogen receptor positive status [ER+]: Secondary | ICD-10-CM | POA: Diagnosis not present

## 2022-02-04 DIAGNOSIS — Z510 Encounter for antineoplastic radiation therapy: Secondary | ICD-10-CM | POA: Diagnosis not present

## 2022-02-04 DIAGNOSIS — C50412 Malignant neoplasm of upper-outer quadrant of left female breast: Secondary | ICD-10-CM | POA: Diagnosis not present

## 2022-02-04 DIAGNOSIS — C50512 Malignant neoplasm of lower-outer quadrant of left female breast: Secondary | ICD-10-CM | POA: Diagnosis not present

## 2022-02-04 NOTE — Telephone Encounter (Signed)
Scheduled appointment per 10/24 scheduling message. Patient is aware. 

## 2022-02-05 ENCOUNTER — Other Ambulatory Visit: Payer: Self-pay

## 2022-02-05 ENCOUNTER — Ambulatory Visit
Admission: RE | Admit: 2022-02-05 | Discharge: 2022-02-05 | Disposition: A | Discharge status: Home / Self Care | Payer: Medicare Other | Source: Ambulatory Visit | Attending: Radiation Oncology | Admitting: Radiation Oncology

## 2022-02-05 DIAGNOSIS — C50412 Malignant neoplasm of upper-outer quadrant of left female breast: Secondary | ICD-10-CM | POA: Diagnosis not present

## 2022-02-05 DIAGNOSIS — Z170 Estrogen receptor positive status [ER+]: Secondary | ICD-10-CM | POA: Diagnosis not present

## 2022-02-05 DIAGNOSIS — C50512 Malignant neoplasm of lower-outer quadrant of left female breast: Secondary | ICD-10-CM | POA: Diagnosis not present

## 2022-02-05 DIAGNOSIS — Z510 Encounter for antineoplastic radiation therapy: Secondary | ICD-10-CM | POA: Diagnosis not present

## 2022-02-05 LAB — RAD ONC ARIA SESSION SUMMARY

## 2022-02-06 ENCOUNTER — Other Ambulatory Visit: Payer: Self-pay

## 2022-02-06 ENCOUNTER — Ambulatory Visit
Admission: RE | Admit: 2022-02-06 | Discharge: 2022-02-06 | Disposition: A | Discharge status: Home / Self Care | Payer: Medicare Other | Source: Ambulatory Visit | Attending: Radiation Oncology | Admitting: Radiation Oncology

## 2022-02-06 DIAGNOSIS — Z510 Encounter for antineoplastic radiation therapy: Secondary | ICD-10-CM | POA: Diagnosis not present

## 2022-02-06 DIAGNOSIS — C50412 Malignant neoplasm of upper-outer quadrant of left female breast: Secondary | ICD-10-CM | POA: Diagnosis not present

## 2022-02-06 DIAGNOSIS — Z170 Estrogen receptor positive status [ER+]: Secondary | ICD-10-CM

## 2022-02-06 DIAGNOSIS — C50512 Malignant neoplasm of lower-outer quadrant of left female breast: Secondary | ICD-10-CM | POA: Diagnosis not present

## 2022-02-06 LAB — RAD ONC ARIA SESSION SUMMARY

## 2022-02-06 MED ORDER — ALRA NON-METALLIC DEODORANT (RAD-ONC)
1.0000 | Freq: Once | TOPICAL | Status: AC
  Administered 2022-02-06: 1 via TOPICAL

## 2022-02-06 MED ORDER — RADIAPLEXRX EX GEL: Freq: Once | CUTANEOUS | Status: AC

## 2022-02-09 ENCOUNTER — Other Ambulatory Visit: Payer: Self-pay

## 2022-02-09 ENCOUNTER — Ambulatory Visit
Admission: RE | Admit: 2022-02-09 | Discharge: 2022-02-09 | Disposition: A | Discharge status: Home / Self Care | Payer: Medicare Other | Source: Ambulatory Visit | Attending: Radiation Oncology | Admitting: Radiation Oncology

## 2022-02-09 DIAGNOSIS — C50412 Malignant neoplasm of upper-outer quadrant of left female breast: Secondary | ICD-10-CM | POA: Diagnosis not present

## 2022-02-09 DIAGNOSIS — Z170 Estrogen receptor positive status [ER+]: Secondary | ICD-10-CM | POA: Diagnosis not present

## 2022-02-09 DIAGNOSIS — C50512 Malignant neoplasm of lower-outer quadrant of left female breast: Secondary | ICD-10-CM | POA: Diagnosis not present

## 2022-02-09 DIAGNOSIS — Z510 Encounter for antineoplastic radiation therapy: Secondary | ICD-10-CM | POA: Diagnosis not present

## 2022-02-09 DIAGNOSIS — E538 Deficiency of other specified B group vitamins: Secondary | ICD-10-CM | POA: Diagnosis not present

## 2022-02-09 LAB — RAD ONC ARIA SESSION SUMMARY
Course Elapsed Days: 4
Plan Fractions Treated to Date: 3
Plan Fractions Treated to Date: 3
Plan Prescribed Dose Per Fraction: 1.8 Gy
Plan Prescribed Dose Per Fraction: 1.8 Gy
Plan Total Fractions Prescribed: 28
Plan Total Fractions Prescribed: 28
Plan Total Prescribed Dose: 50.4 Gy
Plan Total Prescribed Dose: 50.4 Gy
Reference Point Dosage Given to Date: 5.4 Gy
Reference Point Dosage Given to Date: 5.4 Gy
Reference Point Session Dosage Given: 1.8 Gy
Reference Point Session Dosage Given: 1.8 Gy
Session Number: 3

## 2022-02-10 ENCOUNTER — Ambulatory Visit
Admission: RE | Admit: 2022-02-10 | Discharge: 2022-02-10 | Disposition: A | Discharge status: Home / Self Care | Payer: Medicare Other | Source: Ambulatory Visit | Attending: Radiation Oncology | Admitting: Radiation Oncology

## 2022-02-10 ENCOUNTER — Other Ambulatory Visit: Payer: Self-pay

## 2022-02-10 DIAGNOSIS — C50512 Malignant neoplasm of lower-outer quadrant of left female breast: Secondary | ICD-10-CM | POA: Diagnosis not present

## 2022-02-10 DIAGNOSIS — M8580 Other specified disorders of bone density and structure, unspecified site: Secondary | ICD-10-CM | POA: Diagnosis not present

## 2022-02-10 DIAGNOSIS — R7301 Impaired fasting glucose: Secondary | ICD-10-CM | POA: Diagnosis not present

## 2022-02-10 DIAGNOSIS — Z510 Encounter for antineoplastic radiation therapy: Secondary | ICD-10-CM | POA: Diagnosis not present

## 2022-02-10 DIAGNOSIS — I10 Essential (primary) hypertension: Secondary | ICD-10-CM | POA: Diagnosis not present

## 2022-02-10 DIAGNOSIS — C50412 Malignant neoplasm of upper-outer quadrant of left female breast: Secondary | ICD-10-CM | POA: Diagnosis not present

## 2022-02-10 DIAGNOSIS — E785 Hyperlipidemia, unspecified: Secondary | ICD-10-CM | POA: Diagnosis not present

## 2022-02-10 DIAGNOSIS — R7989 Other specified abnormal findings of blood chemistry: Secondary | ICD-10-CM | POA: Diagnosis not present

## 2022-02-10 DIAGNOSIS — E039 Hypothyroidism, unspecified: Secondary | ICD-10-CM | POA: Diagnosis not present

## 2022-02-10 DIAGNOSIS — D649 Anemia, unspecified: Secondary | ICD-10-CM | POA: Diagnosis not present

## 2022-02-10 DIAGNOSIS — Z170 Estrogen receptor positive status [ER+]: Secondary | ICD-10-CM | POA: Diagnosis not present

## 2022-02-10 LAB — RAD ONC ARIA SESSION SUMMARY

## 2022-02-11 ENCOUNTER — Ambulatory Visit
Admission: RE | Admit: 2022-02-11 | Discharge: 2022-02-11 | Disposition: A | Discharge status: Home / Self Care | Payer: Medicare Other | Source: Ambulatory Visit | Attending: Radiation Oncology | Admitting: Radiation Oncology

## 2022-02-11 ENCOUNTER — Other Ambulatory Visit: Payer: Self-pay

## 2022-02-11 DIAGNOSIS — Z170 Estrogen receptor positive status [ER+]: Secondary | ICD-10-CM | POA: Diagnosis not present

## 2022-02-11 DIAGNOSIS — C50512 Malignant neoplasm of lower-outer quadrant of left female breast: Secondary | ICD-10-CM | POA: Insufficient documentation

## 2022-02-11 DIAGNOSIS — C50412 Malignant neoplasm of upper-outer quadrant of left female breast: Secondary | ICD-10-CM | POA: Diagnosis not present

## 2022-02-11 DIAGNOSIS — Z510 Encounter for antineoplastic radiation therapy: Secondary | ICD-10-CM | POA: Diagnosis not present

## 2022-02-11 LAB — RAD ONC ARIA SESSION SUMMARY

## 2022-02-12 ENCOUNTER — Ambulatory Visit
Admission: RE | Admit: 2022-02-12 | Discharge: 2022-02-12 | Disposition: A | Discharge status: Home / Self Care | Payer: Medicare Other | Source: Ambulatory Visit | Attending: Radiation Oncology | Admitting: Radiation Oncology

## 2022-02-12 ENCOUNTER — Other Ambulatory Visit: Payer: Self-pay

## 2022-02-12 DIAGNOSIS — C50512 Malignant neoplasm of lower-outer quadrant of left female breast: Secondary | ICD-10-CM | POA: Diagnosis not present

## 2022-02-12 DIAGNOSIS — C50412 Malignant neoplasm of upper-outer quadrant of left female breast: Secondary | ICD-10-CM | POA: Diagnosis not present

## 2022-02-12 DIAGNOSIS — Z170 Estrogen receptor positive status [ER+]: Secondary | ICD-10-CM | POA: Diagnosis not present

## 2022-02-12 DIAGNOSIS — Z510 Encounter for antineoplastic radiation therapy: Secondary | ICD-10-CM | POA: Diagnosis not present

## 2022-02-12 LAB — RAD ONC ARIA SESSION SUMMARY

## 2022-02-13 ENCOUNTER — Ambulatory Visit
Admission: RE | Admit: 2022-02-13 | Discharge: 2022-02-13 | Disposition: A | Discharge status: Home / Self Care | Payer: Medicare Other | Source: Ambulatory Visit | Attending: Radiation Oncology | Admitting: Radiation Oncology

## 2022-02-13 ENCOUNTER — Other Ambulatory Visit: Payer: Self-pay

## 2022-02-13 DIAGNOSIS — Z510 Encounter for antineoplastic radiation therapy: Secondary | ICD-10-CM | POA: Diagnosis not present

## 2022-02-13 DIAGNOSIS — C50412 Malignant neoplasm of upper-outer quadrant of left female breast: Secondary | ICD-10-CM | POA: Diagnosis not present

## 2022-02-13 DIAGNOSIS — Z170 Estrogen receptor positive status [ER+]: Secondary | ICD-10-CM | POA: Diagnosis not present

## 2022-02-13 DIAGNOSIS — C50512 Malignant neoplasm of lower-outer quadrant of left female breast: Secondary | ICD-10-CM | POA: Diagnosis not present

## 2022-02-13 LAB — RAD ONC ARIA SESSION SUMMARY

## 2022-02-14 NOTE — Progress Notes (Unsigned)
Cardiology Office Note:    Date:  02/16/2022   ID:  NATALIEE SHURTZ, DOB 1940-05-08, MRN [id number]  PCP:  Crist Infante, MD  Cardiologist:  Sinclair Grooms, MD   Referring MD: Crist Infante, MD   Chief Complaint  Patient presents with   Atrial Fibrillation   Congestive Heart Failure    Diastolic   Hypertension   Cardiac Valve Problem    Status post TAVR    History of Present Illness:    CHABELI BARSAMIAN is a 81 y.o. 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   Almyra Free has been through a lot since I last saw her.  She has had TAVR and around the same time breast cancer was diagnosed.  She has had a lumpectomy and is now undergoing radiation therapy.  The TAVR procedure went well.  She still complains of exertional dyspnea.  Echo post TAVR demonstrates grade 2 diastolic dysfunction.  She denies orthopnea and PND.  She is not having angina.  She understands that she has asymptomatic coronary disease.  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 Angelena Form & Dr. Cyndia Bent .    Severe aortic stenosis     Past Surgical History:  Procedure Laterality Date   AXILLARY SENTINEL NODE BIOPSY Left 12/29/2021   Procedure: LEFT AXILLARY SENTINEL NODE BIOPSY;  Surgeon: Rolm Bookbinder, MD;  Location: [facility];  Service: General;  Laterality: Left;   BREAST BIOPSY Bilateral 1970's   x2 one a side   BREAST LUMPECTOMY WITH RADIOACTIVE SEED LOCALIZATION Left 12/29/2021   Procedure: BREAST LUMPECTOMY WITH RADIOACTIVE SEED LOCALIZATION X2;  Surgeon: Rolm Bookbinder, MD;  Location: [facility];  Service: General;  Laterality: Left;   CATARACT EXTRACTION     bilateral   implantable loop recorder removal  05/30/2021   MDT reveal LINQ removed by Dr Rayann Heman   INTRAOPERATIVE TRANSTHORACIC ECHOCARDIOGRAM N/A 08/19/2021   Procedure: INTRAOPERATIVE TRANSTHORACIC ECHOCARDIOGRAM;  Surgeon: Burnell Blanks, MD;  Location: [facility];  Service: Open Heart Surgery;  Laterality: N/A;   LOOP RECORDER INSERTION N/A 08/11/2017   Procedure: LOOP RECORDER INSERTION;  Surgeon: Thompson Grayer, MD;  Location: [facility];  Service: Cardiovascular;  Laterality: N/A;   RADIOACTIVE SEED GUIDED AXILLARY SENTINEL LYMPH NODE Left 12/29/2021   Procedure: LEFT AXILLARY NODE SEED GUIDED EXCISION;  Surgeon: Rolm Bookbinder, MD;  Location: [facility];  Service: General;  Laterality: Left;   RETINAL DETACHMENT SURGERY  2000   RIGHT/LEFT HEART  CATH AND CORONARY ANGIOGRAPHY N/A 06/19/2021   Procedure: RIGHT/LEFT HEART CATH AND CORONARY ANGIOGRAPHY;  Surgeon: Belva Crome, MD;  Location: [facility];  Service: Cardiovascular;  Laterality: N/A;   THYROIDECTOMY  1967   TONSILLECTOMY     TOTAL KNEE ARTHROPLASTY  04/25/2012   Procedure: TOTAL KNEE BILATERAL;  Surgeon: Gearlean Alf, MD;  Location: [facility];  Service: Orthopedics;  Laterality: Bilateral;   TRANSCATHETER AORTIC VALVE REPLACEMENT, TRANSFEMORAL N/A 08/19/2021   Procedure: Transcatheter Aortic Valve Replacement, Transfemoral Using 37m Sapien 3  Ultra Edwards Valve;  Surgeon: MBurnell Blanks MD;  [place] [facility]  Service: Open Heart Surgery;  Laterality: N/A;    Current Medications: Current Meds  Medication Sig   acetaminophen (TYLENOL) 500 MG tablet Take 1,000 mg by mouth every 6 (six) hours as needed for moderate pain.   amiodarone (PACERONE) 200 MG tablet Take 1 tablet (200 mg total) by mouth 3 (three) times a week. Take on Mondays, Wednesdays, and Fridays.   amoxicillin (AMOXIL) 500 MG capsule Take 4 capsules (2,000 mg total) by mouth See admin instructions. Take 4 capsules (2000 mg) by mouth 1 hour prior to dental appointments   apixaban (ELIQUIS) 5 MG TABS tablet Take 1 tablet (5 mg total) by mouth 2 (two) times daily. Resume Apixaban at 8 PM 06/19/2921   b complex vitamins capsule Take 1 capsule by mouth daily.   B-D TB SYRINGE 1CC/27GX1/2" 27G X 1/2" 1 ML MISC Inject into the skin once a week.   Calcium Carb-Cholecalciferol (CALCIUM + D3 PO) Take 1 tablet by mouth in the morning.   cholecalciferol (VITAMIN D3) 25 MCG (1000 UNIT) tablet Take 1,000 Units by mouth in the morning.   dapagliflozin propanediol (FARXIGA) 10 MG TABS tablet Take 1 tablet (10 mg total) by mouth daily before breakfast.   denosumab (PROLIA) 60 MG/ML SOSY injection Inject 60 mg into the skin every 6 (six) months.   dicyclomine (BENTYL) 10 MG capsule Take 10-20 mg by mouth 3 (three) times daily as needed for spasms.   folic acid (FOLVITE) 1 MG tablet Take 2 mg by mouth in the morning.   letrozole (FEMARA) 2.5 MG tablet Take 1 tablet (2.5 mg total) by mouth daily. (Patient taking differently: Take 2.5 mg by mouth every evening.)   levothyroxine (SYNTHROID) 100 MCG tablet Take 100 mcg by mouth daily before breakfast.   loperamide (IMODIUM) 2 MG capsule Take 2-4 mg by mouth 4 (four) times daily as needed (upset stomach/diarrhea/loose stools).   methotrexate 50 MG/2ML injection Inject 25 mg into the skin every Wednesday.   metoprolol succinate (TOPROL  XL) 25 MG 24 hr tablet Take 1 tablet (25 mg total) by mouth daily.   mometasone (ELOCON) 0.1 % ointment Apply topically to vulvar skin no more than twice weekly   nitroGLYCERIN (NITROSTAT) 0.4 MG SL tablet Place 1 tablet (0.4 mg total) under the tongue every 5 (five) minutes as needed for chest pain.   nystatin cream (MYCOSTATIN) Apply 1 application. topically 2 (two) times daily. Apply to affected area BID for up to 7 days to skin under breasts   REPATHA SURECLICK 1161MG/ML SOAJ Inject 140 mg into the skin every 14 (fourteen) days.   telmisartan (MICARDIS) 20 MG tablet Take 20 mg by mouth every evening.   traMADol (ULTRAM) 50 MG tablet Take 1 tablet (50 mg total) by mouth every 6 (six) hours as needed.   zolpidem (AMBIEN) 5 MG tablet Take 2.5 mg by mouth at  bedtime.   [DISCONTINUED] amiodarone (PACERONE) 200 MG tablet Take 1 tablet (200 mg total) by mouth daily. (Patient taking differently: Take 200 mg by mouth every evening.)     Allergies:   Cashew nut oil   Social History   Socioeconomic History   Marital status: Widowed    Spouse name: Not on file   Number of children: 2   Years of education: Not on file   Highest education level: Not on file  Occupational History   Not on file  Tobacco Use   Smoking status: Never    Passive exposure: Never   Smokeless tobacco: Never  Vaping Use   Vaping Use: Never used  Substance and Sexual Activity   Alcohol use: Yes    Alcohol/week: 4.0 - 6.0 standard drinks of alcohol    Types: 4 - 6 Standard drinks or equivalent per week   Drug use: No   Sexual activity: Not Currently    Partners: Male    Birth control/protection: Post-menopausal  Other Topics Concern   Not on file  Social History Narrative   Not on file   Social Determinants of Health   Financial Resource Strain: Low Risk  (08/05/2021)   Overall Financial Resource Strain (CARDIA)    Difficulty of Paying Living Expenses: Not hard at all  Food Insecurity: No Food Insecurity  (08/05/2021)   Hunger Vital Sign    Worried About Running Out of Food in the Last Year: Never true    Ran Out of Food in the Last Year: Never true  Transportation Needs: No Transportation Needs (08/05/2021)   PRAPARE - Hydrologist (Medical): No    Lack of Transportation (Non-Medical): No  Physical Activity: Not on file  Stress: Not on file  Social Connections: Not on file     Family History: The patient's family history includes Aortic aneurysm in her mother; Breast cancer (age of onset: 50) in her maternal aunt; Breast cancer (age of onset: 63) in her maternal grandmother; Clotting disorder in her mother; Healthy in her father; Heart attack in her maternal grandmother; Heart disease in her brother.  ROS:   Please see the history of present illness.    Still living independently.  Wants to know if she has to stay on amiodarone..  All other systems reviewed and are negative.  EKGs/Labs/Other Studies Reviewed:    The following studies were reviewed today: ECHOCARDIOGRAPHY 06/2021: IMPRESSIONS     1. The aortic valve has been repaired/replaced. Aortic valve  regurgitation is not visualized. There is a 23 mm Sapien prosthetic (TAVR)  valve present in the aortic position. Procedure Date: 08/19/2021. Echo  findings are consistent with normal structure  and function of the aortic valve prosthesis. Aortic valve area, by VTI  measures 1.75 cm. Aortic valve mean gradient measures 9.8 mmHg. Aortic  valve Vmax measures 2.08 m/s.   2. Left ventricular ejection fraction, by estimation, is 55 to 60%. The  left ventricle has normal function. The left ventricle has no regional  wall motion abnormalities. Left ventricular diastolic parameters are  consistent with Grade II diastolic  dysfunction (pseudonormalization).   3. Right ventricular systolic function is normal. The right ventricular  size is normal.   4. The mitral valve is grossly normal. Trivial mitral valve   regurgitation. No evidence of mitral stenosis.   5. The inferior vena cava is normal in size with greater than 50%  respiratory variability, suggesting right atrial pressure of 3 mmHg.  Comparison(s): No significant change from prior study. Normal prosthesis.   CHEST AND ABDOMINAL CT 11/07/2021:  CHEST IMPRESSION: 1. 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 Aorta is unchanged in caliber when compared with prior CT of the abdomen and pelvis dated October 29, 2021. Additional deflection flap is seen involving the partially visualized abdominal aorta, recommend CTA of the abdomen and pelvis for complete evaluation of distal extent. 2. Aortic Atherosclerosis (ICD10-I70.0).  ABD IMPRESSION: VASCULAR   1. Small focal dissection of the abdominal aorta which begins at the level of the SMA and terminates above the bilateral renal arteries. 2. See separately dictated same day CT of the chest for discussion of findings above the diaphragm.   NON-VASCULAR   1. Mild soft tissue stranding is seen about the sigmoid colon, decreased when compared with prior CT of the abdomen and pelvis, compatible with resolving acute diverticulitis.    EKG:  EKG not repeated  Recent Labs: 08/15/2021: ALT 14 08/20/2021: Magnesium 2.0 12/25/2021: Hemoglobin 11.3; Platelets 219 12/26/2021: BUN 12; Creatinine, Ser 0.81; Potassium 4.0; Sodium 141  Recent Lipid Panel    Component Value Date/Time   CHOL 122 10/28/2018 0832   TRIG 100 07/11/2021 0945   HDL 55 10/28/2018 0832   CHOLHDL 2.2 10/28/2018 0832   LDLCALC 45 10/28/2018 0832    Physical Exam:    VS:  BP (!) 152/80   Pulse 62   Ht 5' 5.5" (1.664 m)   Wt 171 lb 3.2 oz (77.7 kg)   SpO2 96%   BMI 28.06 kg/m     Wt Readings from Last 3 Encounters:  02/16/22 171 lb 3.2 oz (77.7 kg)  01/15/22 171 lb 3.2 oz (77.7 kg)  01/07/22 170 lb (77.1 kg)     GEN: She looks well.. No acute distress HEENT: Normal NECK: No  JVD. LYMPHATICS: No lymphadenopathy CARDIAC: Soft right upper sternal systolic without diastolic murmur. RRR no gallop, or edema. VASCULAR:  Normal Pulses. No bruits. RESPIRATORY:  Clear to auscultation without rales, wheezing or rhonchi  ABDOMEN: Soft, non-tender, non-distended, No pulsatile mass, MUSCULOSKELETAL: No deformity  SKIN: Warm and dry NEUROLOGIC:  Alert and oriented x 3 PSYCHIATRIC:  Normal affect   ASSESSMENT:    1. Paroxysmal atrial fibrillation (HCC)   2. S/P TAVR (transcatheter aortic valve replacement)   3. Descending thoracic aortic dissection ([facility])   4. Chronic diastolic HF (heart failure) ()   5. LBBB (left bundle branch block)   6. Coronary artery disease involving native coronary artery of native heart without angina pectoris   7. Chronic anticoagulation   8. DOE (dyspnea on exertion)    PLAN:    In order of problems listed above:  Decrease amiodarone to 200 mg Monday, Wednesday, and Friday. She is doing well post TAVR but still has shortness of breath. Noted, patient is asymptomatic, plan chronic medical follow-up. Trial of Farxiga 10 mg/day for 4 to 6 weeks to see if dyspnea improves.  Basic metabolic panel 4 weeks. Did not discuss. Secondary prevention including aggressive lipid-lowering discussed.  Continue evolocumab.  She does admit that she has been slack on taking doses as prescribed.  She now understands that this is important. She is on Eliquis 5 mg twice daily.  This should be continued. Likely related to diastolic heart failure.  We will see how she does on SGLT2 therapy trial.   Overall education and awareness concerning primary/secondary risk prevention was discussed in detail: LDL less than 70, hemoglobin A1c less than  7, blood pressure target less than 130/80 mmHg, >150 minutes of moderate aerobic activity per week, avoidance of smoking, weight control (via diet and exercise), and continued surveillance/management of/for obstructive sleep  apnea.  Guideline directed therapy for left ventricular diastolic dysfunction: SGLT-2 agents -  Dapagliflozin Wilder Glade) or Empagliflozin (J[place]iance).These therapies have been shown to improve clinical outcomes including reduction of rehospitalization, survival, and acute heart failure.    Medication Adjustments/Labs and Tests Ordered: Current medicines are reviewed at length with the patient today.  Concerns regarding medicines are outlined above.  Orders Placed This Encounter  Procedures   Basic metabolic panel   Meds ordered this encounter  Medications   dapagliflozin propanediol (FARXIGA) 10 MG TABS tablet    Sig: Take 1 tablet (10 mg total) by mouth daily before breakfast.    Dispense:  30 tablet    Refill:  11   amiodarone (PACERONE) 200 MG tablet    Sig: Take 1 tablet (200 mg total) by mouth 3 (three) times a week. Take on Mondays, Wednesdays, and Fridays.    Frequency decreased.    Patient Instructions  Medication Instructions:  Your physician has recommended you make the following change in your medication:   1) START dapagliflozin (Farxiga)'10mg'$  daily with breakfast 2) DECREASE Amiodarone '200mg'$  to Mondays, Wednesdays, and Fridays  *If you need a refill on your cardiac medications before your next appointment, please call your pharmacy*  Lab Work: In 1 month: BMET If you have labs (blood work) drawn today and your tests are completely normal, you will receive your results only by: [facility] (if you have MyChart) OR A paper copy in the mail If you have any lab test that is abnormal or we need to change your treatment, we will call you to review the results.  Testing/Procedures: NONE  Follow-Up: At [facility], you and your health needs are our priority.  As part of our continuing mission to provide you with exceptional heart care, we have created designated Provider Care Teams.  These Care Teams include your primary Cardiologist (physician) and  Advanced Practice Providers (APPs -  Physician Assistants and Nurse Practitioners) who all work together to provide you with the care you need, when you need it.  Your next appointment:   1 year(s)  The format for your next appointment:   In Person  Provider:   Lauree Chandler, MD or Gwyndolyn Kaufman, MD  Important Information About Sugar         Signed, Sinclair Grooms, MD  02/16/2022 1:13 PM    [place]

## 2022-02-16 ENCOUNTER — Ambulatory Visit: Payer: Medicare Other | Attending: Interventional Cardiology | Admitting: Interventional Cardiology

## 2022-02-16 ENCOUNTER — Ambulatory Visit: Payer: Medicare Other

## 2022-02-16 ENCOUNTER — Encounter: Payer: Self-pay | Admitting: Interventional Cardiology

## 2022-02-16 VITALS — BP 152/80 | HR 62 | Ht 65.5 in | Wt 171.2 lb

## 2022-02-16 DIAGNOSIS — I2510 Atherosclerotic heart disease of native coronary artery without angina pectoris: Secondary | ICD-10-CM | POA: Diagnosis not present

## 2022-02-16 DIAGNOSIS — Z7901 Long term (current) use of anticoagulants: Secondary | ICD-10-CM | POA: Insufficient documentation

## 2022-02-16 DIAGNOSIS — I71012 Dissection of descending thoracic aorta: Secondary | ICD-10-CM | POA: Insufficient documentation

## 2022-02-16 DIAGNOSIS — R82998 Other abnormal findings in urine: Secondary | ICD-10-CM | POA: Diagnosis not present

## 2022-02-16 DIAGNOSIS — M8580 Other specified disorders of bone density and structure, unspecified site: Secondary | ICD-10-CM | POA: Diagnosis not present

## 2022-02-16 DIAGNOSIS — I447 Left bundle-branch block, unspecified: Secondary | ICD-10-CM | POA: Insufficient documentation

## 2022-02-16 DIAGNOSIS — R7301 Impaired fasting glucose: Secondary | ICD-10-CM | POA: Diagnosis not present

## 2022-02-16 DIAGNOSIS — Z0000 Encounter for general adult medical examination without abnormal findings: Secondary | ICD-10-CM | POA: Diagnosis not present

## 2022-02-16 DIAGNOSIS — Z952 Presence of prosthetic heart valve: Secondary | ICD-10-CM | POA: Insufficient documentation

## 2022-02-16 DIAGNOSIS — C50512 Malignant neoplasm of lower-outer quadrant of left female breast: Secondary | ICD-10-CM | POA: Diagnosis not present

## 2022-02-16 DIAGNOSIS — R0609 Other forms of dyspnea: Secondary | ICD-10-CM | POA: Diagnosis not present

## 2022-02-16 DIAGNOSIS — I5032 Chronic diastolic (congestive) heart failure: Secondary | ICD-10-CM | POA: Diagnosis not present

## 2022-02-16 DIAGNOSIS — I6529 Occlusion and stenosis of unspecified carotid artery: Secondary | ICD-10-CM | POA: Diagnosis not present

## 2022-02-16 DIAGNOSIS — I350 Nonrheumatic aortic (valve) stenosis: Secondary | ICD-10-CM | POA: Diagnosis not present

## 2022-02-16 DIAGNOSIS — K589 Irritable bowel syndrome without diarrhea: Secondary | ICD-10-CM | POA: Diagnosis not present

## 2022-02-16 DIAGNOSIS — Z1331 Encounter for screening for depression: Secondary | ICD-10-CM | POA: Diagnosis not present

## 2022-02-16 DIAGNOSIS — I480 Paroxysmal atrial fibrillation: Secondary | ICD-10-CM | POA: Diagnosis not present

## 2022-02-16 DIAGNOSIS — M353 Polymyalgia rheumatica: Secondary | ICD-10-CM | POA: Diagnosis not present

## 2022-02-16 DIAGNOSIS — D649 Anemia, unspecified: Secondary | ICD-10-CM | POA: Diagnosis not present

## 2022-02-16 DIAGNOSIS — I10 Essential (primary) hypertension: Secondary | ICD-10-CM | POA: Diagnosis not present

## 2022-02-16 DIAGNOSIS — Z23 Encounter for immunization: Secondary | ICD-10-CM | POA: Diagnosis not present

## 2022-02-16 DIAGNOSIS — R809 Proteinuria, unspecified: Secondary | ICD-10-CM | POA: Diagnosis not present

## 2022-02-16 DIAGNOSIS — Z1339 Encounter for screening examination for other mental health and behavioral disorders: Secondary | ICD-10-CM | POA: Diagnosis not present

## 2022-02-16 DIAGNOSIS — E785 Hyperlipidemia, unspecified: Secondary | ICD-10-CM | POA: Diagnosis not present

## 2022-02-16 DIAGNOSIS — Z170 Estrogen receptor positive status [ER+]: Secondary | ICD-10-CM | POA: Diagnosis not present

## 2022-02-16 MED ORDER — AMIODARONE HCL 200 MG PO TABS: 200.0000 mg | ORAL_TABLET | ORAL | Status: DC

## 2022-02-16 MED ORDER — DAPAGLIFLOZIN PROPANEDIOL 10 MG PO TABS: 10.0000 mg | ORAL_TABLET | Freq: Every day | ORAL | 11 refills | Status: DC

## 2022-02-16 NOTE — Patient Instructions (Signed)
Medication Instructions:  Your physician has recommended you make the following change in your medication:   1) START dapagliflozin (Farxiga)'10mg'$  daily with breakfast 2) DECREASE Amiodarone '200mg'$  to Mondays, Wednesdays, and Fridays  *If you need a refill on your cardiac medications before your next appointment, please call your pharmacy*  Lab Work: In 1 month: BMET If you have labs (blood work) drawn today and your tests are completely normal, you will receive your results only by: [facility] (if you have MyChart) OR A paper copy in the mail If you have any lab test that is abnormal or we need to change your treatment, we will call you to review the results.  Testing/Procedures: NONE  Follow-Up: At [facility], you and your health needs are our priority.  As part of our continuing mission to provide you with exceptional heart care, we have created designated Provider Care Teams.  These Care Teams include your primary Cardiologist (physician) and Advanced Practice Providers (APPs -  Physician Assistants and Nurse Practitioners) who all work together to provide you with the care you need, when you need it.  Your next appointment:   1 year(s)  The format for your next appointment:   In Person  Provider:   Lauree Chandler, MD or Gwyndolyn Kaufman, MD  Important Information About Sugar

## 2022-02-17 ENCOUNTER — Other Ambulatory Visit: Payer: Self-pay

## 2022-02-17 ENCOUNTER — Ambulatory Visit
Admission: RE | Admit: 2022-02-17 | Discharge: 2022-02-17 | Disposition: A | Discharge status: Home / Self Care | Payer: Medicare Other | Source: Ambulatory Visit | Attending: Radiation Oncology | Admitting: Radiation Oncology

## 2022-02-17 DIAGNOSIS — Z510 Encounter for antineoplastic radiation therapy: Secondary | ICD-10-CM | POA: Diagnosis not present

## 2022-02-17 DIAGNOSIS — Z170 Estrogen receptor positive status [ER+]: Secondary | ICD-10-CM | POA: Diagnosis not present

## 2022-02-17 DIAGNOSIS — C50512 Malignant neoplasm of lower-outer quadrant of left female breast: Secondary | ICD-10-CM | POA: Diagnosis not present

## 2022-02-17 DIAGNOSIS — C50412 Malignant neoplasm of upper-outer quadrant of left female breast: Secondary | ICD-10-CM | POA: Diagnosis not present

## 2022-02-17 LAB — RAD ONC ARIA SESSION SUMMARY
Course Elapsed Days: 12
Plan Fractions Treated to Date: 8
Plan Fractions Treated to Date: 8
Plan Prescribed Dose Per Fraction: 1.8 Gy
Plan Prescribed Dose Per Fraction: 1.8 Gy
Plan Total Fractions Prescribed: 28
Plan Total Fractions Prescribed: 28
Plan Total Prescribed Dose: 50.4 Gy
Plan Total Prescribed Dose: 50.4 Gy
Reference Point Dosage Given to Date: 14.4 Gy
Reference Point Dosage Given to Date: 14.4 Gy
Reference Point Session Dosage Given: 1.8 Gy
Reference Point Session Dosage Given: 1.8 Gy
Session Number: 8

## 2022-02-18 ENCOUNTER — Ambulatory Visit
Admission: RE | Admit: 2022-02-18 | Discharge: 2022-02-18 | Disposition: A | Discharge status: Home / Self Care | Payer: Medicare Other | Source: Ambulatory Visit | Attending: Radiation Oncology | Admitting: Radiation Oncology

## 2022-02-18 ENCOUNTER — Other Ambulatory Visit: Payer: Self-pay

## 2022-02-18 DIAGNOSIS — Z170 Estrogen receptor positive status [ER+]: Secondary | ICD-10-CM | POA: Diagnosis not present

## 2022-02-18 DIAGNOSIS — C50412 Malignant neoplasm of upper-outer quadrant of left female breast: Secondary | ICD-10-CM | POA: Diagnosis not present

## 2022-02-18 DIAGNOSIS — C50512 Malignant neoplasm of lower-outer quadrant of left female breast: Secondary | ICD-10-CM | POA: Diagnosis not present

## 2022-02-18 DIAGNOSIS — Z510 Encounter for antineoplastic radiation therapy: Secondary | ICD-10-CM | POA: Diagnosis not present

## 2022-02-18 LAB — RAD ONC ARIA SESSION SUMMARY
Course Elapsed Days: 13
Plan Fractions Treated to Date: 9
Plan Fractions Treated to Date: 9
Plan Prescribed Dose Per Fraction: 1.8 Gy
Plan Prescribed Dose Per Fraction: 1.8 Gy
Plan Total Fractions Prescribed: 28
Plan Total Fractions Prescribed: 28
Plan Total Prescribed Dose: 50.4 Gy
Plan Total Prescribed Dose: 50.4 Gy
Reference Point Dosage Given to Date: 16.2 Gy
Reference Point Dosage Given to Date: 16.2 Gy
Reference Point Session Dosage Given: 1.8 Gy
Reference Point Session Dosage Given: 1.8 Gy
Session Number: 9

## 2022-02-19 ENCOUNTER — Other Ambulatory Visit: Payer: Self-pay

## 2022-02-19 ENCOUNTER — Ambulatory Visit
Admission: RE | Admit: 2022-02-19 | Discharge: 2022-02-19 | Disposition: A | Discharge status: Home / Self Care | Payer: Medicare Other | Source: Ambulatory Visit | Attending: Radiation Oncology | Admitting: Radiation Oncology

## 2022-02-19 DIAGNOSIS — Z170 Estrogen receptor positive status [ER+]: Secondary | ICD-10-CM | POA: Diagnosis not present

## 2022-02-19 DIAGNOSIS — C50512 Malignant neoplasm of lower-outer quadrant of left female breast: Secondary | ICD-10-CM | POA: Diagnosis not present

## 2022-02-19 DIAGNOSIS — Z510 Encounter for antineoplastic radiation therapy: Secondary | ICD-10-CM | POA: Diagnosis not present

## 2022-02-19 DIAGNOSIS — C50412 Malignant neoplasm of upper-outer quadrant of left female breast: Secondary | ICD-10-CM | POA: Diagnosis not present

## 2022-02-19 LAB — RAD ONC ARIA SESSION SUMMARY
Course Elapsed Days: 14
Plan Fractions Treated to Date: 10
Plan Fractions Treated to Date: 10
Plan Prescribed Dose Per Fraction: 1.8 Gy
Plan Prescribed Dose Per Fraction: 1.8 Gy
Plan Total Fractions Prescribed: 28
Plan Total Fractions Prescribed: 28
Plan Total Prescribed Dose: 50.4 Gy
Plan Total Prescribed Dose: 50.4 Gy
Reference Point Dosage Given to Date: 18 Gy
Reference Point Dosage Given to Date: 18 Gy
Reference Point Session Dosage Given: 1.8 Gy
Reference Point Session Dosage Given: 1.8 Gy
Session Number: 10

## 2022-02-20 ENCOUNTER — Ambulatory Visit
Admission: RE | Admit: 2022-02-20 | Discharge: 2022-02-20 | Disposition: A | Discharge status: Home / Self Care | Payer: Medicare Other | Source: Ambulatory Visit | Attending: Radiation Oncology | Admitting: Radiation Oncology

## 2022-02-20 ENCOUNTER — Other Ambulatory Visit: Payer: Self-pay

## 2022-02-20 DIAGNOSIS — C50512 Malignant neoplasm of lower-outer quadrant of left female breast: Secondary | ICD-10-CM | POA: Diagnosis not present

## 2022-02-20 DIAGNOSIS — Z510 Encounter for antineoplastic radiation therapy: Secondary | ICD-10-CM | POA: Diagnosis not present

## 2022-02-20 DIAGNOSIS — Z170 Estrogen receptor positive status [ER+]: Secondary | ICD-10-CM | POA: Diagnosis not present

## 2022-02-20 DIAGNOSIS — C50412 Malignant neoplasm of upper-outer quadrant of left female breast: Secondary | ICD-10-CM | POA: Diagnosis not present

## 2022-02-20 LAB — RAD ONC ARIA SESSION SUMMARY

## 2022-02-20 MED ORDER — RADIAPLEXRX EX GEL: Freq: Once | CUTANEOUS | Status: AC

## 2022-02-23 ENCOUNTER — Other Ambulatory Visit: Payer: Self-pay

## 2022-02-23 ENCOUNTER — Ambulatory Visit
Admission: RE | Admit: 2022-02-23 | Discharge: 2022-02-23 | Disposition: A | Discharge status: Home / Self Care | Payer: Medicare Other | Source: Ambulatory Visit | Attending: Radiation Oncology | Admitting: Radiation Oncology

## 2022-02-23 DIAGNOSIS — Z510 Encounter for antineoplastic radiation therapy: Secondary | ICD-10-CM | POA: Diagnosis not present

## 2022-02-23 DIAGNOSIS — C50512 Malignant neoplasm of lower-outer quadrant of left female breast: Secondary | ICD-10-CM | POA: Diagnosis not present

## 2022-02-23 DIAGNOSIS — C50412 Malignant neoplasm of upper-outer quadrant of left female breast: Secondary | ICD-10-CM | POA: Diagnosis not present

## 2022-02-23 DIAGNOSIS — Z170 Estrogen receptor positive status [ER+]: Secondary | ICD-10-CM | POA: Diagnosis not present

## 2022-02-23 LAB — RAD ONC ARIA SESSION SUMMARY
Course Elapsed Days: 18
Plan Fractions Treated to Date: 12
Plan Fractions Treated to Date: 12
Plan Prescribed Dose Per Fraction: 1.8 Gy
Plan Prescribed Dose Per Fraction: 1.8 Gy
Plan Total Fractions Prescribed: 28
Plan Total Fractions Prescribed: 28
Plan Total Prescribed Dose: 50.4 Gy
Plan Total Prescribed Dose: 50.4 Gy
Reference Point Dosage Given to Date: 21.6 Gy
Reference Point Dosage Given to Date: 21.6 Gy
Reference Point Session Dosage Given: 1.8 Gy
Reference Point Session Dosage Given: 1.8 Gy
Session Number: 12

## 2022-02-24 ENCOUNTER — Other Ambulatory Visit: Payer: Self-pay

## 2022-02-24 ENCOUNTER — Ambulatory Visit
Admission: RE | Admit: 2022-02-24 | Discharge: 2022-02-24 | Disposition: A | Discharge status: Home / Self Care | Payer: Medicare Other | Source: Ambulatory Visit | Attending: Radiation Oncology | Admitting: Radiation Oncology

## 2022-02-24 DIAGNOSIS — Z170 Estrogen receptor positive status [ER+]: Secondary | ICD-10-CM | POA: Diagnosis not present

## 2022-02-24 DIAGNOSIS — Z510 Encounter for antineoplastic radiation therapy: Secondary | ICD-10-CM | POA: Diagnosis not present

## 2022-02-24 DIAGNOSIS — C50512 Malignant neoplasm of lower-outer quadrant of left female breast: Secondary | ICD-10-CM | POA: Diagnosis not present

## 2022-02-24 DIAGNOSIS — C50412 Malignant neoplasm of upper-outer quadrant of left female breast: Secondary | ICD-10-CM | POA: Diagnosis not present

## 2022-02-24 LAB — RAD ONC ARIA SESSION SUMMARY
Course Elapsed Days: 19
Plan Fractions Treated to Date: 13
Plan Fractions Treated to Date: 13
Plan Prescribed Dose Per Fraction: 1.8 Gy
Plan Prescribed Dose Per Fraction: 1.8 Gy
Plan Total Fractions Prescribed: 28
Plan Total Fractions Prescribed: 28
Plan Total Prescribed Dose: 50.4 Gy
Plan Total Prescribed Dose: 50.4 Gy
Reference Point Dosage Given to Date: 23.4 Gy
Reference Point Dosage Given to Date: 23.4 Gy
Reference Point Session Dosage Given: 1.8 Gy
Reference Point Session Dosage Given: 1.8 Gy
Session Number: 13

## 2022-02-25 ENCOUNTER — Other Ambulatory Visit: Payer: Self-pay

## 2022-02-25 ENCOUNTER — Ambulatory Visit
Admission: RE | Admit: 2022-02-25 | Discharge: 2022-02-25 | Disposition: A | Discharge status: Home / Self Care | Payer: Medicare Other | Source: Ambulatory Visit | Attending: Radiation Oncology | Admitting: Radiation Oncology

## 2022-02-25 DIAGNOSIS — Z510 Encounter for antineoplastic radiation therapy: Secondary | ICD-10-CM | POA: Diagnosis not present

## 2022-02-25 DIAGNOSIS — Z170 Estrogen receptor positive status [ER+]: Secondary | ICD-10-CM | POA: Diagnosis not present

## 2022-02-25 DIAGNOSIS — C50512 Malignant neoplasm of lower-outer quadrant of left female breast: Secondary | ICD-10-CM | POA: Diagnosis not present

## 2022-02-25 DIAGNOSIS — C50412 Malignant neoplasm of upper-outer quadrant of left female breast: Secondary | ICD-10-CM | POA: Diagnosis not present

## 2022-02-25 LAB — RAD ONC ARIA SESSION SUMMARY
Course Elapsed Days: 20
Plan Fractions Treated to Date: 14
Plan Fractions Treated to Date: 14
Plan Prescribed Dose Per Fraction: 1.8 Gy
Plan Prescribed Dose Per Fraction: 1.8 Gy
Plan Total Fractions Prescribed: 28
Plan Total Fractions Prescribed: 28
Plan Total Prescribed Dose: 50.4 Gy
Plan Total Prescribed Dose: 50.4 Gy
Reference Point Dosage Given to Date: 25.2 Gy
Reference Point Dosage Given to Date: 25.2 Gy
Reference Point Session Dosage Given: 1.8 Gy
Reference Point Session Dosage Given: 1.8 Gy
Session Number: 14

## 2022-02-26 ENCOUNTER — Other Ambulatory Visit: Payer: Self-pay

## 2022-02-26 ENCOUNTER — Ambulatory Visit
Admission: RE | Admit: 2022-02-26 | Discharge: 2022-02-26 | Disposition: A | Discharge status: Home / Self Care | Payer: Medicare Other | Source: Ambulatory Visit | Attending: Radiation Oncology | Admitting: Radiation Oncology

## 2022-02-26 DIAGNOSIS — Z510 Encounter for antineoplastic radiation therapy: Secondary | ICD-10-CM | POA: Diagnosis not present

## 2022-02-26 DIAGNOSIS — C50512 Malignant neoplasm of lower-outer quadrant of left female breast: Secondary | ICD-10-CM | POA: Diagnosis not present

## 2022-02-26 DIAGNOSIS — C50412 Malignant neoplasm of upper-outer quadrant of left female breast: Secondary | ICD-10-CM | POA: Diagnosis not present

## 2022-02-26 DIAGNOSIS — Z170 Estrogen receptor positive status [ER+]: Secondary | ICD-10-CM | POA: Diagnosis not present

## 2022-02-26 LAB — RAD ONC ARIA SESSION SUMMARY
Course Elapsed Days: 21
Plan Fractions Treated to Date: 15
Plan Fractions Treated to Date: 15
Plan Prescribed Dose Per Fraction: 1.8 Gy
Plan Prescribed Dose Per Fraction: 1.8 Gy
Plan Total Fractions Prescribed: 28
Plan Total Fractions Prescribed: 28
Plan Total Prescribed Dose: 50.4 Gy
Plan Total Prescribed Dose: 50.4 Gy
Reference Point Dosage Given to Date: 27 Gy
Reference Point Dosage Given to Date: 27 Gy
Reference Point Session Dosage Given: 1.8 Gy
Reference Point Session Dosage Given: 1.8 Gy
Session Number: 15

## 2022-02-27 ENCOUNTER — Ambulatory Visit
Admission: RE | Admit: 2022-02-27 | Discharge: 2022-02-27 | Disposition: A | Discharge status: Home / Self Care | Payer: Medicare Other | Source: Ambulatory Visit | Attending: Radiation Oncology | Admitting: Radiation Oncology

## 2022-02-27 ENCOUNTER — Other Ambulatory Visit: Payer: Self-pay

## 2022-02-27 DIAGNOSIS — C50512 Malignant neoplasm of lower-outer quadrant of left female breast: Secondary | ICD-10-CM | POA: Diagnosis not present

## 2022-02-27 DIAGNOSIS — C50412 Malignant neoplasm of upper-outer quadrant of left female breast: Secondary | ICD-10-CM | POA: Diagnosis not present

## 2022-02-27 DIAGNOSIS — Z170 Estrogen receptor positive status [ER+]: Secondary | ICD-10-CM | POA: Diagnosis not present

## 2022-02-27 DIAGNOSIS — Z510 Encounter for antineoplastic radiation therapy: Secondary | ICD-10-CM | POA: Diagnosis not present

## 2022-02-27 LAB — RAD ONC ARIA SESSION SUMMARY
Course Elapsed Days: 22
Plan Fractions Treated to Date: 16
Plan Fractions Treated to Date: 16
Plan Prescribed Dose Per Fraction: 1.8 Gy
Plan Prescribed Dose Per Fraction: 1.8 Gy
Plan Total Fractions Prescribed: 28
Plan Total Fractions Prescribed: 28
Plan Total Prescribed Dose: 50.4 Gy
Plan Total Prescribed Dose: 50.4 Gy
Reference Point Dosage Given to Date: 28.8 Gy
Reference Point Dosage Given to Date: 28.8 Gy
Reference Point Session Dosage Given: 1.8 Gy
Reference Point Session Dosage Given: 1.8 Gy
Session Number: 16

## 2022-03-01 ENCOUNTER — Ambulatory Visit
Admission: RE | Admit: 2022-03-01 | Discharge: 2022-03-01 | Disposition: A | Discharge status: Home / Self Care | Payer: Medicare Other | Source: Ambulatory Visit | Attending: Radiation Oncology | Admitting: Radiation Oncology

## 2022-03-01 ENCOUNTER — Other Ambulatory Visit: Payer: Self-pay

## 2022-03-01 DIAGNOSIS — C50412 Malignant neoplasm of upper-outer quadrant of left female breast: Secondary | ICD-10-CM | POA: Diagnosis not present

## 2022-03-01 DIAGNOSIS — Z170 Estrogen receptor positive status [ER+]: Secondary | ICD-10-CM | POA: Diagnosis not present

## 2022-03-01 DIAGNOSIS — Z510 Encounter for antineoplastic radiation therapy: Secondary | ICD-10-CM | POA: Diagnosis not present

## 2022-03-01 DIAGNOSIS — C50512 Malignant neoplasm of lower-outer quadrant of left female breast: Secondary | ICD-10-CM | POA: Diagnosis not present

## 2022-03-01 LAB — RAD ONC ARIA SESSION SUMMARY
Course Elapsed Days: 24
Plan Fractions Treated to Date: 17
Plan Fractions Treated to Date: 17
Plan Prescribed Dose Per Fraction: 1.8 Gy
Plan Prescribed Dose Per Fraction: 1.8 Gy
Plan Total Fractions Prescribed: 28
Plan Total Fractions Prescribed: 28
Plan Total Prescribed Dose: 50.4 Gy
Plan Total Prescribed Dose: 50.4 Gy
Reference Point Dosage Given to Date: 30.6 Gy
Reference Point Dosage Given to Date: 30.6 Gy
Reference Point Session Dosage Given: 1.8 Gy
Reference Point Session Dosage Given: 1.8 Gy
Session Number: 17

## 2022-03-02 ENCOUNTER — Ambulatory Visit
Admission: RE | Admit: 2022-03-02 | Discharge: 2022-03-02 | Disposition: A | Discharge status: Home / Self Care | Payer: Medicare Other | Source: Ambulatory Visit | Attending: Radiation Oncology | Admitting: Radiation Oncology

## 2022-03-02 ENCOUNTER — Other Ambulatory Visit: Payer: Self-pay

## 2022-03-02 DIAGNOSIS — Z170 Estrogen receptor positive status [ER+]: Secondary | ICD-10-CM | POA: Diagnosis not present

## 2022-03-02 DIAGNOSIS — Z510 Encounter for antineoplastic radiation therapy: Secondary | ICD-10-CM | POA: Diagnosis not present

## 2022-03-02 DIAGNOSIS — C50512 Malignant neoplasm of lower-outer quadrant of left female breast: Secondary | ICD-10-CM | POA: Diagnosis not present

## 2022-03-02 DIAGNOSIS — C50412 Malignant neoplasm of upper-outer quadrant of left female breast: Secondary | ICD-10-CM | POA: Diagnosis not present

## 2022-03-02 LAB — RAD ONC ARIA SESSION SUMMARY
Course Elapsed Days: 25
Plan Fractions Treated to Date: 18
Plan Fractions Treated to Date: 18
Plan Prescribed Dose Per Fraction: 1.8 Gy
Plan Prescribed Dose Per Fraction: 1.8 Gy
Plan Total Fractions Prescribed: 28
Plan Total Fractions Prescribed: 28
Plan Total Prescribed Dose: 50.4 Gy
Plan Total Prescribed Dose: 50.4 Gy
Reference Point Dosage Given to Date: 32.4 Gy
Reference Point Dosage Given to Date: 32.4 Gy
Reference Point Session Dosage Given: 1.8 Gy
Reference Point Session Dosage Given: 1.8 Gy
Session Number: 18

## 2022-03-03 ENCOUNTER — Ambulatory Visit
Admission: RE | Admit: 2022-03-03 | Discharge: 2022-03-03 | Disposition: A | Discharge status: Home / Self Care | Payer: Medicare Other | Source: Ambulatory Visit | Attending: Radiation Oncology | Admitting: Radiation Oncology

## 2022-03-03 ENCOUNTER — Other Ambulatory Visit: Payer: Self-pay

## 2022-03-03 DIAGNOSIS — Z510 Encounter for antineoplastic radiation therapy: Secondary | ICD-10-CM | POA: Diagnosis not present

## 2022-03-03 DIAGNOSIS — Z170 Estrogen receptor positive status [ER+]: Secondary | ICD-10-CM | POA: Diagnosis not present

## 2022-03-03 DIAGNOSIS — C50512 Malignant neoplasm of lower-outer quadrant of left female breast: Secondary | ICD-10-CM | POA: Diagnosis not present

## 2022-03-03 DIAGNOSIS — C50412 Malignant neoplasm of upper-outer quadrant of left female breast: Secondary | ICD-10-CM | POA: Diagnosis not present

## 2022-03-03 LAB — RAD ONC ARIA SESSION SUMMARY
Course Elapsed Days: 26
Plan Fractions Treated to Date: 19
Plan Fractions Treated to Date: 19
Plan Prescribed Dose Per Fraction: 1.8 Gy
Plan Prescribed Dose Per Fraction: 1.8 Gy
Plan Total Fractions Prescribed: 28
Plan Total Fractions Prescribed: 28
Plan Total Prescribed Dose: 50.4 Gy
Plan Total Prescribed Dose: 50.4 Gy
Reference Point Dosage Given to Date: 34.2 Gy
Reference Point Dosage Given to Date: 34.2 Gy
Reference Point Session Dosage Given: 1.8 Gy
Reference Point Session Dosage Given: 1.8 Gy
Session Number: 19

## 2022-03-04 ENCOUNTER — Other Ambulatory Visit: Payer: Self-pay

## 2022-03-04 ENCOUNTER — Ambulatory Visit
Admission: RE | Admit: 2022-03-04 | Discharge: 2022-03-04 | Disposition: A | Discharge status: Home / Self Care | Payer: Medicare Other | Source: Ambulatory Visit | Attending: Radiation Oncology | Admitting: Radiation Oncology

## 2022-03-04 DIAGNOSIS — Z170 Estrogen receptor positive status [ER+]: Secondary | ICD-10-CM | POA: Diagnosis not present

## 2022-03-04 DIAGNOSIS — C50412 Malignant neoplasm of upper-outer quadrant of left female breast: Secondary | ICD-10-CM | POA: Diagnosis not present

## 2022-03-04 DIAGNOSIS — C50512 Malignant neoplasm of lower-outer quadrant of left female breast: Secondary | ICD-10-CM | POA: Diagnosis not present

## 2022-03-04 DIAGNOSIS — Z510 Encounter for antineoplastic radiation therapy: Secondary | ICD-10-CM | POA: Diagnosis not present

## 2022-03-04 LAB — RAD ONC ARIA SESSION SUMMARY
Course Elapsed Days: 27
Plan Fractions Treated to Date: 20
Plan Fractions Treated to Date: 20
Plan Prescribed Dose Per Fraction: 1.8 Gy
Plan Prescribed Dose Per Fraction: 1.8 Gy
Plan Total Fractions Prescribed: 28
Plan Total Fractions Prescribed: 28
Plan Total Prescribed Dose: 50.4 Gy
Plan Total Prescribed Dose: 50.4 Gy
Reference Point Dosage Given to Date: 36 Gy
Reference Point Dosage Given to Date: 36 Gy
Reference Point Session Dosage Given: 1.8 Gy
Reference Point Session Dosage Given: 1.8 Gy
Session Number: 20

## 2022-03-09 ENCOUNTER — Ambulatory Visit
Admission: RE | Admit: 2022-03-09 | Discharge: 2022-03-09 | Disposition: A | Discharge status: Home / Self Care | Payer: Medicare Other | Source: Ambulatory Visit | Attending: Radiation Oncology | Admitting: Radiation Oncology

## 2022-03-09 ENCOUNTER — Other Ambulatory Visit: Payer: Self-pay

## 2022-03-09 DIAGNOSIS — Z170 Estrogen receptor positive status [ER+]: Secondary | ICD-10-CM | POA: Diagnosis not present

## 2022-03-09 DIAGNOSIS — C50412 Malignant neoplasm of upper-outer quadrant of left female breast: Secondary | ICD-10-CM | POA: Diagnosis not present

## 2022-03-09 DIAGNOSIS — Z510 Encounter for antineoplastic radiation therapy: Secondary | ICD-10-CM | POA: Diagnosis not present

## 2022-03-09 DIAGNOSIS — C50512 Malignant neoplasm of lower-outer quadrant of left female breast: Secondary | ICD-10-CM | POA: Diagnosis not present

## 2022-03-09 LAB — RAD ONC ARIA SESSION SUMMARY
Course Elapsed Days: 32
Plan Fractions Treated to Date: 21
Plan Fractions Treated to Date: 21
Plan Prescribed Dose Per Fraction: 1.8 Gy
Plan Prescribed Dose Per Fraction: 1.8 Gy
Plan Total Fractions Prescribed: 28
Plan Total Fractions Prescribed: 28
Plan Total Prescribed Dose: 50.4 Gy
Plan Total Prescribed Dose: 50.4 Gy
Reference Point Dosage Given to Date: 37.8 Gy
Reference Point Dosage Given to Date: 37.8 Gy
Reference Point Session Dosage Given: 1.8 Gy
Reference Point Session Dosage Given: 1.8 Gy
Session Number: 21

## 2022-03-10 ENCOUNTER — Ambulatory Visit
Admission: RE | Admit: 2022-03-10 | Discharge: 2022-03-10 | Disposition: A | Discharge status: Home / Self Care | Payer: Medicare Other | Source: Ambulatory Visit | Attending: Radiation Oncology | Admitting: Radiation Oncology

## 2022-03-10 ENCOUNTER — Other Ambulatory Visit: Payer: Self-pay

## 2022-03-10 DIAGNOSIS — Z170 Estrogen receptor positive status [ER+]: Secondary | ICD-10-CM | POA: Diagnosis not present

## 2022-03-10 DIAGNOSIS — C50412 Malignant neoplasm of upper-outer quadrant of left female breast: Secondary | ICD-10-CM | POA: Diagnosis not present

## 2022-03-10 DIAGNOSIS — Z510 Encounter for antineoplastic radiation therapy: Secondary | ICD-10-CM | POA: Diagnosis not present

## 2022-03-10 DIAGNOSIS — C50512 Malignant neoplasm of lower-outer quadrant of left female breast: Secondary | ICD-10-CM | POA: Diagnosis not present

## 2022-03-10 LAB — RAD ONC ARIA SESSION SUMMARY
Course Elapsed Days: 33
Plan Fractions Treated to Date: 22
Plan Fractions Treated to Date: 22
Plan Prescribed Dose Per Fraction: 1.8 Gy
Plan Prescribed Dose Per Fraction: 1.8 Gy
Plan Total Fractions Prescribed: 28
Plan Total Fractions Prescribed: 28
Plan Total Prescribed Dose: 50.4 Gy
Plan Total Prescribed Dose: 50.4 Gy
Reference Point Dosage Given to Date: 39.6 Gy
Reference Point Dosage Given to Date: 39.6 Gy
Reference Point Session Dosage Given: 1.8 Gy
Reference Point Session Dosage Given: 1.8 Gy
Session Number: 22

## 2022-03-11 ENCOUNTER — Other Ambulatory Visit: Payer: Self-pay

## 2022-03-11 ENCOUNTER — Ambulatory Visit
Admission: RE | Admit: 2022-03-11 | Discharge: 2022-03-11 | Disposition: A | Discharge status: Home / Self Care | Payer: Medicare Other | Source: Ambulatory Visit | Attending: Radiation Oncology | Admitting: Radiation Oncology

## 2022-03-11 DIAGNOSIS — C50412 Malignant neoplasm of upper-outer quadrant of left female breast: Secondary | ICD-10-CM | POA: Diagnosis not present

## 2022-03-11 DIAGNOSIS — C50512 Malignant neoplasm of lower-outer quadrant of left female breast: Secondary | ICD-10-CM | POA: Diagnosis not present

## 2022-03-11 DIAGNOSIS — Z510 Encounter for antineoplastic radiation therapy: Secondary | ICD-10-CM | POA: Diagnosis not present

## 2022-03-11 DIAGNOSIS — Z170 Estrogen receptor positive status [ER+]: Secondary | ICD-10-CM | POA: Diagnosis not present

## 2022-03-11 LAB — RAD ONC ARIA SESSION SUMMARY
Course Elapsed Days: 34
Plan Fractions Treated to Date: 23
Plan Fractions Treated to Date: 23
Plan Prescribed Dose Per Fraction: 1.8 Gy
Plan Prescribed Dose Per Fraction: 1.8 Gy
Plan Total Fractions Prescribed: 28
Plan Total Fractions Prescribed: 28
Plan Total Prescribed Dose: 50.4 Gy
Plan Total Prescribed Dose: 50.4 Gy
Reference Point Dosage Given to Date: 41.4 Gy
Reference Point Dosage Given to Date: 41.4 Gy
Reference Point Session Dosage Given: 1.8 Gy
Reference Point Session Dosage Given: 1.8 Gy
Session Number: 23

## 2022-03-12 ENCOUNTER — Ambulatory Visit
Admission: RE | Admit: 2022-03-12 | Discharge: 2022-03-12 | Disposition: A | Discharge status: Home / Self Care | Payer: Medicare Other | Source: Ambulatory Visit | Attending: Radiation Oncology | Admitting: Radiation Oncology

## 2022-03-12 ENCOUNTER — Other Ambulatory Visit: Payer: Self-pay

## 2022-03-12 DIAGNOSIS — C50412 Malignant neoplasm of upper-outer quadrant of left female breast: Secondary | ICD-10-CM | POA: Diagnosis not present

## 2022-03-12 DIAGNOSIS — C50512 Malignant neoplasm of lower-outer quadrant of left female breast: Secondary | ICD-10-CM | POA: Diagnosis not present

## 2022-03-12 DIAGNOSIS — Z510 Encounter for antineoplastic radiation therapy: Secondary | ICD-10-CM | POA: Diagnosis not present

## 2022-03-12 DIAGNOSIS — Z170 Estrogen receptor positive status [ER+]: Secondary | ICD-10-CM | POA: Diagnosis not present

## 2022-03-12 LAB — RAD ONC ARIA SESSION SUMMARY
Course Elapsed Days: 35
Plan Fractions Treated to Date: 24
Plan Fractions Treated to Date: 24
Plan Prescribed Dose Per Fraction: 1.8 Gy
Plan Prescribed Dose Per Fraction: 1.8 Gy
Plan Total Fractions Prescribed: 28
Plan Total Fractions Prescribed: 28
Plan Total Prescribed Dose: 50.4 Gy
Plan Total Prescribed Dose: 50.4 Gy
Reference Point Dosage Given to Date: 43.2 Gy
Reference Point Dosage Given to Date: 43.2 Gy
Reference Point Session Dosage Given: 1.8 Gy
Reference Point Session Dosage Given: 1.8 Gy
Session Number: 24

## 2022-03-13 ENCOUNTER — Ambulatory Visit
Admission: RE | Admit: 2022-03-13 | Discharge: 2022-03-13 | Disposition: A | Discharge status: Home / Self Care | Payer: Medicare Other | Source: Ambulatory Visit | Attending: Radiation Oncology | Admitting: Radiation Oncology

## 2022-03-13 ENCOUNTER — Other Ambulatory Visit: Payer: Self-pay

## 2022-03-13 ENCOUNTER — Ambulatory Visit: Payer: Medicare Other | Admitting: Radiation Oncology

## 2022-03-13 DIAGNOSIS — C50512 Malignant neoplasm of lower-outer quadrant of left female breast: Secondary | ICD-10-CM | POA: Diagnosis not present

## 2022-03-13 DIAGNOSIS — Z170 Estrogen receptor positive status [ER+]: Secondary | ICD-10-CM | POA: Diagnosis not present

## 2022-03-13 DIAGNOSIS — Z510 Encounter for antineoplastic radiation therapy: Secondary | ICD-10-CM | POA: Insufficient documentation

## 2022-03-13 DIAGNOSIS — C50412 Malignant neoplasm of upper-outer quadrant of left female breast: Secondary | ICD-10-CM | POA: Diagnosis not present

## 2022-03-13 LAB — RAD ONC ARIA SESSION SUMMARY
Course Elapsed Days: 36
Plan Fractions Treated to Date: 25
Plan Fractions Treated to Date: 25
Plan Prescribed Dose Per Fraction: 1.8 Gy
Plan Prescribed Dose Per Fraction: 1.8 Gy
Plan Total Fractions Prescribed: 28
Plan Total Fractions Prescribed: 28
Plan Total Prescribed Dose: 50.4 Gy
Plan Total Prescribed Dose: 50.4 Gy
Reference Point Dosage Given to Date: 45 Gy
Reference Point Dosage Given to Date: 45 Gy
Reference Point Session Dosage Given: 1.8 Gy
Reference Point Session Dosage Given: 1.8 Gy
Session Number: 25

## 2022-03-16 ENCOUNTER — Ambulatory Visit
Admission: RE | Admit: 2022-03-16 | Discharge: 2022-03-16 | Disposition: A | Discharge status: Home / Self Care | Payer: Medicare Other | Source: Ambulatory Visit | Attending: Radiation Oncology | Admitting: Radiation Oncology

## 2022-03-16 ENCOUNTER — Other Ambulatory Visit: Payer: Self-pay

## 2022-03-16 DIAGNOSIS — C50512 Malignant neoplasm of lower-outer quadrant of left female breast: Secondary | ICD-10-CM | POA: Diagnosis not present

## 2022-03-16 DIAGNOSIS — Z510 Encounter for antineoplastic radiation therapy: Secondary | ICD-10-CM | POA: Diagnosis not present

## 2022-03-16 DIAGNOSIS — Z170 Estrogen receptor positive status [ER+]: Secondary | ICD-10-CM | POA: Diagnosis not present

## 2022-03-16 DIAGNOSIS — C50412 Malignant neoplasm of upper-outer quadrant of left female breast: Secondary | ICD-10-CM | POA: Diagnosis not present

## 2022-03-16 LAB — RAD ONC ARIA SESSION SUMMARY
Course Elapsed Days: 39
Plan Fractions Treated to Date: 26
Plan Fractions Treated to Date: 26
Plan Prescribed Dose Per Fraction: 1.8 Gy
Plan Prescribed Dose Per Fraction: 1.8 Gy
Plan Total Fractions Prescribed: 28
Plan Total Fractions Prescribed: 28
Plan Total Prescribed Dose: 50.4 Gy
Plan Total Prescribed Dose: 50.4 Gy
Reference Point Dosage Given to Date: 46.8 Gy
Reference Point Dosage Given to Date: 46.8 Gy
Reference Point Session Dosage Given: 1.8 Gy
Reference Point Session Dosage Given: 1.8 Gy
Session Number: 26

## 2022-03-17 ENCOUNTER — Ambulatory Visit
Admission: RE | Admit: 2022-03-17 | Discharge: 2022-03-17 | Disposition: A | Discharge status: Home / Self Care | Payer: Medicare Other | Source: Ambulatory Visit | Attending: Radiation Oncology | Admitting: Radiation Oncology

## 2022-03-17 ENCOUNTER — Other Ambulatory Visit: Payer: Self-pay

## 2022-03-17 DIAGNOSIS — C50512 Malignant neoplasm of lower-outer quadrant of left female breast: Secondary | ICD-10-CM | POA: Diagnosis not present

## 2022-03-17 DIAGNOSIS — C50412 Malignant neoplasm of upper-outer quadrant of left female breast: Secondary | ICD-10-CM | POA: Diagnosis not present

## 2022-03-17 DIAGNOSIS — Z510 Encounter for antineoplastic radiation therapy: Secondary | ICD-10-CM | POA: Diagnosis not present

## 2022-03-17 DIAGNOSIS — Z170 Estrogen receptor positive status [ER+]: Secondary | ICD-10-CM | POA: Diagnosis not present

## 2022-03-17 LAB — RAD ONC ARIA SESSION SUMMARY
Course Elapsed Days: 40
Plan Fractions Treated to Date: 27
Plan Fractions Treated to Date: 27
Plan Prescribed Dose Per Fraction: 1.8 Gy
Plan Prescribed Dose Per Fraction: 1.8 Gy
Plan Total Fractions Prescribed: 28
Plan Total Fractions Prescribed: 28
Plan Total Prescribed Dose: 50.4 Gy
Plan Total Prescribed Dose: 50.4 Gy
Reference Point Dosage Given to Date: 48.6 Gy
Reference Point Dosage Given to Date: 48.6 Gy
Reference Point Session Dosage Given: 1.8 Gy
Reference Point Session Dosage Given: 1.8 Gy
Session Number: 27

## 2022-03-18 ENCOUNTER — Other Ambulatory Visit: Payer: Self-pay

## 2022-03-18 ENCOUNTER — Ambulatory Visit
Admission: RE | Admit: 2022-03-18 | Discharge: 2022-03-18 | Disposition: A | Discharge status: Home / Self Care | Payer: Medicare Other | Source: Ambulatory Visit | Attending: Radiation Oncology | Admitting: Radiation Oncology

## 2022-03-18 DIAGNOSIS — Z510 Encounter for antineoplastic radiation therapy: Secondary | ICD-10-CM | POA: Diagnosis not present

## 2022-03-18 DIAGNOSIS — C50512 Malignant neoplasm of lower-outer quadrant of left female breast: Secondary | ICD-10-CM | POA: Diagnosis not present

## 2022-03-18 DIAGNOSIS — Z170 Estrogen receptor positive status [ER+]: Secondary | ICD-10-CM | POA: Diagnosis not present

## 2022-03-18 DIAGNOSIS — C50412 Malignant neoplasm of upper-outer quadrant of left female breast: Secondary | ICD-10-CM | POA: Diagnosis not present

## 2022-03-18 LAB — RAD ONC ARIA SESSION SUMMARY
Course Elapsed Days: 41
Plan Fractions Treated to Date: 28
Plan Fractions Treated to Date: 28
Plan Prescribed Dose Per Fraction: 1.8 Gy
Plan Prescribed Dose Per Fraction: 1.8 Gy
Plan Total Fractions Prescribed: 28
Plan Total Fractions Prescribed: 28
Plan Total Prescribed Dose: 50.4 Gy
Plan Total Prescribed Dose: 50.4 Gy
Reference Point Dosage Given to Date: 50.4 Gy
Reference Point Dosage Given to Date: 50.4 Gy
Reference Point Session Dosage Given: 1.8 Gy
Reference Point Session Dosage Given: 1.8 Gy
Session Number: 28

## 2022-03-19 ENCOUNTER — Other Ambulatory Visit: Payer: Self-pay

## 2022-03-19 ENCOUNTER — Ambulatory Visit
Admission: RE | Admit: 2022-03-19 | Discharge: 2022-03-19 | Disposition: A | Discharge status: Home / Self Care | Payer: Medicare Other | Source: Ambulatory Visit | Attending: Radiation Oncology | Admitting: Radiation Oncology

## 2022-03-19 DIAGNOSIS — C50412 Malignant neoplasm of upper-outer quadrant of left female breast: Secondary | ICD-10-CM | POA: Diagnosis not present

## 2022-03-19 DIAGNOSIS — C50512 Malignant neoplasm of lower-outer quadrant of left female breast: Secondary | ICD-10-CM | POA: Diagnosis not present

## 2022-03-19 DIAGNOSIS — Z170 Estrogen receptor positive status [ER+]: Secondary | ICD-10-CM | POA: Diagnosis not present

## 2022-03-19 DIAGNOSIS — Z510 Encounter for antineoplastic radiation therapy: Secondary | ICD-10-CM | POA: Diagnosis not present

## 2022-03-19 LAB — RAD ONC ARIA SESSION SUMMARY
Course Elapsed Days: 42
Plan Fractions Treated to Date: 1
Plan Prescribed Dose Per Fraction: 2 Gy
Plan Total Fractions Prescribed: 5
Plan Total Prescribed Dose: 10 Gy
Reference Point Dosage Given to Date: 2 Gy
Reference Point Session Dosage Given: 2 Gy
Session Number: 29

## 2022-03-20 ENCOUNTER — Other Ambulatory Visit: Payer: Self-pay

## 2022-03-20 ENCOUNTER — Ambulatory Visit
Admission: RE | Admit: 2022-03-20 | Discharge: 2022-03-20 | Disposition: A | Discharge status: Home / Self Care | Payer: Medicare Other | Source: Ambulatory Visit | Attending: Radiation Oncology | Admitting: Radiation Oncology

## 2022-03-20 ENCOUNTER — Ambulatory Visit: Payer: Medicare Other | Attending: Interventional Cardiology

## 2022-03-20 ENCOUNTER — Ambulatory Visit: Payer: Medicare Other

## 2022-03-20 DIAGNOSIS — I5032 Chronic diastolic (congestive) heart failure: Secondary | ICD-10-CM

## 2022-03-20 DIAGNOSIS — Z510 Encounter for antineoplastic radiation therapy: Secondary | ICD-10-CM | POA: Diagnosis not present

## 2022-03-20 DIAGNOSIS — Z170 Estrogen receptor positive status [ER+]: Secondary | ICD-10-CM | POA: Diagnosis not present

## 2022-03-20 DIAGNOSIS — Z7901 Long term (current) use of anticoagulants: Secondary | ICD-10-CM

## 2022-03-20 DIAGNOSIS — C50412 Malignant neoplasm of upper-outer quadrant of left female breast: Secondary | ICD-10-CM | POA: Diagnosis not present

## 2022-03-20 DIAGNOSIS — C50512 Malignant neoplasm of lower-outer quadrant of left female breast: Secondary | ICD-10-CM | POA: Diagnosis not present

## 2022-03-20 LAB — BASIC METABOLIC PANEL
BUN/Creatinine Ratio: 14 (ref 12–28)
BUN: 12 mg/dL (ref 8–27)
CO2: 22 mmol/L (ref 20–29)
Calcium: 9.4 mg/dL (ref 8.7–10.3)
Chloride: 106 mmol/L (ref 96–106)
Creatinine, Ser: 0.84 mg/dL (ref 0.57–1.00)
Glucose: 100 mg/dL — ABNORMAL HIGH (ref 70–99)
Potassium: 4.1 mmol/L (ref 3.5–5.2)
Sodium: 141 mmol/L (ref 134–144)
eGFR: 70 mL/min/{1.73_m2} (ref >59)

## 2022-03-20 LAB — RAD ONC ARIA SESSION SUMMARY
Course Elapsed Days: 43
Plan Fractions Treated to Date: 2
Plan Prescribed Dose Per Fraction: 2 Gy
Plan Total Fractions Prescribed: 5
Plan Total Prescribed Dose: 10 Gy
Reference Point Dosage Given to Date: 4 Gy
Reference Point Session Dosage Given: 2 Gy
Session Number: 30

## 2022-03-20 MED ORDER — RADIAPLEXRX EX GEL: Freq: Once | CUTANEOUS | Status: AC

## 2022-03-23 ENCOUNTER — Ambulatory Visit
Admission: RE | Admit: 2022-03-23 | Discharge: 2022-03-23 | Disposition: A | Discharge status: Home / Self Care | Payer: Medicare Other | Source: Ambulatory Visit | Attending: Radiation Oncology | Admitting: Radiation Oncology

## 2022-03-23 ENCOUNTER — Ambulatory Visit: Payer: Medicare Other

## 2022-03-23 ENCOUNTER — Other Ambulatory Visit: Payer: Self-pay

## 2022-03-23 DIAGNOSIS — C50512 Malignant neoplasm of lower-outer quadrant of left female breast: Secondary | ICD-10-CM | POA: Diagnosis not present

## 2022-03-23 DIAGNOSIS — Z510 Encounter for antineoplastic radiation therapy: Secondary | ICD-10-CM | POA: Diagnosis not present

## 2022-03-23 DIAGNOSIS — Z170 Estrogen receptor positive status [ER+]: Secondary | ICD-10-CM | POA: Diagnosis not present

## 2022-03-23 LAB — RAD ONC ARIA SESSION SUMMARY
Course Elapsed Days: 46
Plan Fractions Treated to Date: 3
Plan Prescribed Dose Per Fraction: 2 Gy
Plan Total Fractions Prescribed: 5
Plan Total Prescribed Dose: 10 Gy
Reference Point Dosage Given to Date: 6 Gy
Reference Point Session Dosage Given: 2 Gy
Session Number: 31

## 2022-03-24 ENCOUNTER — Other Ambulatory Visit: Payer: Self-pay

## 2022-03-24 ENCOUNTER — Inpatient Hospital Stay (HOSPITAL_BASED_OUTPATIENT_CLINIC_OR_DEPARTMENT_OTHER): Payer: Medicare Other | Admitting: Hematology and Oncology

## 2022-03-24 ENCOUNTER — Ambulatory Visit
Admission: RE | Admit: 2022-03-24 | Discharge: 2022-03-24 | Disposition: A | Discharge status: Home / Self Care | Payer: Medicare Other | Source: Ambulatory Visit | Attending: Radiation Oncology | Admitting: Radiation Oncology

## 2022-03-24 ENCOUNTER — Encounter: Payer: Self-pay | Admitting: *Deleted

## 2022-03-24 VITALS — BP 140/83 | HR 59 | Temp 97.2°F | Resp 18 | Ht 65.5 in | Wt 170.4 lb

## 2022-03-24 DIAGNOSIS — Z170 Estrogen receptor positive status [ER+]: Secondary | ICD-10-CM | POA: Insufficient documentation

## 2022-03-24 DIAGNOSIS — Z79811 Long term (current) use of aromatase inhibitors: Secondary | ICD-10-CM | POA: Insufficient documentation

## 2022-03-24 DIAGNOSIS — C50512 Malignant neoplasm of lower-outer quadrant of left female breast: Secondary | ICD-10-CM | POA: Insufficient documentation

## 2022-03-24 DIAGNOSIS — Z510 Encounter for antineoplastic radiation therapy: Secondary | ICD-10-CM | POA: Diagnosis not present

## 2022-03-24 LAB — RAD ONC ARIA SESSION SUMMARY
Course Elapsed Days: 47
Plan Fractions Treated to Date: 4
Plan Prescribed Dose Per Fraction: 2 Gy
Plan Total Fractions Prescribed: 5
Plan Total Prescribed Dose: 10 Gy
Reference Point Dosage Given to Date: 8 Gy
Reference Point Session Dosage Given: 2 Gy
Session Number: 32

## 2022-03-24 NOTE — Progress Notes (Signed)
                                                                                                                                                                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 She developed fatigue and anticipated skin changes in the treatment field.   Plan: The patient will receive a call in about one month from the radiation oncology department. She will continue follow up with Dr. Lindi Adie as well.  ________________________________________________    Carola Rhine, [facility]

## 2022-03-24 NOTE — Progress Notes (Signed)
Patient Care Team: Crist Infante, MD as PCP - General (Internal Medicine) Thompson Grayer, MD as PCP - Electrophysiology (Cardiology) Belva Crome, MD as PCP - Cardiology (Cardiology) Rockwell Germany, RN as Oncology Nurse Navigator Mauro Kaufmann, RN as Oncology Nurse Navigator Nicholas Lose, MD as Consulting Physician (Hematology and Oncology) Rolm Bookbinder, MD as Consulting Physician (General Surgery)  DIAGNOSIS:  Encounter Diagnosis  Name Primary?   Malignant neoplasm of lower-outer quadrant of left breast of female, estrogen receptor positive ([place]) Yes    SUMMARY OF ONCOLOGIC HISTORY: Oncology History  Malignant neoplasm of lower-outer quadrant of left breast of female, estrogen receptor positive ([place])  07/18/2021 Initial Diagnosis   Left breast global asymmetry/density measuring 8 x 4.4 x 4.7 cm.  Ultrasound showed 2 focal masses.  3.1 cm: Biopsy grade 2 ILC ER 90%, PR 10%, HER2 equivocal, FISH negative, Ki-67 10%; lymph node biopsy: Positive Breast MRI abnormality left breast measured 8 x 4.2 x 4.1 cm    Genetic Testing   [facility] CancerNext Panel was Negative. 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 Report date is 09/11/2021.  The CancerNext gene panel offered by [employer] includes sequencing, rearrangement analysis, and RNA analysis for the following 36 genes:   APC, ATM, AXIN2, BARD1, BMPR1A, BRCA1, BRCA2, BRIP1, CDH1, CDK4, CDKN2A, CHEK2, DICER1, HOXB13, EPCAM, GREM1, MLH1, MSH2, MSH3, MSH6, MUTYH, NBN, NF1, NTHL1, PALB2, PMS2, POLD1, POLE, PTEN, RAD51C, RAD51D, RECQL, SMAD4, SMARCA4, STK11, and TP53.    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 with the above mentioned left breast cancer. She presents to the clinic for a follow-up after radiation. She states that she had some radiation dermatitis. But overall she did great. She had no complaints at all. She states that she is tolerating the letrozole extremely well with no symptoms or concerns.    ALLERGIES:  is allergic to cashew nut oil and cashew nut (anacardium occidentale) skin test.  MEDICATIONS:  Current Outpatient Medications  Medication Sig Dispense Refill   acetaminophen (TYLENOL) 500 MG tablet Take 1,000 mg by mouth every 6 (six) hours as needed for moderate pain.     amiodarone (PACERONE) 200 MG tablet Take 1 tablet (200 mg total) by mouth 3 (three) times a week. Take on Mondays, Wednesdays, and Fridays.     amoxicillin (AMOXIL) 500 MG capsule Take 4 capsules (2,000 mg total) by mouth See admin instructions. Take 4 capsules (2000 mg) by mouth 1 hour prior to dental appointments 12 capsule 12   apixaban (ELIQUIS) 5 MG TABS tablet Take 1 tablet (5 mg total) by mouth 2 (two) times daily. Resume Apixaban at 8 PM 06/19/2921 60 tablet 11   b complex vitamins capsule Take 1 capsule by mouth daily.     B-D TB SYRINGE 1CC/27GX1/2" 27G X 1/2" 1 ML MISC Inject into the skin once a  week.     Calcium Carb-Cholecalciferol (CALCIUM + D3 PO) Take 1 tablet by mouth in the morning.     cholecalciferol (VITAMIN D3) 25 MCG (1000 UNIT) tablet Take 1,000 Units by mouth in the morning.     dapagliflozin propanediol (FARXIGA) 10 MG TABS tablet Take 1 tablet (10 mg total) by mouth daily before breakfast. 30 tablet 11    denosumab (PROLIA) 60 MG/ML SOSY injection Inject 60 mg into the skin every 6 (six) months.     dicyclomine (BENTYL) 10 MG capsule Take 10-20 mg by mouth 3 (three) times daily as needed for spasms.     folic acid (FOLVITE) 1 MG tablet Take 2 mg by mouth in the morning.     letrozole (FEMARA) 2.5 MG tablet Take 1 tablet (2.5 mg total) by mouth daily. (Patient taking differently: Take 2.5 mg by mouth every evening.) 90 tablet 3   levothyroxine (SYNTHROID) 100 MCG tablet Take 100 mcg by mouth daily before breakfast.     loperamide (IMODIUM) 2 MG capsule Take 2-4 mg by mouth 4 (four) times daily as needed (upset stomach/diarrhea/loose stools).     methotrexate 50 MG/2ML injection Inject 25 mg into the skin every Wednesday.     metoprolol succinate (TOPROL XL) 25 MG 24 hr tablet Take 1 tablet (25 mg total) by mouth daily. 90 tablet 3   mometasone (ELOCON) 0.1 % ointment Apply topically to vulvar skin no more than twice weekly 45 g 1   nitroGLYCERIN (NITROSTAT) 0.4 MG SL tablet Place 1 tablet (0.4 mg total) under the tongue every 5 (five) minutes as needed for chest pain. 25 tablet 3   nystatin cream (MYCOSTATIN) Apply 1 application. topically 2 (two) times daily. Apply to affected area BID for up to 7 days to skin under breasts 30 g 1   REPATHA SURECLICK 974 MG/ML SOAJ Inject 140 mg into the skin every 14 (fourteen) days.     telmisartan (MICARDIS) 20 MG tablet Take 20 mg by mouth every evening.     traMADol (ULTRAM) 50 MG tablet Take 1 tablet (50 mg total) by mouth every 6 (six) hours as needed. 10 tablet 0   zolpidem (AMBIEN) 5 MG tablet Take 2.5 mg by mouth at bedtime.     No current facility-administered medications for this visit.    PHYSICAL EXAMINATION: ECOG PERFORMANCE STATUS: 1 - Symptomatic but completely ambulatory  Vitals:   03/24/22 0851  BP: (!) 140/83  Pulse: (!) 59  Resp: 18  Temp: (!) 97.2 F (36.2 C)  SpO2: 100%   Filed Weights   03/24/22 0851  Weight: 170 lb 6.4 oz  (77.3 kg)      LABORATORY DATA:  I have reviewed the data as listed    Latest Ref Rng & Units 03/20/2022    9:31 AM 12/26/2021   12:08 PM 08/20/2021    1:59 AM  CMP  Glucose 70 - 99 mg/dL 100  145  120   BUN 8 - 27 mg/dL _0 Creatinine 0.57 - 1.00 mg/dL 0.84  0.81  0.88   Sodium 134 - 144 mmol/L 141  141  139   Potassium 3.5 - 5.2 mmol/L 4.1  4.0  3.8   Chloride 96 - 106 mmol/L 106  109  112   CO2 20 - 29 mmol/L _1 Calcium 8.7 - 10.3 mg/dL 9.4  9.7  8.4     Lab Results  Component Value  Date   WBC 6.6 12/25/2021   HGB 11.3 (L) 12/25/2021   HCT 34.9 (L) 12/25/2021   MCV 109.4 (H) 12/25/2021   PLT 219 12/25/2021   NEUTROABS 5.2 07/13/2021    ASSESSMENT & PLAN:  Malignant neoplasm of lower-outer quadrant of left breast of female, estrogen receptor positive ([place]) 12/29/2021: Left lumpectomy: 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  Adjuvant radiation therapy: 02/06/2022-03/26/2022 2. antiestrogen therapy: Started as neoadjuvant therapy with letrozole.  She tolerated it very well.  She will continue on with the same.  Started 07/30/2021 -------------------------------------------------------------------------------------------------------------------------- Letrozole toxicities: Tolerating it extremely well without any side effects.  Denies any hot flashes.  Return to clinic in 3 months for survivorship care plan visit    No orders of the defined types were placed in this encounter.  The patient has a good  understanding of the overall plan. she agrees with it. she will call with any problems that may develop before the next visit here. Total time spent: 30 mins including face to face time and time spent for planning, charting and co-ordination of care   Harriette Ohara, MD 03/24/22    I Gardiner Coins am scribing for Dr. Lindi Adie  I have reviewed the above documentation for accuracy and completeness, and I agree with the above.

## 2022-03-24 NOTE — Assessment & Plan Note (Addendum)
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  Adjuvant radiation therapy: 02/06/2022-03/26/2022 2. antiestrogen therapy: Started as neoadjuvant therapy with letrozole.  She tolerated it very well.  She will continue on with the same. -------------------------------------------------------------------------------------------------------------------------- Letrozole toxicities: Tolerating it extremely well without any side effects.  Denies any hot flashes.  Return to clinic in 3 months for survivorship care plan visit

## 2022-03-25 ENCOUNTER — Ambulatory Visit: Payer: Medicare Other

## 2022-03-25 ENCOUNTER — Ambulatory Visit
Admission: RE | Admit: 2022-03-25 | Discharge: 2022-03-25 | Disposition: A | Discharge status: Home / Self Care | Payer: Medicare Other | Source: Ambulatory Visit | Attending: Radiation Oncology | Admitting: Radiation Oncology

## 2022-03-25 ENCOUNTER — Other Ambulatory Visit: Payer: Self-pay

## 2022-03-25 ENCOUNTER — Encounter: Payer: Self-pay | Admitting: Radiation Oncology

## 2022-03-25 ENCOUNTER — Encounter: Payer: Self-pay | Admitting: *Deleted

## 2022-03-25 DIAGNOSIS — C50512 Malignant neoplasm of lower-outer quadrant of left female breast: Secondary | ICD-10-CM | POA: Diagnosis not present

## 2022-03-25 DIAGNOSIS — C50412 Malignant neoplasm of upper-outer quadrant of left female breast: Secondary | ICD-10-CM | POA: Diagnosis not present

## 2022-03-25 DIAGNOSIS — Z170 Estrogen receptor positive status [ER+]: Secondary | ICD-10-CM

## 2022-03-25 DIAGNOSIS — Z510 Encounter for antineoplastic radiation therapy: Secondary | ICD-10-CM | POA: Diagnosis not present

## 2022-03-25 LAB — RAD ONC ARIA SESSION SUMMARY
Course Elapsed Days: 48
Plan Fractions Treated to Date: 5
Plan Prescribed Dose Per Fraction: 2 Gy
Plan Total Fractions Prescribed: 5
Plan Total Prescribed Dose: 10 Gy
Reference Point Dosage Given to Date: 10 Gy
Reference Point Session Dosage Given: 2 Gy
Session Number: 33

## 2022-03-26 ENCOUNTER — Ambulatory Visit: Payer: Medicare Other

## 2022-03-26 ENCOUNTER — Encounter: Payer: Self-pay | Admitting: Pulmonary Disease

## 2022-03-31 ENCOUNTER — Telehealth: Payer: Self-pay | Admitting: Interventional Cardiology

## 2022-03-31 NOTE — Telephone Encounter (Signed)
Left message for patient to return call.  Advised her to callback and request to speak with Triage Nurse tomorrow (04/01/22) to leave detailed message for me and Dr. Tamala Julian with an update on how she is feeling and how her breathing has been.  Provided office number for callback.   Per Dr. Tamala Julian from 03/20/22 BMET results: Let the patient know the blood work looks fine. Please let us know how she feels and whether breathing improved?

## 2022-03-31 NOTE — Telephone Encounter (Signed)
Patient is returning call. Pleas advise

## 2022-04-09 NOTE — Telephone Encounter (Signed)
Received handwritten note from patient stating she is still experiencing shortness of breath. Her radiation treatment is over, went well.  She states her son recommended she see Dr. Haroldine Laws as her new cardiologist. She writes "I did rather flip out  when told I was in level 2 heart failure and since I'm not breathing any better was concerned."  She also writes "trying to avoid methotrexate--seeing Dr. Amil Amen after Christmas."  Will forward to Dr. Tamala Julian to review and advise.

## 2022-04-15 ENCOUNTER — Telehealth: Payer: Self-pay | Admitting: Pharmacy Technician

## 2022-04-15 NOTE — Telephone Encounter (Signed)
Spoke with patient and shared Dr. Thompson Caul reply with her:  I am okay with referral to Dr. Haroldine Laws or Dr. Johney Frame. If son prefers Dr. B, I think it would be great to get his thoughts.    Patient has appt with Dr. Haroldine Laws on 05/06/2022, she will keep this appointment.  Patient expressed appreciation for follow-up.

## 2022-04-17 DIAGNOSIS — Z23 Encounter for immunization: Secondary | ICD-10-CM | POA: Diagnosis not present

## 2022-04-22 DIAGNOSIS — Z6827 Body mass index (BMI) 27.0-27.9, adult: Secondary | ICD-10-CM | POA: Diagnosis not present

## 2022-04-22 DIAGNOSIS — E663 Overweight: Secondary | ICD-10-CM | POA: Diagnosis not present

## 2022-04-22 DIAGNOSIS — M353 Polymyalgia rheumatica: Secondary | ICD-10-CM | POA: Diagnosis not present

## 2022-04-22 DIAGNOSIS — M8580 Other specified disorders of bone density and structure, unspecified site: Secondary | ICD-10-CM | POA: Diagnosis not present

## 2022-04-22 DIAGNOSIS — M316 Other giant cell arteritis: Secondary | ICD-10-CM | POA: Diagnosis not present

## 2022-04-22 DIAGNOSIS — Z7952 Long term (current) use of systemic steroids: Secondary | ICD-10-CM | POA: Diagnosis not present

## 2022-05-04 ENCOUNTER — Ambulatory Visit
Admission: RE | Admit: 2022-05-04 | Discharge: 2022-05-04 | Disposition: A | Discharge status: Home / Self Care | Payer: Medicare Other | Source: Ambulatory Visit | Attending: Radiation Oncology | Admitting: Radiation Oncology

## 2022-05-04 NOTE — Progress Notes (Signed)
  Radiation Oncology         [phone] ________________________________  Name: Marissa Powers MRN: [id number]  Date of Service: 05/04/2022  DOB: 18-Dec-1940  Post Treatment Telephone Note  Diagnosis:  Stage IB, cT3N1M0, grade 2 invasive lobular carcinoma of the left breast   Intent: Curative  Radiation Treatment Dates: 02/05/2022 through 03/25/2022 Site Technique Total Dose (Gy) Dose per Fx (Gy) Completed Fx Beam Energies  Breast, Left: Breast_L 3D 50.4/50.4 1.8 28/28 10XFFF  Breast, Left: Breast_L_SCV_PAB 3D 50.4/50.4 1.8 28/28 6X, 10X  Breast, Left: Breast_L_Bst 3D 10/10 2 5/5 6X, 10X  (as documented in provider EOT note)   The patient was available for call today.   Symptoms of fatigue have improved since completing therapy.  Symptoms of skin changes have improved since completing therapy.  The patient was encouraged to avoid sun exposure in the area of prior treatment for up to one year following radiation with either sunscreen or by the style of clothing worn in the sun.  The patient has scheduled follow up with her medical oncologist Dr. Lindi Adie for ongoing surveillance, and was encouraged to call if she develops concerns or questions regarding radiation.   This concludes the interview.   Leandra Kern, LPN

## 2022-05-06 ENCOUNTER — Ambulatory Visit (INDEPENDENT_AMBULATORY_CARE_PROVIDER_SITE_OTHER): Payer: Medicare Other

## 2022-05-06 ENCOUNTER — Ambulatory Visit (HOSPITAL_COMMUNITY)
Admission: RE | Admit: 2022-05-06 | Discharge: 2022-05-06 | Disposition: A | Discharge status: Home / Self Care | Payer: Medicare Other | Source: Ambulatory Visit | Attending: Internal Medicine | Admitting: Internal Medicine

## 2022-05-06 ENCOUNTER — Encounter (HOSPITAL_COMMUNITY): Payer: Self-pay | Admitting: Internal Medicine

## 2022-05-06 VITALS — BP 179/84 | HR 63 | Temp 97.7°F | Resp 18 | Ht 65.0 in | Wt 168.0 lb

## 2022-05-06 VITALS — BP 170/88 | HR 69 | Wt 171.0 lb

## 2022-05-06 DIAGNOSIS — Z952 Presence of prosthetic heart valve: Secondary | ICD-10-CM | POA: Diagnosis not present

## 2022-05-06 DIAGNOSIS — I350 Nonrheumatic aortic (valve) stenosis: Secondary | ICD-10-CM | POA: Diagnosis not present

## 2022-05-06 DIAGNOSIS — I5032 Chronic diastolic (congestive) heart failure: Secondary | ICD-10-CM | POA: Diagnosis not present

## 2022-05-06 DIAGNOSIS — I10 Essential (primary) hypertension: Secondary | ICD-10-CM | POA: Diagnosis not present

## 2022-05-06 DIAGNOSIS — M810 Age-related osteoporosis without current pathological fracture: Secondary | ICD-10-CM

## 2022-05-06 MED ORDER — DENOSUMAB 60 MG/ML ~~LOC~~ SOSY
60.0000 mg | PREFILLED_SYRINGE | Freq: Once | SUBCUTANEOUS | Status: AC
  Administered 2022-05-06: 60 mg via SUBCUTANEOUS
  Filled 2022-05-06: qty 1

## 2022-05-06 NOTE — Patient Instructions (Signed)
There has been no changes to your medications.  You have been referred to Cardiac Rehab. They will call you to arrange your appointment  Your physician recommends that you schedule a follow-up appointment in: 4 months (May 2024) ** please call the office in March to arrange your follow up appointment **  If you have any questions or concerns before your next appointment please send [place] a message through [place] or call our office at [phone].    TO LEAVE A MESSAGE FOR THE NURSE SELECT OPTION 2, PLEASE LEAVE A MESSAGE INCLUDING: YOUR NAME DATE OF BIRTH CALL BACK NUMBER REASON FOR CALL**this is important as we prioritize the call backs  YOU WILL RECEIVE A CALL BACK THE SAME DAY AS LONG AS YOU CALL BEFORE 4:00 PM  At the [facility], you and your health needs are our priority. As part of our continuing mission to provide you with exceptional heart care, we have created designated Provider Care Teams. These Care Teams include your primary Cardiologist (physician) and Advanced Practice Providers (APPs- Physician Assistants and Nurse Practitioners) who all work together to provide you with the care you need, when you need it.   You may see any of the following providers on your designated Care Team at your next follow up: Dr Glori Bickers Dr Loralie Champagne Dr. Roxana Hires, NP Lyda Jester, [place] [facility], [place] Forestine Na, NP Audry Riles, PharmD   Please be sure to bring in all your medications bottles to every appointment.

## 2022-05-06 NOTE — Progress Notes (Signed)
ADVANCED HF CLINIC CONSULT NOTE  Referring Physician: Dr. Tamala Julian Primary Care: Crist Infante, MD Primary Cardiologist: Dr. Tamala Julian  HPI:  Marissa Powers is a 82 y.o. 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 Referred by Dr. Tamala Julian for further evaluation of her HF.     Echo 6/23 EF 55-60% G2 DD, RV ok. TAVR OK.   Pre TAVR cath 3/23  LM, LAD and RCA ok . D1 70-80% RHC  RA 5 PA 38/10 (24) PW 12 Fick 6.6/3.4 LVEDP 21  Recently diagnosed with Ib breast CA s/p lumpectomy being treated with XRT 02/06/2022-03/26/2022 and letrozole  Overall doing ok but gets out of breath very easily. Says she walks around the block wither dog and has to stop to catch her breath. Hasn't gotten much better since TAVR. No edema, orthopnea or PND. Non-smoker.      Review of Systems: [y] = yes, '[ ]'$  = no   General: Weight gain '[ ]'$ ; Weight loss '[ ]'$ ; Anorexia '[ ]'$ ; Fatigue '[ ]'$ ; Fever '[ ]'$ ; Chills '[ ]'$ ; Weakness '[ ]'$   Cardiac: Chest pain/pressure '[ ]'$ ; Resting SOB '[ ]'$ ; Exertional SOB '[ ]'$ ; Orthopnea '[ ]'$ ; Pedal Edema '[ ]'$ ; Palpitations '[ ]'$ ; Syncope '[ ]'$ ; Presyncope '[ ]'$ ; Paroxysmal nocturnal dyspnea'[ ]'$   Pulmonary: Cough '[ ]'$ ; Wheezing'[ ]'$ ; Hemoptysis'[ ]'$ ; Sputum '[ ]'$ ; Snoring '[ ]'$   GI: Vomiting'[ ]'$ ; Dysphagia'[ ]'$ ; Melena'[ ]'$ ; Hematochezia '[ ]'$ ; Heartburn'[ ]'$ ; Abdominal pain '[ ]'$ ; Constipation '[ ]'$ ; Diarrhea '[ ]'$ ; BRBPR '[ ]'$   GU: Hematuria'[ ]'$ ; Dysuria '[ ]'$ ; Nocturia'[ ]'$   Vascular: Pain in legs with walking '[ ]'$ ; Pain in feet with lying flat '[ ]'$ ; Non-healing sores '[ ]'$ ; Stroke '[ ]'$ ; TIA '[ ]'$ ; Slurred speech '[ ]'$ ;  Neuro: Headaches'[ ]'$ ; Vertigo'[ ]'$ ; Seizures'[ ]'$ ; Paresthesias'[ ]'$ ;Blurred vision '[ ]'$ ; Diplopia '[ ]'$ ; Vision changes '[ ]'$   Ortho/Skin: Arthritis '[ ]'$ ; Joint pain '[ ]'$ ; Muscle  pain '[ ]'$ ; Joint swelling '[ ]'$ ; Back Pain '[ ]'$ ; Rash '[ ]'$   Psych: Depression'[ ]'$ ; Anxiety'[ ]'$   Heme: Bleeding problems '[ ]'$ ; Clotting disorders '[ ]'$ ; Anemia '[ ]'$   Endocrine: Diabetes '[ ]'$ ; Thyroid dysfunction'[ ]'$    Past Medical History:  Diagnosis Date   Arthritis    osteoarthritis   Breast cancer ([place]) 11/2021   CAD (coronary artery disease)    Descending thoracic aortic dissection ([facility])    followed by Dr. 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 Angelena Form & Dr. Cyndia Bent .   Severe aortic stenosis     Current Outpatient Medications  Medication Sig Dispense Refill   acetaminophen (TYLENOL) 500 MG tablet Take 1,000 mg by mouth every 6 (six) hours as  needed for moderate pain.     amiodarone (PACERONE) 200 MG tablet Take 1 tablet (200 mg total) by mouth 3 (three) times a week. Take on Mondays, Wednesdays, and Fridays.     apixaban (ELIQUIS) 5 MG TABS tablet Take 1 tablet (5 mg total) by mouth 2 (two) times daily. Resume Apixaban at 8 PM 06/19/2921 60 tablet 11   b complex vitamins capsule Take 1 capsule by mouth daily.     Calcium Carb-Cholecalciferol (CALCIUM + D3 PO) Take 1 tablet by mouth in the morning.     cholecalciferol (VITAMIN D3) 25 MCG (1000 UNIT) tablet Take 1,000 Units by mouth in the morning.     dapagliflozin propanediol (FARXIGA) 10 MG TABS tablet Take 1 tablet (10 mg total) by mouth daily before breakfast. 30 tablet 11   denosumab (PROLIA) 60 MG/ML SOSY injection Inject 60 mg into the skin every 6 (six)  months.     dicyclomine (BENTYL) 10 MG capsule Take 10-20 mg by mouth 3 (three) times daily as needed for spasms.     folic acid (FOLVITE) 1 MG tablet Take 2 mg by mouth in the morning.     letrozole (FEMARA) 2.5 MG tablet Take 1 tablet (2.5 mg total) by mouth daily. 90 tablet 3   levothyroxine (SYNTHROID) 100 MCG tablet Take 100 mcg by mouth daily before breakfast.     loperamide (IMODIUM) 2 MG capsule Take 2-4 mg by mouth 4 (four) times daily as needed (upset stomach/diarrhea/loose stools).     metoprolol succinate (TOPROL XL) 25 MG 24 hr tablet Take 1 tablet (25 mg total) by mouth daily. 90 tablet 3   mometasone (ELOCON) 0.1 % ointment Apply topically to vulvar skin no more than twice weekly 45 g 1   nitroGLYCERIN (NITROSTAT) 0.4 MG SL tablet Place 1 tablet (0.4 mg total) under the tongue every 5 (five) minutes as needed for chest pain. 25 tablet 3   nystatin cream (MYCOSTATIN) Apply 1 Application topically as needed for dry skin.     REPATHA SURECLICK 295 MG/ML SOAJ Inject 140 mg into the skin every 14 (fourteen) days.     telmisartan (MICARDIS) 40 MG tablet Take 40 mg by mouth daily.     traMADol (ULTRAM) 50 MG tablet Take 1 tablet (50 mg total) by mouth every 6 (six) hours as needed. 10 tablet 0   zolpidem (AMBIEN) 5 MG tablet Take 2.5 mg by mouth at bedtime.     No current facility-administered medications for this encounter.    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 Hives      Social History   Socioeconomic History   Marital status: Widowed    Spouse name: Not on file   Number of children: 2   Years of education: Not on file   Highest education level: Not on file  Occupational History   Not on file  Tobacco Use   Smoking status: Never    Passive exposure: Never   Smokeless tobacco: Never  Vaping Use   Vaping Use: Never used  Substance  and Sexual Activity   Alcohol use: Yes    Alcohol/week: 4.0 - 6.0 standard drinks of alcohol    Types: 4 - 6 Standard drinks or equivalent per week   Drug use: No   Sexual activity: Not Currently    Partners: Male    Birth control/protection:  Post-menopausal  Other Topics Concern   Not on file  Social History Narrative   Not on file   Social Determinants of Health   Financial Resource Strain: Low Risk  (08/05/2021)   Overall Financial Resource Strain (CARDIA)    Difficulty of Paying Living Expenses: Not hard at all  Food Insecurity: No Food Insecurity (08/05/2021)   Hunger Vital Sign    Worried About Running Out of Food in the Last Year: Never true    Ran Out of Food in the Last Year: Never true  Transportation Needs: No Transportation Needs (08/05/2021)   PRAPARE - Hydrologist (Medical): No    Lack of Transportation (Non-Medical): No  Physical Activity: Not on file  Stress: Not on file  Social Connections: Not on file  Intimate Partner Violence: Not on file      Family History  Problem Relation Age of Onset   Aortic aneurysm Mother    Clotting disorder Mother    Healthy Father    Heart disease Brother        stints   Breast cancer Maternal Aunt 59   Breast cancer Maternal Grandmother 69   Heart attack Maternal Grandmother     Vitals:   05/06/22 1449  BP: (!) 170/88  Pulse: 69  SpO2: 96%  Weight: 77.6 kg (171 lb)    PHYSICAL EXAM: General:  Well appearing. No respiratory difficulty HEENT: normal Neck: supple. no JVD. Carotids 2+ bilat; no bruits. No lymphadenopathy or thryomegaly appreciated. Cor: PMI nondisplaced. Regular rate & rhythm. No rubs, gallops or murmurs. Lungs: clear Abdomen: soft, nontender, nondistended. No hepatosplenomegaly. No bruits or masses. Good bowel sounds. Extremities: no cyanosis, clubbing, rash, edema Neuro: alert & oriented x 3, cranial nerves grossly intact. moves all 4 extremities w/o difficulty.  Affect pleasant.  ECG:   ASSESSMENT & PLAN:  1. Chronic diastolic HF - On micardis 40 - On Farxiga 10   2. Aortic stenosis s/p TAVR 5/23 - stable - refer CR  3. PAF - in NSR on amio 200 MWF (planned to stop in 4/24) - Continue eliquis  4. CAD, non obstructive - cath 9/23 D1 70-80%    6. Descending thoracic aortic dissection  - stable by CT 7/23 - followed by Dr. Trula Slade   7. HTN - high recently    Glori Bickers, MD  3:38 PM

## 2022-05-06 NOTE — Progress Notes (Signed)
ReDS Vest / Clip - 05/06/22 1600       ReDS Vest / Clip   Station Marker A    Ruler Value 33    ReDS Value Range Low volume    ReDS Actual Value 23

## 2022-05-06 NOTE — Progress Notes (Signed)
Diagnosis: Osteoporosis  Provider:  Marshell Garfinkel MD  Procedure: Injection  Prolia (Denosumab), Dose: 60 mg, Site: subcutaneous, Number of injections: 1  Post Care:  n/a  Discharge: Condition: Good, Destination: Home . AVS provided to patient.   Performed by:  Cleophus Molt, RN

## 2022-05-07 ENCOUNTER — Encounter: Payer: Self-pay | Admitting: Pulmonary Disease

## 2022-05-07 ENCOUNTER — Other Ambulatory Visit (HOSPITAL_COMMUNITY): Payer: Self-pay

## 2022-05-07 ENCOUNTER — Telehealth (HOSPITAL_COMMUNITY): Payer: Self-pay | Admitting: *Deleted

## 2022-05-07 ENCOUNTER — Encounter (HOSPITAL_COMMUNITY): Payer: Self-pay | Admitting: *Deleted

## 2022-05-07 MED ORDER — ENTRESTO 49-51 MG PO TABS: 1.0000 | ORAL_TABLET | Freq: Two times a day (BID) | ORAL | 3 refills | Status: DC

## 2022-05-07 NOTE — Telephone Encounter (Signed)
Per Dr Haroldine Laws pt needs to stop Micardis (telmisartan) and start Entresto 49/51 mg Twice daily, he spoke w/pt, new rx sent in, mychart message also sent

## 2022-05-22 DIAGNOSIS — Z170 Estrogen receptor positive status [ER+]: Secondary | ICD-10-CM | POA: Diagnosis not present

## 2022-05-22 DIAGNOSIS — C50512 Malignant neoplasm of lower-outer quadrant of left female breast: Secondary | ICD-10-CM | POA: Diagnosis not present

## 2022-05-22 DIAGNOSIS — Z9889 Other specified postprocedural states: Secondary | ICD-10-CM | POA: Diagnosis not present

## 2022-05-23 ENCOUNTER — Other Ambulatory Visit: Payer: Self-pay | Admitting: Hematology and Oncology

## 2022-05-25 NOTE — Telephone Encounter (Signed)
Refilled per Dr. Geralyn Flash OV note 03/24/22: antiestrogen therapy: Started as neoadjuvant therapy with letrozole.  She tolerated it very well.  She will continue on with the same.  Started 07/30/2021  Next appt with provider/survivorship:  06/23/22

## 2022-06-08 ENCOUNTER — Telehealth (HOSPITAL_COMMUNITY): Payer: Self-pay

## 2022-06-08 NOTE — Telephone Encounter (Signed)
Pt insurance is active and benefits verified through Medicare A/B. Co-pay $0.00, DED $240.00/$240.00 met, out of pocket $0.00/$0.00 met, co-insurance 20%. No pre-authorization required. [employer]port, 06/08/22 @ 9:55AM, REF#[id number]   How many CR sessions are covered? (36 sessions for TCR, 72 sessions for ICR)72 Is this a lifetime maximum or an annual maximum? Lifetime Has the member used any of these services to date? No Is there a time limit (weeks/months) on start of program and/or program completion? No   2ndary insurance is active and benefits verified through [facility] Medicare Suppl. Co-pay $0.00, DED $0.00/$0.00 met, out of pocket $0.00/$0.00 met, co-insurance 0%. No pre-authorization required.      Will contact patient to see if she is interested in the Cardiac Rehab Program.

## 2022-06-08 NOTE — Telephone Encounter (Signed)
Called patient to see if she was interested in participating in the Cardiac Rehab Program. Patient stated yes. Patient will come in for orientation on 06/09/22 @ 930AM and will attend the 12:30PM exercise class. Went over insurance, patient verbalized understanding.

## 2022-06-08 NOTE — Telephone Encounter (Addendum)
LVM for patient to confirm Cardiac Rehab appointment and complete the CR Nursing Assessment. Awaiting call back.   1515 pt returned call and Rn was able to complete her CR Nursing Assessment and confirm her appointment tomorrow.

## 2022-06-09 ENCOUNTER — Encounter (HOSPITAL_COMMUNITY)
Admission: RE | Admit: 2022-06-09 | Discharge: 2022-06-09 | Disposition: A | Discharge status: Home / Self Care | Payer: Medicare Other | Source: Ambulatory Visit | Attending: Internal Medicine | Admitting: Internal Medicine

## 2022-06-09 VITALS — BP 138/88 | HR 57 | Ht 66.0 in | Wt 168.4 lb

## 2022-06-09 DIAGNOSIS — Z952 Presence of prosthetic heart valve: Secondary | ICD-10-CM

## 2022-06-09 NOTE — Progress Notes (Signed)
Cardiac Individual Treatment Plan  Patient Details  Name: Marissa Powers MRN: [id number] Date of Birth: 1940-09-07 Referring Provider:   Flowsheet Row INTENSIVE CARDIAC REHAB ORIENT from 06/09/2022 in [facility]  Referring Provider Bensimhon, Shaune Pascal, MD       Initial Encounter Date:  [place] from 06/09/2022 in [facility]  Date 06/09/22       Visit Diagnosis: 08/19/21 TAVR (transcatheter aortic valve replacement)  Patient's Home Medications on Admission:  Current Outpatient Medications:    acetaminophen (TYLENOL) 500 MG tablet, Take 1,000 mg by mouth every 6 (six) hours as needed for moderate pain., Disp: , Rfl:    amiodarone (PACERONE) 200 MG tablet, Take 1 tablet (200 mg total) by mouth 3 (three) times a week. Take on Mondays, Wednesdays, and Fridays., Disp: , Rfl:    apixaban (ELIQUIS) 5 MG TABS tablet, Take 1 tablet (5 mg total) by mouth 2 (two) times daily. Resume Apixaban at 8 PM 06/19/2921, Disp: 60 tablet, Rfl: 11   b complex vitamins capsule, Take 1 capsule by mouth daily., Disp: , Rfl:    Calcium Carb-Cholecalciferol (CALCIUM + D3 PO), Take 1 tablet by mouth in the morning., Disp: , Rfl:    cholecalciferol (VITAMIN D3) 25 MCG (1000 UNIT) tablet, Take 1,000 Units by mouth in the morning., Disp: , Rfl:    dapagliflozin propanediol (FARXIGA) 10 MG TABS tablet, Take 1 tablet (10 mg total) by mouth daily before breakfast., Disp: 30 tablet, Rfl: 11   denosumab (PROLIA) 60 MG/ML SOSY injection, Inject 60 mg into the skin every 6 (six) months., Disp: , Rfl:    dicyclomine (BENTYL) 10 MG capsule, Take 10-20 mg by mouth 3 (three) times daily as needed for spasms., Disp: , Rfl:    folic acid (FOLVITE) 1 MG tablet, Take 2 mg by mouth in the morning., Disp: , Rfl:    letrozole (FEMARA) 2.5 MG tablet, TAKE 1 TABLET(2.5 MG) BY MOUTH DAILY,  Disp: 90 tablet, Rfl: 3   levothyroxine (SYNTHROID) 100 MCG tablet, Take 100 mcg by mouth daily before breakfast., Disp: , Rfl:    loperamide (IMODIUM) 2 MG capsule, Take 2-4 mg by mouth 4 (four) times daily as needed (upset stomach/diarrhea/loose stools)., Disp: , Rfl:    metoprolol succinate (TOPROL XL) 25 MG 24 hr tablet, Take 1 tablet (25 mg total) by mouth daily., Disp: 90 tablet, Rfl: 3   mometasone (ELOCON) 0.1 % ointment, Apply topically to vulvar skin no more than twice weekly, Disp: 45 g, Rfl: 1   nitroGLYCERIN (NITROSTAT) 0.4 MG SL tablet, Place 1 tablet (0.4 mg total) under the tongue every 5 (five) minutes as needed for chest pain., Disp: 25 tablet, Rfl: 3   nystatin cream (MYCOSTATIN), Apply 1 Application topically as needed for dry skin., Disp: , Rfl:    REPATHA SURECLICK [id number] MG/ML SOAJ, Inject 140 mg into the skin every 14 (fourteen) days., Disp: , Rfl:    sacubitril-valsartan (ENTRESTO) 49-51 MG, Take 1 tablet by mouth 2 (two) times daily., Disp: 60 tablet, Rfl: 3   traMADol (ULTRAM) 50 MG tablet, Take 1 tablet (50 mg total) by mouth every 6 (six) hours as needed., Disp: 10 tablet, Rfl: 0   zolpidem (AMBIEN) 5 MG tablet, Take 2.5 mg by mouth at bedtime., Disp: , Rfl:   Past Medical History: Past Medical History:  Diagnosis Date   Arthritis  osteoarthritis   Breast cancer ([place]) 11/2021   CAD (coronary artery disease)    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 Angelena Form & Dr. Cyndia Bent .   Severe aortic stenosis     Tobacco Use: Social History   Tobacco Use  Smoking Status Never   Passive exposure: Never  Smokeless Tobacco Never    Labs: Review Flowsheet  More data exists      Latest Ref Rng & Units 09/22/2018 10/28/2018 06/19/2021 07/11/2021 08/19/2021  Labs for ITP Cardiac and Pulmonary Rehab  Cholestrol 100 - 199 mg/dL 183  122  - - -  LDL (calc) 0 - 99 mg/dL 108  45  - - -  HDL-C >39 mg/dL 61  55  - - -  Trlycerides <150 mg/dL 71  108  - 100  -  PH, Arterial 7.35 - 7.45 - - 7.457  - -  PCO2 arterial 32 - 48 mmHg - - 33.4  - -  Bicarbonate 20.0 - 28.0 mmol/L 20.0 - 28.0 mmol/L - - 24.5  24.6  23.6  - -  TCO2 22 - 32 mmol/L - - '26  26  25  '$ - 24  22   Acid-base deficit 0.0 - 2.0 mmol/L 0.0 - 2.0 mmol/L - - 1.0  1.0  - -  O2 Saturation % % - - 74  75  99  - -    Capillary Blood Glucose: No results found for: "GLUCAP"   Exercise Target Goals: Exercise Program Goal: Individual exercise prescription set using results from initial 6 min walk test and THRR while considering  patient's activity barriers and safety.   Exercise Prescription Goal: Initial exercise prescription builds to 30-45 minutes a day of aerobic activity, 2-3 days per week.  Home exercise guidelines will be given to patient during program as part of exercise prescription that the participant will acknowledge.  Activity Barriers & Risk Stratification:  Activity Barriers & Cardiac Risk Stratification - 06/09/22 0924       Activity Barriers & Cardiac Risk Stratification   Activity Barriers Arthritis;Left Knee Replacement;Right Knee Replacement;Other (comment)    Comments Polymyalgia rheumatica    Cardiac Risk Stratification High             6 Minute Walk:  6 Minute Walk     Row Name 06/09/22 1015         6 Minute Walk   Phase Initial     Distance 1523 feet     Walk Time 6 minutes     # of Rest Breaks 0     MPH 2.88      METS 2.81     RPE 11     Perceived Dyspnea  1     VO2 Peak 9.85     Symptoms Yes (comment)  Comments Mild SOB, RPD=1.     Resting HR 57 bpm     Resting BP 138/88     Resting Oxygen Saturation  97 %     Exercise Oxygen Saturation  during 6 min walk 95 %     Max Ex. HR 88 bpm     Max Ex. BP 168/92     2 Minute Post BP 142/88              Oxygen Initial Assessment:   Oxygen Re-Evaluation:   Oxygen Discharge (Final Oxygen Re-Evaluation):   Initial Exercise Prescription:  Initial Exercise Prescription - 06/09/22 1100       Date of Initial Exercise RX and Referring Provider   Date 06/09/22    Referring Provider Jolaine Artist, MD    Expected Discharge Date 08/21/22      NuStep   Level 2    SPM 85    Minutes 15    METs 2.5      Recumbant Elliptical   Level 1    Minutes 15    METs 2.8      Prescription Details   Frequency (times per week) 3    Duration Progress to 30 minutes of continuous aerobic without signs/symptoms of physical distress      Intensity   THRR 40-80% of Max Heartrate 56-111    Ratings of Perceived Exertion 11-13    Perceived Dyspnea 0-4      Progression   Progression Continue to progress workloads to maintain intensity without signs/symptoms of physical distress.      Resistance Training   Training Prescription Yes    Weight 2 lbs    Reps 10-15             Perform Capillary Blood Glucose checks as needed.  Exercise Prescription Changes:   Exercise Comments:   Exercise Goals and Review:   Exercise Goals     Row Name 06/09/22 [id number]             Exercise Goals   Increase Physical Activity Yes       Intervention Provide advice, education, support and counseling about physical activity/exercise needs.;Develop an individualized exercise prescription for aerobic and resistive training based on initial evaluation findings, risk stratification, comorbidities and participant's personal goals.       Expected Outcomes  Short Term: Attend rehab on a regular basis to increase amount of physical activity.;Long Term: Add in home exercise to make exercise part of routine and to increase amount of physical activity.;Long Term: Exercising regularly at least 3-5 days a week.       Increase Strength and Stamina Yes       Intervention Provide advice, education, support and counseling about physical activity/exercise needs.;Develop an individualized exercise prescription for aerobic and resistive training based on initial evaluation findings, risk stratification, comorbidities and participant's personal goals.       Expected Outcomes Short Term: Increase workloads from initial exercise prescription for resistance, speed, and METs.;Short Term: Perform resistance training exercises routinely during rehab and add in resistance training at home;Long Term: Improve cardiorespiratory fitness, muscular endurance and strength as measured by increased METs and functional capacity (6MWT)       Able to understand and use rate of perceived exertion (RPE) scale Yes       Intervention Provide education and explanation on how to use RPE scale       Expected Outcomes Short Term: Able to use RPE daily in rehab to express subjective  intensity level;Long Term:  Able to use RPE to guide intensity level when exercising independently       Knowledge and understanding of Target Heart Rate Range (THRR) Yes       Intervention Provide education and explanation of THRR including how the numbers were predicted and where they are located for reference       Expected Outcomes Short Term: Able to state/look up THRR;Long Term: Able to use THRR to govern intensity when exercising independently;Short Term: Able to use daily as guideline for intensity in rehab       Able to check pulse independently Yes       Intervention Provide education and demonstration on how to check pulse in carotid and radial arteries.;Review the importance of being able to check your own  pulse for safety during independent exercise       Expected Outcomes Long Term: Able to check pulse independently and accurately;Short Term: Able to explain why pulse checking is important during independent exercise       Understanding of Exercise Prescription Yes       Intervention Provide education, explanation, and written materials on patient's individual exercise prescription       Expected Outcomes Short Term: Able to explain program exercise prescription;Long Term: Able to explain home exercise prescription to exercise independently                Exercise Goals Re-Evaluation :   Discharge Exercise Prescription (Final Exercise Prescription Changes):   Nutrition:  Target Goals: Understanding of nutrition guidelines, daily intake of sodium '1500mg'$ , cholesterol '200mg'$ , calories 30% from fat and 7% or less from saturated fats, daily to have 5 or more servings of fruits and vegetables.  Biometrics:  Pre Biometrics - 06/09/22 0906       Pre Biometrics   Waist Circumference 38 inches    Hip Circumference 41.75 inches    Waist to Hip Ratio 0.91 %    Triceps Skinfold 25 mm    % Body Fat 39.9 %    Grip Strength 23 kg    Flexibility 0 in    Single Leg Stand 30 seconds              Nutrition Therapy Plan and Nutrition Goals:   Nutrition Assessments:  MEDIFICTS Score Key: ?70 Need to make dietary changes  40-70 Heart Healthy Diet ? 40 Therapeutic Level Cholesterol Diet    Picture Your Plate Scores: [id number] Unhealthy dietary pattern with much room for improvement. 41-50 Dietary pattern unlikely to meet recommendations for good health and room for improvement. 51-60 More healthful dietary pattern, with some room for improvement.  >60 Healthy dietary pattern, although there may be some specific behaviors that could be improved.    Nutrition Goals Re-Evaluation:   Nutrition Goals Re-Evaluation:   Nutrition Goals Discharge (Final Nutrition Goals  Re-Evaluation):   Psychosocial: Target Goals: Acknowledge presence or absence of significant depression and/or stress, maximize coping skills, provide positive support system. Participant is able to verbalize types and ability to use techniques and skills needed for reducing stress and depression.  Initial Review & Psychosocial Screening:  Initial Psych Review & Screening - 06/09/22 1141       Initial Review   Current issues with None Identified      Family Dynamics   Good Support System? Yes    Comments Patient travels and does activities with friends.      Barriers   Psychosocial barriers to participate in program There are no  identifiable barriers or psychosocial needs.             Quality of Life Scores:  Quality of Life - 06/09/22 1131       Quality of Life   Select Quality of Life      Quality of Life Scores   Health/Function Pre 25.15 %    Socioeconomic Pre 30 %    Psych/Spiritual Pre 28.79 %    Family Pre 28.5 %    GLOBAL Pre 27.42 %            Scores of 19 and below usually indicate a poorer quality of life in these areas.  A difference of  2-3 points is a clinically meaningful difference.  A difference of 2-3 points in the total score of the Quality of Life Index has been associated with significant improvement in overall quality of life, self-image, physical symptoms, and general health in studies assessing change in quality of life.  PHQ-9: Review Flowsheet       06/09/2022 07/17/2021  Depression screen PHQ 2/9  Decreased Interest 0 0  Down, Depressed, Hopeless 0 1  PHQ - 2 Score 0 1  Altered sleeping 0 -  Tired, decreased energy 1 -  Change in appetite 0 -  Feeling bad or failure about yourself  0 -  Trouble concentrating 0 -  Moving slowly or fidgety/restless 0 -  Suicidal thoughts 0 -  PHQ-9 Score 1 -  Difficult doing work/chores Not difficult at all -   Interpretation of Total Score  Total Score Depression Severity:  1-4 = Minimal  depression, 5-9 = Mild depression, 10-14 = Moderate depression, 15-19 = Moderately severe depression, 20-27 = Severe depression   Psychosocial Evaluation and Intervention:   Psychosocial Re-Evaluation:   Psychosocial Discharge (Final Psychosocial Re-Evaluation):   Vocational Rehabilitation: Provide vocational rehab assistance to qualifying candidates.   Vocational Rehab Evaluation & Intervention:  Vocational Rehab - 06/09/22 1143       Initial Vocational Rehab Evaluation & Intervention   Assessment shows need for Vocational Rehabilitation No      Vocational Rehab Re-Evaulation   Comments Patient is retired.             Education: Education Goals: Education classes will be provided on a weekly basis, covering required topics. Participant will state understanding/return demonstration of topics presented.     Core Videos: Exercise    Move It!  Clinical staff conducted group or individual video education with verbal and written material and guidebook.  Patient learns the recommended Pritikin exercise program. Exercise with the goal of living a long, healthy life. Some of the health benefits of exercise include controlled diabetes, healthier blood pressure levels, improved cholesterol levels, improved heart and lung capacity, improved sleep, and better body composition. Everyone should speak with their doctor before starting or changing an exercise routine.  Biomechanical Limitations Clinical staff conducted group or individual video education with verbal and written material and guidebook.  Patient learns how biomechanical limitations can impact exercise and how we can mitigate and possibly overcome limitations to have an impactful and balanced exercise routine.  Body Composition Clinical staff conducted group or individual video education with verbal and written material and guidebook.  Patient learns that body composition (ratio of muscle mass to fat mass) is a key  component to assessing overall fitness, rather than body weight alone. Increased fat mass, especially visceral belly fat, can put [place] at increased risk for metabolic syndrome, type 2 diabetes, heart disease,  and even death. It is recommended to combine diet and exercise (cardiovascular and resistance training) to improve your body composition. Seek guidance from your physician and exercise physiologist before implementing an exercise routine.  Exercise Action Plan Clinical staff conducted group or individual video education with verbal and written material and guidebook.  Patient learns the recommended strategies to achieve and enjoy long-term exercise adherence, including variety, self-motivation, self-efficacy, and positive decision making. Benefits of exercise include fitness, good health, weight management, more energy, better sleep, less stress, and overall well-being.  Medical   Heart Disease Risk Reduction Clinical staff conducted group or individual video education with verbal and written material and guidebook.  Patient learns our heart is our most vital organ as it circulates oxygen, nutrients, white blood cells, and hormones throughout the entire body, and carries waste away. Data supports a plant-based eating plan like the Pritikin Program for its effectiveness in slowing progression of and reversing heart disease. The video provides a number of recommendations to address heart disease.   Metabolic Syndrome and Belly Fat  Clinical staff conducted group or individual video education with verbal and written material and guidebook.  Patient learns what metabolic syndrome is, how it leads to heart disease, and how one can reverse it and keep it from coming back. You have metabolic syndrome if you have 3 of the following 5 criteria: abdominal obesity, high blood pressure, high triglycerides, low HDL cholesterol, and high blood sugar.  Hypertension and Heart Disease Clinical staff conducted  group or individual video education with verbal and written material and guidebook.  Patient learns that high blood pressure, or hypertension, is very common in the [place]. Hypertension is largely due to excessive salt intake, but other important risk factors include being overweight, physical inactivity, drinking too much alcohol, smoking, and not eating enough potassium from fruits and vegetables. High blood pressure is a leading risk factor for heart attack, stroke, congestive heart failure, dementia, kidney failure, and premature death. Long-term effects of excessive salt intake include stiffening of the arteries and thickening of heart muscle and organ damage. Recommendations include ways to reduce hypertension and the risk of heart disease.  Diseases of Our Time - Focusing on Diabetes Clinical staff conducted group or individual video education with verbal and written material and guidebook.  Patient learns why the best way to stop diseases of our time is prevention, through food and other lifestyle changes. Medicine (such as prescription pills and surgeries) is often only a Band-Aid on the problem, not a long-term solution. Most common diseases of our time include obesity, type 2 diabetes, hypertension, heart disease, and cancer. The Pritikin Program is recommended and has been proven to help reduce, reverse, and/or prevent the damaging effects of metabolic syndrome.  Nutrition   Overview of the Pritikin Eating Plan  Clinical staff conducted group or individual video education with verbal and written material and guidebook.  Patient learns about the [place] for disease risk reduction. The [place] emphasizes a wide variety of unrefined, minimally-processed carbohydrates, like fruits, vegetables, whole grains, and legumes. Go, Caution, and Stop food choices are explained. Plant-based and lean animal proteins are emphasized. Rationale provided for low sodium intake for  blood pressure control, low added sugars for blood sugar stabilization, and low added fats and oils for coronary artery disease risk reduction and weight management.  Calorie Density  Clinical staff conducted group or individual video education with verbal and written material and guidebook.  Patient learns about calorie  density and how it impacts the Pritikin Eating Plan. Knowing the characteristics of the food you choose will help you decide whether those foods will lead to weight gain or weight loss, and whether you want to consume more or less of them. Weight loss is usually a side effect of the Pritikin Eating Plan because of its focus on low calorie-dense foods.  Label Reading  Clinical staff conducted group or individual video education with verbal and written material and guidebook.  Patient learns about the Pritikin recommended label reading guidelines and corresponding recommendations regarding calorie density, added sugars, sodium content, and whole grains.  Dining Out - Part 1  Clinical staff conducted group or individual video education with verbal and written material and guidebook.  Patient learns that restaurant meals can be sabotaging because they can be so high in calories, fat, sodium, and/or sugar. Patient learns recommended strategies on how to positively address this and avoid unhealthy pitfalls.  Facts on Fats  Clinical staff conducted group or individual video education with verbal and written material and guidebook.  Patient learns that lifestyle modifications can be just as effective, if not more so, as many medications for lowering your risk of heart disease. A Pritikin lifestyle can help to reduce your risk of inflammation and atherosclerosis (cholesterol build-up, or plaque, in the artery walls). Lifestyle interventions such as dietary choices and physical activity address the cause of atherosclerosis. A review of the types of fats and their impact on blood cholesterol  levels, along with dietary recommendations to reduce fat intake is also included.  Nutrition Action Plan  Clinical staff conducted group or individual video education with verbal and written material and guidebook.  Patient learns how to incorporate Pritikin recommendations into their lifestyle. Recommendations include planning and keeping personal health goals in mind as an important part of their success.  Healthy Mind-Set    Healthy Minds, Bodies, Hearts  Clinical staff conducted group or individual video education with verbal and written material and guidebook.  Patient learns how to identify when they are stressed. Video will discuss the impact of that stress, as well as the many benefits of stress management. Patient will also be introduced to stress management techniques. The way we think, act, and feel has an impact on our hearts.  How Our Thoughts Can Heal Our Hearts  Clinical staff conducted group or individual video education with verbal and written material and guidebook.  Patient learns that negative thoughts can cause depression and anxiety. This can result in negative lifestyle behavior and serious health problems. Cognitive behavioral therapy is an effective method to help control our thoughts in order to change and improve our emotional outlook.  Additional Videos:  Exercise    Improving Performance  Clinical staff conducted group or individual video education with verbal and written material and guidebook.  Patient learns to use a non-linear approach by alternating intensity levels and lengths of time spent exercising to help burn more calories and lose more body fat. Cardiovascular exercise helps improve heart health, metabolism, hormonal balance, blood sugar control, and recovery from fatigue. Resistance training improves strength, endurance, balance, coordination, reaction time, metabolism, and muscle mass. Flexibility exercise improves circulation, posture, and balance. Seek  guidance from your physician and exercise physiologist before implementing an exercise routine and learn your capabilities and proper form for all exercise.  Introduction to Yoga  Clinical staff conducted group or individual video education with verbal and written material and guidebook.  Patient learns about yoga, a discipline of  the coming together of mind, breath, and body. The benefits of yoga include improved flexibility, improved range of motion, better posture and core strength, increased lung function, weight loss, and positive self-image. Yoga's heart health benefits include lowered blood pressure, healthier heart rate, decreased cholesterol and triglyceride levels, improved immune function, and reduced stress. Seek guidance from your physician and exercise physiologist before implementing an exercise routine and learn your capabilities and proper form for all exercise.  Medical   Aging: Enhancing Your Quality of Life  Clinical staff conducted group or individual video education with verbal and written material and guidebook.  Patient learns key strategies and recommendations to stay in good physical health and enhance quality of life, such as prevention strategies, having an advocate, securing a [place], and keeping a list of medications and system for tracking them. It also discusses how to avoid risk for bone loss.  Biology of Weight Control  Clinical staff conducted group or individual video education with verbal and written material and guidebook.  Patient learns that weight gain occurs because we consume more calories than we burn (eating more, moving less). Even if your body weight is normal, you may have higher ratios of fat compared to muscle mass. Too much body fat puts you at increased risk for cardiovascular disease, heart attack, stroke, type 2 diabetes, and obesity-related cancers. In addition to exercise, following the [place] can help  reduce your risk.  Decoding Lab Results  Clinical staff conducted group or individual video education with verbal and written material and guidebook.  Patient learns that lab test reflects one measurement whose values change over time and are influenced by many factors, including medication, stress, sleep, exercise, food, hydration, pre-existing medical conditions, and more. It is recommended to use the knowledge from this video to become more involved with your lab results and evaluate your numbers to speak with your doctor.   Diseases of Our Time - Overview  Clinical staff conducted group or individual video education with verbal and written material and guidebook.  Patient learns that according to the CDC, 50% to 70% of chronic diseases (such as obesity, type 2 diabetes, elevated lipids, hypertension, and heart disease) are avoidable through lifestyle improvements including healthier food choices, listening to satiety cues, and increased physical activity.  Sleep Disorders Clinical staff conducted group or individual video education with verbal and written material and guidebook.  Patient learns how good quality and duration of sleep are important to overall health and well-being. Patient also learns about sleep disorders and how they impact health along with recommendations to address them, including discussing with a physician.  Nutrition  Dining Out - Part 2 Clinical staff conducted group or individual video education with verbal and written material and guidebook.  Patient learns how to plan ahead and communicate in order to maximize their dining experience in a healthy and nutritious manner. Included are recommended food choices based on the type of restaurant the patient is visiting.   Fueling a Best boy conducted group or individual video education with verbal and written material and guidebook.  There is a strong connection between our food choices and our health.  Diseases like obesity and type 2 diabetes are very prevalent and are in large-part due to lifestyle choices. The Pritikin Eating Plan provides plenty of food and hunger-curbing satisfaction. It is easy to follow, affordable, and helps reduce health risks.  Menu Workshop  Clinical staff conducted group or  individual video education with verbal and written material and guidebook.  Patient learns that restaurant meals can sabotage health goals because they are often packed with calories, fat, sodium, and sugar. Recommendations include strategies to plan ahead and to communicate with the manager, chef, or server to help order a healthier meal.  Planning Your Eating Strategy  Clinical staff conducted group or individual video education with verbal and written material and guidebook.  Patient learns about the [place] and its benefit of reducing the risk of disease. The [place] does not focus on calories. Instead, it emphasizes high-quality, nutrient-rich foods. By knowing the characteristics of the foods, we choose, we can determine their calorie density and make informed decisions.  Targeting Your Nutrition Priorities  Clinical staff conducted group or individual video education with verbal and written material and guidebook.  Patient learns that lifestyle habits have a tremendous impact on disease risk and progression. This video provides eating and physical activity recommendations based on your personal health goals, such as reducing LDL cholesterol, losing weight, preventing or controlling type 2 diabetes, and reducing high blood pressure.  Vitamins and Minerals  Clinical staff conducted group or individual video education with verbal and written material and guidebook.  Patient learns different ways to obtain key vitamins and minerals, including through a recommended healthy diet. It is important to discuss all supplements you take with your doctor.   Healthy Mind-Set     Smoking Cessation  Clinical staff conducted group or individual video education with verbal and written material and guidebook.  Patient learns that cigarette smoking and tobacco addiction pose a serious health risk which affects millions of people. Stopping smoking will significantly reduce the risk of heart disease, lung disease, and many forms of cancer. Recommended strategies for quitting are covered, including working with your doctor to develop a successful plan.  Culinary   Becoming a Financial trader conducted group or individual video education with verbal and written material and guidebook.  Patient learns that cooking at home can be healthy, cost-effective, quick, and puts them in control. Keys to cooking healthy recipes will include looking at your recipe, assessing your equipment needs, planning ahead, making it simple, choosing cost-effective seasonal ingredients, and limiting the use of added fats, salts, and sugars.  Cooking - Breakfast and Snacks  Clinical staff conducted group or individual video education with verbal and written material and guidebook.  Patient learns how important breakfast is to satiety and nutrition through the entire day. Recommendations include key foods to eat during breakfast to help stabilize blood sugar levels and to prevent overeating at meals later in the day. Planning ahead is also a key component.  Cooking - Human resources officer conducted group or individual video education with verbal and written material and guidebook.  Patient learns eating strategies to improve overall health, including an approach to cook more at home. Recommendations include thinking of animal protein as a side on your plate rather than center stage and focusing instead on lower calorie dense options like vegetables, fruits, whole grains, and plant-based proteins, such as beans. Making sauces in large quantities to freeze for later and leaving the skin  on your vegetables are also recommended to maximize your experience.  Cooking - Healthy Salads and Dressing Clinical staff conducted group or individual video education with verbal and written material and guidebook.  Patient learns that vegetables, fruits, whole grains, and legumes are the foundations of the [place].  Recommendations include how to incorporate each of these in flavorful and healthy salads, and how to create homemade salad dressings. Proper handling of ingredients is also covered. Cooking - Soups and [employer] - Soups and Desserts Clinical staff conducted group or individual video education with verbal and written material and guidebook.  Patient learns that Pritikin soups and desserts make for easy, nutritious, and delicious snacks and meal components that are low in sodium, fat, sugar, and calorie density, while high in vitamins, minerals, and filling fiber. Recommendations include simple and healthy ideas for soups and desserts.   Overview     The Pritikin Solution Program Overview Clinical staff conducted group or individual video education with verbal and written material and guidebook.  Patient learns that the results of the [place] Program have been documented in more than 100 articles published in peer-reviewed journals, and the benefits include reducing risk factors for (and, in some cases, even reversing) high cholesterol, high blood pressure, type 2 diabetes, obesity, and more! An overview of the three key pillars of the Pritikin Program will be covered: eating well, doing regular exercise, and having a healthy mind-set.  WORKSHOPS  Exercise: Exercise Basics: Building Your Action Plan Clinical staff led group instruction and group discussion with PowerPoint presentation and patient guidebook. To enhance the learning environment the use of posters, models and videos may be added. At the conclusion of this workshop, patients will comprehend the  difference between physical activity and exercise, as well as the benefits of incorporating both, into their routine. Patients will understand the FITT (Frequency, Intensity, Time, and Type) principle and how to use it to build an exercise action plan. In addition, safety concerns and other considerations for exercise and cardiac rehab will be addressed by the presenter. The purpose of this lesson is to promote a comprehensive and effective weekly exercise routine in order to improve patients' overall level of fitness.   Managing Heart Disease: Your Path to a Healthier Heart Clinical staff led group instruction and group discussion with PowerPoint presentation and patient guidebook. To enhance the learning environment the use of posters, models and videos may be added.At the conclusion of this workshop, patients will understand the anatomy and physiology of the heart. Additionally, they will understand how Pritikin's three pillars impact the risk factors, the progression, and the management of heart disease.  The purpose of this lesson is to provide a high-level overview of the heart, heart disease, and how the Pritikin lifestyle positively impacts risk factors.  Exercise Biomechanics Clinical staff led group instruction and group discussion with PowerPoint presentation and patient guidebook. To enhance the learning environment the use of posters, models and videos may be added. Patients will learn how the structural parts of their bodies function and how these functions impact their daily activities, movement, and exercise. Patients will learn how to promote a neutral spine, learn how to manage pain, and identify ways to improve their physical movement in order to promote healthy living. The purpose of this lesson is to expose patients to common physical limitations that impact physical activity. Participants will learn practical ways to adapt and manage aches and pains, and to minimize their  effect on regular exercise. Patients will learn how to maintain good posture while sitting, walking, and lifting.  Balance Training and Fall Prevention  Clinical staff led group instruction and group discussion with PowerPoint presentation and patient guidebook. To enhance the learning environment the use of posters, models and videos may be added. At the  conclusion of this workshop, patients will understand the importance of their sensorimotor skills (vision, proprioception, and the vestibular system) in maintaining their ability to balance as they age. Patients will apply a variety of balancing exercises that are appropriate for their current level of function. Patients will understand the common causes for poor balance, possible solutions to these problems, and ways to modify their physical environment in order to minimize their fall risk. The purpose of this lesson is to teach patients about the importance of maintaining balance as they age and ways to minimize their risk of falling.  WORKSHOPS   Nutrition:  Fueling a Scientist, research (physical sciences) led group instruction and group discussion with PowerPoint presentation and patient guidebook. To enhance the learning environment the use of posters, models and videos may be added. Patients will review the foundational principles of the [place] and understand what constitutes a serving size in each of the food groups. Patients will also learn Pritikin-friendly foods that are better choices when away from home and review make-ahead meal and snack options. Calorie density will be reviewed and applied to three nutrition priorities: weight maintenance, weight loss, and weight gain. The purpose of this lesson is to reinforce (in a group setting) the key concepts around what patients are recommended to eat and how to apply these guidelines when away from home by planning and selecting Pritikin-friendly options. Patients will understand how calorie  density may be adjusted for different weight management goals.  Mindful Eating  Clinical staff led group instruction and group discussion with PowerPoint presentation and patient guidebook. To enhance the learning environment the use of posters, models and videos may be added. Patients will briefly review the concepts of the [place] and the importance of low-calorie dense foods. The concept of mindful eating will be introduced as well as the importance of paying attention to internal hunger signals. Triggers for non-hunger eating and techniques for dealing with triggers will be explored. The purpose of this lesson is to provide patients with the opportunity to review the basic principles of the [place], discuss the value of eating mindfully and how to measure internal cues of hunger and fullness using the Hunger Scale. Patients will also discuss reasons for non-hunger eating and learn strategies to use for controlling emotional eating.  Targeting Your Nutrition Priorities Clinical staff led group instruction and group discussion with PowerPoint presentation and patient guidebook. To enhance the learning environment the use of posters, models and videos may be added. Patients will learn how to determine their genetic susceptibility to disease by reviewing their family history. Patients will gain insight into the importance of diet as part of an overall healthy lifestyle in mitigating the impact of genetics and other environmental insults. The purpose of this lesson is to provide patients with the opportunity to assess their personal nutrition priorities by looking at their family history, their own health history and current risk factors. Patients will also be able to discuss ways of prioritizing and modifying the [place] for their highest risk areas  Menu  Clinical staff led group instruction and group discussion with PowerPoint presentation and patient guidebook. To  enhance the learning environment the use of posters, models and videos may be added. Using menus brought in from [employer], or printed from [employer], patients will apply the [place] dining out guidelines that were presented in the [employer] video. Patients will also be able to practice these guidelines in a  variety of provided scenarios. The purpose of this lesson is to provide patients with the opportunity to practice hands-on learning of the [place] with actual menus and practice scenarios.  Label Reading Clinical staff led group instruction and group discussion with PowerPoint presentation and patient guidebook. To enhance the learning environment the use of posters, models and videos may be added. Patients will review and discuss the Pritikin label reading guidelines presented in Pritikin's Label Reading Educational series video. Using fool labels brought in from local grocery stores and markets, patients will apply the label reading guidelines and determine if the packaged food meet the Pritikin guidelines. The purpose of this lesson is to provide patients with the opportunity to review, discuss, and practice hands-on learning of the Pritikin Label Reading guidelines with actual packaged food labels. [place] Workshops are designed to teach patients ways to prepare quick, simple, and affordable recipes at home. The importance of nutrition's role in chronic disease risk reduction is reflected in its emphasis in the overall Pritikin program. By learning how to prepare essential core Pritikin Eating Plan recipes, patients will increase control over what they eat; be able to customize the flavor of foods without the use of added salt, sugar, or fat; and improve the quality of the food they consume. By learning a set of core recipes which are easily assembled, quickly prepared, and affordable, patients are more likely to  prepare more healthy foods at home. These workshops focus on convenient breakfasts, simple entres, side dishes, and desserts which can be prepared with minimal effort and are consistent with nutrition recommendations for cardiovascular risk reduction. Cooking [employer] are taught by a Engineer, materials (RD) who has been trained by the [employer]. The chef or RD has a clear understanding of the importance of minimizing - if not completely eliminating - added fat, sugar, and sodium in recipes. Throughout the series of [place] Workshop sessions, patients will learn about healthy ingredients and efficient methods of cooking to build confidence in their capability to prepare    Cooking School weekly topics:  Adding Flavor- Sodium-Free  Fast and Healthy Breakfasts  Powerhouse Plant-Based Proteins  Satisfying Salads and Dressings  Simple Sides and Sauces  International Cuisine-Spotlight on the [place] Zones  Delicious Desserts  Savory Soups  Efficiency Cooking - Meals in a Snap  Tasty Appetizers and Snacks  Comforting Weekend Breakfasts  One-Pot Wonders   Fast Evening Meals  Easy [place] (Psychosocial): New Thoughts, New Behaviors Clinical staff led group instruction and group discussion with PowerPoint presentation and patient guidebook. To enhance the learning environment the use of posters, models and videos may be added. Patients will learn and practice techniques for developing effective health and lifestyle goals. Patients will be able to effectively apply the goal setting process learned to develop at least one new personal goal.  The purpose of this lesson is to expose patients to a new skill set of behavior modification techniques such as techniques setting SMART goals, overcoming barriers, and achieving new thoughts and new behaviors.  Managing Moods and Relationships Clinical  staff led group instruction and group discussion with PowerPoint presentation and patient guidebook. To enhance the learning environment the use of posters, models and videos may be added. Patients will learn how emotional and chronic stress factors can impact their health and relationships. They will learn healthy ways to manage their moods  and utilize positive coping mechanisms. In addition, ICR patients will learn ways to improve communication skills. The purpose of this lesson is to expose patients to ways of understanding how one's mood and health are intimately connected. Developing a healthy outlook can help build positive relationships and connections with others. Patients will understand the importance of utilizing effective communication skills that include actively listening and being heard. They will learn and understand the importance of the "4 Cs" and especially Connections in fostering of a Healthy Mind-Set.  Healthy Sleep for a Healthy Heart Clinical staff led group instruction and group discussion with PowerPoint presentation and patient guidebook. To enhance the learning environment the use of posters, models and videos may be added. At the conclusion of this workshop, patients will be able to demonstrate knowledge of the importance of sleep to overall health, well-being, and quality of life. They will understand the symptoms of, and treatments for, common sleep disorders. Patients will also be able to identify daytime and nighttime behaviors which impact sleep, and they will be able to apply these tools to help manage sleep-related challenges. The purpose of this lesson is to provide patients with a general overview of sleep and outline the importance of quality sleep. Patients will learn about a few of the most common sleep disorders. Patients will also be introduced to the concept of "sleep hygiene," and discover ways to self-manage certain sleeping problems through simple daily behavior  changes. Finally, the workshop will motivate patients by clarifying the links between quality sleep and their goals of heart-healthy living.   Recognizing and Reducing Stress Clinical staff led group instruction and group discussion with PowerPoint presentation and patient guidebook. To enhance the learning environment the use of posters, models and videos may be added. At the conclusion of this workshop, patients will be able to understand the types of stress reactions, differentiate between acute and chronic stress, and recognize the impact that chronic stress has on their health. They will also be able to apply different coping mechanisms, such as reframing negative self-talk. Patients will have the opportunity to practice a variety of stress management techniques, such as deep abdominal breathing, progressive muscle relaxation, and/or guided imagery.  The purpose of this lesson is to educate patients on the role of stress in their lives and to provide healthy techniques for coping with it.  Learning Barriers/Preferences:  Learning Barriers/Preferences - 06/09/22 1142       Learning Barriers/Preferences   Learning Barriers None    Learning Preferences Written Material             Education Topics:  Knowledge Questionnaire Score:  Knowledge Questionnaire Score - 06/09/22 1132       Knowledge Questionnaire Score   Pre Score 16/24             Core Components/Risk Factors/Patient Goals at Admission:  Personal Goals and Risk Factors at Admission - 06/09/22 0924       Core Components/Risk Factors/Patient Goals on Admission    Weight Management Yes;Weight Loss    Intervention Weight Management: Develop a combined nutrition and exercise program designed to reach desired caloric intake, while maintaining appropriate intake of nutrient and fiber, sodium and fats, and appropriate energy expenditure required for the weight goal.;Weight Management: Provide education and appropriate  resources to help participant work on and attain dietary goals.    Admit Weight 168 lb 6.4 oz (76.4 kg)    Goal Weight: Short Term 163 lb 6.4 oz (74.1 kg)  Goal Weight: Long Term 158 lb 6.4 oz (71.8 kg)    Expected Outcomes Short Term: Continue to assess and modify interventions until short term weight is achieved;Long Term: Adherence to nutrition and physical activity/exercise program aimed toward attainment of established weight goal;Weight Loss: Understanding of general recommendations for a balanced deficit meal plan, which promotes 1-2 lb weight loss per week and includes a negative energy balance of [phone] kcal/d    Improve shortness of breath with ADL's Yes    Intervention Provide education, individualized exercise plan and daily activity instruction to help decrease symptoms of SOB with activities of daily living.    Expected Outcomes Short Term: Improve cardiorespiratory fitness to achieve a reduction of symptoms when performing ADLs;Long Term: Be able to perform more ADLs without symptoms or delay the onset of symptoms    Hypertension Yes    Intervention Provide education on lifestyle modifcations including regular physical activity/exercise, weight management, moderate sodium restriction and increased consumption of fresh fruit, vegetables, and low fat dairy, alcohol moderation, and smoking cessation.;Monitor prescription use compliance.    Expected Outcomes Short Term: Continued assessment and intervention until BP is < 140/59m HG in hypertensive participants. < 130/833mHG in hypertensive participants with diabetes, heart failure or chronic kidney disease.;Long Term: Maintenance of blood pressure at goal levels.    Lipids Yes    Intervention Provide education and support for participant on nutrition & aerobic/resistive exercise along with prescribed medications to achieve LDL '70mg'$ , HDL >'40mg'$ .    Expected Outcomes Short Term: Participant states understanding of desired cholesterol  values and is compliant with medications prescribed. Participant is following exercise prescription and nutrition guidelines.;Long Term: Cholesterol controlled with medications as prescribed, with individualized exercise RX and with personalized nutrition plan. Value goals: LDL < '70mg'$ , HDL > 40 mg.    Personal Goal Other Yes    Personal Goal Be more aware of what she eats to help with weight loss. Know parameters for exercise.    Intervention Provide education and support for participant on nutrition and aerobic/resistive exercise to help achieve weight loss goals. Provide individualized exercise prescription with parameters that patient can follow.    Expected Outcomes Patient will be compliant with exercise and nutrition plan.             Core Components/Risk Factors/Patient Goals Review:    Core Components/Risk Factors/Patient Goals at Discharge (Final Review):    ITP Comments:  ITP Comments     Row Name 06/09/22 0906           ITP Comments Medical Director- Dr. TrFransico HimMD. Introduction to the [facility]/Intensive Cardiac Rehab. Reviewed initial orientation packet with patient.                Comments: Participant attended orientation for the cardiac rehabilitation program on  06/09/2022  to perform initial intake and exercise walk test. [place] [facility] and orientation packet was reviewed. Completed 6-minute walk test, measurements, initial ITP, and exercise prescription. Blood pressure elevated at rest and during six-minute walk test. Telemetry-normal sinus rhythm, with occasional PVCs/trigeminy during walk test, mild shortness of breath, which resolved with rest.   Service time was from 906 to 1102.

## 2022-06-15 ENCOUNTER — Encounter (HOSPITAL_COMMUNITY): Payer: Medicare Other

## 2022-06-15 ENCOUNTER — Encounter (HOSPITAL_COMMUNITY)
Admission: RE | Admit: 2022-06-15 | Discharge: 2022-06-15 | Disposition: A | Discharge status: Home / Self Care | Payer: Medicare Other | Source: Ambulatory Visit | Attending: Internal Medicine | Admitting: Internal Medicine

## 2022-06-15 DIAGNOSIS — Z952 Presence of prosthetic heart valve: Secondary | ICD-10-CM

## 2022-06-15 NOTE — Progress Notes (Signed)
Daily Session Note  Patient Details  Name: Marissa Powers MRN: [id number] Date of Birth: 1940/09/20 Referring Provider:   Flowsheet Row INTENSIVE CARDIAC REHAB ORIENT from 06/09/2022 in [facility], [facility], & [place]  Referring Provider Jolaine Artist, MD       Encounter Date: 06/15/2022  Check In:  Session Check In - 06/15/22 0701       Check-In   Supervising physician immediately available to respond to emergencies [facility] MD immediately available    Physician(s) Coletta Memos, PA    Location [facility]    Staff Present Seward Carol, MS, ACSM-CEP, Exercise Physiologist;Jetta Ceasar Mons, ACSM-CEP, Exercise Physiologist;Kaylee Rosana Hoes, MS, ACSM-CEP, Exercise Physiologist;Rebecca Araceli Bouche, RN, BSN    Virtual Visit No    Medication changes reported     No    Fall or balance concerns reported    No    Warm-up and Cool-down Performed as group-led instruction    Resistance Training Performed Yes    VAD Patient? No    PAD/SET Patient? No      Pain Assessment   Currently in Pain? No/denies    Pain Score 0-No pain    Multiple Pain Sites No             Capillary Blood Glucose: No results found for this or any previous visit (from the past 24 hour(s)).   Exercise Prescription Changes - 06/15/22 0948       Response to Exercise   Blood Pressure (Admit) 122/68    Blood Pressure (Exercise) 152/82    Blood Pressure (Exit) 114/64    Heart Rate (Admit) 66 bpm    Heart Rate (Exercise) 90 bpm    Heart Rate (Exit) 73 bpm    Rating of Perceived Exertion (Exercise) 12.5    Perceived Dyspnea (Exercise) 0    Symptoms 0    Comments Pt first day in the [facility] program    Duration Progress to 30 minutes of  aerobic without signs/symptoms of physical distress    Intensity THRR unchanged      Progression   Progression Continue to progress workloads to maintain intensity without signs/symptoms of physical distress.     Average METs 2.7      Resistance Training   Training Prescription Yes    Weight 2 lbs    Reps 10-15    Time 10 Minutes      NuStep   Level 2    SPM 77    Minutes 15    METs 2.3      Recumbant Elliptical   Level 1    Minutes 15    METs 3.1             Social History   Tobacco Use  Smoking Status Never   Passive exposure: Never  Smokeless Tobacco Never    Goals Met:  Exercise tolerated well No report of concerns or symptoms today Strength training completed today  Goals Unmet:  Not Applicable  Comments: Pt started cardiac rehab today.  Pt tolerated light exercise without difficulty. VSS, telemetry-Sinus Rhythm, asymptomatic.  Medication list reconciled. Pt denies barriers to medicaiton compliance.  PSYCHOSOCIAL ASSESSMENT:  PHQ-1. Pt exhibits positive coping skills, hopeful outlook with supportive family. No psychosocial needs identified at this time, no psychosocial interventions necessary.    Pt enjoys playing bridge, travelling and walking.   Pt oriented to exercise equipment and routine.    Understanding verbalized.Harrell Gave RN BSN  Dr. Traci Turner is Medical Director for Cardiac Rehab at [facility]. 

## 2022-06-17 ENCOUNTER — Encounter (HOSPITAL_COMMUNITY): Payer: Medicare Other

## 2022-06-17 ENCOUNTER — Encounter (HOSPITAL_COMMUNITY)
Admission: RE | Admit: 2022-06-17 | Discharge: 2022-06-17 | Disposition: A | Discharge status: Home / Self Care | Payer: Medicare Other | Source: Ambulatory Visit | Attending: Internal Medicine | Admitting: Internal Medicine

## 2022-06-17 DIAGNOSIS — Z952 Presence of prosthetic heart valve: Secondary | ICD-10-CM | POA: Diagnosis not present

## 2022-06-19 ENCOUNTER — Encounter (HOSPITAL_COMMUNITY): Payer: Medicare Other

## 2022-06-19 ENCOUNTER — Encounter (HOSPITAL_COMMUNITY)
Admission: RE | Admit: 2022-06-19 | Discharge: 2022-06-19 | Disposition: A | Discharge status: Home / Self Care | Payer: Medicare Other | Source: Ambulatory Visit | Attending: Internal Medicine | Admitting: Internal Medicine

## 2022-06-19 DIAGNOSIS — Z952 Presence of prosthetic heart valve: Secondary | ICD-10-CM | POA: Diagnosis not present

## 2022-06-22 ENCOUNTER — Encounter (HOSPITAL_COMMUNITY)
Admission: RE | Admit: 2022-06-22 | Discharge: 2022-06-22 | Disposition: A | Discharge status: Home / Self Care | Payer: Medicare Other | Source: Ambulatory Visit | Attending: Internal Medicine | Admitting: Internal Medicine

## 2022-06-22 ENCOUNTER — Encounter (HOSPITAL_COMMUNITY): Payer: Medicare Other

## 2022-06-22 DIAGNOSIS — Z952 Presence of prosthetic heart valve: Secondary | ICD-10-CM | POA: Diagnosis not present

## 2022-06-23 ENCOUNTER — Other Ambulatory Visit: Payer: Self-pay

## 2022-06-23 ENCOUNTER — Encounter: Payer: Self-pay | Admitting: Adult Health

## 2022-06-23 ENCOUNTER — Inpatient Hospital Stay: Payer: Medicare Other | Attending: Radiation Oncology | Admitting: Adult Health

## 2022-06-23 VITALS — BP 137/74 | HR 65 | Temp 97.8°F | Resp 18 | Ht 66.0 in | Wt 165.1 lb

## 2022-06-23 DIAGNOSIS — Z7951 Long term (current) use of inhaled steroids: Secondary | ICD-10-CM | POA: Insufficient documentation

## 2022-06-23 DIAGNOSIS — K589 Irritable bowel syndrome without diarrhea: Secondary | ICD-10-CM | POA: Insufficient documentation

## 2022-06-23 DIAGNOSIS — C50412 Malignant neoplasm of upper-outer quadrant of left female breast: Secondary | ICD-10-CM | POA: Insufficient documentation

## 2022-06-23 DIAGNOSIS — Z7901 Long term (current) use of anticoagulants: Secondary | ICD-10-CM | POA: Insufficient documentation

## 2022-06-23 DIAGNOSIS — Z79811 Long term (current) use of aromatase inhibitors: Secondary | ICD-10-CM | POA: Insufficient documentation

## 2022-06-23 DIAGNOSIS — I10 Essential (primary) hypertension: Secondary | ICD-10-CM | POA: Insufficient documentation

## 2022-06-23 DIAGNOSIS — E7800 Pure hypercholesterolemia, unspecified: Secondary | ICD-10-CM | POA: Diagnosis not present

## 2022-06-23 DIAGNOSIS — Z170 Estrogen receptor positive status [ER+]: Secondary | ICD-10-CM | POA: Diagnosis not present

## 2022-06-23 DIAGNOSIS — M316 Other giant cell arteritis: Secondary | ICD-10-CM | POA: Insufficient documentation

## 2022-06-23 DIAGNOSIS — I480 Paroxysmal atrial fibrillation: Secondary | ICD-10-CM | POA: Diagnosis not present

## 2022-06-23 DIAGNOSIS — C50512 Malignant neoplasm of lower-outer quadrant of left female breast: Secondary | ICD-10-CM | POA: Diagnosis not present

## 2022-06-23 DIAGNOSIS — Z79899 Other long term (current) drug therapy: Secondary | ICD-10-CM | POA: Diagnosis not present

## 2022-06-23 DIAGNOSIS — E039 Hypothyroidism, unspecified: Secondary | ICD-10-CM | POA: Insufficient documentation

## 2022-06-23 DIAGNOSIS — I2510 Atherosclerotic heart disease of native coronary artery without angina pectoris: Secondary | ICD-10-CM | POA: Insufficient documentation

## 2022-06-23 DIAGNOSIS — I672 Cerebral atherosclerosis: Secondary | ICD-10-CM | POA: Insufficient documentation

## 2022-06-23 DIAGNOSIS — I471 Supraventricular tachycardia, unspecified: Secondary | ICD-10-CM | POA: Insufficient documentation

## 2022-06-23 DIAGNOSIS — Z923 Personal history of irradiation: Secondary | ICD-10-CM | POA: Diagnosis not present

## 2022-06-23 DIAGNOSIS — Z7984 Long term (current) use of oral hypoglycemic drugs: Secondary | ICD-10-CM | POA: Diagnosis not present

## 2022-06-23 NOTE — Progress Notes (Signed)
SURVIVORSHIP VISIT:   BRIEF ONCOLOGIC HISTORY:  Oncology History  Malignant neoplasm of lower-outer quadrant of left breast of female, estrogen receptor positive ([place])  07/18/2021 Initial Diagnosis   Left breast global asymmetry/density measuring 8 x 4.4 x 4.7 cm.  Ultrasound showed 2 focal masses.  3.1 cm: Biopsy grade 2 ILC ER 90%, PR 10%, HER2 equivocal, FISH negative, Ki-67 10%; lymph node biopsy: Positive Breast MRI abnormality left breast measured 8 x 4.2 x 4.1 cm    Genetic Testing   [facility] CancerNext Panel was Negative. 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 Report date is 09/11/2021.  The CancerNext gene panel offered by [employer] includes sequencing, rearrangement analysis, and RNA analysis for the following 36 genes:   APC, ATM, AXIN2, BARD1, BMPR1A, BRCA1, BRCA2, BRIP1, CDH1, CDK4, CDKN2A, CHEK2, DICER1, HOXB13, EPCAM, GREM1, MLH1, MSH2, MSH3, MSH6, MUTYH, NBN, NF1, NTHL1, PALB2, PMS2, POLD1, POLE, PTEN, RAD51C, RAD51D, RECQL, SMAD4, SMARCA4, STK11, and TP53.    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, look inflamed for vascular invasion present, ER 90%, PR 10%, HER2 negative, Ki-67 10%   01/07/2022 Cancer Staging   Staging form: Breast, AJCC 8th Edition - Pathologic: Stage IB (pT3, pN1, cM0, G2, ER+, PR+, HER2-) - Signed by Nicholas Lose, MD on 01/07/2022 Stage prefix: Initial diagnosis Histologic grading system: 3 grade system   02/05/2022 - 03/25/2022 Radiation Therapy   Site Technique Total Dose (Gy) Dose per Fx (Gy) Completed Fx Beam Energies  Breast, Left: Breast_L 3D 50.4/50.4 1.8 28/28 10XFFF  Breast, Left: Breast_L_SCV_PAB 3D  50.4/50.4 1.8 28/28 6X, 10X  Breast, Left: Breast_L_Bst 3D 10/10 2 5/5 6X, 10X       INTERVAL HISTORY:  Marissa Powers to review her survivorship care plan detailing her treatment course for breast cancer, as well as monitoring long-term side effects of that treatment, education regarding health maintenance, screening, and overall wellness and health promotion.     Overall, Marissa Powers reports feeling quite well.  She is taking Letrozole daily with good tolerance.  She denies any significant side effects from the Letrozole.  She is going to cardiac rehab daily and is doing well in this.  She is also participating in strength training once per week.   REVIEW OF SYSTEMS:  Review of Systems  Constitutional:  Negative for appetite change, chills, fatigue, fever and unexpected weight change.  HENT:   Negative for hearing loss, lump/mass and trouble swallowing.   Eyes:  Negative for eye problems and icterus.  Respiratory:  Negative for chest tightness, cough and shortness of breath.   Cardiovascular:  Negative for chest pain, leg swelling and palpitations.  Gastrointestinal:  Negative for abdominal distention, abdominal pain, constipation, diarrhea, nausea and vomiting.  Endocrine: Negative for hot flashes.  Genitourinary:  Negative for difficulty urinating.   Musculoskeletal:  Negative for arthralgias.  Skin:  Negative for itching and rash.  Neurological:  Negative for dizziness, extremity weakness, headaches and numbness.  Hematological:  Negative for adenopathy. Does not bruise/bleed easily.  Psychiatric/Behavioral:  Negative for depression. The patient is not nervous/anxious.    Breast: Denies any new nodularity, masses, tenderness, nipple changes, or nipple discharge.   PAST MEDICAL/SURGICAL HISTORY:  Past Medical History:  Diagnosis Date  Arthritis    osteoarthritis   Breast cancer ([place]) 11/2021   CAD (coronary artery disease)    Descending thoracic aortic dissection (HCC)    followed by  Dr. 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 Angelena Form & Dr. Cyndia Bent .   Severe aortic stenosis    Past Surgical History:  Procedure Laterality Date   AXILLARY SENTINEL NODE BIOPSY Left 12/29/2021   Procedure: LEFT AXILLARY SENTINEL NODE BIOPSY;  Surgeon: Rolm Bookbinder, MD;  Location: [facility];  Service: General;  Laterality: Left;   BREAST BIOPSY Bilateral 1970's   x2 one a side   BREAST LUMPECTOMY WITH RADIOACTIVE SEED LOCALIZATION Left 12/29/2021   Procedure: BREAST LUMPECTOMY WITH RADIOACTIVE SEED LOCALIZATION X2;  Surgeon: Rolm Bookbinder, MD;  Location: [facility];  Service: General;  Laterality: Left;   CATARACT EXTRACTION     bilateral   implantable loop recorder removal  05/30/2021   MDT reveal LINQ removed by Dr Rayann Heman   INTRAOPERATIVE TRANSTHORACIC ECHOCARDIOGRAM N/A 08/19/2021   Procedure: INTRAOPERATIVE TRANSTHORACIC ECHOCARDIOGRAM;  Surgeon: Burnell Blanks, MD;  Location: [facility];  Service: Open Heart Surgery;  Laterality: N/A;   LOOP RECORDER INSERTION N/A 08/11/2017   Procedure: LOOP RECORDER INSERTION;  Surgeon: Thompson Grayer, MD;  Location: [facility];  Service: Cardiovascular;  Laterality: N/A;   RADIOACTIVE SEED GUIDED AXILLARY SENTINEL LYMPH NODE Left 12/29/2021   Procedure: LEFT AXILLARY NODE SEED GUIDED EXCISION;  Surgeon: Rolm Bookbinder, MD;  Location: [facility];  Service: General;  Laterality: Left;   RETINAL DETACHMENT SURGERY  2000   RIGHT/LEFT HEART CATH AND CORONARY  ANGIOGRAPHY N/A 06/19/2021   Procedure: RIGHT/LEFT HEART CATH AND CORONARY ANGIOGRAPHY;  Surgeon: Belva Crome, MD;  Location: [facility];  Service: Cardiovascular;  Laterality: N/A;   THYROIDECTOMY  1967   TONSILLECTOMY     TOTAL KNEE ARTHROPLASTY  04/25/2012   Procedure: TOTAL KNEE BILATERAL;  Surgeon: Gearlean Alf, MD;  Location: [facility];  Service: Orthopedics;  Laterality: Bilateral;   TRANSCATHETER AORTIC VALVE REPLACEMENT, TRANSFEMORAL N/A 08/19/2021   Procedure: Transcatheter Aortic Valve Replacement, Transfemoral Using 31m Sapien 3 Ultra Edwards Valve;  Surgeon: MBurnell Blanks MD;  Location: [facility]  Service: Open Heart Surgery;  Laterality: N/A;     ALLERGIES:  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    amiodarone (PACERONE) 200 MG tablet Take 1 tablet (200 mg total) by mouth 3 (three) times a week. Take on Mondays, Wednesdays, and Fridays.    apixaban (ELIQUIS) 5 MG TABS tablet Take  1 tablet (5 mg total) by mouth 2 (two) times daily. Resume Apixaban at 8 PM 06/19/2921    b complex vitamins capsule Take 1 capsule by mouth daily.    Calcium Carb-Cholecalciferol (CALCIUM + D3 PO) Take 1 tablet by mouth in the morning. 12/22/2021: Citracal   cholecalciferol (VITAMIN D3) 25 MCG (1000 UNIT) tablet Take 1,000 Units by mouth in the morning.    dapagliflozin propanediol (FARXIGA) 10 MG TABS tablet Take 1 tablet (10 mg total) by mouth daily before breakfast.    denosumab (PROLIA) 60 MG/ML SOSY injection Inject 60 mg into the skin every 6 (six) months.    dicyclomine (BENTYL) 10 MG capsule Take 10-20 mg by mouth 3 (three) times  daily as needed for spasms.    folic acid (FOLVITE) 1 MG tablet Take 2 mg by mouth in the morning.    letrozole (FEMARA) 2.5 MG tablet TAKE 1 TABLET(2.5 MG) BY MOUTH DAILY    levothyroxine (SYNTHROID) 100 MCG tablet Take 100 mcg by mouth daily before breakfast.    loperamide (IMODIUM) 2 MG capsule Take 2-4 mg by mouth 4 (four) times daily as needed (upset stomach/diarrhea/loose stools).    metoprolol succinate (TOPROL XL) 25 MG 24 hr tablet Take 1 tablet (25 mg total) by mouth daily.    mometasone (ELOCON) 0.1 % ointment Apply topically to vulvar skin no more than twice weekly    nitroGLYCERIN (NITROSTAT) 0.4 MG SL tablet Place 1 tablet (0.4 mg total) under the tongue every 5 (five) minutes as needed for chest pain.    nystatin cream (MYCOSTATIN) Apply 1 Application topically as needed for dry skin.    REPATHA SURECLICK [id number] MG/ML SOAJ Inject 140 mg into the skin every 14 (fourteen) days.    sacubitril-valsartan (ENTRESTO) 49-51 MG Take 1 tablet by mouth 2 (two) times daily.    traMADol (ULTRAM) 50 MG tablet Take 1 tablet (50 mg total) by mouth every 6 (six) hours as needed.    zolpidem (AMBIEN) 5 MG tablet Take 2.5 mg by mouth at bedtime.    No facility-administered encounter medications on file as of 06/23/2022.     ONCOLOGIC FAMILY HISTORY:  Family History  Problem Relation Age of Onset   Aortic aneurysm Mother    Clotting disorder Mother    Healthy Father    Heart disease Brother        stints   Breast cancer Maternal Aunt 59   Breast cancer Maternal Grandmother 69   Heart attack Maternal Grandmother      SOCIAL HISTORY:  Social History   Socioeconomic History   Marital status: Widowed    Spouse name: Not on file   Number of children: 2   Years of education: Not on file   Highest education level: Not on file  Occupational History   Not on file  Tobacco Use   Smoking status: Never    Passive exposure: Never   Smokeless tobacco: Never  Vaping Use   Vaping Use: Never  used  Substance and Sexual Activity   Alcohol use: Yes    Alcohol/week: 4.0 - 6.0 standard drinks of alcohol    Types: 4 - 6 Standard drinks or equivalent per week   Drug use: No   Sexual activity: Not Currently    Partners: Male    Birth control/protection: Post-menopausal  Other Topics Concern   Not on file  Social History Narrative   Not on file   Social Determinants of Health   Financial Resource Strain:  Low Risk  (08/05/2021)   Overall Financial Resource Strain (CARDIA)    Difficulty of Paying Living Expenses: Not hard at all  Food Insecurity: No Food Insecurity (08/05/2021)   Hunger Vital Sign    Worried About Running Out of Food in the Last Year: Never true    Ran Out of Food in the Last Year: Never true  Transportation Needs: No Transportation Needs (08/05/2021)   PRAPARE - Hydrologist (Medical): No    Lack of Transportation (Non-Medical): No  Physical Activity: Not on file  Stress: Not on file  Social Connections: Not on file  Intimate Partner Violence: Not on file     OBSERVATIONS/OBJECTIVE:  There were no vitals taken for this visit. GENERAL: Patient is a well appearing female in no acute distress HEENT:  Sclerae anicteric.  Oropharynx clear and moist. No ulcerations or evidence of oropharyngeal candidiasis. Neck is supple.  NODES:  No cervical, supraclavicular, or axillary lymphadenopathy palpated.  BREAST EXAM:  Left breast s/p lumpectomy and radiation, seroma and scar tissue noted in left upper outer breast, no sign of local recurrence, right breast is benign LUNGS:  Clear to auscultation bilaterally.  No wheezes or rhonchi. HEART:  Regular rate and rhythm. No murmur appreciated. ABDOMEN:  Soft, nontender.  Positive, normoactive bowel sounds. No organomegaly palpated. MSK:  No focal spinal tenderness to palpation. Full range of motion bilaterally in the upper extremities. EXTREMITIES:  No peripheral edema.   SKIN:  Clear with no  obvious rashes or skin changes. No nail dyscrasia. NEURO:  Nonfocal. Well oriented.  Appropriate affect.   LABORATORY DATA:  None for this visit.  DIAGNOSTIC IMAGING:  None for this visit.      ASSESSMENT AND PLAN:  Ms.. Powers is a pleasant 82 y.o. 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  She presents to the Survivorship Clinic for our initial meeting and routine follow-up post-completion of treatment for breast cancer.    1. Stage IB left breast cancer:  Marissa Powers is continuing to recover from definitive treatment for breast cancer. She will follow-up with her medical oncologist, Dr. Lindi Adie in in 6 months with history and physical exam per surveillance protocol.  She will continue her anti-estrogen therapy with Letrozole. Thus far, she is tolerating the Letrozole well, with minimal side effects. She was instructed to make Dr. Lindi Adie or myself aware if she begins to experience any worsening side effects of the medication and I could see her back in clinic to help manage those side effects, as needed. Her mammogram is due 09/2022; orders placed today.  She does have a seroma in the left breast therefore I placed orders for an ultrasound to be completed with her mammogram in June so we can get a good baseline. Today, a comprehensive survivorship care plan and treatment summary was reviewed with the patient today detailing her breast cancer diagnosis, treatment course, potential late/long-term effects of treatment, appropriate follow-up care with recommendations for the future, and patient education resources.  A copy of this summary, along with a letter will be sent to the patient's primary care provider via mail/fax/In Basket message after today's visit.    2. Bone health:  Given Marissa Powers's age/history of breast cancer and her  current treatment regimen including anti-estrogen therapy with Letrozole, she is at risk for bone demineralization.  Her last DEXA scan  was 09/15/2021, which she completes with her PCP at [facility].  We are in the process of obtaining records from their office.  I recommended repeat testing in 09/2023 and to continue testing at the same office. She was given education on specific activities to promote bone health.  3. Cancer screening:  Due to Marissa Powers's history and her age, she should receive screening for skin cancers.  The information and recommendations are listed on the patient's comprehensive care plan/treatment summary and were reviewed in detail with the patient.    4. Health maintenance and wellness promotion: Marissa Powers was encouraged to consume 5-7 servings of fruits and vegetables per day. We reviewed the "Nutrition Rainbow" handout.  She was also encouraged to engage in moderate to vigorous exercise for 30 minutes per day most days of the week.  She was instructed to limit her alcohol consumption and continue to abstain from tobacco use.     5. Support services/counseling: It is not uncommon for this period of the patient's cancer care trajectory to be one of many emotions and stressors. She was given information regarding our available services and encouraged to contact me with any questions or for help enrolling in any of our support group/programs.    Follow up instructions:    -Return to cancer center in 6 months for f/u with Dr. Lindi Adie  -Mammogram and ultrasound due in 09/2022 at [facility] -Bone density due in 09/2023 (with Dr. Silvestre Mesi office) -f/u with Dr. Haroldine Laws 08/2022 -Follow up with surgery 1 year -She is welcome to return back to the Survivorship Clinic at any time; no additional follow-up needed at this time.  -Consider referral back to survivorship as a long-term survivor for continued surveillance  The patient was provided an opportunity to ask questions and  all were answered. The patient agreed with the plan and demonstrated an understanding of the instructions.   Total encounter time:60 minutes*in face-to-face visit time, chart review, lab review, care coordination, order entry, and documentation of the encounter time.    Wilber Bihari, NP 06/23/22 8:10 AM Medical Oncology and Hematology [facility] [place], [place] [address] Tel. [phone]    Fax. [phone]  *Total Encounter Time as defined by the Centers for Medicare and Medicaid Services includes, in addition to the face-to-face time of a patient visit (documented in the note above) non-face-to-face time: obtaining and reviewing outside history, ordering and reviewing medications, tests or procedures, care coordination (communications with other health care professionals or caregivers) and documentation in the medical record.

## 2022-06-23 NOTE — Progress Notes (Signed)
Faxed Diagnostic Mammogram and Ultrasound orders per NP to [facility] at [phone]. Fax confirmation received.

## 2022-06-23 NOTE — Progress Notes (Signed)
[facility] and requested fax of bone density test per Wilber Bihari NP. Provided them with fax # [phone] and they told this LPN they will fax the documents.

## 2022-06-24 ENCOUNTER — Encounter (HOSPITAL_COMMUNITY)
Admission: RE | Admit: 2022-06-24 | Discharge: 2022-06-24 | Disposition: A | Discharge status: Home / Self Care | Payer: Medicare Other | Source: Ambulatory Visit | Attending: Internal Medicine | Admitting: Internal Medicine

## 2022-06-24 ENCOUNTER — Encounter (HOSPITAL_COMMUNITY): Payer: Medicare Other

## 2022-06-24 ENCOUNTER — Telehealth: Payer: Self-pay | Admitting: Adult Health

## 2022-06-24 DIAGNOSIS — Z952 Presence of prosthetic heart valve: Secondary | ICD-10-CM

## 2022-06-24 NOTE — Telephone Encounter (Signed)
Scheduled appointment per 3/12 los. Left voicemail.

## 2022-06-26 ENCOUNTER — Encounter (HOSPITAL_COMMUNITY)
Admission: RE | Admit: 2022-06-26 | Discharge: 2022-06-26 | Disposition: A | Discharge status: Home / Self Care | Payer: Medicare Other | Source: Ambulatory Visit | Attending: Internal Medicine | Admitting: Internal Medicine

## 2022-06-26 ENCOUNTER — Encounter (HOSPITAL_COMMUNITY): Payer: Medicare Other

## 2022-06-26 DIAGNOSIS — Z952 Presence of prosthetic heart valve: Secondary | ICD-10-CM

## 2022-06-29 ENCOUNTER — Encounter (HOSPITAL_COMMUNITY): Payer: Medicare Other

## 2022-06-29 ENCOUNTER — Encounter (HOSPITAL_COMMUNITY)
Admission: RE | Admit: 2022-06-29 | Discharge: 2022-06-29 | Disposition: A | Discharge status: Home / Self Care | Payer: Medicare Other | Source: Ambulatory Visit | Attending: Internal Medicine | Admitting: Internal Medicine

## 2022-06-29 ENCOUNTER — Other Ambulatory Visit (HOSPITAL_COMMUNITY): Payer: Self-pay | Admitting: Cardiology

## 2022-06-29 DIAGNOSIS — Z952 Presence of prosthetic heart valve: Secondary | ICD-10-CM

## 2022-06-29 MED ORDER — APIXABAN 5 MG PO TABS: 5.0000 mg | ORAL_TABLET | Freq: Two times a day (BID) | ORAL | 11 refills | Status: DC

## 2022-06-29 NOTE — Progress Notes (Signed)
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 Take on Mondays, Wednesdays, and Fridays., Disp: , Rfl:    apixaban (ELIQUIS) 5 MG TABS tablet, Take 1 tablet (5 mg total) by mouth 2 (two) times daily., Disp: 60 tablet, Rfl: 11   b complex vitamins capsule, Take 1 capsule by mouth daily., Disp: , Rfl:    Calcium Carb-Cholecalciferol (CALCIUM + D3 PO), Take 1 tablet by mouth in the morning., Disp: , Rfl:    cholecalciferol (VITAMIN D3) 25 MCG (1000 UNIT) tablet, Take 1,000 Units by mouth in the morning., Disp: , Rfl:    dapagliflozin propanediol (FARXIGA) 10 MG TABS tablet, Take 1 tablet (10 mg total) by mouth daily before breakfast., Disp: 30 tablet, Rfl: 11   denosumab (PROLIA) 60 MG/ML SOSY injection, Inject 60 mg into the skin every 6 (six) months., Disp: , Rfl:    dicyclomine (BENTYL) 10 MG capsule, Take 10-20 mg by mouth 3 (three) times daily as needed for spasms., Disp: , Rfl:    folic acid (FOLVITE) 1 MG tablet, Take 2 mg by mouth in the morning., Disp: , Rfl:    letrozole (FEMARA) 2.5 MG tablet, TAKE 1 TABLET(2.5 MG) BY MOUTH DAILY, Disp: 90 tablet, Rfl: 3    levothyroxine (SYNTHROID) 100 MCG tablet, Take 100 mcg by mouth daily before breakfast., Disp: , Rfl:    loperamide (IMODIUM) 2 MG capsule, Take 2-4 mg by mouth 4 (four) times daily as needed (upset stomach/diarrhea/loose stools)., Disp: , Rfl:    metoprolol succinate (TOPROL XL) 25 MG 24 hr tablet, Take 1 tablet (25 mg total) by mouth daily., Disp: 90 tablet, Rfl: 3   mometasone (ELOCON) 0.1 % ointment, Apply topically to vulvar skin no more than twice weekly, Disp: 45 g, Rfl: 1   nitroGLYCERIN (NITROSTAT) 0.4 MG SL tablet, Place 1 tablet (0.4 mg total) under the tongue every 5 (five) minutes as needed for chest pain., Disp: 25 tablet, Rfl: 3   nystatin cream (MYCOSTATIN), Apply 1 Application topically as needed for dry skin., Disp: , Rfl:    REPATHA SURECLICK [id number] MG/ML SOAJ, Inject 140 mg into the skin every 14 (fourteen) days., Disp: , Rfl:    sacubitril-valsartan (ENTRESTO) 49-51 MG, Take 1 tablet by mouth 2 (two) times daily., Disp: 60 tablet, Rfl: 3   traMADol (ULTRAM) 50 MG tablet, Take 1 tablet (50 mg total) by mouth every 6 (six) hours as needed., Disp: 10 tablet, Rfl: 0   zolpidem (AMBIEN) 5 MG tablet, Take 2.5 mg by mouth at bedtime., Disp: , Rfl:   Past Medical History: Past Medical History:  Diagnosis Date   Arthritis    osteoarthritis  Breast cancer ([place]) 11/2021   CAD (coronary artery disease)    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 Angelena Form & Dr. Cyndia Bent .   Severe aortic stenosis     Tobacco Use: Social History   Tobacco Use  Smoking Status Never   Passive exposure: Never  Smokeless Tobacco Never    Labs: Review Flowsheet  More data exists      Latest Ref Rng & Units 09/22/2018 10/28/2018 06/19/2021 07/11/2021 08/19/2021  Labs for ITP Cardiac and Pulmonary Rehab  Cholestrol 100 - 199 mg/dL 183  122  - - -  LDL (calc) 0 - 99 mg/dL 108  45  - - -  HDL-C >39 mg/dL 61  55  - - -  Trlycerides <150 mg/dL 71  108  - 100  -  PH, Arterial 7.35 - 7.45 - - 7.457  - -  PCO2 arterial 32 - 48 mmHg - - 33.4  - -  Bicarbonate 20.0 - 28.0 mmol/L 20.0 - 28.0 mmol/L - - 24.5  24.6  23.6  - -  TCO2 22 - 32 mmol/L - - 26  26  25   - 24  22   Acid-base deficit 0.0 - 2.0 mmol/L 0.0 - 2.0 mmol/L - - 1.0  1.0  - -  O2 Saturation % % - - 74  75  99  - -    Capillary Blood Glucose: No results found for: "GLUCAP"   Exercise Target Goals: Exercise Program Goal: Individual exercise prescription set using results from initial 6 min walk test and THRR while considering  patient's activity barriers and safety.   Exercise Prescription Goal: Initial exercise prescription builds to 30-45 minutes a day of aerobic activity, 2-3 days per week.  Home exercise guidelines will be given to patient during program as part of exercise prescription that the participant will acknowledge.  Activity Barriers & Risk Stratification:  Activity Barriers & Cardiac Risk Stratification - 06/09/22 0924       Activity Barriers & Cardiac Risk Stratification   Activity Barriers Arthritis;Left Knee Replacement;Right Knee Replacement;Other (comment)    Comments Polymyalgia rheumatica    Cardiac Risk Stratification High             6 Minute Walk:  6 Minute Walk     Row Name 06/09/22 1015         6 Minute Walk   Phase Initial     Distance 1523 feet     Walk Time 6 minutes     # of Rest Breaks 0     MPH 2.88     METS 2.81     RPE 11      Perceived Dyspnea  1     VO2 Peak 9.85     Symptoms Yes (comment)     Comments Mild SOB,  RPD=1.     Resting HR 57 bpm     Resting BP 138/88     Resting Oxygen Saturation  97 %     Exercise Oxygen Saturation  during 6 min walk 95 %     Max Ex. HR 88 bpm     Max Ex. BP 168/92     2 Minute Post BP 142/88              Oxygen Initial Assessment:   Oxygen Re-Evaluation:   Oxygen Discharge (Final Oxygen Re-Evaluation):   Initial Exercise Prescription:  Initial Exercise Prescription - 06/09/22 1100       Date of Initial Exercise RX and Referring Provider   Date 06/09/22    Referring Provider Jolaine Artist, MD    Expected Discharge Date 08/21/22      NuStep   Level 2    SPM 85    Minutes 15    METs 2.5      Recumbant Elliptical   Level 1    Minutes 15    METs 2.8      Prescription Details   Frequency (times per week) 3    Duration Progress to 30 minutes of continuous aerobic without signs/symptoms of physical distress      Intensity   THRR 40-80% of Max Heartrate 56-111    Ratings of Perceived Exertion 11-13    Perceived Dyspnea 0-4      Progression   Progression Continue to progress workloads to maintain intensity without signs/symptoms of physical distress.      Resistance Training   Training Prescription Yes    Weight 2 lbs    Reps 10-15             Perform Capillary Blood Glucose checks as needed.  Exercise Prescription Changes:   Exercise Prescription Changes     Row Name 06/15/22 [phone] 06/26/22 0832           Response to Exercise   Blood Pressure (Admit) 122/68 124/74      Blood Pressure (Exercise) 152/82 146/88      Blood Pressure (Exit) 114/64 104/58      Heart Rate (Admit) 66 bpm 69 bpm      Heart Rate (Exercise) 90 bpm 106 bpm      Heart Rate (Exit) 73 bpm 73 bpm      Rating of Perceived Exertion (Exercise) 12.5 12.5      Perceived Dyspnea (Exercise) 0 0      Symptoms 0 0      Comments Pt first day in the [facility] CRP2  program Reviewed MET's and goals      Duration Progress to 30 minutes of  aerobic without signs/symptoms of physical distress Progress to 30 minutes of  aerobic without signs/symptoms of physical distress      Intensity THRR unchanged THRR unchanged        Progression   Progression Continue to progress workloads to maintain intensity without signs/symptoms of physical distress. Continue to progress workloads to maintain intensity without signs/symptoms of physical distress.      Average METs 2.7 3.9        Resistance Training   Training Prescription Yes Yes      Weight 2 lbs 2 lbs      Reps 10-15 10-15      Time 10 Minutes 10 Minutes        NuStep   Level 2 2      SPM 77 66  Minutes 15 15      METs 2.3 2.8        Recumbant Elliptical   Level 1 1      RPM -- 66      Watts -- 96      Minutes 15 15      METs 3.1 5               Exercise Comments:   Exercise Comments     Row Name 06/15/22 0953 06/26/22 0839         Exercise Comments Pt first day in the [place] program. Pt tolerated exercise well with an average MET level of 2.7. Pt is learning her THRR, RPE and ExRx. Off to a great start. Reviewed MET's and goals with pt today. Pt tolerated exercise well with an average MET level of 3.9. Pt is having continued concerns with increased fatigue, evening GERD symptoms and SOB with exercise, referred to nurse. Did not review home exercise due to symptoms and fatigue with current exercise plan.               Exercise Goals and Review:   Exercise Goals     Row Name 06/09/22 [id number]             Exercise Goals   Increase Physical Activity Yes       Intervention Provide advice, education, support and counseling about physical activity/exercise needs.;Develop an individualized exercise prescription for aerobic and resistive training based on initial evaluation findings, risk stratification, comorbidities and participant's personal goals.       Expected Outcomes  Short Term: Attend rehab on a regular basis to increase amount of physical activity.;Long Term: Add in home exercise to make exercise part of routine and to increase amount of physical activity.;Long Term: Exercising regularly at least 3-5 days a week.       Increase Strength and Stamina Yes       Intervention Provide advice, education, support and counseling about physical activity/exercise needs.;Develop an individualized exercise prescription for aerobic and resistive training based on initial evaluation findings, risk stratification, comorbidities and participant's personal goals.       Expected Outcomes Short Term: Increase workloads from initial exercise prescription for resistance, speed, and METs.;Short Term: Perform resistance training exercises routinely during rehab and add in resistance training at home;Long Term: Improve cardiorespiratory fitness, muscular endurance and strength as measured by increased METs and functional capacity (6MWT)       Able to understand and use rate of perceived exertion (RPE) scale Yes       Intervention Provide education and explanation on how to use RPE scale       Expected Outcomes Short Term: Able to use RPE daily in rehab to express subjective intensity level;Long Term:  Able to use RPE to guide intensity level when exercising independently       Knowledge and understanding of Target Heart Rate Range (THRR) Yes       Intervention Provide education and explanation of THRR including how the numbers were predicted and where they are located for reference       Expected Outcomes Short Term: Able to state/look up THRR;Long Term: Able to use THRR to govern intensity when exercising independently;Short Term: Able to use daily as guideline for intensity in rehab       Able to check pulse independently Yes       Intervention Provide education and demonstration on how to check pulse in carotid and radial  arteries.;Review the importance of being able to check your own  pulse for safety during independent exercise       Expected Outcomes Long Term: Able to check pulse independently and accurately;Short Term: Able to explain why pulse checking is important during independent exercise       Understanding of Exercise Prescription Yes       Intervention Provide education, explanation, and written materials on patient's individual exercise prescription       Expected Outcomes Short Term: Able to explain program exercise prescription;Long Term: Able to explain home exercise prescription to exercise independently                Exercise Goals Re-Evaluation :  Exercise Goals Re-Evaluation     Row Name 06/15/22 0952 06/26/22 0834           Exercise Goal Re-Evaluation   Exercise Goals Review Increase Physical Activity;Understanding of Exercise Prescription;Increase Strength and Stamina;Knowledge and understanding of Target Heart Rate Range (THRR);Able to understand and use rate of perceived exertion (RPE) scale Increase Physical Activity;Understanding of Exercise Prescription;Increase Strength and Stamina;Knowledge and understanding of Target Heart Rate Range (THRR);Able to understand and use rate of perceived exertion (RPE) scale      Comments Pt first day in the [facility] I[facility] program. Pt tolerated exercise well with an average MET level of 2.7. Pt is learning her THRR, RPE and ExRx Reviewed MET's and goals with pt today. Pt tolerated exercise well with an average MET level of 3.9. Pt is having continued concerns with increased fatigue, evening GERD symptoms and SOB with exercise, referred to nurse. Did not review home exercise due to symptoms and fatigue with current exercise plan.      Expected Outcomes Will continue to monitor pt and progress workloads as tolerated without sign or symptom Will continue to monitor pt and progress workloads as tolerated without sign or symptom               Discharge Exercise Prescription (Final Exercise Prescription  Changes):  Exercise Prescription Changes - 06/26/22 0832       Response to Exercise   Blood Pressure (Admit) 124/74    Blood Pressure (Exercise) 146/88    Blood Pressure (Exit) 104/58    Heart Rate (Admit) 69 bpm    Heart Rate (Exercise) 106 bpm    Heart Rate (Exit) 73 bpm    Rating of Perceived Exertion (Exercise) 12.5    Perceived Dyspnea (Exercise) 0    Symptoms 0    Comments Reviewed MET's and goals    Duration Progress to 30 minutes of  aerobic without signs/symptoms of physical distress    Intensity THRR unchanged      Progression   Progression Continue to progress workloads to maintain intensity without signs/symptoms of physical distress.    Average METs 3.9      Resistance Training   Training Prescription Yes    Weight 2 lbs    Reps 10-15    Time 10 Minutes      NuStep   Level 2    SPM 66    Minutes 15    METs 2.8      Recumbant Elliptical   Level 1    RPM 66    Watts 96    Minutes 15    METs 5             Nutrition:  Target Goals: Understanding of nutrition guidelines, daily intake of sodium 1500mg , cholesterol 200mg , calories 30% from fat  and 7% or less from saturated fats, daily to have 5 or more servings of fruits and vegetables.  Biometrics:  Pre Biometrics - 06/09/22 0906       Pre Biometrics   Waist Circumference 38 inches    Hip Circumference 41.75 inches    Waist to Hip Ratio 0.91 %    Triceps Skinfold 25 mm    % Body Fat 39.9 %    Grip Strength 23 kg    Flexibility 0 in    Single Leg Stand 30 seconds              Nutrition Therapy Plan and Nutrition Goals:  Nutrition Therapy & Goals - 06/15/22 0954       Nutrition Therapy   Diet Heart Healthy Diet      Personal Nutrition Goals   Nutrition Goal Patient to identify strategies for reducing cardiovascular risk by attending the weekly [facility] education and nutrition series    Personal Goal #2 Patient to improve diet quality by using the plate method as a daily guide for  meal planning to include lean protein/plant protein, fruits, vegetables, whole grains, nonfat dairy as part of well balanced diet    Personal Goal #3 Patient to reduce sodium intake to 1500mg  per day    Comments Patient's most recent lipid panel (from 02/10/2022) show LDL not at goal of <70 (90); she is taking Repatha. Per documenation from Heart failure clinic on 05/06/2022, recommend weight loss of 10-15# to aid with DOE. [place] will continue to benefit from participation in intensive cardiac rehab for nutrition, exercise, and lifestyle modification.      Intervention Plan   Intervention Prescribe, educate and counsel regarding individualized specific dietary modifications aiming towards targeted core components such as weight, hypertension, lipid management, diabetes, heart failure and other comorbidities.;Nutrition handout(s) given to patient.    Expected Outcomes Short Term Goal: Understand basic principles of dietary content, such as calories, fat, sodium, cholesterol and nutrients.;Long Term Goal: Adherence to prescribed nutrition plan.             Nutrition Assessments:  Nutrition Assessments - 06/26/22 0927       Rate Your Plate Scores   Pre Score 63            MEDIFICTS Score Key: ?70 Need to make dietary changes  40-70 Heart Healthy Diet ? 40 Therapeutic Level Cholesterol Diet   Flowsheet Row INTENSIVE CARDIAC REHAB from 06/26/2022 in [facility]  Picture Your Plate Total Score on Admission 63      Picture Your Plate Scores: [id number] Unhealthy dietary pattern with much room for improvement. 41-50 Dietary pattern unlikely to meet recommendations for good health and room for improvement. 51-60 More healthful dietary pattern, with some room for improvement.  >60 Healthy dietary pattern, although there may be some specific behaviors that could be improved.    Nutrition Goals Re-Evaluation:  Nutrition Goals  Re-Evaluation     [place] Name 06/15/22 0954             Goals   Current Weight 167 lb 5.3 oz (75.9 kg)       Comment A1c WNL, LDL 90       Expected Outcome Patient's most recent lipid panel (from 02/10/2022) show LDL not at goal of <70 (90); she is taking Repatha. Per documenation from Heart failure clinic on 05/06/2022, recommend weight loss of 10-15# to aid with DOE. [place] will continue to benefit  from participation in intensive cardiac rehab for nutrition, exercise, and lifestyle modification.                Nutrition Goals Re-Evaluation:  Nutrition Goals Re-Evaluation     [place] Name 06/15/22 0954             Goals   Current Weight 167 lb 5.3 oz (75.9 kg)       Comment A1c WNL, LDL 90       Expected Outcome Patient's most recent lipid panel (from 02/10/2022) show LDL not at goal of <70 (90); she is taking Repatha. Per documenation from Heart failure clinic on 05/06/2022, recommend weight loss of 10-15# to aid with DOE. [place] will continue to benefit from participation in intensive cardiac rehab for nutrition, exercise, and lifestyle modification.                Nutrition Goals Discharge (Final Nutrition Goals Re-Evaluation):  Nutrition Goals Re-Evaluation - 06/15/22 0954       Goals   Current Weight 167 lb 5.3 oz (75.9 kg)    Comment A1c WNL, LDL 90    Expected Outcome Patient's most recent lipid panel (from 02/10/2022) show LDL not at goal of <70 (90); she is taking Repatha. Per documenation from Heart failure clinic on 05/06/2022, recommend weight loss of 10-15# to aid with DOE. [place] will continue to benefit from participation in intensive cardiac rehab for nutrition, exercise, and lifestyle modification.             Psychosocial: Target Goals: Acknowledge presence or absence of significant depression and/or stress, maximize coping skills, provide positive support system. Participant is able to verbalize types and ability to use techniques and skills needed  for reducing stress and depression.  Initial Review & Psychosocial Screening:  Initial Psych Review & Screening - 06/09/22 1141       Initial Review   Current issues with None Identified      Family Dynamics   Good Support System? Yes    Comments Patient travels and does activities with friends.      Barriers   Psychosocial barriers to participate in program There are no identifiable barriers or psychosocial needs.             Quality of Life Scores:  Quality of Life - 06/09/22 1131       Quality of Life   Select Quality of Life      Quality of Life Scores   Health/Function Pre 25.15 %    Socioeconomic Pre 30 %    Psych/Spiritual Pre 28.79 %    Family Pre 28.5 %    GLOBAL Pre 27.42 %            Scores of 19 and below usually indicate a poorer quality of life in these areas.  A difference of  2-3 points is a clinically meaningful difference.  A difference of 2-3 points in the total score of the Quality of Life Index has been associated with significant improvement in overall quality of life, self-image, physical symptoms, and general health in studies assessing change in quality of life.  PHQ-9: Review Flowsheet       06/09/2022 07/17/2021  Depression screen PHQ 2/9  Decreased Interest 0 0  Down, Depressed, Hopeless 0 1  PHQ - 2 Score 0 1  Altered sleeping 0 -  Tired, decreased energy 1 -  Change in appetite 0 -  Feeling bad or failure about yourself  0 -  Trouble  concentrating 0 -  Moving slowly or fidgety/restless 0 -  Suicidal thoughts 0 -  PHQ-9 Score 1 -  Difficult doing work/chores Not difficult at all -   Interpretation of Total Score  Total Score Depression Severity:  1-4 = Minimal depression, 5-9 = Mild depression, 10-14 = Moderate depression, 15-19 = Moderately severe depression, 20-27 = Severe depression   Psychosocial Evaluation and Intervention:   Psychosocial Re-Evaluation:  Psychosocial Re-Evaluation     [place] Name 06/15/22 1646              Psychosocial Re-Evaluation   Current issues with None Identified       Interventions Encouraged to attend Cardiac Rehabilitation for the exercise       Continue Psychosocial Services  No Follow up required                Psychosocial Discharge (Final Psychosocial Re-Evaluation):  Psychosocial Re-Evaluation - 06/15/22 1646       Psychosocial Re-Evaluation   Current issues with None Identified    Interventions Encouraged to attend Cardiac Rehabilitation for the exercise    Continue Psychosocial Services  No Follow up required             Vocational Rehabilitation: Provide vocational rehab assistance to qualifying candidates.   Vocational Rehab Evaluation & Intervention:  Vocational Rehab - 06/09/22 1143       Initial Vocational Rehab Evaluation & Intervention   Assessment shows need for Vocational Rehabilitation No      Vocational Rehab Re-Evaulation   Comments Patient is retired.             Education: Education Goals: Education classes will be provided on a weekly basis, covering required topics. Participant will state understanding/return demonstration of topics presented.    Education     Row Name 06/15/22 0900     Education   Cardiac Education Topics [facility]   [facility] Workshops     Workshops   Educator Exercise Physiologist   [facility] Psychosocial   Psychosocial Workshop Other  [employer] of a Healthy Outlook   Instruction Review Code 1- Verbalizes Understanding   Class Start Time [phone]   Class Stop Time 0856   Class Time Calculation (min) 44 min    [place] Name 06/17/22 1000     Education   Cardiac Education Topics [employer]   Educator Dietitian   Weekly Topic Adding Flavor - Sodium-Free   Instruction Review Code 1- Verbalizes Understanding   Class Start Time 0815   Class Stop Time 0858   Class Time Calculation (min) 43 min    [place] Name 06/19/22 1400     Education    Cardiac Education Topics [facility]   Academic librarian General Education   General Education Heart Disease Risk Reduction   Instruction Review Code 1- Verbalizes Understanding   Class Start Time 0815   Class Stop Time [phone]   Class Time Calculation (min) 37 min    [place] Name 06/22/22 1200     Education   Cardiac Education Topics [facility]   [facility] Workshops     Workshops   Educator Exercise Physiologist   [facility] Exercise   Exercise Workshop Hotel manager and Fall Prevention   Instruction Review Code 1- Verbalizes Understanding   Class Start Time 0815   Class Stop Time 0855   Class Time  Calculation (min) 40 min    Row Name 06/24/22 1000     Education   Cardiac Education Topics [employer]   Educator Dietitian   Weekly Topic Fast and Healthy Breakfasts   Instruction Review Code 1- Verbalizes Understanding   Class Start Time 0813   Class Stop Time 0856   Class Time Calculation (min) 43 min    [place] Name 06/26/22 0900     Education   Cardiac Education Topics [facility]   [facility] Core Videos     Core Videos   Educator Dietitian   Select Nutrition   Nutrition Overview of the [facility] Eating Plan   Instruction Review Code 1- Verbalizes Understanding   Class Start Time 0815   Class Stop Time 0905   Class Time Calculation (min) 50 min    [place] [place] Name 06/29/22 0900     Education   Cardiac Education Topics Pritikin   [facility] Workshops     Workshops   Educator Exercise Physiologist   [facility] Psychosocial   Psychosocial Workshop Recognizing and Reducing Stress   Instruction Review Code 1- Verbalizes Understanding   Class Start Time [phone]   Class Stop Time [phone]   Class Time Calculation (min) 41 min            Core Videos: Exercise    Move It!  Clinical staff conducted group or individual video education with verbal and written material and guidebook.  Patient learns  the recommended Pritikin exercise program. Exercise with the goal of living a long, healthy life. Some of the health benefits of exercise include controlled diabetes, healthier blood pressure levels, improved cholesterol levels, improved heart and lung capacity, improved sleep, and better body composition. Everyone should speak with their doctor before starting or changing an exercise routine.  Biomechanical Limitations Clinical staff conducted group or individual video education with verbal and written material and guidebook.  Patient learns how biomechanical limitations can impact exercise and how we can mitigate and possibly overcome limitations to have an impactful and balanced exercise routine.  Body Composition Clinical staff conducted group or individual video education with verbal and written material and guidebook.  Patient learns that body composition (ratio of muscle mass to fat mass) is a key component to assessing overall fitness, rather than body weight alone. Increased fat mass, especially visceral belly fat, can put [place] at increased risk for metabolic syndrome, type 2 diabetes, heart disease, and even death. It is recommended to combine diet and exercise (cardiovascular and resistance training) to improve your body composition. Seek guidance from your physician and exercise physiologist before implementing an exercise routine.  Exercise Action Plan Clinical staff conducted group or individual video education with verbal and written material and guidebook.  Patient learns the recommended strategies to achieve and enjoy long-term exercise adherence, including variety, self-motivation, self-efficacy, and positive decision making. Benefits of exercise include fitness, good health, weight management, more energy, better sleep, less stress, and overall well-being.  Medical   Heart Disease Risk Reduction Clinical staff conducted group or individual video education with verbal and written  material and guidebook.  Patient learns our heart is our most vital organ as it circulates oxygen, nutrients, white blood cells, and hormones throughout the entire body, and carries waste away. Data supports a plant-based eating plan like the Pritikin Program for its effectiveness in slowing progression of and reversing heart disease. The video provides a number of recommendations to address heart disease.  Metabolic Syndrome and Belly Fat  Clinical staff conducted group or individual video education with verbal and written material and guidebook.  Patient learns what metabolic syndrome is, how it leads to heart disease, and how one can reverse it and keep it from coming back. You have metabolic syndrome if you have 3 of the following 5 criteria: abdominal obesity, high blood pressure, high triglycerides, low HDL cholesterol, and high blood sugar.  Hypertension and Heart Disease Clinical staff conducted group or individual video education with verbal and written material and guidebook.  Patient learns that high blood pressure, or hypertension, is very common in the [place]. Hypertension is largely due to excessive salt intake, but other important risk factors include being overweight, physical inactivity, drinking too much alcohol, smoking, and not eating enough potassium from fruits and vegetables. High blood pressure is a leading risk factor for heart attack, stroke, congestive heart failure, dementia, kidney failure, and premature death. Long-term effects of excessive salt intake include stiffening of the arteries and thickening of heart muscle and organ damage. Recommendations include ways to reduce hypertension and the risk of heart disease.  Diseases of Our Time - Focusing on Diabetes Clinical staff conducted group or individual video education with verbal and written material and guidebook.  Patient learns why the best way to stop diseases of our time is prevention, through food and other  lifestyle changes. Medicine (such as prescription pills and surgeries) is often only a Band-Aid on the problem, not a long-term solution. Most common diseases of our time include obesity, type 2 diabetes, hypertension, heart disease, and cancer. The Pritikin Program is recommended and has been proven to help reduce, reverse, and/or prevent the damaging effects of metabolic syndrome.  Nutrition   Overview of the Pritikin Eating Plan  Clinical staff conducted group or individual video education with verbal and written material and guidebook.  Patient learns about the [place] for disease risk reduction. The [place] emphasizes a wide variety of unrefined, minimally-processed carbohydrates, like fruits, vegetables, whole grains, and legumes. Go, Caution, and Stop food choices are explained. Plant-based and lean animal proteins are emphasized. Rationale provided for low sodium intake for blood pressure control, low added sugars for blood sugar stabilization, and low added fats and oils for coronary artery disease risk reduction and weight management.  Calorie Density  Clinical staff conducted group or individual video education with verbal and written material and guidebook.  Patient learns about calorie density and how it impacts the Pritikin Eating Plan. Knowing the characteristics of the food you choose will help you decide whether those foods will lead to weight gain or weight loss, and whether you want to consume more or less of them. Weight loss is usually a side effect of the Pritikin Eating Plan because of its focus on low calorie-dense foods.  Label Reading  Clinical staff conducted group or individual video education with verbal and written material and guidebook.  Patient learns about the Pritikin recommended label reading guidelines and corresponding recommendations regarding calorie density, added sugars, sodium content, and whole grains.  Dining Out - Part 1   Clinical staff conducted group or individual video education with verbal and written material and guidebook.  Patient learns that restaurant meals can be sabotaging because they can be so high in calories, fat, sodium, and/or sugar. Patient learns recommended strategies on how to positively address this and avoid unhealthy pitfalls.  Facts on Fats  Clinical staff conducted group or individual video education with  verbal and written material and guidebook.  Patient learns that lifestyle modifications can be just as effective, if not more so, as many medications for lowering your risk of heart disease. A Pritikin lifestyle can help to reduce your risk of inflammation and atherosclerosis (cholesterol build-up, or plaque, in the artery walls). Lifestyle interventions such as dietary choices and physical activity address the cause of atherosclerosis. A review of the types of fats and their impact on blood cholesterol levels, along with dietary recommendations to reduce fat intake is also included.  Nutrition Action Plan  Clinical staff conducted group or individual video education with verbal and written material and guidebook.  Patient learns how to incorporate Pritikin recommendations into their lifestyle. Recommendations include planning and keeping personal health goals in mind as an important part of their success.  Healthy Mind-Set    Healthy Minds, Bodies, Hearts  Clinical staff conducted group or individual video education with verbal and written material and guidebook.  Patient learns how to identify when they are stressed. Video will discuss the impact of that stress, as well as the many benefits of stress management. Patient will also be introduced to stress management techniques. The way we think, act, and feel has an impact on our hearts.  How Our Thoughts Can Heal Our Hearts  Clinical staff conducted group or individual video education with verbal and written material and guidebook.   Patient learns that negative thoughts can cause depression and anxiety. This can result in negative lifestyle behavior and serious health problems. Cognitive behavioral therapy is an effective method to help control our thoughts in order to change and improve our emotional outlook.  Additional Videos:  Exercise    Improving Performance  Clinical staff conducted group or individual video education with verbal and written material and guidebook.  Patient learns to use a non-linear approach by alternating intensity levels and lengths of time spent exercising to help burn more calories and lose more body fat. Cardiovascular exercise helps improve heart health, metabolism, hormonal balance, blood sugar control, and recovery from fatigue. Resistance training improves strength, endurance, balance, coordination, reaction time, metabolism, and muscle mass. Flexibility exercise improves circulation, posture, and balance. Seek guidance from your physician and exercise physiologist before implementing an exercise routine and learn your capabilities and proper form for all exercise.  Introduction to Yoga  Clinical staff conducted group or individual video education with verbal and written material and guidebook.  Patient learns about yoga, a discipline of the coming together of mind, breath, and body. The benefits of yoga include improved flexibility, improved range of motion, better posture and core strength, increased lung function, weight loss, and positive self-image. Yoga's heart health benefits include lowered blood pressure, healthier heart rate, decreased cholesterol and triglyceride levels, improved immune function, and reduced stress. Seek guidance from your physician and exercise physiologist before implementing an exercise routine and learn your capabilities and proper form for all exercise.  Medical   Aging: Enhancing Your Quality of Life  Clinical staff conducted group or individual video education  with verbal and written material and guidebook.  Patient learns key strategies and recommendations to stay in good physical health and enhance quality of life, such as prevention strategies, having an advocate, securing a [place], and keeping a list of medications and system for tracking them. It also discusses how to avoid risk for bone loss.  Biology of Weight Control  Clinical staff conducted group or individual video education with verbal and written  material and guidebook.  Patient learns that weight gain occurs because we consume more calories than we burn (eating more, moving less). Even if your body weight is normal, you may have higher ratios of fat compared to muscle mass. Too much body fat puts you at increased risk for cardiovascular disease, heart attack, stroke, type 2 diabetes, and obesity-related cancers. In addition to exercise, following the [place] can help reduce your risk.  Decoding Lab Results  Clinical staff conducted group or individual video education with verbal and written material and guidebook.  Patient learns that lab test reflects one measurement whose values change over time and are influenced by many factors, including medication, stress, sleep, exercise, food, hydration, pre-existing medical conditions, and more. It is recommended to use the knowledge from this video to become more involved with your lab results and evaluate your numbers to speak with your doctor.   Diseases of Our Time - Overview  Clinical staff conducted group or individual video education with verbal and written material and guidebook.  Patient learns that according to the CDC, 50% to 70% of chronic diseases (such as obesity, type 2 diabetes, elevated lipids, hypertension, and heart disease) are avoidable through lifestyle improvements including healthier food choices, listening to satiety cues, and increased physical activity.  Sleep  Disorders Clinical staff conducted group or individual video education with verbal and written material and guidebook.  Patient learns how good quality and duration of sleep are important to overall health and well-being. Patient also learns about sleep disorders and how they impact health along with recommendations to address them, including discussing with a physician.  Nutrition  Dining Out - Part 2 Clinical staff conducted group or individual video education with verbal and written material and guidebook.  Patient learns how to plan ahead and communicate in order to maximize their dining experience in a healthy and nutritious manner. Included are recommended food choices based on the type of restaurant the patient is visiting.   Fueling a Best boy conducted group or individual video education with verbal and written material and guidebook.  There is a strong connection between our food choices and our health. Diseases like obesity and type 2 diabetes are very prevalent and are in large-part due to lifestyle choices. The Pritikin Eating Plan provides plenty of food and hunger-curbing satisfaction. It is easy to follow, affordable, and helps reduce health risks.  Menu Workshop  Clinical staff conducted group or individual video education with verbal and written material and guidebook.  Patient learns that restaurant meals can sabotage health goals because they are often packed with calories, fat, sodium, and sugar. Recommendations include strategies to plan ahead and to communicate with the manager, chef, or server to help order a healthier meal.  Planning Your Eating Strategy  Clinical staff conducted group or individual video education with verbal and written material and guidebook.  Patient learns about the [place] and its benefit of reducing the risk of disease. The [place] does not focus on calories. Instead, it emphasizes high-quality,  nutrient-rich foods. By knowing the characteristics of the foods, we choose, we can determine their calorie density and make informed decisions.  Targeting Your Nutrition Priorities  Clinical staff conducted group or individual video education with verbal and written material and guidebook.  Patient learns that lifestyle habits have a tremendous impact on disease risk and progression. This video provides eating and physical activity recommendations based on your personal health goals, such as  reducing LDL cholesterol, losing weight, preventing or controlling type 2 diabetes, and reducing high blood pressure.  Vitamins and Minerals  Clinical staff conducted group or individual video education with verbal and written material and guidebook.  Patient learns different ways to obtain key vitamins and minerals, including through a recommended healthy diet. It is important to discuss all supplements you take with your doctor.   Healthy Mind-Set    Smoking Cessation  Clinical staff conducted group or individual video education with verbal and written material and guidebook.  Patient learns that cigarette smoking and tobacco addiction pose a serious health risk which affects millions of people. Stopping smoking will significantly reduce the risk of heart disease, lung disease, and many forms of cancer. Recommended strategies for quitting are covered, including working with your doctor to develop a successful plan.  Culinary   Becoming a Financial trader conducted group or individual video education with verbal and written material and guidebook.  Patient learns that cooking at home can be healthy, cost-effective, quick, and puts them in control. Keys to cooking healthy recipes will include looking at your recipe, assessing your equipment needs, planning ahead, making it simple, choosing cost-effective seasonal ingredients, and limiting the use of added fats, salts, and sugars.  Cooking -  Breakfast and Snacks  Clinical staff conducted group or individual video education with verbal and written material and guidebook.  Patient learns how important breakfast is to satiety and nutrition through the entire day. Recommendations include key foods to eat during breakfast to help stabilize blood sugar levels and to prevent overeating at meals later in the day. Planning ahead is also a key component.  Cooking - Human resources officer conducted group or individual video education with verbal and written material and guidebook.  Patient learns eating strategies to improve overall health, including an approach to cook more at home. Recommendations include thinking of animal protein as a side on your plate rather than center stage and focusing instead on lower calorie dense options like vegetables, fruits, whole grains, and plant-based proteins, such as beans. Making sauces in large quantities to freeze for later and leaving the skin on your vegetables are also recommended to maximize your experience.  Cooking - Healthy Salads and Dressing Clinical staff conducted group or individual video education with verbal and written material and guidebook.  Patient learns that vegetables, fruits, whole grains, and legumes are the foundations of the [place]. Recommendations include how to incorporate each of these in flavorful and healthy salads, and how to create homemade salad dressings. Proper handling of ingredients is also covered. Cooking - Soups and [employer] - Soups and Desserts Clinical staff conducted group or individual video education with verbal and written material and guidebook.  Patient learns that Pritikin soups and desserts make for easy, nutritious, and delicious snacks and meal components that are low in sodium, fat, sugar, and calorie density, while high in vitamins, minerals, and filling fiber. Recommendations include simple and healthy ideas for soups and  desserts.   Overview     The Pritikin Solution Program Overview Clinical staff conducted group or individual video education with verbal and written material and guidebook.  Patient learns that the results of the [place] Program have been documented in more than 100 articles published in peer-reviewed journals, and the benefits include reducing risk factors for (and, in some cases, even reversing) high cholesterol, high blood pressure, type 2 diabetes, obesity, and more! An overview of  the three key pillars of the Pritikin Program will be covered: eating well, doing regular exercise, and having a healthy mind-set.  WORKSHOPS  Exercise: Exercise Basics: Building Your Action Plan Clinical staff led group instruction and group discussion with PowerPoint presentation and patient guidebook. To enhance the learning environment the use of posters, models and videos may be added. At the conclusion of this workshop, patients will comprehend the difference between physical activity and exercise, as well as the benefits of incorporating both, into their routine. Patients will understand the FITT (Frequency, Intensity, Time, and Type) principle and how to use it to build an exercise action plan. In addition, safety concerns and other considerations for exercise and cardiac rehab will be addressed by the presenter. The purpose of this lesson is to promote a comprehensive and effective weekly exercise routine in order to improve patients' overall level of fitness.   Managing Heart Disease: Your Path to a Healthier Heart Clinical staff led group instruction and group discussion with PowerPoint presentation and patient guidebook. To enhance the learning environment the use of posters, models and videos may be added.At the conclusion of this workshop, patients will understand the anatomy and physiology of the heart. Additionally, they will understand how Pritikin's three pillars impact the risk factors, the  progression, and the management of heart disease.  The purpose of this lesson is to provide a high-level overview of the heart, heart disease, and how the Pritikin lifestyle positively impacts risk factors.  Exercise Biomechanics Clinical staff led group instruction and group discussion with PowerPoint presentation and patient guidebook. To enhance the learning environment the use of posters, models and videos may be added. Patients will learn how the structural parts of their bodies function and how these functions impact their daily activities, movement, and exercise. Patients will learn how to promote a neutral spine, learn how to manage pain, and identify ways to improve their physical movement in order to promote healthy living. The purpose of this lesson is to expose patients to common physical limitations that impact physical activity. Participants will learn practical ways to adapt and manage aches and pains, and to minimize their effect on regular exercise. Patients will learn how to maintain good posture while sitting, walking, and lifting.  Balance Training and Fall Prevention  Clinical staff led group instruction and group discussion with PowerPoint presentation and patient guidebook. To enhance the learning environment the use of posters, models and videos may be added. At the conclusion of this workshop, patients will understand the importance of their sensorimotor skills (vision, proprioception, and the vestibular system) in maintaining their ability to balance as they age. Patients will apply a variety of balancing exercises that are appropriate for their current level of function. Patients will understand the common causes for poor balance, possible solutions to these problems, and ways to modify their physical environment in order to minimize their fall risk. The purpose of this lesson is to teach patients about the importance of maintaining balance as they age and ways to  minimize their risk of falling.  WORKSHOPS   Nutrition:  Fueling a Scientist, research (physical sciences) led group instruction and group discussion with PowerPoint presentation and patient guidebook. To enhance the learning environment the use of posters, models and videos may be added. Patients will review the foundational principles of the [place] and understand what constitutes a serving size in each of the food groups. Patients will also learn Pritikin-friendly foods that are better choices when away from home  and review make-ahead meal and snack options. Calorie density will be reviewed and applied to three nutrition priorities: weight maintenance, weight loss, and weight gain. The purpose of this lesson is to reinforce (in a group setting) the key concepts around what patients are recommended to eat and how to apply these guidelines when away from home by planning and selecting Pritikin-friendly options. Patients will understand how calorie density may be adjusted for different weight management goals.  Mindful Eating  Clinical staff led group instruction and group discussion with PowerPoint presentation and patient guidebook. To enhance the learning environment the use of posters, models and videos may be added. Patients will briefly review the concepts of the [place] and the importance of low-calorie dense foods. The concept of mindful eating will be introduced as well as the importance of paying attention to internal hunger signals. Triggers for non-hunger eating and techniques for dealing with triggers will be explored. The purpose of this lesson is to provide patients with the opportunity to review the basic principles of the [place], discuss the value of eating mindfully and how to measure internal cues of hunger and fullness using the Hunger Scale. Patients will also discuss reasons for non-hunger eating and learn strategies to use for controlling emotional  eating.  Targeting Your Nutrition Priorities Clinical staff led group instruction and group discussion with PowerPoint presentation and patient guidebook. To enhance the learning environment the use of posters, models and videos may be added. Patients will learn how to determine their genetic susceptibility to disease by reviewing their family history. Patients will gain insight into the importance of diet as part of an overall healthy lifestyle in mitigating the impact of genetics and other environmental insults. The purpose of this lesson is to provide patients with the opportunity to assess their personal nutrition priorities by looking at their family history, their own health history and current risk factors. Patients will also be able to discuss ways of prioritizing and modifying the [place] for their highest risk areas  Menu  Clinical staff led group instruction and group discussion with PowerPoint presentation and patient guidebook. To enhance the learning environment the use of posters, models and videos may be added. Using menus brought in from [employer], or printed from [employer], patients will apply the [place] dining out guidelines that were presented in the [employer] video. Patients will also be able to practice these guidelines in a variety of provided scenarios. The purpose of this lesson is to provide patients with the opportunity to practice hands-on learning of the [place] with actual menus and practice scenarios.  Label Reading Clinical staff led group instruction and group discussion with PowerPoint presentation and patient guidebook. To enhance the learning environment the use of posters, models and videos may be added. Patients will review and discuss the [facility] label reading guidelines presented in [facility]'s Label Reading Educational series video. Using fool labels brought in from local grocery stores and  markets, patients will apply the label reading guidelines and determine if the packaged food meet the [facility] guidelines. The purpose of this lesson is to provide patients with the opportunity to review, discuss, and practice hands-on learning of the [facility] Label Reading guidelines with actual packaged food labels. [place] Workshops are designed to teach patients ways to prepare quick, simple, and affordable recipes at home. The importance of nutrition's role in chronic disease risk reduction is reflected in its  emphasis in the overall [facility] program. By learning how to prepare essential core [facility] Eating Plan recipes, patients will increase control over what they eat; be able to customize the flavor of foods without the use of added salt, sugar, or fat; and improve the quality of the food they consume. By learning a set of core recipes which are easily assembled, quickly prepared, and affordable, patients are more likely to prepare more healthy foods at home. These workshops focus on convenient breakfasts, simple entres, side dishes, and desserts which can be prepared with minimal effort and are consistent with nutrition recommendations for cardiovascular risk reduction. Cooking [employer] are taught by a Engineer, materials (RD) who has been trained by the [employer]. The chef or RD has a clear understanding of the importance of minimizing - if not completely eliminating - added fat, sugar, and sodium in recipes. Throughout the series of [place] Workshop sessions, patients will learn about healthy ingredients and efficient methods of cooking to build confidence in their capability to prepare    Cooking School weekly topics:  Adding Flavor- Sodium-Free  Fast and Healthy Breakfasts  Powerhouse Plant-Based Proteins  Satisfying Salads and Dressings  Simple Sides and Sauces  International Cuisine-Spotlight on the [place]  Zones  Delicious Desserts  Savory Soups  [employer] - Meals in a Agricultural consultant Appetizers and Snacks  Comforting Weekend Breakfasts  One-Pot Wonders   Fast Evening Meals  Contractor Your [facility] Plate  WORKSHOPS   Healthy Mindset (Psychosocial):  Focused Goals, Sustainable Changes Clinical staff led group instruction and group discussion with PowerPoint presentation and patient guidebook. To enhance the learning environment the use of posters, models and videos may be added. Patients will be able to apply effective goal setting strategies to establish at least one personal goal, and then take consistent, meaningful action toward that goal. They will learn to identify common barriers to achieving personal goals and develop strategies to overcome them. Patients will also gain an understanding of how our mind-set can impact our ability to achieve goals and the importance of cultivating a positive and growth-oriented mind-set. The purpose of this lesson is to provide patients with a deeper understanding of how to set and achieve personal goals, as well as the tools and strategies needed to overcome common obstacles which may arise along the way.  From Head to Heart: The Power of a Healthy Outlook  Clinical staff led group instruction and group discussion with PowerPoint presentation and patient guidebook. To enhance the learning environment the use of posters, models and videos may be added. Patients will be able to recognize and describe the impact of emotions and mood on physical health. They will discover the importance of self-care and explore self-care practices which may work for them. Patients will also learn how to utilize the 4 C's to cultivate a healthier outlook and better manage stress and challenges. The purpose of this lesson is to demonstrate to patients how a healthy outlook is an essential part of maintaining good health, especially as they continue their  cardiac rehab journey.  Healthy Sleep for a Healthy Heart Clinical staff led group instruction and group discussion with PowerPoint presentation and patient guidebook. To enhance the learning environment the use of posters, models and videos may be added. At the conclusion of this workshop, patients will be able to demonstrate knowledge of the importance of sleep to overall health, well-being, and quality of life. They will understand the  symptoms of, and treatments for, common sleep disorders. Patients will also be able to identify daytime and nighttime behaviors which impact sleep, and they will be able to apply these tools to help manage sleep-related challenges. The purpose of this lesson is to provide patients with a general overview of sleep and outline the importance of quality sleep. Patients will learn about a few of the most common sleep disorders. Patients will also be introduced to the concept of "sleep hygiene," and discover ways to self-manage certain sleeping problems through simple daily behavior changes. Finally, the workshop will motivate patients by clarifying the links between quality sleep and their goals of heart-healthy living.   Recognizing and Reducing Stress Clinical staff led group instruction and group discussion with PowerPoint presentation and patient guidebook. To enhance the learning environment the use of posters, models and videos may be added. At the conclusion of this workshop, patients will be able to understand the types of stress reactions, differentiate between acute and chronic stress, and recognize the impact that chronic stress has on their health. They will also be able to apply different coping mechanisms, such as reframing negative self-talk. Patients will have the opportunity to practice a variety of stress management techniques, such as deep abdominal breathing, progressive muscle relaxation, and/or guided imagery.  The purpose of this lesson is to educate  patients on the role of stress in their lives and to provide healthy techniques for coping with it.  Learning Barriers/Preferences:  Learning Barriers/Preferences - 06/09/22 1142       Learning Barriers/Preferences   Learning Barriers None    Learning Preferences Written Material             Education Topics:  Knowledge Questionnaire Score:  Knowledge Questionnaire Score - 06/09/22 1132       Knowledge Questionnaire Score   Pre Score 16/24             Core Components/Risk Factors/Patient Goals at Admission:  Personal Goals and Risk Factors at Admission - 06/09/22 0924       Core Components/Risk Factors/Patient Goals on Admission    Weight Management Yes;Weight Loss    Intervention Weight Management: Develop a combined nutrition and exercise program designed to reach desired caloric intake, while maintaining appropriate intake of nutrient and fiber, sodium and fats, and appropriate energy expenditure required for the weight goal.;Weight Management: Provide education and appropriate resources to help participant work on and attain dietary goals.    Admit Weight 168 lb 6.4 oz (76.4 kg)    Goal Weight: Short Term 163 lb 6.4 oz (74.1 kg)    Goal Weight: Long Term 158 lb 6.4 oz (71.8 kg)    Expected Outcomes Short Term: Continue to assess and modify interventions until short term weight is achieved;Long Term: Adherence to nutrition and physical activity/exercise program aimed toward attainment of established weight goal;Weight Loss: Understanding of general recommendations for a balanced deficit meal plan, which promotes 1-2 lb weight loss per week and includes a negative energy balance of [phone] kcal/d    Improve shortness of breath with ADL's Yes    Intervention Provide education, individualized exercise plan and daily activity instruction to help decrease symptoms of SOB with activities of daily living.    Expected Outcomes Short Term: Improve cardiorespiratory fitness to  achieve a reduction of symptoms when performing ADLs;Long Term: Be able to perform more ADLs without symptoms or delay the onset of symptoms    Hypertension Yes    Intervention Provide education on lifestyle  modifcations including regular physical activity/exercise, weight management, moderate sodium restriction and increased consumption of fresh fruit, vegetables, and low fat dairy, alcohol moderation, and smoking cessation.;Monitor prescription use compliance.    Expected Outcomes Short Term: Continued assessment and intervention until BP is < 140/76mm HG in hypertensive participants. < 130/6mm HG in hypertensive participants with diabetes, heart failure or chronic kidney disease.;Long Term: Maintenance of blood pressure at goal levels.    Lipids Yes    Intervention Provide education and support for participant on nutrition & aerobic/resistive exercise along with prescribed medications to achieve LDL 70mg , HDL >40mg .    Expected Outcomes Short Term: Participant states understanding of desired cholesterol values and is compliant with medications prescribed. Participant is following exercise prescription and nutrition guidelines.;Long Term: Cholesterol controlled with medications as prescribed, with individualized exercise RX and with personalized nutrition plan. Value goals: LDL < 70mg , HDL > 40 mg.    Personal Goal Other Yes    Personal Goal Be more aware of what she eats to help with weight loss. Know parameters for exercise.    Intervention Provide education and support for participant on nutrition and aerobic/resistive exercise to help achieve weight loss goals. Provide individualized exercise prescription with parameters that patient can follow.    Expected Outcomes Patient will be compliant with exercise and nutrition plan.             Core Components/Risk Factors/Patient Goals Review:   Goals and Risk Factor Review     Row Name 06/15/22 1646             Core Components/Risk  Factors/Patient Goals Review   Personal Goals Review Weight Management/Obesity;Stress;Lipids;Hypertension       Review Deasiah started intesice cardiac rehab on 06/15/22 and did well with exercise. Vital signs were stable       Expected Outcomes Sumire will continue to participte in intesive cardiac rehab for exercise, nutrition and lifestyle modifications                Core Components/Risk Factors/Patient Goals at Discharge (Final Review):   Goals and Risk Factor Review - 06/15/22 1646       Core Components/Risk Factors/Patient Goals Review   Personal Goals Review Weight Management/Obesity;Stress;Lipids;Hypertension    Review Adrian started intesice cardiac rehab on 06/15/22 and did well with exercise. Vital signs were stable    Expected Outcomes Martiana will continue to participte in intesive cardiac rehab for exercise, nutrition and lifestyle modifications             ITP Comments:  ITP Comments     Row Name 06/09/22 0906 06/15/22 1642         ITP Comments Medical Director- Dr. Fransico Him, MD. Introduction to the [facility] Education Program/Intensive Cardiac Rehab. Reviewed initial orientation packet with patient. 30 Day ITP Review. Jaeda started intensive cardiac rehab on 06/15/22 amd did well with exercise               Comments: See ITP comments. Greidis is off to a good start to exercise. Sherrica has lost 1 kg since starting cardiac rehab. Harrell Gave RN BSN

## 2022-07-01 ENCOUNTER — Encounter (HOSPITAL_COMMUNITY): Payer: Medicare Other

## 2022-07-01 ENCOUNTER — Encounter (HOSPITAL_COMMUNITY)
Admission: RE | Admit: 2022-07-01 | Discharge: 2022-07-01 | Disposition: A | Discharge status: Home / Self Care | Payer: Medicare Other | Source: Ambulatory Visit | Attending: Internal Medicine | Admitting: Internal Medicine

## 2022-07-01 DIAGNOSIS — Z85828 Personal history of other malignant neoplasm of skin: Secondary | ICD-10-CM | POA: Diagnosis not present

## 2022-07-01 DIAGNOSIS — D225 Melanocytic nevi of trunk: Secondary | ICD-10-CM | POA: Diagnosis not present

## 2022-07-01 DIAGNOSIS — L821 Other seborrheic keratosis: Secondary | ICD-10-CM | POA: Diagnosis not present

## 2022-07-01 DIAGNOSIS — D485 Neoplasm of uncertain behavior of skin: Secondary | ICD-10-CM | POA: Diagnosis not present

## 2022-07-01 DIAGNOSIS — Z952 Presence of prosthetic heart valve: Secondary | ICD-10-CM

## 2022-07-01 DIAGNOSIS — D4819 Other specified neoplasm of uncertain behavior of connective and other soft tissue: Secondary | ICD-10-CM | POA: Diagnosis not present

## 2022-07-01 DIAGNOSIS — L814 Other melanin hyperpigmentation: Secondary | ICD-10-CM | POA: Diagnosis not present

## 2022-07-01 DIAGNOSIS — L570 Actinic keratosis: Secondary | ICD-10-CM | POA: Diagnosis not present

## 2022-07-03 ENCOUNTER — Encounter (HOSPITAL_COMMUNITY): Payer: Medicare Other

## 2022-07-03 ENCOUNTER — Encounter (HOSPITAL_COMMUNITY)
Admission: RE | Admit: 2022-07-03 | Discharge: 2022-07-03 | Disposition: A | Discharge status: Home / Self Care | Payer: Medicare Other | Source: Ambulatory Visit | Attending: Internal Medicine | Admitting: Internal Medicine

## 2022-07-03 DIAGNOSIS — Z952 Presence of prosthetic heart valve: Secondary | ICD-10-CM | POA: Diagnosis not present

## 2022-07-06 ENCOUNTER — Encounter (HOSPITAL_COMMUNITY): Payer: Medicare Other

## 2022-07-06 ENCOUNTER — Encounter (HOSPITAL_COMMUNITY)
Admission: RE | Admit: 2022-07-06 | Discharge: 2022-07-06 | Disposition: A | Discharge status: Home / Self Care | Payer: Medicare Other | Source: Ambulatory Visit | Attending: Internal Medicine | Admitting: Internal Medicine

## 2022-07-06 DIAGNOSIS — Z952 Presence of prosthetic heart valve: Secondary | ICD-10-CM | POA: Diagnosis not present

## 2022-07-06 DIAGNOSIS — Z853 Personal history of malignant neoplasm of breast: Secondary | ICD-10-CM | POA: Diagnosis not present

## 2022-07-07 ENCOUNTER — Telehealth: Payer: Self-pay

## 2022-07-07 NOTE — Telephone Encounter (Signed)
Called pt to discuss MM/US results from [facility], which recommends bx for suspicious area. Pt states her son is Dr Adriana Mccallum, who spoke with Dr Donne Hazel, who reviewed scans with radiologist and stated pt will not need bx afterall. She is going to f/u with Dr Donne Hazel next week and will call us with any further concerns.

## 2022-07-08 ENCOUNTER — Encounter (HOSPITAL_COMMUNITY)
Admission: RE | Admit: 2022-07-08 | Discharge: 2022-07-08 | Disposition: A | Discharge status: Home / Self Care | Payer: Medicare Other | Source: Ambulatory Visit | Attending: Internal Medicine | Admitting: Internal Medicine

## 2022-07-08 ENCOUNTER — Encounter (HOSPITAL_COMMUNITY): Payer: Medicare Other

## 2022-07-08 DIAGNOSIS — Z952 Presence of prosthetic heart valve: Secondary | ICD-10-CM | POA: Diagnosis not present

## 2022-07-10 ENCOUNTER — Encounter (HOSPITAL_COMMUNITY): Payer: Medicare Other

## 2022-07-10 ENCOUNTER — Encounter (HOSPITAL_COMMUNITY)
Admission: RE | Admit: 2022-07-10 | Discharge: 2022-07-10 | Disposition: A | Discharge status: Home / Self Care | Payer: Medicare Other | Source: Ambulatory Visit | Attending: Internal Medicine | Admitting: Internal Medicine

## 2022-07-10 DIAGNOSIS — Z952 Presence of prosthetic heart valve: Secondary | ICD-10-CM | POA: Diagnosis not present

## 2022-07-10 NOTE — Progress Notes (Signed)
CARDIAC REHAB PHASE 2  Reviewed home exercise with pt today. Pt is tolerating exercise well. Pt will continue to exercise on her own by walking her dog and going to her personal trainer for 30-45 minutes per session 2-3 days a week in addition to the 3 days in CRP2. Advised pt on THRR, RPE scale, hydration and temperature/humidity precautions. Reinforced NTG use, S/S to stop exercise and when to call MD vs 911. Encouraged warm up cool down and stretches with exercise sessions. Pt verbalized understanding, all questions were answered and pt was given a copy to take home.    Kirby Funk ACSM-CEP 07/10/2022 8:28 AM

## 2022-07-13 ENCOUNTER — Encounter (HOSPITAL_COMMUNITY): Payer: Medicare Other

## 2022-07-13 ENCOUNTER — Encounter (HOSPITAL_COMMUNITY)
Admission: RE | Admit: 2022-07-13 | Discharge: 2022-07-13 | Disposition: A | Discharge status: Home / Self Care | Payer: Medicare Other | Source: Ambulatory Visit | Attending: Internal Medicine | Admitting: Internal Medicine

## 2022-07-13 DIAGNOSIS — I71012 Dissection of descending thoracic aorta: Secondary | ICD-10-CM | POA: Diagnosis not present

## 2022-07-13 DIAGNOSIS — M353 Polymyalgia rheumatica: Secondary | ICD-10-CM | POA: Insufficient documentation

## 2022-07-13 DIAGNOSIS — I2510 Atherosclerotic heart disease of native coronary artery without angina pectoris: Secondary | ICD-10-CM | POA: Diagnosis not present

## 2022-07-13 DIAGNOSIS — K219 Gastro-esophageal reflux disease without esophagitis: Secondary | ICD-10-CM | POA: Diagnosis not present

## 2022-07-13 DIAGNOSIS — Z48812 Encounter for surgical aftercare following surgery on the circulatory system: Secondary | ICD-10-CM | POA: Insufficient documentation

## 2022-07-13 DIAGNOSIS — Z923 Personal history of irradiation: Secondary | ICD-10-CM | POA: Diagnosis not present

## 2022-07-13 DIAGNOSIS — Z79899 Other long term (current) drug therapy: Secondary | ICD-10-CM | POA: Diagnosis not present

## 2022-07-13 DIAGNOSIS — I5032 Chronic diastolic (congestive) heart failure: Secondary | ICD-10-CM | POA: Diagnosis not present

## 2022-07-13 DIAGNOSIS — E039 Hypothyroidism, unspecified: Secondary | ICD-10-CM | POA: Diagnosis not present

## 2022-07-13 DIAGNOSIS — Z853 Personal history of malignant neoplasm of breast: Secondary | ICD-10-CM | POA: Insufficient documentation

## 2022-07-13 DIAGNOSIS — Z8249 Family history of ischemic heart disease and other diseases of the circulatory system: Secondary | ICD-10-CM | POA: Insufficient documentation

## 2022-07-13 DIAGNOSIS — Z803 Family history of malignant neoplasm of breast: Secondary | ICD-10-CM | POA: Diagnosis not present

## 2022-07-13 DIAGNOSIS — I480 Paroxysmal atrial fibrillation: Secondary | ICD-10-CM | POA: Insufficient documentation

## 2022-07-13 DIAGNOSIS — Z7901 Long term (current) use of anticoagulants: Secondary | ICD-10-CM | POA: Diagnosis not present

## 2022-07-13 DIAGNOSIS — Z952 Presence of prosthetic heart valve: Secondary | ICD-10-CM | POA: Diagnosis not present

## 2022-07-13 DIAGNOSIS — I110 Hypertensive heart disease with heart failure: Secondary | ICD-10-CM | POA: Diagnosis not present

## 2022-07-15 ENCOUNTER — Encounter: Payer: Self-pay | Admitting: Adult Health

## 2022-07-15 ENCOUNTER — Encounter (HOSPITAL_COMMUNITY)
Admission: RE | Admit: 2022-07-15 | Discharge: 2022-07-15 | Disposition: A | Discharge status: Home / Self Care | Payer: Medicare Other | Source: Ambulatory Visit | Attending: Internal Medicine | Admitting: Internal Medicine

## 2022-07-15 ENCOUNTER — Encounter (HOSPITAL_COMMUNITY): Payer: Medicare Other

## 2022-07-15 DIAGNOSIS — Z48812 Encounter for surgical aftercare following surgery on the circulatory system: Secondary | ICD-10-CM | POA: Diagnosis not present

## 2022-07-15 DIAGNOSIS — I5032 Chronic diastolic (congestive) heart failure: Secondary | ICD-10-CM | POA: Diagnosis not present

## 2022-07-15 DIAGNOSIS — Z79899 Other long term (current) drug therapy: Secondary | ICD-10-CM | POA: Diagnosis not present

## 2022-07-15 DIAGNOSIS — Z952 Presence of prosthetic heart valve: Secondary | ICD-10-CM

## 2022-07-15 DIAGNOSIS — I110 Hypertensive heart disease with heart failure: Secondary | ICD-10-CM | POA: Diagnosis not present

## 2022-07-15 DIAGNOSIS — I480 Paroxysmal atrial fibrillation: Secondary | ICD-10-CM | POA: Diagnosis not present

## 2022-07-16 DIAGNOSIS — C50512 Malignant neoplasm of lower-outer quadrant of left female breast: Secondary | ICD-10-CM | POA: Diagnosis not present

## 2022-07-16 DIAGNOSIS — Z170 Estrogen receptor positive status [ER+]: Secondary | ICD-10-CM | POA: Diagnosis not present

## 2022-07-17 ENCOUNTER — Encounter (HOSPITAL_COMMUNITY)
Admission: RE | Admit: 2022-07-17 | Discharge: 2022-07-17 | Disposition: A | Discharge status: Home / Self Care | Payer: Medicare Other | Source: Ambulatory Visit | Attending: Internal Medicine | Admitting: Internal Medicine

## 2022-07-17 ENCOUNTER — Encounter (HOSPITAL_COMMUNITY): Payer: Medicare Other

## 2022-07-17 DIAGNOSIS — I110 Hypertensive heart disease with heart failure: Secondary | ICD-10-CM | POA: Diagnosis not present

## 2022-07-17 DIAGNOSIS — I5032 Chronic diastolic (congestive) heart failure: Secondary | ICD-10-CM | POA: Diagnosis not present

## 2022-07-17 DIAGNOSIS — I480 Paroxysmal atrial fibrillation: Secondary | ICD-10-CM | POA: Diagnosis not present

## 2022-07-17 DIAGNOSIS — Z79899 Other long term (current) drug therapy: Secondary | ICD-10-CM | POA: Diagnosis not present

## 2022-07-17 DIAGNOSIS — Z952 Presence of prosthetic heart valve: Secondary | ICD-10-CM | POA: Diagnosis not present

## 2022-07-17 DIAGNOSIS — Z48812 Encounter for surgical aftercare following surgery on the circulatory system: Secondary | ICD-10-CM | POA: Diagnosis not present

## 2022-07-20 ENCOUNTER — Encounter (HOSPITAL_COMMUNITY): Payer: Medicare Other

## 2022-07-20 ENCOUNTER — Other Ambulatory Visit: Payer: Self-pay | Admitting: *Deleted

## 2022-07-20 ENCOUNTER — Encounter (HOSPITAL_COMMUNITY)
Admission: RE | Admit: 2022-07-20 | Discharge: 2022-07-20 | Disposition: A | Discharge status: Home / Self Care | Payer: Medicare Other | Source: Ambulatory Visit | Attending: Internal Medicine | Admitting: Internal Medicine

## 2022-07-20 DIAGNOSIS — I480 Paroxysmal atrial fibrillation: Secondary | ICD-10-CM | POA: Diagnosis not present

## 2022-07-20 DIAGNOSIS — Z952 Presence of prosthetic heart valve: Secondary | ICD-10-CM | POA: Diagnosis not present

## 2022-07-20 DIAGNOSIS — Z48812 Encounter for surgical aftercare following surgery on the circulatory system: Secondary | ICD-10-CM | POA: Diagnosis not present

## 2022-07-20 DIAGNOSIS — I110 Hypertensive heart disease with heart failure: Secondary | ICD-10-CM | POA: Diagnosis not present

## 2022-07-20 DIAGNOSIS — Z79899 Other long term (current) drug therapy: Secondary | ICD-10-CM | POA: Diagnosis not present

## 2022-07-20 DIAGNOSIS — I5032 Chronic diastolic (congestive) heart failure: Secondary | ICD-10-CM | POA: Diagnosis not present

## 2022-07-20 MED ORDER — METOPROLOL SUCCINATE ER 25 MG PO TB24: 25.0000 mg | ORAL_TABLET | Freq: Every day | ORAL | 3 refills | Status: DC

## 2022-07-22 ENCOUNTER — Encounter (HOSPITAL_COMMUNITY): Payer: Medicare Other

## 2022-07-22 ENCOUNTER — Encounter (HOSPITAL_COMMUNITY)
Admission: RE | Admit: 2022-07-22 | Discharge: 2022-07-22 | Disposition: A | Discharge status: Home / Self Care | Payer: Medicare Other | Source: Ambulatory Visit | Attending: Internal Medicine | Admitting: Internal Medicine

## 2022-07-22 ENCOUNTER — Encounter: Payer: Self-pay | Admitting: Adult Health

## 2022-07-22 DIAGNOSIS — Z952 Presence of prosthetic heart valve: Secondary | ICD-10-CM | POA: Diagnosis not present

## 2022-07-22 DIAGNOSIS — I480 Paroxysmal atrial fibrillation: Secondary | ICD-10-CM | POA: Diagnosis not present

## 2022-07-22 DIAGNOSIS — Z79899 Other long term (current) drug therapy: Secondary | ICD-10-CM | POA: Diagnosis not present

## 2022-07-22 DIAGNOSIS — Z48812 Encounter for surgical aftercare following surgery on the circulatory system: Secondary | ICD-10-CM | POA: Diagnosis not present

## 2022-07-22 DIAGNOSIS — I5032 Chronic diastolic (congestive) heart failure: Secondary | ICD-10-CM | POA: Diagnosis not present

## 2022-07-22 DIAGNOSIS — I110 Hypertensive heart disease with heart failure: Secondary | ICD-10-CM | POA: Diagnosis not present

## 2022-07-24 ENCOUNTER — Encounter (HOSPITAL_COMMUNITY)
Admission: RE | Admit: 2022-07-24 | Discharge: 2022-07-24 | Disposition: A | Discharge status: Home / Self Care | Payer: Medicare Other | Source: Ambulatory Visit | Attending: Internal Medicine | Admitting: Internal Medicine

## 2022-07-24 ENCOUNTER — Encounter (HOSPITAL_COMMUNITY): Payer: Medicare Other

## 2022-07-24 DIAGNOSIS — Z952 Presence of prosthetic heart valve: Secondary | ICD-10-CM | POA: Diagnosis not present

## 2022-07-24 DIAGNOSIS — I480 Paroxysmal atrial fibrillation: Secondary | ICD-10-CM | POA: Diagnosis not present

## 2022-07-24 DIAGNOSIS — I110 Hypertensive heart disease with heart failure: Secondary | ICD-10-CM | POA: Diagnosis not present

## 2022-07-24 DIAGNOSIS — I5032 Chronic diastolic (congestive) heart failure: Secondary | ICD-10-CM | POA: Diagnosis not present

## 2022-07-24 DIAGNOSIS — Z48812 Encounter for surgical aftercare following surgery on the circulatory system: Secondary | ICD-10-CM | POA: Diagnosis not present

## 2022-07-24 DIAGNOSIS — Z79899 Other long term (current) drug therapy: Secondary | ICD-10-CM | POA: Diagnosis not present

## 2022-07-27 ENCOUNTER — Encounter (HOSPITAL_COMMUNITY): Payer: Medicare Other

## 2022-07-27 ENCOUNTER — Encounter (HOSPITAL_COMMUNITY)
Admission: RE | Admit: 2022-07-27 | Discharge: 2022-07-27 | Disposition: A | Discharge status: Home / Self Care | Payer: Medicare Other | Source: Ambulatory Visit | Attending: Internal Medicine | Admitting: Internal Medicine

## 2022-07-27 DIAGNOSIS — I480 Paroxysmal atrial fibrillation: Secondary | ICD-10-CM | POA: Diagnosis not present

## 2022-07-27 DIAGNOSIS — Z48812 Encounter for surgical aftercare following surgery on the circulatory system: Secondary | ICD-10-CM | POA: Diagnosis not present

## 2022-07-27 DIAGNOSIS — I110 Hypertensive heart disease with heart failure: Secondary | ICD-10-CM | POA: Diagnosis not present

## 2022-07-27 DIAGNOSIS — Z79899 Other long term (current) drug therapy: Secondary | ICD-10-CM | POA: Diagnosis not present

## 2022-07-27 DIAGNOSIS — Z952 Presence of prosthetic heart valve: Secondary | ICD-10-CM

## 2022-07-27 DIAGNOSIS — I5032 Chronic diastolic (congestive) heart failure: Secondary | ICD-10-CM | POA: Diagnosis not present

## 2022-07-27 NOTE — Progress Notes (Signed)
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 Take on Mondays, Wednesdays, and Fridays., Disp: , Rfl:    apixaban (ELIQUIS) 5 MG TABS tablet, Take 1 tablet (5 mg total) by mouth 2 (two) times daily., Disp: 60 tablet, Rfl: 11   b complex vitamins capsule, Take 1 capsule by mouth daily., Disp: , Rfl:    Calcium Carb-Cholecalciferol (CALCIUM + D3 PO), Take 1 tablet by mouth in the morning., Disp: , Rfl:    cholecalciferol (VITAMIN D3) 25 MCG (1000 UNIT) tablet, Take 1,000 Units by mouth in the morning., Disp: , Rfl:    dapagliflozin propanediol (FARXIGA) 10 MG TABS tablet, Take 1 tablet (10 mg total) by mouth daily before breakfast., Disp: 30 tablet, Rfl: 11   denosumab (PROLIA) 60 MG/ML SOSY injection, Inject 60 mg into the skin every 6 (six) months., Disp: , Rfl:    dicyclomine (BENTYL) 10 MG capsule, Take 10-20 mg by mouth 3 (three) times daily as needed for spasms., Disp: , Rfl:    folic acid (FOLVITE) 1 MG tablet, Take 2 mg by mouth in the morning., Disp: , Rfl:    letrozole (FEMARA) 2.5 MG tablet, TAKE 1 TABLET(2.5 MG) BY MOUTH DAILY, Disp: 90 tablet, Rfl: 3    levothyroxine (SYNTHROID) 100 MCG tablet, Take 100 mcg by mouth daily before breakfast., Disp: , Rfl:    loperamide (IMODIUM) 2 MG capsule, Take 2-4 mg by mouth 4 (four) times daily as needed (upset stomach/diarrhea/loose stools)., Disp: , Rfl:    metoprolol succinate (TOPROL XL) 25 MG 24 hr tablet, Take 1 tablet (25 mg total) by mouth daily., Disp: 90 tablet, Rfl: 3   mometasone (ELOCON) 0.1 % ointment, Apply topically to vulvar skin no more than twice weekly, Disp: 45 g, Rfl: 1   nitroGLYCERIN (NITROSTAT) 0.4 MG SL tablet, Place 1 tablet (0.4 mg total) under the tongue every 5 (five) minutes as needed for chest pain., Disp: 25 tablet, Rfl: 3   nystatin cream (MYCOSTATIN), Apply 1 Application topically as needed for dry skin., Disp: , Rfl:    REPATHA SURECLICK [id number] MG/ML SOAJ, Inject 140 mg into the skin every 14 (fourteen) days., Disp: , Rfl:    sacubitril-valsartan (ENTRESTO) 49-51 MG, Take 1 tablet by mouth 2 (two) times daily., Disp: 60 tablet, Rfl: 3   traMADol (ULTRAM) 50 MG tablet, Take 1 tablet (50 mg total) by mouth every 6 (six) hours as needed., Disp: 10 tablet, Rfl: 0   zolpidem (AMBIEN) 5 MG tablet, Take 2.5 mg by mouth at bedtime., Disp: , Rfl:   Past Medical History: Past Medical History:  Diagnosis Date   Arthritis    osteoarthritis  Breast cancer ([place]) 11/2021   CAD (coronary artery disease)    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 Angelena Form & Dr. Cyndia Bent .   Severe aortic stenosis     Tobacco Use: Social History   Tobacco Use  Smoking Status Never   Passive exposure: Never  Smokeless Tobacco Never    Labs: Review Flowsheet  More data exists      Latest Ref Rng & Units 09/22/2018 10/28/2018 06/19/2021 07/11/2021 08/19/2021  Labs for ITP Cardiac and Pulmonary Rehab  Cholestrol 100 - 199 mg/dL 183  122  - - -  LDL (calc) 0 - 99 mg/dL 108  45  - - -  HDL-C >39 mg/dL 61  55  - - -  Trlycerides <150 mg/dL 71  108  - 100  -  PH, Arterial 7.35 - 7.45 - - 7.457  - -  PCO2 arterial 32 - 48 mmHg - - 33.4  - -  Bicarbonate 20.0 - 28.0 mmol/L 20.0 - 28.0 mmol/L - - 24.5  24.6  23.6  - -  TCO2 22 - 32 mmol/L - - 26  26  25   - 24  22   Acid-base deficit 0.0 - 2.0 mmol/L 0.0 - 2.0 mmol/L - - 1.0  1.0  - -  O2 Saturation % % - - 74  75  99  - -    Capillary Blood Glucose: No results found for: "GLUCAP"   Exercise Target Goals: Exercise Program Goal: Individual exercise prescription set using results from initial 6 min walk test and THRR while considering  patient's activity barriers and safety.   Exercise Prescription Goal: Initial exercise prescription builds to 30-45 minutes a day of aerobic activity, 2-3 days per week.  Home exercise guidelines will be given to patient during program as part of exercise prescription that the participant will acknowledge.  Activity Barriers & Risk Stratification:  Activity Barriers & Cardiac Risk Stratification - 06/09/22 0924       Activity Barriers & Cardiac Risk Stratification   Activity Barriers Arthritis;Left Knee Replacement;Right Knee Replacement;Other (comment)    Comments Polymyalgia rheumatica    Cardiac Risk Stratification High             6 Minute Walk:  6 Minute Walk     Row Name 06/09/22 1015         6 Minute Walk   Phase Initial     Distance 1523 feet     Walk Time 6 minutes     # of Rest Breaks 0     MPH 2.88     METS 2.81     RPE 11      Perceived Dyspnea  1     VO2 Peak 9.85     Symptoms Yes (comment)     Comments Mild SOB,  RPD=1.     Resting HR 57 bpm     Resting BP 138/88     Resting Oxygen Saturation  97 %     Exercise Oxygen Saturation  during 6 min walk 95 %     Max Ex. HR 88 bpm     Max Ex. BP 168/92     2 Minute Post BP 142/88              Oxygen Initial Assessment:   Oxygen Re-Evaluation:   Oxygen Discharge (Final Oxygen Re-Evaluation):   Initial Exercise Prescription:  Initial Exercise Prescription - 06/09/22 1100       Date of Initial Exercise RX and Referring Provider   Date 06/09/22    Referring Provider Dolores Patty, MD    Expected Discharge Date 08/21/22      NuStep   Level 2    SPM 85    Minutes 15    METs 2.5      Recumbant Elliptical   Level 1    Minutes 15    METs 2.8      Prescription Details   Frequency (times per week) 3    Duration Progress to 30 minutes of continuous aerobic without signs/symptoms of physical distress      Intensity   THRR 40-80% of Max Heartrate 56-111    Ratings of Perceived Exertion 11-13    Perceived Dyspnea 0-4      Progression   Progression Continue to progress workloads to maintain intensity without signs/symptoms of physical distress.      Resistance Training   Training Prescription Yes    Weight 2 lbs    Reps 10-15             Perform Capillary Blood Glucose checks as needed.  Exercise Prescription Changes:   Exercise Prescription Changes     Row Name 06/15/22 [phone] 06/26/22 0832 07/10/22 0851         Response to Exercise   Blood Pressure (Admit) 122/68 124/74 136/70     Blood Pressure (Exercise) 152/82 146/88 150/70     Blood Pressure (Exit) 114/64 104/58 124/66     Heart Rate (Admit) 66 bpm 69 bpm 71 bpm     Heart Rate (Exercise) 90 bpm 106 bpm 91 bpm     Heart Rate (Exit) 73 bpm 73 bpm 69 bpm     Rating of Perceived Exertion (Exercise) 12.5 12.5 11.5     Perceived Dyspnea (Exercise) 0 0 0     Symptoms 0  0 0     Comments Pt first day in the [facility] program Reviewed MET's and goals Reviewed MET's, goals and Home ExRx     Duration Progress to 30 minutes of  aerobic without signs/symptoms of physical distress Progress to 30 minutes of  aerobic without signs/symptoms of physical distress Progress to 30 minutes of  aerobic without signs/symptoms of physical distress     Intensity THRR unchanged THRR unchanged THRR unchanged       Progression   Progression Continue to progress workloads to maintain intensity without signs/symptoms of physical distress. Continue to progress workloads to maintain intensity without signs/symptoms of physical distress. Continue to progress workloads to maintain intensity without signs/symptoms of physical distress.     Average METs 2.7 3.9 3.8       Resistance Training   Training Prescription Yes Yes Yes     Weight 2 lbs 2 lbs 2 lbs     Reps 10-15 10-15 10-15  Time 10 Minutes 10 Minutes 10 Minutes       NuStep   Level 2 2 2      SPM 77 66 101     Minutes 15 15 15      METs 2.3 2.8 2.8       Recumbant Elliptical   Level 1 1 1      RPM -- 66 64     Watts -- 96 97     Minutes 15 15 15      METs 3.1 5 4.8       Home Exercise Plan   Plans to continue exercise at -- -- Home (comment)     Frequency -- -- Add 3 additional days to program exercise sessions.     Initial Home Exercises Provided -- -- 07/10/22              Exercise Comments:   Exercise Comments     Row Name 06/15/22 [phone] 06/26/22 0839 07/10/22 0827       Exercise Comments Pt first day in the [facility] program. Pt tolerated exercise well with an average MET level of 2.7. Pt is learning her THRR, RPE and ExRx. Off to a great start. Reviewed MET's and goals with pt today. Pt tolerated exercise well with an average MET level of 3.9. Pt is having continued concerns with increased fatigue, evening GERD symptoms and SOB with exercise, referred to nurse. Did not review home exercise due to  symptoms and fatigue with current exercise plan. Reviewed MET's, goals and home ExRx. Pt tolerated exercise well with an average MET level of 3.8. Pt is feeling stonger and states she has more energy on the days when she exercises. Still working on eating better and her wt loss, but she feels she has the resources she needs and is Administrator, arts better choices. Pt is seeing a personal trainer 1 day a week and walking her dog for long walks 1 day a week. encouraged one extra day of a long walk with her dog so that she gets 3 days on her own of 30-45 mins.              Exercise Goals and Review:   Exercise Goals     Row Name 06/09/22 2536             Exercise Goals   Increase Physical Activity Yes       Intervention Provide advice, education, support and counseling about physical activity/exercise needs.;Develop an individualized exercise prescription for aerobic and resistive training based on initial evaluation findings, risk stratification, comorbidities and participant's personal goals.       Expected Outcomes Short Term: Attend rehab on a regular basis to increase amount of physical activity.;Long Term: Add in home exercise to make exercise part of routine and to increase amount of physical activity.;Long Term: Exercising regularly at least 3-5 days a week.       Increase Strength and Stamina Yes       Intervention Provide advice, education, support and counseling about physical activity/exercise needs.;Develop an individualized exercise prescription for aerobic and resistive training based on initial evaluation findings, risk stratification, comorbidities and participant's personal goals.       Expected Outcomes Short Term: Increase workloads from initial exercise prescription for resistance, speed, and METs.;Short Term: Perform resistance training exercises routinely during rehab and add in resistance training at home;Long Term: Improve cardiorespiratory fitness, muscular endurance and strength  as measured by increased METs and functional capacity ( )  Able to understand and use rate of perceived exertion (RPE) scale Yes       Intervention Provide education and explanation on how to use RPE scale       Expected Outcomes Short Term: Able to use RPE daily in rehab to express subjective intensity level;Long Term:  Able to use RPE to guide intensity level when exercising independently       Knowledge and understanding of Target Heart Rate Range (THRR) Yes       Intervention Provide education and explanation of THRR including how the numbers were predicted and where they are located for reference       Expected Outcomes Short Term: Able to state/look up THRR;Long Term: Able to use THRR to govern intensity when exercising independently;Short Term: Able to use daily as guideline for intensity in rehab       Able to check pulse independently Yes       Intervention Provide education and demonstration on how to check pulse in carotid and radial arteries.;Review the importance of being able to check your own pulse for safety during independent exercise       Expected Outcomes Long Term: Able to check pulse independently and accurately;Short Term: Able to explain why pulse checking is important during independent exercise       Understanding of Exercise Prescription Yes       Intervention Provide education, explanation, and written materials on patient's individual exercise prescription       Expected Outcomes Short Term: Able to explain program exercise prescription;Long Term: Able to explain home exercise prescription to exercise independently                Exercise Goals Re-Evaluation :  Exercise Goals Re-Evaluation     Row Name 06/15/22 1610 06/26/22 0834 07/10/22 0821         Exercise Goal Re-Evaluation   Exercise Goals Review Increase Physical Activity;Understanding of Exercise Prescription;Increase Strength and Stamina;Knowledge and understanding of Target Heart Rate Range  (THRR);Able to understand and use rate of perceived exertion (RPE) scale Increase Physical Activity;Understanding of Exercise Prescription;Increase Strength and Stamina;Knowledge and understanding of Target Heart Rate Range (THRR);Able to understand and use rate of perceived exertion (RPE) scale Increase Physical Activity;Understanding of Exercise Prescription;Increase Strength and Stamina;Knowledge and understanding of Target Heart Rate Range (THRR);Able to understand and use rate of perceived exertion (RPE) scale     Comments Pt first day in the [facility] program. Pt tolerated exercise well with an average MET level of 2.7. Pt is learning her THRR, RPE and ExRx Reviewed MET's and goals with pt today. Pt tolerated exercise well with an average MET level of 3.9. Pt is having continued concerns with increased fatigue, evening GERD symptoms and SOB with exercise, referred to nurse. Did not review home exercise due to symptoms and fatigue with current exercise plan. Reviewed MET's, goals and home ExRx. Pt tolerated exercise well with an average MET level of 3.8. Pt is feeling stonger and states she has more energy on the days when she exercises. Still working on eating better and her wt loss, but she feels she has the resources she needs and is Administrator, arts better choices. Pt is seeing a personal trainer 1 day a week and walking her dog for long walks 1 day a week. encouraged one extra day of a long walk with her dog so that she gets 3 days on her own of 30-45 mins.     Expected Outcomes Will continue to monitor  pt and progress workloads as tolerated without sign or symptom Will continue to monitor pt and progress workloads as tolerated without sign or symptom Will continue to monitor pt and progress workloads as tolerated without sign or symptom              Discharge Exercise Prescription (Final Exercise Prescription Changes):  Exercise Prescription Changes - 07/10/22 0851       Response to Exercise    Blood Pressure (Admit) 136/70    Blood Pressure (Exercise) 150/70    Blood Pressure (Exit) 124/66    Heart Rate (Admit) 71 bpm    Heart Rate (Exercise) 91 bpm    Heart Rate (Exit) 69 bpm    Rating of Perceived Exertion (Exercise) 11.5    Perceived Dyspnea (Exercise) 0    Symptoms 0    Comments Reviewed MET's, goals and Home ExRx    Duration Progress to 30 minutes of  aerobic without signs/symptoms of physical distress    Intensity THRR unchanged      Progression   Progression Continue to progress workloads to maintain intensity without signs/symptoms of physical distress.    Average METs 3.8      Resistance Training   Training Prescription Yes    Weight 2 lbs    Reps 10-15    Time 10 Minutes      NuStep   Level 2    SPM 101    Minutes 15    METs 2.8      Recumbant Elliptical   Level 1    RPM 64    Watts 97    Minutes 15    METs 4.8      Home Exercise Plan   Plans to continue exercise at Home (comment)    Frequency Add 3 additional days to program exercise sessions.    Initial Home Exercises Provided 07/10/22             Nutrition:  Target Goals: Understanding of nutrition guidelines, daily intake of sodium 1500mg , cholesterol 200mg , calories 30% from fat and 7% or less from saturated fats, daily to have 5 or more servings of fruits and vegetables.  Biometrics:  Pre Biometrics - 06/09/22 0906       Pre Biometrics   Waist Circumference 38 inches    Hip Circumference 41.75 inches    Waist to Hip Ratio 0.91 %    Triceps Skinfold 25 mm    % Body Fat 39.9 %    Grip Strength 23 kg    Flexibility 0 in    Single Leg Stand 30 seconds              Nutrition Therapy Plan and Nutrition Goals:  Nutrition Therapy & Goals - 07/15/22 0933       Nutrition Therapy   Diet Heart Healthy Diet      Personal Nutrition Goals   Nutrition Goal Patient to identify strategies for reducing cardiovascular risk by attending the weekly [facility] education and nutrition  series    Personal Goal #2 Patient to improve diet quality by using the plate method as a daily guide for meal planning to include lean protein/plant protein, fruits, vegetables, whole grains, nonfat dairy as part of well balanced diet    Personal Goal #3 Patient to reduce sodium intake to 1500mg  per day    Comments Goals in action. She continues to attend the [facility] education and nutrition series regularly. Shaunta lives alone and does infrequent cooking. Discussed and gave resources for  healthy convenience foods and simple recipes. Patient's most recent lipid panel (from 02/10/2022) show LDL not at goal of <70 (90); she is taking Repatha. Per documenation from Heart failure clinic on 05/06/2022, recommend weight loss of 10-15# to aid with DOE. She is down 5# since starting with our program. Rogina will continue to benefit from participation in intensive cardiac rehab for nutrition, exercise, and lifestyle modification.      Intervention Plan   Intervention Prescribe, educate and counsel regarding individualized specific dietary modifications aiming towards targeted core components such as weight, hypertension, lipid management, diabetes, heart failure and other comorbidities.;Nutrition handout(s) given to patient.    Expected Outcomes Short Term Goal: Understand basic principles of dietary content, such as calories, fat, sodium, cholesterol and nutrients.;Long Term Goal: Adherence to prescribed nutrition plan.             Nutrition Assessments:  Nutrition Assessments - 06/26/22 0927       Rate Your Plate Scores   Pre Score 63            MEDIFICTS Score Key: ?70 Need to make dietary changes  40-70 Heart Healthy Diet ? 40 Therapeutic Level Cholesterol Diet   Flowsheet Row INTENSIVE CARDIAC REHAB from 06/26/2022 in [facility]  Picture Your Plate Total Score on Admission 63      Picture Your Plate Scores: <16 Unhealthy  dietary pattern with much room for improvement. 41-50 Dietary pattern unlikely to meet recommendations for good health and room for improvement. 51-60 More healthful dietary pattern, with some room for improvement.  >60 Healthy dietary pattern, although there may be some specific behaviors that could be improved.    Nutrition Goals Re-Evaluation:  Nutrition Goals Re-Evaluation     Row Name 06/15/22 0954 07/15/22 0933           Goals   Current Weight 167 lb 5.3 oz (75.9 kg) 163 lb 2.3 oz (74 kg)      Comment A1c WNL, LDL 90 No new labs at this time; most recent labs A1c WNL, LDL 90      Expected Outcome Patient's most recent lipid panel (from 02/10/2022) show LDL not at goal of <70 (90); she is taking Repatha. Per documenation from Heart failure clinic on 05/06/2022, recommend weight loss of 10-15# to aid with DOE. Jenna will continue to benefit from participation in intensive cardiac rehab for nutrition, exercise, and lifestyle modification. Goals in action. She continues to attend the [facility] education and nutrition series regularly. Darnisha lives alone and does infrequent cooking. Discussed and gave resources for healthy convenience foods and simple recipes. Patient's most recent lipid panel (from 02/10/2022) show LDL not at goal of <70 (90); she is taking Repatha. Per documenation from Heart failure clinic on 05/06/2022, recommend weight loss of 10-15# to aid with DOE. She is down 5# since starting with our program. Anice will continue to benefit from participation in intensive cardiac rehab for nutrition, exercise, and lifestyle modification.               Nutrition Goals Re-Evaluation:  Nutrition Goals Re-Evaluation     Row Name 06/15/22 0954 07/15/22 0933           Goals   Current Weight 167 lb 5.3 oz (75.9 kg) 163 lb 2.3 oz (74 kg)      Comment A1c WNL, LDL 90 No new labs at this time; most recent labs A1c WNL, LDL 90  Expected Outcome Patient's most recent lipid  panel (from 02/10/2022) show LDL not at goal of <70 (90); she is taking Repatha. Per documenation from Heart failure clinic on 05/06/2022, recommend weight loss of 10-15# to aid with DOE. Zakyria will continue to benefit from participation in intensive cardiac rehab for nutrition, exercise, and lifestyle modification. Goals in action. She continues to attend the [facility] education and nutrition series regularly. Marye lives alone and does infrequent cooking. Discussed and gave resources for healthy convenience foods and simple recipes. Patient's most recent lipid panel (from 02/10/2022) show LDL not at goal of <70 (90); she is taking Repatha. Per documenation from Heart failure clinic on 05/06/2022, recommend weight loss of 10-15# to aid with DOE. She is down 5# since starting with our program. Tenasia will continue to benefit from participation in intensive cardiac rehab for nutrition, exercise, and lifestyle modification.               Nutrition Goals Discharge (Final Nutrition Goals Re-Evaluation):  Nutrition Goals Re-Evaluation - 07/15/22 0933       Goals   Current Weight 163 lb 2.3 oz (74 kg)    Comment No new labs at this time; most recent labs A1c WNL, LDL 90    Expected Outcome Goals in action. She continues to attend the [facility] education and nutrition series regularly. Renezmae lives alone and does infrequent cooking. Discussed and gave resources for healthy convenience foods and simple recipes. Patient's most recent lipid panel (from 02/10/2022) show LDL not at goal of <70 (90); she is taking Repatha. Per documenation from Heart failure clinic on 05/06/2022, recommend weight loss of 10-15# to aid with DOE. She is down 5# since starting with our program. Kissy will continue to benefit from participation in intensive cardiac rehab for nutrition, exercise, and lifestyle modification.             Psychosocial: Target Goals: Acknowledge presence or absence of significant depression  and/or stress, maximize coping skills, provide positive support system. Participant is able to verbalize types and ability to use techniques and skills needed for reducing stress and depression.  Initial Review & Psychosocial Screening:  Initial Psych Review & Screening - 06/09/22 1141       Initial Review   Current issues with None Identified      Family Dynamics   Good Support System? Yes    Comments Patient travels and does activities with friends.      Barriers   Psychosocial barriers to participate in program There are no identifiable barriers or psychosocial needs.             Quality of Life Scores:  Quality of Life - 06/09/22 1131       Quality of Life   Select Quality of Life      Quality of Life Scores   Health/Function Pre 25.15 %    Socioeconomic Pre 30 %    Psych/Spiritual Pre 28.79 %    Family Pre 28.5 %    GLOBAL Pre 27.42 %            Scores of 19 and below usually indicate a poorer quality of life in these areas.  A difference of  2-3 points is a clinically meaningful difference.  A difference of 2-3 points in the total score of the Quality of Life Index has been associated with significant improvement in overall quality of life, self-image, physical symptoms, and general health in studies assessing change in quality of life.  PHQ-9:  Review Flowsheet       06/09/2022 07/17/2021  Depression screen PHQ 2/9  Decreased Interest 0 0  Down, Depressed, Hopeless 0 1  PHQ - 2 Score 0 1  Altered sleeping 0 -  Tired, decreased energy 1 -  Change in appetite 0 -  Feeling bad or failure about yourself  0 -  Trouble concentrating 0 -  Moving slowly or fidgety/restless 0 -  Suicidal thoughts 0 -  PHQ-9 Score 1 -  Difficult doing work/chores Not difficult at all -   Interpretation of Total Score  Total Score Depression Severity:  1-4 = Minimal depression, 5-9 = Mild depression, 10-14 = Moderate depression, 15-19 = Moderately severe depression, 20-27 =  Severe depression   Psychosocial Evaluation and Intervention:   Psychosocial Re-Evaluation:  Psychosocial Re-Evaluation     Row Name 06/15/22 1646 07/27/22 0825           Psychosocial Re-Evaluation   Current issues with None Identified None Identified      Interventions Encouraged to attend Cardiac Rehabilitation for the exercise Encouraged to attend Cardiac Rehabilitation for the exercise      Continue Psychosocial Services  No Follow up required No Follow up required               Psychosocial Discharge (Final Psychosocial Re-Evaluation):  Psychosocial Re-Evaluation - 07/27/22 0825       Psychosocial Re-Evaluation   Current issues with None Identified    Interventions Encouraged to attend Cardiac Rehabilitation for the exercise    Continue Psychosocial Services  No Follow up required             Vocational Rehabilitation: Provide vocational rehab assistance to qualifying candidates.   Vocational Rehab Evaluation & Intervention:  Vocational Rehab - 06/09/22 1143       Initial Vocational Rehab Evaluation & Intervention   Assessment shows need for Vocational Rehabilitation No      Vocational Rehab Re-Evaulation   Comments Patient is retired.             Education: Education Goals: Education classes will be provided on a weekly basis, covering required topics. Participant will state understanding/return demonstration of topics presented.    Education     Row Name 06/15/22 0900     Education   Cardiac Education Topics Pritikin   Select Workshops     Workshops   Educator Exercise Physiologist   Select Psychosocial   Psychosocial Workshop Other  From [employer] to [employer] of a Healthy Outlook   Instruction Review Code 1- Verbalizes Understanding   Class Start Time [phone]   Class Stop Time 0856   Class Time Calculation (min) 44 min    Row Name 06/17/22 1000     Education   Cardiac Education Topics Pritikin   Secondary school teacher  School   Educator Dietitian   Weekly Topic Adding Flavor - Sodium-Free   Instruction Review Code 1- Verbalizes Understanding   Class Start Time 0815   Class Stop Time 0858   Class Time Calculation (min) 43 min    Row Name 06/19/22 1400     Education   Cardiac Education Topics Pritikin   Psychologist, forensic General Education   General Education Heart Disease Risk Reduction   Instruction Review Code 1- Verbalizes Understanding   Class Start Time 0815   Class Stop Time [phone]   Class Time  Calculation (min) 37 min    Row Name 06/22/22 1200     Education   Cardiac Education Topics Pritikin   Select Workshops     Workshops   Educator Exercise Physiologist   Select Exercise   Exercise Workshop Location manager and Fall Prevention   Instruction Review Code 1- Verbalizes Understanding   Class Start Time 0815   Class Stop Time 0855   Class Time Calculation (min) 40 min    Row Name 06/24/22 1000     Education   Cardiac Education Topics Pritikin   Orthoptist   Educator Dietitian   Weekly Topic Fast and Healthy Breakfasts   Instruction Review Code 1- Verbalizes Understanding   Class Start Time 0813   Class Stop Time 0856   Class Time Calculation (min) 43 min    Row Name 06/26/22 0900     Education   Cardiac Education Topics Pritikin   Select Core Videos     Core Videos   Educator Dietitian   Select Nutrition   Nutrition Overview of the Pritikin Eating Plan   Instruction Review Code 1- Verbalizes Understanding   Class Start Time 0815   Class Stop Time 0905   Class Time Calculation (min) 50 min    Row Name 06/29/22 0900     Education   Cardiac Education Topics Pritikin   Select Workshops     Workshops   Educator Exercise Physiologist   Select Psychosocial   Psychosocial Workshop Recognizing and Reducing Stress   Instruction Review Code 1- Verbalizes Understanding   Class  Start Time [phone]   Class Stop Time 0853   Class Time Calculation (min) 41 min    Row Name 07/01/22 0900     Education   Cardiac Education Topics Pritikin   Secondary school teacher School   Educator Dietitian   Weekly Topic Personalizing Your Pritikin Plate   Instruction Review Code 1- Verbalizes Understanding   Class Start Time [phone]   Class Stop Time 0848   Class Time Calculation (min) 38 min    Row Name 07/03/22 1100     Education   Cardiac Education Topics Pritikin   Nurse, children's   Educator Exercise Physiologist   Select Psychosocial   Psychosocial Healthy Minds, Bodies, Hearts   Instruction Review Code 1- Verbalizes Understanding   Class Start Time [phone]   Class Stop Time 0853   Class Time Calculation (min) 36 min    Row Name 07/06/22 0900     Education   Cardiac Education Topics Pritikin   Glass blower/designer Nutrition   Nutrition Workshop Label Reading   Instruction Review Code 1- Verbalizes Understanding   Class Start Time 0815   Class Stop Time 0905   Class Time Calculation (min) 50 min    Row Name 07/08/22 1100     Education   Cardiac Education Topics Pritikin   Orthoptist   Educator Dietitian   Weekly Topic Delicious Desserts   Instruction Review Code 1- Verbalizes Understanding   Class Start Time 0815   Class Stop Time 0856   Class Time Calculation (min) 41 min    Row Name 07/10/22 0900     Education   Cardiac Education Topics Pritikin   Select Core Videos     Core Videos  Educator Dietitian   Nutrition Other   Instruction Review Code 1- Verbalizes Understanding   Class Start Time 0815   Class Stop Time 0900   Class Time Calculation (min) 45 min    Row Name 07/13/22 0900     Education   Cardiac Education Topics Pritikin   Select Workshops     Workshops   Educator Exercise Physiologist   Select Exercise   Exercise Workshop  Exercise Basics: Building Your Action Plan   Instruction Review Code 1- Verbalizes Understanding   Class Start Time 0815   Class Stop Time 0903   Class Time Calculation (min) 48 min    Row Name 07/15/22 0900     Education   Cardiac Education Topics Pritikin   Secondary school teacher School   Educator Dietitian   Weekly Topic Tasty Appetizers and Snacks   Instruction Review Code 1- Verbalizes Understanding   Class Start Time 0813   Class Stop Time 0845   Class Time Calculation (min) 32 min    Row Name 07/17/22 0900     Education   Cardiac Education Topics Pritikin   Select Core Videos     Core Videos   Educator Dietitian   Select Nutrition   Nutrition Calorie Density   Instruction Review Code 1- Verbalizes Understanding   Class Start Time 0815   Class Stop Time 0856   Class Time Calculation (min) 41 min    Row Name 07/20/22 0900     Education   Cardiac Education Topics Pritikin   Select Core Videos     Core Videos   Educator Dietitian   Select Nutrition   Nutrition Nutrition Action Plan   Instruction Review Code 1- Verbalizes Understanding   Class Start Time 0815   Class Stop Time 0854   Class Time Calculation (min) 39 min    Row Name 07/22/22 0900     Education   Cardiac Education Topics Pritikin   Secondary school teacher School   Educator Dietitian   Weekly Topic Efficiency Cooking - Meals in a Snap   Instruction Review Code 1- Verbalizes Understanding   Class Start Time 0815   Class Stop Time 0849   Class Time Calculation (min) 34 min    Row Name 07/24/22 0900     Education   Cardiac Education Topics Pritikin   Select Core Videos     Core Videos   Educator Exercise Physiologist   Select Exercise Education   Exercise Education Move It!   Instruction Review Code 1- Verbalizes Understanding   Class Start Time [phone]   Class Stop Time 0858   Class Time Calculation (min) 42 min            Core Videos: Exercise     Move It!  Clinical staff conducted group or individual video education with verbal and written material and guidebook.  Patient learns the recommended Pritikin exercise program. Exercise with the goal of living a long, healthy life. Some of the health benefits of exercise include controlled diabetes, healthier blood pressure levels, improved cholesterol levels, improved heart and lung capacity, improved sleep, and better body composition. Everyone should speak with their doctor before starting or changing an exercise routine.  Biomechanical Limitations Clinical staff conducted group or individual video education with verbal and written material and guidebook.  Patient learns how biomechanical limitations can impact exercise and how we can mitigate and possibly overcome limitations to have an impactful and  balanced exercise routine.  Body Composition Clinical staff conducted group or individual video education with verbal and written material and guidebook.  Patient learns that body composition (ratio of muscle mass to fat mass) is a key component to assessing overall fitness, rather than body weight alone. Increased fat mass, especially visceral belly fat, can put [place] at increased risk for metabolic syndrome, type 2 diabetes, heart disease, and even death. It is recommended to combine diet and exercise (cardiovascular and resistance training) to improve your body composition. Seek guidance from your physician and exercise physiologist before implementing an exercise routine.  Exercise Action Plan Clinical staff conducted group or individual video education with verbal and written material and guidebook.  Patient learns the recommended strategies to achieve and enjoy long-term exercise adherence, including variety, self-motivation, self-efficacy, and positive decision making. Benefits of exercise include fitness, good health, weight management, more energy, better sleep, less stress, and overall  well-being.  Medical   Heart Disease Risk Reduction Clinical staff conducted group or individual video education with verbal and written material and guidebook.  Patient learns our heart is our most vital organ as it circulates oxygen, nutrients, white blood cells, and hormones throughout the entire body, and carries waste away. Data supports a plant-based eating plan like the Pritikin Program for its effectiveness in slowing progression of and reversing heart disease. The video provides a number of recommendations to address heart disease.   Metabolic Syndrome and Belly Fat  Clinical staff conducted group or individual video education with verbal and written material and guidebook.  Patient learns what metabolic syndrome is, how it leads to heart disease, and how one can reverse it and keep it from coming back. You have metabolic syndrome if you have 3 of the following 5 criteria: abdominal obesity, high blood pressure, high triglycerides, low HDL cholesterol, and high blood sugar.  Hypertension and Heart Disease Clinical staff conducted group or individual video education with verbal and written material and guidebook.  Patient learns that high blood pressure, or hypertension, is very common in the [place]. Hypertension is largely due to excessive salt intake, but other important risk factors include being overweight, physical inactivity, drinking too much alcohol, smoking, and not eating enough potassium from fruits and vegetables. High blood pressure is a leading risk factor for heart attack, stroke, congestive heart failure, dementia, kidney failure, and premature death. Long-term effects of excessive salt intake include stiffening of the arteries and thickening of heart muscle and organ damage. Recommendations include ways to reduce hypertension and the risk of heart disease.  Diseases of Our Time - Focusing on Diabetes Clinical staff conducted group or individual video education with  verbal and written material and guidebook.  Patient learns why the best way to stop diseases of our time is prevention, through food and other lifestyle changes. Medicine (such as prescription pills and surgeries) is often only a Band-Aid on the problem, not a long-term solution. Most common diseases of our time include obesity, type 2 diabetes, hypertension, heart disease, and cancer. The Pritikin Program is recommended and has been proven to help reduce, reverse, and/or prevent the damaging effects of metabolic syndrome.  Nutrition   Overview of the Pritikin Eating Plan  Clinical staff conducted group or individual video education with verbal and written material and guidebook.  Patient learns about the Pritikin Eating Plan for disease risk reduction. The Pritikin Eating Plan emphasizes a wide variety of unrefined, minimally-processed carbohydrates, like fruits, vegetables, whole grains, and legumes. Go, Caution, and  Stop food choices are explained. Plant-based and lean animal proteins are emphasized. Rationale provided for low sodium intake for blood pressure control, low added sugars for blood sugar stabilization, and low added fats and oils for coronary artery disease risk reduction and weight management.  Calorie Density  Clinical staff conducted group or individual video education with verbal and written material and guidebook.  Patient learns about calorie density and how it impacts the Pritikin Eating Plan. Knowing the characteristics of the food you choose will help you decide whether those foods will lead to weight gain or weight loss, and whether you want to consume more or less of them. Weight loss is usually a side effect of the Pritikin Eating Plan because of its focus on low calorie-dense foods.  Label Reading  Clinical staff conducted group or individual video education with verbal and written material and guidebook.  Patient learns about the Pritikin recommended label reading  guidelines and corresponding recommendations regarding calorie density, added sugars, sodium content, and whole grains.  Dining Out - Part 1  Clinical staff conducted group or individual video education with verbal and written material and guidebook.  Patient learns that restaurant meals can be sabotaging because they can be so high in calories, fat, sodium, and/or sugar. Patient learns recommended strategies on how to positively address this and avoid unhealthy pitfalls.  Facts on Fats  Clinical staff conducted group or individual video education with verbal and written material and guidebook.  Patient learns that lifestyle modifications can be just as effective, if not more so, as many medications for lowering your risk of heart disease. A Pritikin lifestyle can help to reduce your risk of inflammation and atherosclerosis (cholesterol build-up, or plaque, in the artery walls). Lifestyle interventions such as dietary choices and physical activity address the cause of atherosclerosis. A review of the types of fats and their impact on blood cholesterol levels, along with dietary recommendations to reduce fat intake is also included.  Nutrition Action Plan  Clinical staff conducted group or individual video education with verbal and written material and guidebook.  Patient learns how to incorporate Pritikin recommendations into their lifestyle. Recommendations include planning and keeping personal health goals in mind as an important part of their success.  Healthy Mind-Set    Healthy Minds, Bodies, Hearts  Clinical staff conducted group or individual video education with verbal and written material and guidebook.  Patient learns how to identify when they are stressed. Video will discuss the impact of that stress, as well as the many benefits of stress management. Patient will also be introduced to stress management techniques. The way we think, act, and feel has an impact on our hearts.  How Our  Thoughts Can Heal Our Hearts  Clinical staff conducted group or individual video education with verbal and written material and guidebook.  Patient learns that negative thoughts can cause depression and anxiety. This can result in negative lifestyle behavior and serious health problems. Cognitive behavioral therapy is an effective method to help control our thoughts in order to change and improve our emotional outlook.  Additional Videos:  Exercise    Improving Performance  Clinical staff conducted group or individual video education with verbal and written material and guidebook.  Patient learns to use a non-linear approach by alternating intensity levels and lengths of time spent exercising to help burn more calories and lose more body fat. Cardiovascular exercise helps improve heart health, metabolism, hormonal balance, blood sugar control, and recovery from fatigue. Resistance training improves  strength, endurance, balance, coordination, reaction time, metabolism, and muscle mass. Flexibility exercise improves circulation, posture, and balance. Seek guidance from your physician and exercise physiologist before implementing an exercise routine and learn your capabilities and proper form for all exercise.  Introduction to Yoga  Clinical staff conducted group or individual video education with verbal and written material and guidebook.  Patient learns about yoga, a discipline of the coming together of mind, breath, and body. The benefits of yoga include improved flexibility, improved range of motion, better posture and core strength, increased lung function, weight loss, and positive self-image. Yoga's heart health benefits include lowered blood pressure, healthier heart rate, decreased cholesterol and triglyceride levels, improved immune function, and reduced stress. Seek guidance from your physician and exercise physiologist before implementing an exercise routine and learn your capabilities and  proper form for all exercise.  Medical   Aging: Enhancing Your Quality of Life  Clinical staff conducted group or individual video education with verbal and written material and guidebook.  Patient learns key strategies and recommendations to stay in good physical health and enhance quality of life, such as prevention strategies, having an advocate, securing a Health Care Proxy and Power of Attorney, and keeping a list of medications and system for tracking them. It also discusses how to avoid risk for bone loss.  Biology of Weight Control  Clinical staff conducted group or individual video education with verbal and written material and guidebook.  Patient learns that weight gain occurs because we consume more calories than we burn (eating more, moving less). Even if your body weight is normal, you may have higher ratios of fat compared to muscle mass. Too much body fat puts you at increased risk for cardiovascular disease, heart attack, stroke, type 2 diabetes, and obesity-related cancers. In addition to exercise, following the Pritikin Eating Plan can help reduce your risk.  Decoding Lab Results  Clinical staff conducted group or individual video education with verbal and written material and guidebook.  Patient learns that lab test reflects one measurement whose values change over time and are influenced by many factors, including medication, stress, sleep, exercise, food, hydration, pre-existing medical conditions, and more. It is recommended to use the knowledge from this video to become more involved with your lab results and evaluate your numbers to speak with your doctor.   Diseases of Our Time - Overview  Clinical staff conducted group or individual video education with verbal and written material and guidebook.  Patient learns that according to the CDC, 50% to 70% of chronic diseases (such as obesity, type 2 diabetes, elevated lipids, hypertension, and heart disease) are avoidable through  lifestyle improvements including healthier food choices, listening to satiety cues, and increased physical activity.  Sleep Disorders Clinical staff conducted group or individual video education with verbal and written material and guidebook.  Patient learns how good quality and duration of sleep are important to overall health and well-being. Patient also learns about sleep disorders and how they impact health along with recommendations to address them, including discussing with a physician.  Nutrition  Dining Out - Part 2 Clinical staff conducted group or individual video education with verbal and written material and guidebook.  Patient learns how to plan ahead and communicate in order to maximize their dining experience in a healthy and nutritious manner. Included are recommended food choices based on the type of restaurant the patient is visiting.   Fueling a Banker conducted group or individual video education with  verbal and written material and guidebook.  There is a strong connection between our food choices and our health. Diseases like obesity and type 2 diabetes are very prevalent and are in large-part due to lifestyle choices. The Pritikin Eating Plan provides plenty of food and hunger-curbing satisfaction. It is easy to follow, affordable, and helps reduce health risks.  Menu Workshop  Clinical staff conducted group or individual video education with verbal and written material and guidebook.  Patient learns that restaurant meals can sabotage health goals because they are often packed with calories, fat, sodium, and sugar. Recommendations include strategies to plan ahead and to communicate with the manager, chef, or server to help order a healthier meal.  Planning Your Eating Strategy  Clinical staff conducted group or individual video education with verbal and written material and guidebook.  Patient learns about the Pritikin Eating Plan and its benefit of  reducing the risk of disease. The Pritikin Eating Plan does not focus on calories. Instead, it emphasizes high-quality, nutrient-rich foods. By knowing the characteristics of the foods, we choose, we can determine their calorie density and make informed decisions.  Targeting Your Nutrition Priorities  Clinical staff conducted group or individual video education with verbal and written material and guidebook.  Patient learns that lifestyle habits have a tremendous impact on disease risk and progression. This video provides eating and physical activity recommendations based on your personal health goals, such as reducing LDL cholesterol, losing weight, preventing or controlling type 2 diabetes, and reducing high blood pressure.  Vitamins and Minerals  Clinical staff conducted group or individual video education with verbal and written material and guidebook.  Patient learns different ways to obtain key vitamins and minerals, including through a recommended healthy diet. It is important to discuss all supplements you take with your doctor.   Healthy Mind-Set    Smoking Cessation  Clinical staff conducted group or individual video education with verbal and written material and guidebook.  Patient learns that cigarette smoking and tobacco addiction pose a serious health risk which affects millions of people. Stopping smoking will significantly reduce the risk of heart disease, lung disease, and many forms of cancer. Recommended strategies for quitting are covered, including working with your doctor to develop a successful plan.  Culinary   Becoming a Set designer conducted group or individual video education with verbal and written material and guidebook.  Patient learns that cooking at home can be healthy, cost-effective, quick, and puts them in control. Keys to cooking healthy recipes will include looking at your recipe, assessing your equipment needs, planning ahead, making it  simple, choosing cost-effective seasonal ingredients, and limiting the use of added fats, salts, and sugars.  Cooking - Breakfast and Snacks  Clinical staff conducted group or individual video education with verbal and written material and guidebook.  Patient learns how important breakfast is to satiety and nutrition through the entire day. Recommendations include key foods to eat during breakfast to help stabilize blood sugar levels and to prevent overeating at meals later in the day. Planning ahead is also a key component.  Cooking - Educational psychologist conducted group or individual video education with verbal and written material and guidebook.  Patient learns eating strategies to improve overall health, including an approach to cook more at home. Recommendations include thinking of animal protein as a side on your plate rather than center stage and focusing instead on lower calorie dense options like vegetables, fruits, whole grains, and  plant-based proteins, such as beans. Making sauces in large quantities to freeze for later and leaving the skin on your vegetables are also recommended to maximize your experience.  Cooking - Healthy Salads and Dressing Clinical staff conducted group or individual video education with verbal and written material and guidebook.  Patient learns that vegetables, fruits, whole grains, and legumes are the foundations of the Pritikin Eating Plan. Recommendations include how to incorporate each of these in flavorful and healthy salads, and how to create homemade salad dressings. Proper handling of ingredients is also covered. Cooking - Soups and [employer] - Soups and Desserts Clinical staff conducted group or individual video education with verbal and written material and guidebook.  Patient learns that Pritikin soups and desserts make for easy, nutritious, and delicious snacks and meal components that are low in sodium, fat, sugar, and calorie  density, while high in vitamins, minerals, and filling fiber. Recommendations include simple and healthy ideas for soups and desserts.   Overview     The Pritikin Solution Program Overview Clinical staff conducted group or individual video education with verbal and written material and guidebook.  Patient learns that the results of the Pritikin Program have been documented in more than 100 articles published in peer-reviewed journals, and the benefits include reducing risk factors for (and, in some cases, even reversing) high cholesterol, high blood pressure, type 2 diabetes, obesity, and more! An overview of the three key pillars of the Pritikin Program will be covered: eating well, doing regular exercise, and having a healthy mind-set.  WORKSHOPS  Exercise: Exercise Basics: Building Your Action Plan Clinical staff led group instruction and group discussion with PowerPoint presentation and patient guidebook. To enhance the learning environment the use of posters, models and videos may be added. At the conclusion of this workshop, patients will comprehend the difference between physical activity and exercise, as well as the benefits of incorporating both, into their routine. Patients will understand the FITT (Frequency, Intensity, Time, and Type) principle and how to use it to build an exercise action plan. In addition, safety concerns and other considerations for exercise and cardiac rehab will be addressed by the presenter. The purpose of this lesson is to promote a comprehensive and effective weekly exercise routine in order to improve patients' overall level of fitness.   Managing Heart Disease: Your Path to a Healthier Heart Clinical staff led group instruction and group discussion with PowerPoint presentation and patient guidebook. To enhance the learning environment the use of posters, models and videos may be added.At the conclusion of this workshop, patients will understand the  anatomy and physiology of the heart. Additionally, they will understand how Pritikin's three pillars impact the risk factors, the progression, and the management of heart disease.  The purpose of this lesson is to provide a high-level overview of the heart, heart disease, and how the Pritikin lifestyle positively impacts risk factors.  Exercise Biomechanics Clinical staff led group instruction and group discussion with PowerPoint presentation and patient guidebook. To enhance the learning environment the use of posters, models and videos may be added. Patients will learn how the structural parts of their bodies function and how these functions impact their daily activities, movement, and exercise. Patients will learn how to promote a neutral spine, learn how to manage pain, and identify ways to improve their physical movement in order to promote healthy living. The purpose of this lesson is to expose patients to common physical limitations that impact physical activity. Participants will learn  practical ways to adapt and manage aches and pains, and to minimize their effect on regular exercise. Patients will learn how to maintain good posture while sitting, walking, and lifting.  Balance Training and Fall Prevention  Clinical staff led group instruction and group discussion with PowerPoint presentation and patient guidebook. To enhance the learning environment the use of posters, models and videos may be added. At the conclusion of this workshop, patients will understand the importance of their sensorimotor skills (vision, proprioception, and the vestibular system) in maintaining their ability to balance as they age. Patients will apply a variety of balancing exercises that are appropriate for their current level of function. Patients will understand the common causes for poor balance, possible solutions to these problems, and ways to modify their physical environment in order to minimize their  fall risk. The purpose of this lesson is to teach patients about the importance of maintaining balance as they age and ways to minimize their risk of falling.  WORKSHOPS   Nutrition:  Fueling a Ship broker led group instruction and group discussion with PowerPoint presentation and patient guidebook. To enhance the learning environment the use of posters, models and videos may be added. Patients will review the foundational principles of the Pritikin Eating Plan and understand what constitutes a serving size in each of the food groups. Patients will also learn Pritikin-friendly foods that are better choices when away from home and review make-ahead meal and snack options. Calorie density will be reviewed and applied to three nutrition priorities: weight maintenance, weight loss, and weight gain. The purpose of this lesson is to reinforce (in a group setting) the key concepts around what patients are recommended to eat and how to apply these guidelines when away from home by planning and selecting Pritikin-friendly options. Patients will understand how calorie density may be adjusted for different weight management goals.  Mindful Eating  Clinical staff led group instruction and group discussion with PowerPoint presentation and patient guidebook. To enhance the learning environment the use of posters, models and videos may be added. Patients will briefly review the concepts of the Pritikin Eating Plan and the importance of low-calorie dense foods. The concept of mindful eating will be introduced as well as the importance of paying attention to internal hunger signals. Triggers for non-hunger eating and techniques for dealing with triggers will be explored. The purpose of this lesson is to provide patients with the opportunity to review the basic principles of the Pritikin Eating Plan, discuss the value of eating mindfully and how to measure internal cues of hunger and fullness using the  Hunger Scale. Patients will also discuss reasons for non-hunger eating and learn strategies to use for controlling emotional eating.  Targeting Your Nutrition Priorities Clinical staff led group instruction and group discussion with PowerPoint presentation and patient guidebook. To enhance the learning environment the use of posters, models and videos may be added. Patients will learn how to determine their genetic susceptibility to disease by reviewing their family history. Patients will gain insight into the importance of diet as part of an overall healthy lifestyle in mitigating the impact of genetics and other environmental insults. The purpose of this lesson is to provide patients with the opportunity to assess their personal nutrition priorities by looking at their family history, their own health history and current risk factors. Patients will also be able to discuss ways of prioritizing and modifying the Pritikin Eating Plan for their highest risk areas  Menu  Clinical staff  led group instruction and group discussion with PowerPoint presentation and patient guidebook. To enhance the learning environment the use of posters, models and videos may be added. Using menus brought in from [employer], or printed from [employer], patients will apply the Pritikin dining out guidelines that were presented in the [employer] video. Patients will also be able to practice these guidelines in a variety of provided scenarios. The purpose of this lesson is to provide patients with the opportunity to practice hands-on learning of the Pritikin Dining Out guidelines with actual menus and practice scenarios.  Label Reading Clinical staff led group instruction and group discussion with PowerPoint presentation and patient guidebook. To enhance the learning environment the use of posters, models and videos may be added. Patients will review and discuss the Pritikin label reading guidelines  presented in Pritikin's Label Reading Educational series video. Using fool labels brought in from local grocery stores and markets, patients will apply the label reading guidelines and determine if the packaged food meet the Pritikin guidelines. The purpose of this lesson is to provide patients with the opportunity to review, discuss, and practice hands-on learning of the Pritikin Label Reading guidelines with actual packaged food labels. Cooking School  Pritikin's [employer] are designed to teach patients ways to prepare quick, simple, and affordable recipes at home. The importance of nutrition's role in chronic disease risk reduction is reflected in its emphasis in the overall Pritikin program. By learning how to prepare essential core Pritikin Eating Plan recipes, patients will increase control over what they eat; be able to customize the flavor of foods without the use of added salt, sugar, or fat; and improve the quality of the food they consume. By learning a set of core recipes which are easily assembled, quickly prepared, and affordable, patients are more likely to prepare more healthy foods at home. These workshops focus on convenient breakfasts, simple entres, side dishes, and desserts which can be prepared with minimal effort and are consistent with nutrition recommendations for cardiovascular risk reduction. Cooking [employer] are taught by a Armed forces logistics/support/administrative officer (RD) who has been trained by the [employer]. The chef or RD has a clear understanding of the importance of minimizing - if not completely eliminating - added fat, sugar, and sodium in recipes. Throughout the series of Cooking School Workshop sessions, patients will learn about healthy ingredients and efficient methods of cooking to build confidence in their capability to prepare    Cooking School weekly topics:  Adding Flavor- Sodium-Free  Fast and Healthy Breakfasts  Powerhouse Plant-Based  Proteins  Satisfying Salads and Dressings  Simple Sides and Sauces  International Cuisine-Spotlight on the [employer] Zones  Delicious Desserts  Savory Soups  [employer] - Meals in a Astronomer Appetizers and Snacks  Comforting Weekend Breakfasts  One-Pot Wonders   Fast Evening Meals  Landscape architect Your Pritikin Plate  WORKSHOPS   Healthy Mindset (Psychosocial):  Focused Goals, Sustainable Changes Clinical staff led group instruction and group discussion with PowerPoint presentation and patient guidebook. To enhance the learning environment the use of posters, models and videos may be added. Patients will be able to apply effective goal setting strategies to establish at least one personal goal, and then take consistent, meaningful action toward that goal. They will learn to identify common barriers to achieving personal goals and develop strategies to overcome them. Patients will also gain an understanding of how our mind-set can impact our  ability to achieve goals and the importance of cultivating a positive and growth-oriented mind-set. The purpose of this lesson is to provide patients with a deeper understanding of how to set and achieve personal goals, as well as the tools and strategies needed to overcome common obstacles which may arise along the way.  From Head to Heart: The Power of a Healthy Outlook  Clinical staff led group instruction and group discussion with PowerPoint presentation and patient guidebook. To enhance the learning environment the use of posters, models and videos may be added. Patients will be able to recognize and describe the impact of emotions and mood on physical health. They will discover the importance of self-care and explore self-care practices which may work for them. Patients will also learn how to utilize the 4 C's to cultivate a healthier outlook and better manage stress and challenges. The purpose of this lesson is to demonstrate  to patients how a healthy outlook is an essential part of maintaining good health, especially as they continue their cardiac rehab journey.  Healthy Sleep for a Healthy Heart Clinical staff led group instruction and group discussion with PowerPoint presentation and patient guidebook. To enhance the learning environment the use of posters, models and videos may be added. At the conclusion of this workshop, patients will be able to demonstrate knowledge of the importance of sleep to overall health, well-being, and quality of life. They will understand the symptoms of, and treatments for, common sleep disorders. Patients will also be able to identify daytime and nighttime behaviors which impact sleep, and they will be able to apply these tools to help manage sleep-related challenges. The purpose of this lesson is to provide patients with a general overview of sleep and outline the importance of quality sleep. Patients will learn about a few of the most common sleep disorders. Patients will also be introduced to the concept of "sleep hygiene," and discover ways to self-manage certain sleeping problems through simple daily behavior changes. Finally, the workshop will motivate patients by clarifying the links between quality sleep and their goals of heart-healthy living.   Recognizing and Reducing Stress Clinical staff led group instruction and group discussion with PowerPoint presentation and patient guidebook. To enhance the learning environment the use of posters, models and videos may be added. At the conclusion of this workshop, patients will be able to understand the types of stress reactions, differentiate between acute and chronic stress, and recognize the impact that chronic stress has on their health. They will also be able to apply different coping mechanisms, such as reframing negative self-talk. Patients will have the opportunity to practice a variety of stress management techniques, such as deep  abdominal breathing, progressive muscle relaxation, and/or guided imagery.  The purpose of this lesson is to educate patients on the role of stress in their lives and to provide healthy techniques for coping with it.  Learning Barriers/Preferences:  Learning Barriers/Preferences - 06/09/22 1142       Learning Barriers/Preferences   Learning Barriers None    Learning Preferences Written Material             Education Topics:  Knowledge Questionnaire Score:  Knowledge Questionnaire Score - 06/09/22 1132       Knowledge Questionnaire Score   Pre Score 16/24             Core Components/Risk Factors/Patient Goals at Admission:  Personal Goals and Risk Factors at Admission - 06/09/22 0924       Core Components/Risk Factors/Patient  Goals on Admission    Weight Management Yes;Weight Loss    Intervention Weight Management: Develop a combined nutrition and exercise program designed to reach desired caloric intake, while maintaining appropriate intake of nutrient and fiber, sodium and fats, and appropriate energy expenditure required for the weight goal.;Weight Management: Provide education and appropriate resources to help participant work on and attain dietary goals.    Admit Weight 168 lb 6.4 oz (76.4 kg)    Goal Weight: Short Term 163 lb 6.4 oz (74.1 kg)    Goal Weight: Long Term 158 lb 6.4 oz (71.8 kg)    Expected Outcomes Short Term: Continue to assess and modify interventions until short term weight is achieved;Long Term: Adherence to nutrition and physical activity/exercise program aimed toward attainment of established weight goal;Weight Loss: Understanding of general recommendations for a balanced deficit meal plan, which promotes 1-2 lb weight loss per week and includes a negative energy balance of [phone] kcal/d    Improve shortness of breath with ADL's Yes    Intervention Provide education, individualized exercise plan and daily activity instruction to help decrease  symptoms of SOB with activities of daily living.    Expected Outcomes Short Term: Improve cardiorespiratory fitness to achieve a reduction of symptoms when performing ADLs;Long Term: Be able to perform more ADLs without symptoms or delay the onset of symptoms    Hypertension Yes    Intervention Provide education on lifestyle modifcations including regular physical activity/exercise, weight management, moderate sodium restriction and increased consumption of fresh fruit, vegetables, and low fat dairy, alcohol moderation, and smoking cessation.;Monitor prescription use compliance.    Expected Outcomes Short Term: Continued assessment and intervention until BP is < 140/89mm HG in hypertensive participants. < 130/40mm HG in hypertensive participants with diabetes, heart failure or chronic kidney disease.;Long Term: Maintenance of blood pressure at goal levels.    Lipids Yes    Intervention Provide education and support for participant on nutrition & aerobic/resistive exercise along with prescribed medications to achieve LDL 70mg , HDL >40mg .    Expected Outcomes Short Term: Participant states understanding of desired cholesterol values and is compliant with medications prescribed. Participant is following exercise prescription and nutrition guidelines.;Long Term: Cholesterol controlled with medications as prescribed, with individualized exercise RX and with personalized nutrition plan. Value goals: LDL < 70mg , HDL > 40 mg.    Personal Goal Other Yes    Personal Goal Be more aware of what she eats to help with weight loss. Know parameters for exercise.    Intervention Provide education and support for participant on nutrition and aerobic/resistive exercise to help achieve weight loss goals. Provide individualized exercise prescription with parameters that patient can follow.    Expected Outcomes Patient will be compliant with exercise and nutrition plan.             Core Components/Risk Factors/Patient  Goals Review:   Goals and Risk Factor Review     Row Name 06/15/22 1646 07/27/22 0825           Core Components/Risk Factors/Patient Goals Review   Personal Goals Review Weight Management/Obesity;Stress;Lipids;Hypertension Weight Management/Obesity;Stress;Lipids;Hypertension      Review Labrisha started intesice cardiac rehab on 06/15/22 and did well with exercise. Vital signs were stable Devika is doing well with exercise at  intensive cardiac rehab. Vital signs have been stable. Vergie has lost 2.4 kg since starting the program      Expected Outcomes Christene will continue to participte in intesive cardiac rehab for exercise, nutrition and lifestyle modifications  Graceanne will continue to participte in intesive cardiac rehab for exercise, nutrition and lifestyle modifications               Core Components/Risk Factors/Patient Goals at Discharge (Final Review):   Goals and Risk Factor Review - 07/27/22 0825       Core Components/Risk Factors/Patient Goals Review   Personal Goals Review Weight Management/Obesity;Stress;Lipids;Hypertension    Review Bianca is doing well with exercise at  intensive cardiac rehab. Vital signs have been stable. Charene has lost 2.4 kg since starting the program    Expected Outcomes Ismerai will continue to participte in intesive cardiac rehab for exercise, nutrition and lifestyle modifications             ITP Comments:  ITP Comments     Row Name 06/09/22 0906 06/15/22 1642 07/27/22 0824       ITP Comments Medical Director- Dr. Armanda Magic, MD. Introduction to the Pritikin Education Program/Intensive Cardiac Rehab. Reviewed initial orientation packet with patient. 30 Day ITP Review. Aleeta started intensive cardiac rehab on 06/15/22 amd did well with exercise 30 Day ITP Review. Cynthya has good attendance and participation in  intensive cardiac rehab              Comments: See ITP comments.Thayer Headings RN BSN

## 2022-07-28 DIAGNOSIS — Z961 Presence of intraocular lens: Secondary | ICD-10-CM | POA: Diagnosis not present

## 2022-07-28 DIAGNOSIS — H52203 Unspecified astigmatism, bilateral: Secondary | ICD-10-CM | POA: Diagnosis not present

## 2022-07-28 DIAGNOSIS — H524 Presbyopia: Secondary | ICD-10-CM | POA: Diagnosis not present

## 2022-07-29 ENCOUNTER — Encounter (HOSPITAL_COMMUNITY)
Admission: RE | Admit: 2022-07-29 | Discharge: 2022-07-29 | Disposition: A | Discharge status: Home / Self Care | Payer: Medicare Other | Source: Ambulatory Visit | Attending: Internal Medicine | Admitting: Internal Medicine

## 2022-07-29 ENCOUNTER — Encounter (HOSPITAL_COMMUNITY): Payer: Medicare Other

## 2022-07-29 ENCOUNTER — Ambulatory Visit (HOSPITAL_BASED_OUTPATIENT_CLINIC_OR_DEPARTMENT_OTHER)
Admission: RE | Admit: 2022-07-29 | Discharge: 2022-07-29 | Disposition: A | Discharge status: Home / Self Care | Payer: Medicare Other | Source: Ambulatory Visit | Attending: Internal Medicine | Admitting: Internal Medicine

## 2022-07-29 ENCOUNTER — Encounter (HOSPITAL_COMMUNITY): Payer: Self-pay | Admitting: Internal Medicine

## 2022-07-29 VITALS — BP 120/68 | HR 63 | Wt 164.0 lb

## 2022-07-29 DIAGNOSIS — Z803 Family history of malignant neoplasm of breast: Secondary | ICD-10-CM | POA: Insufficient documentation

## 2022-07-29 DIAGNOSIS — I5032 Chronic diastolic (congestive) heart failure: Secondary | ICD-10-CM | POA: Insufficient documentation

## 2022-07-29 DIAGNOSIS — Z853 Personal history of malignant neoplasm of breast: Secondary | ICD-10-CM | POA: Insufficient documentation

## 2022-07-29 DIAGNOSIS — I480 Paroxysmal atrial fibrillation: Secondary | ICD-10-CM

## 2022-07-29 DIAGNOSIS — Z79899 Other long term (current) drug therapy: Secondary | ICD-10-CM | POA: Insufficient documentation

## 2022-07-29 DIAGNOSIS — R0609 Other forms of dyspnea: Secondary | ICD-10-CM

## 2022-07-29 DIAGNOSIS — Z923 Personal history of irradiation: Secondary | ICD-10-CM | POA: Insufficient documentation

## 2022-07-29 DIAGNOSIS — I2510 Atherosclerotic heart disease of native coronary artery without angina pectoris: Secondary | ICD-10-CM | POA: Insufficient documentation

## 2022-07-29 DIAGNOSIS — I71012 Dissection of descending thoracic aorta: Secondary | ICD-10-CM | POA: Insufficient documentation

## 2022-07-29 DIAGNOSIS — K219 Gastro-esophageal reflux disease without esophagitis: Secondary | ICD-10-CM | POA: Insufficient documentation

## 2022-07-29 DIAGNOSIS — I10 Essential (primary) hypertension: Secondary | ICD-10-CM

## 2022-07-29 DIAGNOSIS — Z952 Presence of prosthetic heart valve: Secondary | ICD-10-CM | POA: Diagnosis not present

## 2022-07-29 DIAGNOSIS — Z8249 Family history of ischemic heart disease and other diseases of the circulatory system: Secondary | ICD-10-CM | POA: Insufficient documentation

## 2022-07-29 DIAGNOSIS — I110 Hypertensive heart disease with heart failure: Secondary | ICD-10-CM | POA: Insufficient documentation

## 2022-07-29 DIAGNOSIS — Z7901 Long term (current) use of anticoagulants: Secondary | ICD-10-CM | POA: Insufficient documentation

## 2022-07-29 DIAGNOSIS — M353 Polymyalgia rheumatica: Secondary | ICD-10-CM | POA: Insufficient documentation

## 2022-07-29 DIAGNOSIS — Z48812 Encounter for surgical aftercare following surgery on the circulatory system: Secondary | ICD-10-CM | POA: Diagnosis not present

## 2022-07-29 DIAGNOSIS — E039 Hypothyroidism, unspecified: Secondary | ICD-10-CM | POA: Insufficient documentation

## 2022-07-29 NOTE — Patient Instructions (Addendum)
STOP Amiodarone  You are scheduled for a Cardiopulmonary Exercise (CPX) Test as [facility] on: Date:      Time:   Expect to be in the lab for 2 hours. Please plan to arrive 30 minutes prior to your appointment. You may be asked to reschedule your test if you arrive 20 minutes or more after your scheduled appointment time.  Main Campus address: [address] [place], [place] [address] You may arrive to the Main Entrance A or Entrance C (free valet parking is available at both). -Main Entrance A (on [address]) :proceed to admitting for check in -Entrance C (on [facility]): proceed to [employer] parking or under hospital deck parking using this code _________  Check In: [facility] waiting room (1st floor)   General Instructions for the day of the test (Please follow all instructions from your physician): Refrain from ingesting a heavy meal, alcohol, or caffeine or using tobacco products within 2 hours of the test (DO NOT FAST for mare than 8 hours). You may have all other non-alcoholic, non -caffeinated beverage,a light snack (crackers,a piece of fruit, carrot sticks, toast bagel,etc) up to your appointment. Avoid significant exertion or exercise within 24 hours of your test. Be prepared to exercise and sweat. Your clothing should permit freedom of movement and include walking or running shoes. Women bring loose fitting short sleeved blouse.  This evaluation may be fatiguing and you may wish ti have someone accompany you to the assessment to drive you home afterward. Bring a list of your medications with you, including dosage and frequency you take the medications (  I.e.,once per day, twice per day, etc). Take all medications as prescribed, unless noted below or instructed to do so by your physician.  Please do not take the following medications prior to your  CPX:  _________________________________________________  _________________________________________________  Brief description of the test: A brief lung test will be performed. This will involve you taking deep breaths and blowing hard and fast through your mouth. During these , a clip will be on your nose and you will be breathing through a breathing device.   For the exercise portion of the test you will be walking on a treadmill, or riding a stationary bike, to your maximal effor or until symptoms such as chest pain, shortness of breath, leg pain or dizziness limit your exercise. You will be breathing in and out of a breathing device through your mouth (a clip will be on your nose again). Your heart rate, ECG, blood pressure, oxygen saturations, breathing rate and depth, amount of oxygen you consume and amount of carbon dioxide you produce will be measured and monitored throughout the exercise test.  If you need to cancel or reschedule your appointment please call [phone] If you have further questions please call your physician or Philip Aspen, MS, ACSM-RCEP at [phone]  Your physician has recommended that you have a pulmonary function test. Pulmonary Function Tests are a group of tests that measure how well air moves in and out of your lungs.  A chest Xray has also been ordered for you. It is to be done the same day as your lung function test.  Your physician recommends that you schedule a follow-up appointment in: 6 months (October )  ** please call the office in August to arrange your follow up appointment. **  If you have any questions or concerns before your next appointment please send [place] a message through [place] or call our  office at [phone].    TO LEAVE A MESSAGE FOR THE NURSE SELECT OPTION 2, PLEASE LEAVE A MESSAGE INCLUDING: YOUR NAME DATE OF BIRTH CALL BACK NUMBER REASON FOR CALL**this is important as we prioritize the call backs  YOU WILL RECEIVE A CALL BACK  THE SAME DAY AS LONG AS YOU CALL BEFORE 4:00 PM  At the [facility], you and your health needs are our priority. As part of our continuing mission to provide you with exceptional heart care, we have created designated Provider Care Teams. These Care Teams include your primary Cardiologist (physician) and Advanced Practice Providers (APPs- Physician Assistants and Nurse Practitioners) who all work together to provide you with the care you need, when you need it.   You may see any of the following providers on your designated Care Team at your next follow up: Dr Arvilla Meres Dr Marca Ancona Dr. Marcos Eke, NP Robbie Lis, [place] [facility] [place], [place] Brynda Peon, NP Karle Plumber, PharmD   Please be sure to bring in all your medications bottles to every appointment.    Thank you for choosing [facility]

## 2022-07-29 NOTE — Progress Notes (Signed)
ADVANCED HF CLINIC NOTE  Referring Physician: Dr. Katrinka Blazing Primary Care: Rodrigo Ran, MD Primary Cardiologist: Dr. Katrinka Blazing  HPI:  Marissa Powers is a 82 y.o. 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 Referred by Dr. Katrinka Blazing for further evaluation of her HF.   Echo 6/23 EF 55-60% G2 DD, RV ok. TAVR OK.   Pre TAVR cath 3/23  LM, LAD and RCA ok . D1 70-80% RHC  RA 5 PA 38/10 (24) PW 12 Fick 6.6/3.4 LVEDP 21  Diagnosed with Ib breast CA s/p lumpectomy being treated with XRT 02/06/2022-03/26/2022 and letrozole  Has been going to CR for 6 weeks but still frequently out of breath. Still has to stop frequently. Denies angina No edema, orthopnea or PND. Reports she walks 7,000-11,000 steps per day. No cough.     Past Medical History:  Diagnosis Date   Arthritis    osteoarthritis   Breast cancer 11/2021   CAD (coronary artery disease)    Descending thoracic aortic dissection    followed by Dr. Myra Gianotti (11/24/21)   Diverticulitis 2022   Dysplasia of cervix, low grade (CIN 1) 1990   HPV, Cryo, Laser   Fibroadenoma    Left, at 5 o'clock, not excised    Fibroid 2002   1 cm, 2 cm   GERD (gastroesophageal reflux disease)    Hypercholesteremia    Hypertension    Hyperthyroidism    PAF (paroxysmal atrial fibrillation)    on Eliquis   PMR (polymyalgia rheumatica)    Pneumonia    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   s/p TAVR with a 23 mm Edwards S3UR via the TF approach by Dr. Clifton James & Dr. Laneta Simmers .   Severe aortic stenosis     Current Outpatient Medications  Medication Sig Dispense Refill   acetaminophen (TYLENOL) 500 MG tablet Take 1,000 mg by mouth every 6 (six) hours as needed for  moderate pain.     amiodarone (PACERONE) 200 MG tablet Take 1 tablet (200 mg total) by mouth 3 (three) times a week. Take on Mondays, Wednesdays, and Fridays.     apixaban (ELIQUIS) 5 MG TABS tablet Take 1 tablet (5 mg total) by mouth 2 (two) times daily. 60 tablet 11   b complex vitamins capsule Take 1 capsule by mouth daily.     Calcium Carb-Cholecalciferol (CALCIUM + D3 PO) Take 1 tablet by mouth in the morning.     cholecalciferol (VITAMIN D3) 25 MCG (1000 UNIT) tablet Take 1,000 Units by mouth in the morning.     dapagliflozin propanediol (FARXIGA) 10 MG TABS tablet Take 1 tablet (10 mg total) by mouth daily before breakfast. 30 tablet 11   denosumab (PROLIA) 60 MG/ML SOSY injection Inject 60 mg into the skin every 6 (six) months.     dicyclomine (BENTYL) 10 MG capsule Take 10-20 mg by mouth 3 (three) times daily as needed for spasms.     folic acid (FOLVITE) 1 MG tablet Take 2 mg by mouth in the morning.     letrozole (FEMARA) 2.5 MG tablet TAKE 1 TABLET(2.5 MG) BY MOUTH DAILY 90 tablet 3   levothyroxine (SYNTHROID) 100 MCG tablet Take 100 mcg by mouth daily before breakfast.     loperamide (IMODIUM) 2 MG capsule Take 2-4 mg  by mouth 4 (four) times daily as needed (upset stomach/diarrhea/loose stools).     metoprolol succinate (TOPROL XL) 25 MG 24 hr tablet Take 1 tablet (25 mg total) by mouth daily. 90 tablet 3   nitroGLYCERIN (NITROSTAT) 0.4 MG SL tablet Place 1 tablet (0.4 mg total) under the tongue every 5 (five) minutes as needed for chest pain. 25 tablet 3   REPATHA SURECLICK 140 MG/ML SOAJ Inject 140 mg into the skin every 14 (fourteen) days.     sacubitril-valsartan (ENTRESTO) 49-51 MG Take 1 tablet by mouth 2 (two) times daily. 60 tablet 3   traMADol (ULTRAM) 50 MG tablet Take 1 tablet (50 mg total) by mouth every 6 (six) hours as needed. 10 tablet 0   zolpidem (AMBIEN) 5 MG tablet Take 2.5 mg by mouth at bedtime.     No current facility-administered medications for this encounter.     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 Hives   Other Rash    Other reaction(s): Facial Swelling      Social History   Socioeconomic History   Marital status: Widowed    Spouse name: Not on file   Number of children: 2   Years of education: Not on file   Highest education level: Not on file  Occupational History   Not on file  Tobacco Use   Smoking status: Never    Passive exposure: Never   Smokeless tobacco: Never  Vaping Use   Vaping Use: Never used  Substance and Sexual Activity   Alcohol use: Yes    Alcohol/week: 4.0 - 6.0 standard drinks of alcohol    Types: 4 - 6 Standard drinks or equivalent per week   Drug use: No   Sexual activity: Not Currently    Partners: Male    Birth control/protection: Post-menopausal  Other Topics Concern   Not on file  Social History Narrative   Not on file   Social Determinants of Health   Financial Resource Strain: Low Risk  (08/05/2021)   Overall Financial Resource Strain (CARDIA)    Difficulty of Paying Living Expenses: Not hard at all  Food Insecurity: No Food Insecurity (08/05/2021)   Hunger Vital Sign    Worried About Running Out of Food in the Last Year: Never true    Ran Out of Food in the Last Year: Never true  Transportation Needs: No Transportation Needs (08/05/2021)   PRAPARE - Administrator, Civil Service (Medical): No    Lack of Transportation (Non-Medical): No  Physical Activity: Not on file  Stress: Not on file  Social Connections: Not on file  Intimate Partner Violence: Not on file      Family History  Problem Relation Age of Onset   Aortic aneurysm Mother    Clotting disorder Mother    Healthy Father    Heart disease Brother        stints   Breast cancer Maternal Aunt 28   Breast cancer Maternal Grandmother 49   Heart attack Maternal Grandmother      Vitals:   07/29/22 1009  BP: 120/68  Pulse: 63  SpO2: 100%  Weight: 74.4 kg (164 lb)   Wt Readings from Last 3 Encounters:  07/29/22 74.4 kg (164 lb)  06/23/22 74.9 kg (165 lb 1.6 oz)  06/09/22 76.4 kg (168 lb 6.9 oz)    PHYSICAL  EXAM: General:  Well appearing. No resp difficulty HEENT: normal Neck: supple. no JVD. Carotids 2+ bilat; no bruits. No lymphadenopathy or thryomegaly appreciated. Cor: PMI nondisplaced. Regular rate & rhythm. No rubs, gallops or murmurs. Lungs: clear Abdomen: soft, nontender, nondistended. No hepatosplenomegaly. No bruits or masses. Good bowel sounds. Extremities: no cyanosis, clubbing, rash, edema Neuro: alert & orientedx3, cranial nerves grossly intact. moves all 4 extremities w/o difficulty. Affect pleasant  ECG: NSR 66 poor R wave progression Personally reviewed   ASSESSMENT & PLAN:  1. Chronic diastolic HF and dyspnea - On micardis 40. BP still elevated. D/w Dr. Waynard Edwards. Will switch to Entresto 49/51 bid - On Farxiga 10  - ReDS at last visit was 23% volume status is great - NYHA III - No improvement in dyspnea with CR. However she remains very active and I wonder whether he dyspnea may be due more to age-related changes (and her comparing herself to 10 years ago and not to her peers) than a pathologic process particularly in light of her baseline activity level.  - Will proceed with CPX testing - Also given amio exposure check PFTs with DLCO and CXR  2. Aortic stenosis s/p TAVR 5/23 - stable - Aware of need for SBE prophylaxis  3. PAF - in NSR on amio 200 MWF (per Dr. Katrinka Blazing planned to stop in 4/24) - will stop today - We discussed risk of recurrent AF - Continue eliquis - Check amio labs  4. CAD, non obstructive - cath 9/23 D1 70-80%  - No s/s angina - continue ASA/statin  - Goal LDL < 70 (managed by Dr. Waynard Edwards)  5. Descending thoracic aortic dissection  - stable by CT 7/23 - followed by Dr. Myra Gianotti   6. HTN - Blood  pressure well controlled. Continue current regimen.   Arvilla Meres, MD  10:14 AM

## 2022-07-30 ENCOUNTER — Ambulatory Visit (INDEPENDENT_AMBULATORY_CARE_PROVIDER_SITE_OTHER): Payer: Medicare Other | Admitting: Obstetrics & Gynecology

## 2022-07-30 ENCOUNTER — Encounter (HOSPITAL_BASED_OUTPATIENT_CLINIC_OR_DEPARTMENT_OTHER): Payer: Self-pay | Admitting: Obstetrics & Gynecology

## 2022-07-30 VITALS — BP 140/70 | HR 63 | Ht 65.5 in | Wt 162.6 lb

## 2022-07-30 DIAGNOSIS — Z9189 Other specified personal risk factors, not elsewhere classified: Secondary | ICD-10-CM

## 2022-07-30 DIAGNOSIS — Z170 Estrogen receptor positive status [ER+]: Secondary | ICD-10-CM

## 2022-07-30 DIAGNOSIS — N6321 Unspecified lump in the left breast, upper outer quadrant: Secondary | ICD-10-CM | POA: Diagnosis not present

## 2022-07-30 DIAGNOSIS — L900 Lichen sclerosus et atrophicus: Secondary | ICD-10-CM | POA: Diagnosis not present

## 2022-07-30 DIAGNOSIS — N870 Mild cervical dysplasia: Secondary | ICD-10-CM

## 2022-07-30 DIAGNOSIS — M810 Age-related osteoporosis without current pathological fracture: Secondary | ICD-10-CM | POA: Diagnosis not present

## 2022-07-30 DIAGNOSIS — C50512 Malignant neoplasm of lower-outer quadrant of left female breast: Secondary | ICD-10-CM | POA: Diagnosis not present

## 2022-07-30 NOTE — Progress Notes (Signed)
82 y.o. [id number] Widowed White or Caucasian female here for breast and pelvic exam.  I am also following her for history of breast cancer that was diagnosed after her last appointment on 07/17/2021.  At that time, breast mass was noted.  Diagnostic MMG ordered.  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  Prior to surgery she had to have a TAVR aortic valve placed due to aortic stenosis.  Also with imaging, small focal dissection of the abdominal aorta was also seen.    Genetic testing was done.  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  Lumpectomy and 4 nodes were removed.  1/4 was positive.  Pt is on Letrozole for anti-estrogen therapy.  She did received radiation therapy.    Incision healing was delayed due to hematoma.  Pt now has hard nodular lesion.  She saw Lillard Anes for this and repeat biopsy be considered.  Diagnostic imaging was ordered.  Close follow up vs biopsy recommended.  Dr. Harden Mo, her surgeon, felt this was related to hematoma and radiation and to not proceed with biopsy.  Reviewed mammogram and review of his note showed area measured 4cm.  Having BMD done with Dr. Waynard Edwards.    Denies vaginal bleeding.  H/o lichen sclerosus.  Has gotten a little confused about what prescription to use on vulva.  Had minimal itching.  Has mometasone rx and does not need RF.  Aware this is what should be used.  No LMP recorded. Patient is postmenopausal.           Health Maintenance: PCP:  Dr. Waynard Edwards.  Does screening lab work with him. Vaccines are up to date:  yes Colonoscopy:  was advised she did not need for  MMG:  unilateral due 01/2023 and then again4/2024 BMD:  does with Dr. Waynard Edwards Last pap smear:  07/2021.   H/o abnormal pap smear:  h/o CIN 1   reports that she has never smoked. She has never been exposed to tobacco smoke. She has never used smokeless tobacco. She reports current alcohol use of about 4.0 - 6.0 standard  drinks of alcohol per week. She reports that she does not use drugs.  Past Medical History:  Diagnosis Date   Arthritis    osteoarthritis   Breast cancer 11/2021   CAD (coronary artery disease)    Descending thoracic aortic dissection    followed by Dr. Myra Gianotti (11/24/21)   Diverticulitis 2022   Dysplasia of cervix, low grade (CIN 1) 1990   HPV, Cryo, Laser   Fibroadenoma    Left, at 5 o'clock, not excised    Fibroid 2002   1 cm, 2 cm   GERD (gastroesophageal reflux disease)    Hypercholesteremia    Hypertension    Hyperthyroidism    PAF (paroxysmal atrial fibrillation)    on Eliquis   PMR (polymyalgia rheumatica)    Pneumonia    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   s/p TAVR with a 23 mm Edwards S3UR via the TF approach by Dr. Clifton James & Dr. Laneta Simmers .   Severe aortic stenosis     Past Surgical History:  Procedure Laterality Date   AXILLARY SENTINEL NODE BIOPSY Left 12/29/2021   Procedure: LEFT AXILLARY SENTINEL NODE BIOPSY;  Surgeon: Emelia Loron, MD;  Location: [facility] OR;  Service: General;  Laterality: Left;   BREAST BIOPSY Bilateral 1970's   x2 one a side   BREAST LUMPECTOMY WITH RADIOACTIVE SEED LOCALIZATION Left 12/29/2021  Procedure: BREAST LUMPECTOMY WITH RADIOACTIVE SEED LOCALIZATION X2;  Surgeon: Emelia Loron, MD;  Location: [facility] OR;  Service: General;  Laterality: Left;   CATARACT EXTRACTION     bilateral   implantable loop recorder removal  05/30/2021   MDT reveal LINQ removed by Dr Johney Frame   INTRAOPERATIVE TRANSTHORACIC ECHOCARDIOGRAM N/A 08/19/2021   Procedure: INTRAOPERATIVE TRANSTHORACIC ECHOCARDIOGRAM;  Surgeon: Kathleene Hazel, MD;  Location: [facility] OR;  Service: Open Heart Surgery;  Laterality: N/A;   LOOP RECORDER INSERTION N/A 08/11/2017   Procedure: LOOP RECORDER INSERTION;  Surgeon: Hillis Range, MD;  Location: [facility] INVASIVE CV LAB;  Service: Cardiovascular;  Laterality: N/A;   RADIOACTIVE SEED GUIDED AXILLARY SENTINEL  LYMPH NODE Left 12/29/2021   Procedure: LEFT AXILLARY NODE SEED GUIDED EXCISION;  Surgeon: Emelia Loron, MD;  Location: [facility] OR;  Service: General;  Laterality: Left;   RETINAL DETACHMENT SURGERY  2000   RIGHT/LEFT HEART CATH AND CORONARY ANGIOGRAPHY N/A 06/19/2021   Procedure: RIGHT/LEFT HEART CATH AND CORONARY ANGIOGRAPHY;  Surgeon: Lyn Records, MD;  Location: [facility] INVASIVE CV LAB;  Service: Cardiovascular;  Laterality: N/A;   THYROIDECTOMY  1967   TONSILLECTOMY     TOTAL KNEE ARTHROPLASTY  04/25/2012   Procedure: TOTAL KNEE BILATERAL;  Surgeon: Loanne Drilling, MD;  Location: [facility] ORS;  Service: Orthopedics;  Laterality: Bilateral;   TRANSCATHETER AORTIC VALVE REPLACEMENT, TRANSFEMORAL N/A 08/19/2021   Procedure: Transcatheter Aortic Valve Replacement, Transfemoral Using 23mm Sapien 3 Ultra Edwards Valve;  Surgeon: Kathleene Hazel, MD;  Location: [facility] OR;  Service: Open Heart Surgery;  Laterality: N/A;    Current Outpatient Medications  Medication Sig Dispense Refill   acetaminophen (TYLENOL) 500 MG tablet Take 1,000 mg by mouth every 6 (six) hours as needed for moderate pain.     apixaban (ELIQUIS) 5 MG TABS tablet Take 1 tablet (5 mg total) by mouth 2 (two) times daily. 60 tablet 11   b complex vitamins capsule Take 1 capsule by mouth daily.     Calcium Carb-Cholecalciferol (CALCIUM + D3 PO) Take 1 tablet by mouth in the morning.     cholecalciferol (VITAMIN D3) 25 MCG (1000 UNIT) tablet Take 1,000 Units by mouth in the morning.     dapagliflozin propanediol (FARXIGA) 10 MG TABS tablet Take 1 tablet (10 mg total) by mouth daily before breakfast. 30 tablet 11   denosumab (PROLIA) 60 MG/ML SOSY injection Inject 60 mg into the skin every 6 (six) months.     dicyclomine (BENTYL) 10 MG capsule Take 10-20 mg by mouth 3 (three) times daily as needed for spasms.     folic acid (FOLVITE) 1 MG tablet Take 2 mg by mouth in the morning.     letrozole (FEMARA) 2.5 MG tablet TAKE 1 TABLET(2.5  MG) BY MOUTH DAILY 90 tablet 3   levothyroxine (SYNTHROID) 100 MCG tablet Take 100 mcg by mouth daily before breakfast.     loperamide (IMODIUM) 2 MG capsule Take 2-4 mg by mouth 4 (four) times daily as needed (upset stomach/diarrhea/loose stools).     metoprolol succinate (TOPROL XL) 25 MG 24 hr tablet Take 1 tablet (25 mg total) by mouth daily. 90 tablet 3   nitroGLYCERIN (NITROSTAT) 0.4 MG SL tablet Place 1 tablet (0.4 mg total) under the tongue every 5 (five) minutes as needed for chest pain. 25 tablet 3   REPATHA SURECLICK 140 MG/ML SOAJ Inject 140 mg into the skin every 14 (fourteen) days.     sacubitril-valsartan (ENTRESTO) 49-51 MG Take 1  tablet by mouth 2 (two) times daily. 60 tablet 3   traMADol (ULTRAM) 50 MG tablet Take 1 tablet (50 mg total) by mouth every 6 (six) hours as needed. 10 tablet 0   zolpidem (AMBIEN) 5 MG tablet Take 2.5 mg by mouth at bedtime.     No current facility-administered medications for this visit.    Family History  Problem Relation Age of Onset   Aortic aneurysm Mother    Clotting disorder Mother    Healthy Father    Heart disease Brother        stints   Breast cancer Maternal Aunt 34   Breast cancer Maternal Grandmother 36   Heart attack Maternal Grandmother     Review of Systems  Constitutional: Negative.   Genitourinary: Negative.     Exam:   BP (!) 140/70 (BP Location: Right Arm, Patient Position: Sitting, Cuff Size: Large)   Pulse 63   Ht 5' 5.5" (1.664 m) Comment: Reported  Wt 162 lb 9.6 oz (73.8 kg)   BMI 26.65 kg/m   Height: 5' 5.5" (166.4 cm) (Reported)  General appearance: alert, cooperative and appears stated age Breasts:  right breast without masses, skin changes, LAD or nipple changes; left breast with 5.5cm nodular mass in right UOQ in sitting position, measures 4.0cm on exam in supine position, no LAD or nipple discharge seen Abdomen: soft, non-tender; bowel sounds normal; no masses,  no organomegaly Lymph nodes: Cervical,  supraclavicular, and axillary nodes normal.  No abnormal inguinal nodes palpated Neurologic: Grossly normal  Pelvic: External genitalia:  no lesions              Urethra:  normal appearing urethra with no masses, tenderness or lesions              Bartholins and Skenes: normal                 Vagina: normal appearing vagina with atrophic changes and no discharge, no lesions              Cervix: no lesions              Pap taken: No. Bimanual Exam:  Uterus:  normal size, contour, position, consistency, mobility, non-tender              Adnexa: no mass, fullness, tenderness               Rectovaginal: Confirms               Anus:  normal sphincter tone, no lesions  Chaperone, Ina Homes, CMA, was present for exam.  Assessment/Plan: 1. Encounter for gynecologic examination for high-risk patient covered by Medicare - Pap smear neg 2023 - Mammogram unilateral left due in 6 months and then bilateral in 12 months - Colonoscopy 2021 and no additional screening recommended - Bone mineral density is done with Dr. Waynard Edwards, per pt - lab work done with PCP, Dr - vaccines reviewed/updated  2. Senile osteoporosis  3. Dysplasia of cervix, low grade (CIN 1) - history of dysplasia - pap last year normal  4. Malignant neoplasm of lower-outer quadrant of left breast of female, estrogen receptor positive - followed by Dr. Dwain Sarna and Dr. Pamelia Hoit  5. Mass of upper outer quadrant of left breast - this does seem stable compared to Dr. Doreen Salvage exam.  Given hx of hematoma and radiation, findings on exam seem appropriate.  Do not see/feel findings concerning for recurrence  6. Lichen sclerosus - does not  need RF for mometasone ointment at this time.  She is advised to use this regularly and twice weekly.  Will let me know when needs RF.

## 2022-07-31 ENCOUNTER — Encounter (HOSPITAL_COMMUNITY): Payer: Medicare Other

## 2022-07-31 ENCOUNTER — Encounter (HOSPITAL_COMMUNITY)
Admission: RE | Admit: 2022-07-31 | Discharge: 2022-07-31 | Disposition: A | Discharge status: Home / Self Care | Payer: Medicare Other | Source: Ambulatory Visit | Attending: Internal Medicine | Admitting: Internal Medicine

## 2022-07-31 DIAGNOSIS — Z952 Presence of prosthetic heart valve: Secondary | ICD-10-CM | POA: Diagnosis not present

## 2022-07-31 DIAGNOSIS — I5032 Chronic diastolic (congestive) heart failure: Secondary | ICD-10-CM | POA: Diagnosis not present

## 2022-07-31 DIAGNOSIS — Z48812 Encounter for surgical aftercare following surgery on the circulatory system: Secondary | ICD-10-CM | POA: Diagnosis not present

## 2022-07-31 DIAGNOSIS — I110 Hypertensive heart disease with heart failure: Secondary | ICD-10-CM | POA: Diagnosis not present

## 2022-07-31 DIAGNOSIS — I480 Paroxysmal atrial fibrillation: Secondary | ICD-10-CM | POA: Diagnosis not present

## 2022-07-31 DIAGNOSIS — Z79899 Other long term (current) drug therapy: Secondary | ICD-10-CM | POA: Diagnosis not present

## 2022-08-03 ENCOUNTER — Encounter (HOSPITAL_COMMUNITY): Payer: Medicare Other

## 2022-08-03 ENCOUNTER — Encounter (HOSPITAL_COMMUNITY)
Admission: RE | Admit: 2022-08-03 | Discharge: 2022-08-03 | Disposition: A | Discharge status: Home / Self Care | Payer: Medicare Other | Source: Ambulatory Visit | Attending: Internal Medicine | Admitting: Internal Medicine

## 2022-08-03 DIAGNOSIS — I110 Hypertensive heart disease with heart failure: Secondary | ICD-10-CM | POA: Diagnosis not present

## 2022-08-03 DIAGNOSIS — Z952 Presence of prosthetic heart valve: Secondary | ICD-10-CM

## 2022-08-03 DIAGNOSIS — Z79899 Other long term (current) drug therapy: Secondary | ICD-10-CM | POA: Diagnosis not present

## 2022-08-03 DIAGNOSIS — Z48812 Encounter for surgical aftercare following surgery on the circulatory system: Secondary | ICD-10-CM | POA: Diagnosis not present

## 2022-08-03 DIAGNOSIS — I480 Paroxysmal atrial fibrillation: Secondary | ICD-10-CM | POA: Diagnosis not present

## 2022-08-03 DIAGNOSIS — I5032 Chronic diastolic (congestive) heart failure: Secondary | ICD-10-CM | POA: Diagnosis not present

## 2022-08-05 ENCOUNTER — Encounter (HOSPITAL_COMMUNITY)
Admission: RE | Admit: 2022-08-05 | Discharge: 2022-08-05 | Disposition: A | Discharge status: Home / Self Care | Payer: Medicare Other | Source: Ambulatory Visit | Attending: Internal Medicine | Admitting: Internal Medicine

## 2022-08-05 ENCOUNTER — Encounter (HOSPITAL_COMMUNITY): Payer: Medicare Other

## 2022-08-05 DIAGNOSIS — Z48812 Encounter for surgical aftercare following surgery on the circulatory system: Secondary | ICD-10-CM | POA: Diagnosis not present

## 2022-08-05 DIAGNOSIS — Z952 Presence of prosthetic heart valve: Secondary | ICD-10-CM

## 2022-08-05 DIAGNOSIS — I480 Paroxysmal atrial fibrillation: Secondary | ICD-10-CM | POA: Diagnosis not present

## 2022-08-05 DIAGNOSIS — Z79899 Other long term (current) drug therapy: Secondary | ICD-10-CM | POA: Diagnosis not present

## 2022-08-05 DIAGNOSIS — I5032 Chronic diastolic (congestive) heart failure: Secondary | ICD-10-CM | POA: Diagnosis not present

## 2022-08-05 DIAGNOSIS — I110 Hypertensive heart disease with heart failure: Secondary | ICD-10-CM | POA: Diagnosis not present

## 2022-08-07 ENCOUNTER — Encounter (HOSPITAL_COMMUNITY): Payer: Medicare Other

## 2022-08-07 ENCOUNTER — Encounter (HOSPITAL_COMMUNITY)
Admission: RE | Admit: 2022-08-07 | Discharge: 2022-08-07 | Disposition: A | Discharge status: Home / Self Care | Payer: Medicare Other | Source: Ambulatory Visit | Attending: Internal Medicine | Admitting: Internal Medicine

## 2022-08-07 DIAGNOSIS — I110 Hypertensive heart disease with heart failure: Secondary | ICD-10-CM | POA: Diagnosis not present

## 2022-08-07 DIAGNOSIS — I5032 Chronic diastolic (congestive) heart failure: Secondary | ICD-10-CM | POA: Diagnosis not present

## 2022-08-07 DIAGNOSIS — Z952 Presence of prosthetic heart valve: Secondary | ICD-10-CM | POA: Diagnosis not present

## 2022-08-07 DIAGNOSIS — I480 Paroxysmal atrial fibrillation: Secondary | ICD-10-CM | POA: Diagnosis not present

## 2022-08-07 DIAGNOSIS — Z79899 Other long term (current) drug therapy: Secondary | ICD-10-CM | POA: Diagnosis not present

## 2022-08-07 DIAGNOSIS — Z48812 Encounter for surgical aftercare following surgery on the circulatory system: Secondary | ICD-10-CM | POA: Diagnosis not present

## 2022-08-10 ENCOUNTER — Encounter (HOSPITAL_COMMUNITY): Payer: Medicare Other

## 2022-08-10 ENCOUNTER — Encounter (HOSPITAL_COMMUNITY)
Admission: RE | Admit: 2022-08-10 | Discharge: 2022-08-10 | Disposition: A | Discharge status: Home / Self Care | Payer: Medicare Other | Source: Ambulatory Visit | Attending: Internal Medicine | Admitting: Internal Medicine

## 2022-08-10 DIAGNOSIS — M8580 Other specified disorders of bone density and structure, unspecified site: Secondary | ICD-10-CM | POA: Diagnosis not present

## 2022-08-10 DIAGNOSIS — I5032 Chronic diastolic (congestive) heart failure: Secondary | ICD-10-CM | POA: Diagnosis not present

## 2022-08-10 DIAGNOSIS — I110 Hypertensive heart disease with heart failure: Secondary | ICD-10-CM | POA: Diagnosis not present

## 2022-08-10 DIAGNOSIS — Z48812 Encounter for surgical aftercare following surgery on the circulatory system: Secondary | ICD-10-CM | POA: Diagnosis not present

## 2022-08-10 DIAGNOSIS — I480 Paroxysmal atrial fibrillation: Secondary | ICD-10-CM | POA: Diagnosis not present

## 2022-08-10 DIAGNOSIS — M316 Other giant cell arteritis: Secondary | ICD-10-CM | POA: Diagnosis not present

## 2022-08-10 DIAGNOSIS — M353 Polymyalgia rheumatica: Secondary | ICD-10-CM | POA: Diagnosis not present

## 2022-08-10 DIAGNOSIS — Z79899 Other long term (current) drug therapy: Secondary | ICD-10-CM | POA: Diagnosis not present

## 2022-08-10 DIAGNOSIS — Z952 Presence of prosthetic heart valve: Secondary | ICD-10-CM

## 2022-08-10 DIAGNOSIS — Z7952 Long term (current) use of systemic steroids: Secondary | ICD-10-CM | POA: Diagnosis not present

## 2022-08-10 DIAGNOSIS — E663 Overweight: Secondary | ICD-10-CM | POA: Diagnosis not present

## 2022-08-10 DIAGNOSIS — M549 Dorsalgia, unspecified: Secondary | ICD-10-CM | POA: Diagnosis not present

## 2022-08-10 DIAGNOSIS — Z6826 Body mass index (BMI) 26.0-26.9, adult: Secondary | ICD-10-CM | POA: Diagnosis not present

## 2022-08-12 ENCOUNTER — Encounter (HOSPITAL_COMMUNITY): Payer: Medicare Other

## 2022-08-12 ENCOUNTER — Encounter (HOSPITAL_COMMUNITY)
Admission: RE | Admit: 2022-08-12 | Discharge: 2022-08-12 | Disposition: A | Discharge status: Home / Self Care | Payer: Medicare Other | Source: Ambulatory Visit | Attending: Internal Medicine | Admitting: Internal Medicine

## 2022-08-12 DIAGNOSIS — Z952 Presence of prosthetic heart valve: Secondary | ICD-10-CM | POA: Diagnosis not present

## 2022-08-13 ENCOUNTER — Ambulatory Visit (HOSPITAL_COMMUNITY): Payer: Medicare Other | Attending: Internal Medicine

## 2022-08-13 DIAGNOSIS — I5032 Chronic diastolic (congestive) heart failure: Secondary | ICD-10-CM | POA: Diagnosis not present

## 2022-08-14 ENCOUNTER — Encounter (HOSPITAL_COMMUNITY): Payer: Medicare Other

## 2022-08-14 ENCOUNTER — Encounter (HOSPITAL_COMMUNITY)
Admission: RE | Admit: 2022-08-14 | Discharge: 2022-08-14 | Disposition: A | Discharge status: Home / Self Care | Payer: Medicare Other | Source: Ambulatory Visit | Attending: Internal Medicine | Admitting: Internal Medicine

## 2022-08-14 DIAGNOSIS — Z952 Presence of prosthetic heart valve: Secondary | ICD-10-CM | POA: Diagnosis not present

## 2022-08-17 ENCOUNTER — Encounter (HOSPITAL_COMMUNITY): Payer: Medicare Other

## 2022-08-17 ENCOUNTER — Ambulatory Visit (HOSPITAL_COMMUNITY)
Admission: RE | Admit: 2022-08-17 | Discharge: 2022-08-17 | Disposition: A | Discharge status: Home / Self Care | Payer: Medicare Other | Source: Ambulatory Visit | Attending: Internal Medicine | Admitting: Internal Medicine

## 2022-08-17 ENCOUNTER — Encounter (HOSPITAL_COMMUNITY)
Admission: RE | Admit: 2022-08-17 | Discharge: 2022-08-17 | Disposition: A | Discharge status: Home / Self Care | Payer: Medicare Other | Source: Ambulatory Visit | Attending: Internal Medicine | Admitting: Internal Medicine

## 2022-08-17 DIAGNOSIS — R0609 Other forms of dyspnea: Secondary | ICD-10-CM | POA: Insufficient documentation

## 2022-08-17 DIAGNOSIS — I5032 Chronic diastolic (congestive) heart failure: Secondary | ICD-10-CM | POA: Diagnosis not present

## 2022-08-17 DIAGNOSIS — Z952 Presence of prosthetic heart valve: Secondary | ICD-10-CM

## 2022-08-17 LAB — PULMONARY FUNCTION TEST
DL/VA % pred: 83 %
DL/VA: 3.37 ml/min/mmHg/L
DLCO unc % pred: 71 %
DLCO unc: 14.16 ml/min/mmHg
FEF 25-75 Pre: 2.45 L/sec
FEF2575-%Pred-Pre: 169 %
FEV1-%Pred-Pre: 105 %
FEV1-Pre: 2.17 L
FEV1FVC-%Pred-Pre: 111 %
FEV6-%Pred-Pre: 100 %
FEV6-Pre: 2.62 L
FEV6FVC-%Pred-Pre: 105 %
FVC-%Pred-Pre: 96 %
FVC-Pre: 2.65 L
Pre FEV1/FVC ratio: 82 %
Pre FEV6/FVC Ratio: 100 %
RV % pred: 80 %
RV: 2 L
TLC % pred: 93 %
TLC: 4.95 L

## 2022-08-18 NOTE — Progress Notes (Signed)
[facility]   MULTIDISCIPLINARY HEART VALVE CLINIC                                     Cardiology Office Note:    Date:  08/20/2022   ID:  Marissa Powers, DOB 1941-01-12, MRN [id number]  PCP:  Rodrigo Ran, MD  [facility] Cardiologist:  None  / Dr. Clifton James & Dr. Laneta Simmers (TAVR) [facility] Electrophysiologist:  None   Referring MD: Rodrigo Ran, MD   1 year s/p TAVR  History of Present Illness:    Marissa Powers is a 82 y.o. 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   She has a history of aortic stenosis followed by Dr. Katrinka Blazing. This progressed to severe by echo in 05/2021. L/RHC on 06/19/21 confirmed severe aortic stenosis with mean transvalvular gradient 50.3 mmHg, and calculated aortic valve area 0.89 cm as well as widely patent left main, LAD, circumflex, and 70 to 80% stenosis of the mid RCA. Medical therapy was recommended.     She was evaluated by the multidisciplinary valve team and underwent successful TAVR with a 23 mm Edwards Sapien 3 Ultra Resilia THV via the TF approach on 08/19/21. Post operative echo showed EF 60%, normally functioning TAVR with a mean gradient of 17 mmHg and no PVL.There was some degree of patient prosthesis mismatch. Noted to have a new LBBB after TAVR. Follow up monitor was unremarkable. She was continued on home Eliquis 5mg  BID. 1 month echo showed 1 month s/p TAVR: EF 55%, normally functioning TAVR with a mean gradient of 9.8 mm hg and no PVL.   Diagnosed with Ib breast CA s/p lumpectomy and XRT 02/06/2022-03/26/2022 as well as  letrozole.   [place] CT scan for diverticulitis that showed a dissection. 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 This is followed by Dr. Myra Gianotti. Plan for conservative therapy and serial CTs.  Recently established care with Dr. Gala Romney for evaluation of diastolic CHF and NYHA class III symptoms. He felt her dyspnea might be more age-related changes (and her comparing herself to 10 years ago and not to her peers) than a pathologic process particularly in light of her very high baseline activity level. CPX testing and PFT/DLCO/CXR arranged. Amiodarone discontinued. [employer] changed to [employer].   CPX 08/13/22 showed excellent functional capacity. There were possible elevated pulmonary pressures but could have been confounded by end exercise hyperventilation. If symptoms persist, can consider repeat exercise testing with invasive hemodynamic monitoring. (Exercise right heart cath).   Today the patient presents to clinic for follow up. Here with her friend Johnny Bridge. Have several questions about recent CPX. Shortness of breath is unchanged and wants to know next steps. No CP. No LE edema, orthopnea or PND. No dizziness or syncope. No blood in stool or urine. No palpitations. Remains very active.    Past Medical History:  Diagnosis Date   Arthritis    osteoarthritis   Breast cancer (HCC) 11/2021   CAD (coronary artery disease)  Descending thoracic aortic dissection (HCC)    followed by Dr. Myra Gianotti (11/24/21)   Diverticulitis 2022   Dysplasia of cervix, low grade (CIN 1) 1990   HPV, Cryo, Laser   Fibroadenoma    Left, at 5 o'clock, not excised    Fibroid 2002   1 cm, 2 cm   GERD (gastroesophageal reflux disease)    Hypercholesteremia    Hypertension    Hyperthyroidism    PAF (paroxysmal atrial fibrillation) (HCC)    on Eliquis   PMR (polymyalgia rheumatica) (HCC)    Pneumonia    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   s/p  TAVR with a 23 mm Edwards S3UR via the TF approach by Dr. Clifton James & Dr. Laneta Simmers .   Severe aortic stenosis     Past Surgical History:  Procedure Laterality Date   AXILLARY SENTINEL NODE BIOPSY Left 12/29/2021   Procedure: LEFT AXILLARY SENTINEL NODE BIOPSY;  Surgeon: Emelia Loron, MD;  Location: [facility] OR;  Service: General;  Laterality: Left;   BREAST BIOPSY Bilateral 1970's   x2 one a side   BREAST LUMPECTOMY WITH RADIOACTIVE SEED LOCALIZATION Left 12/29/2021   Procedure: BREAST LUMPECTOMY WITH RADIOACTIVE SEED LOCALIZATION X2;  Surgeon: Emelia Loron, MD;  Location: [facility] OR;  Service: General;  Laterality: Left;   CATARACT EXTRACTION     bilateral   implantable loop recorder removal  05/30/2021   MDT reveal LINQ removed by Dr Johney Frame   INTRAOPERATIVE TRANSTHORACIC ECHOCARDIOGRAM N/A 08/19/2021   Procedure: INTRAOPERATIVE TRANSTHORACIC ECHOCARDIOGRAM;  Surgeon: Kathleene Hazel, MD;  Location: [facility] OR;  Service: Open Heart Surgery;  Laterality: N/A;   LOOP RECORDER INSERTION N/A 08/11/2017   Procedure: LOOP RECORDER INSERTION;  Surgeon: Hillis Range, MD;  Location: [facility] INVASIVE CV LAB;  Service: Cardiovascular;  Laterality: N/A;   RADIOACTIVE SEED GUIDED AXILLARY SENTINEL LYMPH NODE Left 12/29/2021   Procedure: LEFT AXILLARY NODE SEED GUIDED EXCISION;  Surgeon: Emelia Loron, MD;  Location: [facility] OR;  Service: General;  Laterality: Left;   RETINAL DETACHMENT SURGERY  2000   RIGHT/LEFT HEART CATH AND CORONARY ANGIOGRAPHY N/A 06/19/2021   Procedure: RIGHT/LEFT HEART CATH AND CORONARY ANGIOGRAPHY;  Surgeon: Lyn Records, MD;  Location: [facility] INVASIVE CV LAB;  Service: Cardiovascular;  Laterality: N/A;   THYROIDECTOMY  1967   TONSILLECTOMY     TOTAL KNEE ARTHROPLASTY  04/25/2012   Procedure: TOTAL KNEE BILATERAL;  Surgeon: Loanne Drilling, MD;  Location: [facility] ORS;  Service: Orthopedics;  Laterality: Bilateral;   TRANSCATHETER AORTIC VALVE REPLACEMENT, TRANSFEMORAL N/A 08/19/2021    Procedure: Transcatheter Aortic Valve Replacement, Transfemoral Using 23mm Sapien 3 Ultra Edwards Valve;  Surgeon: Kathleene Hazel, MD;  Location: [facility] OR;  Service: Open Heart Surgery;  Laterality: N/A;    Current Medications: Current Meds  Medication Sig   acetaminophen (TYLENOL) 500 MG tablet Take 1,000 mg by mouth every 6 (six) hours as needed for moderate pain.   apixaban (ELIQUIS) 5 MG TABS tablet Take 1 tablet (5 mg total) by mouth 2 (two) times daily.   b complex vitamins capsule Take 1 capsule by mouth daily.   Calcium Carb-Cholecalciferol (CALCIUM + D3 PO) Take 1 tablet by mouth in the morning.   cholecalciferol (VITAMIN D3) 25 MCG (1000 UNIT) tablet Take 1,000 Units by mouth in the morning.   dapagliflozin propanediol (FARXIGA) 10 MG TABS tablet Take 1 tablet (10 mg total) by mouth daily before breakfast.   denosumab (PROLIA) 60 MG/ML SOSY injection Inject 60 mg into the  skin every 6 (six) months.   dicyclomine (BENTYL) 10 MG capsule Take 10-20 mg by mouth 3 (three) times daily as needed for spasms.   folic acid (FOLVITE) 1 MG tablet Take 2 mg by mouth in the morning.   letrozole (FEMARA) 2.5 MG tablet TAKE 1 TABLET(2.5 MG) BY MOUTH DAILY   levothyroxine (SYNTHROID) 100 MCG tablet Take 100 mcg by mouth daily before breakfast.   loperamide (IMODIUM) 2 MG capsule Take 2-4 mg by mouth 4 (four) times daily as needed (upset stomach/diarrhea/loose stools).   metoprolol succinate (TOPROL XL) 25 MG 24 hr tablet Take 1 tablet (25 mg total) by mouth daily.   nitroGLYCERIN (NITROSTAT) 0.4 MG SL tablet Place 1 tablet (0.4 mg total) under the tongue every 5 (five) minutes as needed for chest pain.   REPATHA SURECLICK 140 MG/ML SOAJ Inject 140 mg into the skin every 14 (fourteen) days.   sacubitril-valsartan (ENTRESTO) 49-51 MG Take 1 tablet by mouth 2 (two) times daily.   traMADol (ULTRAM) 50 MG tablet Take 1 tablet (50 mg total) by mouth every 6 (six) hours as needed.   zolpidem (AMBIEN)  5 MG tablet Take 2.5 mg by mouth at bedtime.     Allergies:   Cashew nut oil, Cashew nut (anacardium occidentale) skin test, and Other   Social History   Socioeconomic History   Marital status: Widowed    Spouse name: Not on file   Number of children: 2   Years of education: Not on file   Highest education level: Not on file  Occupational History   Not on file  Tobacco Use   Smoking status: Never    Passive exposure: Never   Smokeless tobacco: Never  Vaping Use   Vaping Use: Never used  Substance and Sexual Activity   Alcohol use: Yes    Alcohol/week: 4.0 - 6.0 standard drinks of alcohol    Types: 4 - 6 Standard drinks or equivalent per week   Drug use: No   Sexual activity: Not Currently    Partners: Male    Birth control/protection: Post-menopausal  Other Topics Concern   Not on file  Social History Narrative   Not on file   Social Determinants of Health   Financial Resource Strain: Low Risk  (08/05/2021)   Overall Financial Resource Strain (CARDIA)    Difficulty of Paying Living Expenses: Not hard at all  Food Insecurity: No Food Insecurity (08/05/2021)   Hunger Vital Sign    Worried About Running Out of Food in the Last Year: Never true    Ran Out of Food in the Last Year: Never true  Transportation Needs: No Transportation Needs (08/05/2021)   PRAPARE - Administrator, Civil Service (Medical): No    Lack of Transportation (Non-Medical): No  Physical Activity: Not on file  Stress: Not on file  Social Connections: Not on file     Family History: The patient's family history includes Aortic aneurysm in her mother; Breast cancer (age of onset: 44) in her maternal aunt; Breast cancer (age of onset: 58) in her maternal grandmother; Clotting disorder in her mother; Healthy in her father; Heart attack in her maternal grandmother; Heart disease in her brother.  ROS:   Please see the history of present illness.    All other systems reviewed and are  negative.  EKGs/Labs/Other Studies Reviewed:    The following studies were reviewed today:  TAVR OPERATIVE NOTE     Date of Procedure:  08/19/2021   Preoperative Diagnosis:      Severe Aortic Stenosis    Postoperative Diagnosis:    Same    Procedure:        Transcatheter Aortic Valve Replacement - Transfemoral Approach             Edwards Sapien 3 THV (size 23 mm, model # [id number] serial # [id number])              Co-Surgeons:                        Verne Carrow, MD and Alleen Borne, MD   Anesthesiologist:                  Stoltzfus   Echocardiographer:              Izora Ribas   Pre-operative Echo Findings: Severe aortic stenosis Normal left ventricular systolic function   Post-operative Echo Findings: No paravalvular leak Normal left ventricular systolic function _____________     Echo 08/20/21:  IMPRESSIONS   1. The aortic valve has been replaced by a 23 mm Edwards Valve. Aortic  valve regurgitation is not visualized (neither central nor paravalvular).  Mean gradient of 17 mm Hg, Peak gradient 29 mm Hg. Poor LVOT Doppler  acquition precludes DVI or EOA  assessment. Acceleration time 98 ms. Suspect degree of patient prosthesis  mismatch.   2. Left ventricular ejection fraction, by estimation, is 60 to 65%. The  left ventricle has normal function. The left ventricle has no regional  wall motion abnormalities. There is moderate concentric left ventricular  hypertrophy. Left ventricular  diastolic parameters are consistent with Grade I diastolic dysfunction  (impaired relaxation).   3. Right ventricular systolic function is normal. The right ventricular  size is normal. There is normal pulmonary artery systolic pressure. The  estimated right ventricular systolic pressure is 29.0 mmHg.   4. Left atrial size was mildly dilated.   5. A small pericardial effusion is present. The pericardial effusion is  anterior to the right ventricle.   6.  The mitral valve is normal in structure. No evidence of mitral valve  regurgitation. No evidence of mitral stenosis.   7. The inferior vena cava is normal in size with greater than 50%  respiratory variability, suggesting right atrial pressure of 3 mmHg.   Comparison(s): Elevated gradients from prior without evidence of  prosthetic valve thrombosis.   Conclusion(s)/Recommendation(s): In next study, careful LVOT gradient  assessment is needed.   ________________________  Echo 09/17/21 IMPRESSIONS   1. The aortic valve has been repaired/replaced. Aortic valve regurgitation is not visualized. There is a 23 mm Sapien prosthetic (TAVR) valve present in the aortic position. Procedure Date: 08/19/2021. Echo findings are consistent with normal structure  and function of the aortic valve prosthesis. Aortic valve area, by VTI measures 1.75 cm. Aortic valve mean gradient measures 9.8 mmHg. Aortic valve Vmax measures 2.08 m/s.  2. Left ventricular ejection fraction, by estimation, is 55 to 60%. The left ventricle has normal function. The left ventricle has no regional wall motion abnormalities. Left ventricular diastolic parameters are consistent with Grade II diastolic  dysfunction (pseudonormalization).  3. Right ventricular systolic function is normal. The right ventricular size is normal.  4. The mitral valve is grossly normal. Trivial mitral valve regurgitation. No evidence of mitral stenosis.  5. The inferior vena cava is normal in size with greater than 50% respiratory variability, suggesting right atrial pressure of 3  mmHg.   Comparison(s): No significant change from prior study. Normal prosthesis.  ______________________  Echo 08/19/22 IMPRESSIONS   1. Left ventricular ejection fraction, by estimation, is 60 to 65%. The  left ventricle has normal function. The left ventricle has no regional  wall motion abnormalities. Left ventricular diastolic parameters are  indeterminate.   2. Right  ventricular systolic function is normal. The right ventricular  size is normal. There is normal pulmonary artery systolic pressure. The  estimated right ventricular systolic pressure is 24.2 mmHg.   3. The mitral valve is normal in structure. Trivial mitral valve  regurgitation.   4. The aortic valve has been repaired/replaced. Aortic valve  regurgitation is not visualized. There is a 23 mm Edwards Sapien  prosthetic (TAVR) valve present in the aortic position. Echo findings are  consistent with normal structure and function of the  aortic valve prosthesis. Aortic valve mean gradient measures 12.0 mmHg.   5. Aortic dilatation noted. There is dilatation of the ascending aorta,  measuring 40 mm.   6. The inferior vena cava is normal in size with greater than 50%  respiratory variability, suggesting right atrial pressure of 3 mmHg.    _______________________  CPX 08/13/22 Conclusion: Exercise testing with gas exchange demonstrates normal (actually excellent) functional capacity when compared to matched sedentary norms. There is no clear evidence of cardiopulmonary abnormality, however cannot be completely excluded with severely elevated VE/VCO2 slope. VE/VCO2 slope can partially be explain by some degree of end-exercise hyperventilation but cannot be assumed for full extent of elevated slope and should consider a mild (at least) HF limitation playing a role.    Test, report and preliminary impression by:  Reggy Eye, MS, ACSM-RCEP  08/13/2022 3:46 PM   Attending: Excellent functional capacity compared to matched subjects. The VE/VCO2 is markedly elevated and can be indicative of elevated pulmonary pressures during exercise. However given the low PETCO2,, the measured VE/VCO2 may be confounded by end exercise hyperventilation. If symptoms persist, can consider repeat exercise testing with invasive hemodynamic monitoring. (Exercise right heart cath).    EKG:  EKG is NOT ordered today.     Recent Labs: 12/25/2021: Hemoglobin 11.3; Platelets 219 03/20/2022: BUN 12; Creatinine, Ser 0.84; Potassium 4.1; Sodium 141  Recent Lipid Panel    Component Value Date/Time   CHOL 122 10/28/2018 0832   TRIG 100 07/11/2021 0945   HDL 55 10/28/2018 0832   CHOLHDL 2.2 10/28/2018 0832   LDLCALC 45 10/28/2018 0832     Risk Assessment/Calculations:    CHA2DS2-VASc Score =     This indicates a  % annual risk of stroke. The patient's score is based upon:        Physical Exam:    VS:  BP 129/72   Pulse 65   Ht 5' 5.5" (1.664 m)   Wt 162 lb 12.8 oz (73.8 kg)   SpO2 98%   BMI 26.68 kg/m     Wt Readings from Last 3 Encounters:  08/19/22 162 lb 12.8 oz (73.8 kg)  07/30/22 162 lb 9.6 oz (73.8 kg)  07/29/22 164 lb (74.4 kg)     GEN:  Well nourished, well developed in no acute distress HEENT: Normal NECK: No JVD LYMPHATICS: No lymphadenopathy CARDIAC: RRR, no murmurs, rubs, gallops RESPIRATORY:  Clear to auscultation without rales, wheezing or rhonchi  ABDOMEN: Soft, non-tender, non-distended MUSCULOSKELETAL:  No edema; No deformity  SKIN: Warm and dry.   NEUROLOGIC:  Alert and oriented x 3 PSYCHIATRIC:  Normal affect  ASSESSMENT:    1. S/P TAVR (transcatheter aortic valve replacement)   2. PAF (paroxysmal atrial fibrillation) (HCC)   3. 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 Primary hypertension   5. Descending thoracic aortic dissection (HCC)   6. Chronic diastolic HF (heart failure) (HCC)     PLAN:    In order of problems listed above:  Severe AS s/p TAVR: echo today shows EF 60%, normally functioning TAVR with a mean gradient of 12 mm hg and no PVL. She has NYHA class III symptoms, which has been chronic and ongoing. Currently being worked up by Dr. Gala Romney. She has amoxicillin for SBE prophylaxis. Continue on Eliquis 5mg  BID. PRN follow up with structural heart.       PAF: sounds regular on exam.  Continue Eliquis 5mg  BID.    CAD: pre TAVR cath 06/19/21 showed widely patent left main, LAD, circumflex, and 70 to 80% stenosis of the mid RCA. Medical therapy was recommended.    HTN: BP well controlled today. No change made   Descending aortic dissection: followed by Dr. Myra Gianotti.   Chronic diastolic CHF: appears euvolemic. Continue Entreso 49-51mg  daily and Farixga 10mg  daily. Has chronic dyspnea. Dr. Gala Romney felt her dyspnea might be more age-related changes (and her comparing herself to 10 years ago and not to her peers) than a pathologic process particularly in light of her very high baseline activity level. CPX 08/13/22 showed excellent functional capacity. There were possible elevated pulmonary pressures but could have been confounded by end exercise hyperventilation. If symptoms persist, can consider repeat exercise testing with invasive hemodynamic monitoring. (Exercise right heart cath). Will defer to Dr. Gala Romney if further work up indicated.    Medication Adjustments/Labs and Tests Ordered: Current medicines are reviewed at length with the patient today.  Concerns regarding medicines are outlined above.  No orders of the defined types were placed in this encounter.  No orders of the defined types were placed in this encounter.   Patient Instructions  Medication Instructions:  Your physician recommends that you continue on your current medications as directed. Please refer to the Current Medication list given to you today.  *If you need a refill on your cardiac medications before your next appointment, please call your pharmacy*   Lab Work: None ordered  If you have labs (blood work) drawn today and your tests are completely normal, you will receive your results only by: MyChart Message (if you have MyChart) OR A paper copy in the mail If you have any lab test that is abnormal or we need to change your treatment, we will call you to review the  results.   Testing/Procedures: None ordered    Other Instructions     Signed, Cline Crock, PA-C  08/20/2022 10:09 AM    [facility]

## 2022-08-19 ENCOUNTER — Ambulatory Visit (HOSPITAL_BASED_OUTPATIENT_CLINIC_OR_DEPARTMENT_OTHER): Payer: Medicare Other

## 2022-08-19 ENCOUNTER — Encounter (HOSPITAL_COMMUNITY)
Admission: RE | Admit: 2022-08-19 | Discharge: 2022-08-19 | Disposition: A | Discharge status: Home / Self Care | Payer: Medicare Other | Source: Ambulatory Visit | Attending: Internal Medicine | Admitting: Internal Medicine

## 2022-08-19 ENCOUNTER — Ambulatory Visit: Payer: Medicare Other | Attending: Physician Assistant | Admitting: Physician Assistant

## 2022-08-19 VITALS — BP 129/72 | HR 65 | Ht 65.5 in | Wt 162.8 lb

## 2022-08-19 DIAGNOSIS — I71012 Dissection of descending thoracic aorta: Secondary | ICD-10-CM | POA: Diagnosis not present

## 2022-08-19 DIAGNOSIS — I5032 Chronic diastolic (congestive) heart failure: Secondary | ICD-10-CM | POA: Diagnosis not present

## 2022-08-19 DIAGNOSIS — Z952 Presence of prosthetic heart valve: Secondary | ICD-10-CM

## 2022-08-19 DIAGNOSIS — I480 Paroxysmal atrial fibrillation: Secondary | ICD-10-CM | POA: Diagnosis not present

## 2022-08-19 DIAGNOSIS — I25118 Atherosclerotic heart disease of native coronary artery with other forms of angina pectoris: Secondary | ICD-10-CM | POA: Diagnosis not present

## 2022-08-19 DIAGNOSIS — I10 Essential (primary) hypertension: Secondary | ICD-10-CM | POA: Diagnosis not present

## 2022-08-19 LAB — ECHOCARDIOGRAM COMPLETE
AV Mean grad: 12 mmHg
AV Peak grad: 19.9 mmHg
Ao pk vel: 2.23 m/s
Area-P 1/2: 3.45 cm2
S' Lateral: 2.9 cm

## 2022-08-19 NOTE — Progress Notes (Signed)
Cardiac Individual Treatment Plan  Patient Details  Name: Marissa Powers MRN: [id number] Date of Birth: [birth year] Referring Provider:   Flowsheet Row INTENSIVE CARDIAC REHAB ORIENT from 06/09/2022 in [facility]  Referring Provider Bensimhon, Bevelyn Buckles, MD       Initial Encounter Date:  Flowsheet Row INTENSIVE CARDIAC REHAB ORIENT from 06/09/2022 in [facility] for Heart, Vascular, & Lung Health  Date 06/09/22       Visit Diagnosis: 08/19/21 TAVR (transcatheter aortic valve replacement)  Patient's Home Medications on Admission:  Current Outpatient Medications:    acetaminophen (TYLENOL) 500 MG tablet, Take 1,000 mg by mouth every 6 (six) hours as needed for moderate pain., Disp: , Rfl:    apixaban (ELIQUIS) 5 MG TABS tablet, Take 1 tablet (5 mg total) by mouth 2 (two) times daily., Disp: 60 tablet, Rfl: 11   b complex vitamins capsule, Take 1 capsule by mouth daily., Disp: , Rfl:    Calcium Carb-Cholecalciferol (CALCIUM + D3 PO), Take 1 tablet by mouth in the morning., Disp: , Rfl:    cholecalciferol (VITAMIN D3) 25 MCG (1000 UNIT) tablet, Take 1,000 Units by mouth in the morning., Disp: , Rfl:    dapagliflozin propanediol (FARXIGA) 10 MG TABS tablet, Take 1 tablet (10 mg total) by mouth daily before breakfast., Disp: 30 tablet, Rfl: 11   denosumab (PROLIA) 60 MG/ML SOSY injection, Inject 60 mg into the skin every 6 (six) months., Disp: , Rfl:    dicyclomine (BENTYL) 10 MG capsule, Take 10-20 mg by mouth 3 (three) times daily as needed for spasms., Disp: , Rfl:    folic acid (FOLVITE) 1 MG tablet, Take 2 mg by mouth in the morning., Disp: , Rfl:    letrozole (FEMARA) 2.5 MG tablet, TAKE 1 TABLET(2.5 MG) BY MOUTH DAILY, Disp: 90 tablet, Rfl: 3   levothyroxine (SYNTHROID) 100 MCG tablet, Take 100 mcg by mouth daily before breakfast., Disp: , Rfl:    loperamide (IMODIUM) 2 MG capsule, Take 2-4 mg by mouth 4  (four) times daily as needed (upset stomach/diarrhea/loose stools)., Disp: , Rfl:    metoprolol succinate (TOPROL XL) 25 MG 24 hr tablet, Take 1 tablet (25 mg total) by mouth daily., Disp: 90 tablet, Rfl: 3   nitroGLYCERIN (NITROSTAT) 0.4 MG SL tablet, Place 1 tablet (0.4 mg total) under the tongue every 5 (five) minutes as needed for chest pain., Disp: 25 tablet, Rfl: 3   REPATHA SURECLICK 140 MG/ML SOAJ, Inject 140 mg into the skin every 14 (fourteen) days., Disp: , Rfl:    sacubitril-valsartan (ENTRESTO) 49-51 MG, Take 1 tablet by mouth 2 (two) times daily., Disp: 60 tablet, Rfl: 3   traMADol (ULTRAM) 50 MG tablet, Take 1 tablet (50 mg total) by mouth every 6 (six) hours as needed., Disp: 10 tablet, Rfl: 0   zolpidem (AMBIEN) 5 MG tablet, Take 2.5 mg by mouth at bedtime., Disp: , Rfl:   Past Medical History: Past Medical History:  Diagnosis Date   Arthritis    osteoarthritis   Breast cancer (HCC) 11/2021   CAD (coronary artery disease)    Descending thoracic aortic dissection (HCC)    followed by Dr. 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 Clifton James & Dr. Laneta Simmers .   Severe aortic stenosis     Tobacco Use: Social History   Tobacco Use  Smoking Status Never   Passive exposure: Never  Smokeless Tobacco Never    Labs: Review Flowsheet  More data exists      Latest Ref Rng & Units 09/22/2018 10/28/2018 06/19/2021 07/11/2021 08/19/2021  Labs for ITP Cardiac and Pulmonary Rehab  Cholestrol 100 - 199 mg/dL  161  096  - - -  LDL (calc) 0 - 99 mg/dL 045  45  - - -  HDL-C >40 mg/dL 61  55  - - -  Trlycerides <150 mg/dL 71  981  - 191  -  PH, Arterial 7.35 - 7.45 - - 7.457  - -  PCO2 arterial 32 - 48 mmHg - - 33.4  - -  Bicarbonate 20.0 - 28.0 mmol/L 20.0 - 28.0 mmol/L - - 24.5  24.6  23.6  - -  TCO2 22 - 32 mmol/L - - 26  26  25   - 24  22   Acid-base deficit 0.0 - 2.0 mmol/L 0.0 - 2.0 mmol/L - - 1.0  1.0  - -  O2 Saturation % % - - 74  75  99  - -    Capillary Blood Glucose: No results found for: "GLUCAP"   Exercise Target Goals: Exercise Program Goal: Individual exercise prescription set using results from initial 6 min walk test and THRR while considering  patient's activity barriers and safety.   Exercise Prescription Goal: Initial exercise prescription builds to 30-45 minutes a day of aerobic activity, 2-3 days per week.  Home exercise guidelines will be given to patient during program as part of exercise prescription that the participant will acknowledge.  Activity Barriers & Risk Stratification:  Activity Barriers & Cardiac Risk Stratification - 06/09/22 0924       Activity Barriers & Cardiac Risk Stratification   Activity Barriers Arthritis;Left Knee Replacement;Right Knee Replacement;Other (comment)    Comments Polymyalgia rheumatica    Cardiac Risk Stratification High             6 Minute Walk:  6 Minute Walk     Row Name 06/09/22 1015         6 Minute Walk   Phase Initial     Distance 1523 feet     Walk Time 6 minutes     # of Rest Breaks 0     MPH 2.88     METS 2.81     RPE 11     Perceived Dyspnea  1     VO2 Peak 9.85     Symptoms Yes (comment)     Comments Mild SOB, RPD=1.     Resting HR 57 bpm     Resting BP 138/88     Resting Oxygen Saturation  97 %     Exercise Oxygen Saturation  during 6 min walk 95 %     Max Ex. HR 88 bpm     Max Ex. BP 168/92     2 Minute Post BP 142/88  Oxygen Initial Assessment:   Oxygen  Re-Evaluation:   Oxygen Discharge (Final Oxygen Re-Evaluation):   Initial Exercise Prescription:  Initial Exercise Prescription - 06/09/22 1100       Date of Initial Exercise RX and Referring Provider   Date 06/09/22    Referring Provider Dolores Patty, MD    Expected Discharge Date 08/21/22      NuStep   Level 2    SPM 85    Minutes 15    METs 2.5      Recumbant Elliptical   Level 1    Minutes 15    METs 2.8      Prescription Details   Frequency (times per week) 3    Duration Progress to 30 minutes of continuous aerobic without signs/symptoms of physical distress      Intensity   THRR 40-80% of Max Heartrate 56-111    Ratings of Perceived Exertion 11-13    Perceived Dyspnea 0-4      Progression   Progression Continue to progress workloads to maintain intensity without signs/symptoms of physical distress.      Resistance Training   Training Prescription Yes    Weight 2 lbs    Reps 10-15             Perform Capillary Blood Glucose checks as needed.  Exercise Prescription Changes:   Exercise Prescription Changes     Row Name 06/15/22 [phone] 06/26/22 0832 07/10/22 0851 08/05/22 0800 08/19/22 0830     Response to Exercise   Blood Pressure (Admit) 122/68 124/74 136/70 118/66 120/68   Blood Pressure (Exercise) 152/82 146/88 150/70 126/60 120/66   Blood Pressure (Exit) 114/64 104/58 124/66 112/60 102/66   Heart Rate (Admit) 66 bpm 69 bpm 71 bpm 64 bpm 67 bpm   Heart Rate (Exercise) 90 bpm 106 bpm 91 bpm 94 bpm 106 bpm   Heart Rate (Exit) 73 bpm 73 bpm 69 bpm 73 bpm 66 bpm   Rating of Perceived Exertion (Exercise) 12.5 12.5 11.5 13 13    Perceived Dyspnea (Exercise) 0 0 0 0 0   Symptoms 0 0 0 0 0   Comments Pt first day in the [facility] program Reviewed MET's and goals Reviewed MET's, goals and Home ExRx Reviewed METs and goals REVD MET's   Duration Progress to 30 minutes of  aerobic without signs/symptoms of physical distress Progress to 30 minutes  of  aerobic without signs/symptoms of physical distress Progress to 30 minutes of  aerobic without signs/symptoms of physical distress Continue with 30 min of aerobic exercise without signs/symptoms of physical distress. Continue with 30 min of aerobic exercise without signs/symptoms of physical distress.   Intensity THRR unchanged THRR unchanged THRR unchanged THRR unchanged THRR unchanged     Progression   Progression Continue to progress workloads to maintain intensity without signs/symptoms of physical distress. Continue to progress workloads to maintain intensity without signs/symptoms of physical distress. Continue to progress workloads to maintain intensity without signs/symptoms of physical distress. Continue to progress workloads to maintain intensity without signs/symptoms of physical distress. Continue to progress workloads to maintain intensity without signs/symptoms of physical distress.   Average METs 2.7 3.9 3.8 4.2 4.1     Resistance Training   Training Prescription Yes Yes Yes Yes No   Weight 2 lbs 2 lbs 2 lbs 2 --   Reps 10-15 10-15 10-15 10-15 --   Time 10 Minutes 10 Minutes 10 Minutes 10 Minutes --     NuStep   Level  2 2 2 2 2    SPM 77 66 101 105 1.8   Minutes 15 15 15 15 15    METs 2.3 2.8 2.8 3.3 3.2     Recumbant Elliptical   Level 1 1 1 1 1    RPM -- 66 64 -- 70   Watts -- 96 97 -- 95   Minutes 15 15 15 15 15    METs 3.1 5 4.8 5.1 5.1     Home Exercise Plan   Plans to continue exercise at -- -- Home (comment) -- Home (comment)   Frequency -- -- Add 3 additional days to program exercise sessions. -- Add 3 additional days to program exercise sessions.   Initial Home Exercises Provided -- -- 07/10/22 -- 07/10/22            Exercise Comments:   Exercise Comments     Row Name 06/15/22 [phone] 06/26/22 0839 07/10/22 0827 08/05/22 0842 08/19/22 0830   Exercise Comments Pt first day in the [facility] program. Pt tolerated exercise well with an average MET level  of 2.7. Pt is learning her THRR, RPE and ExRx. Off to a great start. Reviewed MET's and goals with pt today. Pt tolerated exercise well with an average MET level of 3.9. Pt is having continued concerns with increased fatigue, evening GERD symptoms and SOB with exercise, referred to nurse. Did not review home exercise due to symptoms and fatigue with current exercise plan. Reviewed MET's, goals and home ExRx. Pt tolerated exercise well with an average MET level of 3.8. Pt is feeling stonger and states she has more energy on the days when she exercises. Still working on eating better and her wt loss, but she feels she has the resources she needs and is Administrator, arts better choices. Pt is seeing a personal trainer 1 day a week and walking her dog for long walks 1 day a week. encouraged one extra day of a long walk with her dog so that she gets 3 days on her own of 30-45 mins. Reviewed METs and goals with pt today. Pt is tolerating execise with an avg MET level of 4.2. She continues to show good progression within the program. She feels good about her goals of understanding exercise parameters and how to make an exercise plan. She has been walking at home and feels she is able to walk faster and longer. She still feels SOB at times throughout the day and said she will be doing a pulmonary function test soon with her Dr. and is looking forward to the results. Will continue to monitor and progress workloads as tolerated without s/sx. Reviewed METs and goals with pt today. Pt is tolerating execise with an avg MET level of 4.1. Pt feels good about her progress so far and is doing well with exercise            Exercise Goals and Review:   Exercise Goals     Row Name 06/09/22 1027             Exercise Goals   Increase Physical Activity Yes       Intervention Provide advice, education, support and counseling about physical activity/exercise needs.;Develop an individualized exercise prescription for aerobic and  resistive training based on initial evaluation findings, risk stratification, comorbidities and participant's personal goals.       Expected Outcomes Short Term: Attend rehab on a regular basis to increase amount of physical activity.;Long Term: Add in home exercise to make exercise part of  routine and to increase amount of physical activity.;Long Term: Exercising regularly at least 3-5 days a week.       Increase Strength and Stamina Yes       Intervention Provide advice, education, support and counseling about physical activity/exercise needs.;Develop an individualized exercise prescription for aerobic and resistive training based on initial evaluation findings, risk stratification, comorbidities and participant's personal goals.       Expected Outcomes Short Term: Increase workloads from initial exercise prescription for resistance, speed, and METs.;Short Term: Perform resistance training exercises routinely during rehab and add in resistance training at home;Long Term: Improve cardiorespiratory fitness, muscular endurance and strength as measured by increased METs and functional capacity ( )       Able to understand and use rate of perceived exertion (RPE) scale Yes       Intervention Provide education and explanation on how to use RPE scale       Expected Outcomes Short Term: Able to use RPE daily in rehab to express subjective intensity level;Long Term:  Able to use RPE to guide intensity level when exercising independently       Knowledge and understanding of Target Heart Rate Range (THRR) Yes       Intervention Provide education and explanation of THRR including how the numbers were predicted and where they are located for reference       Expected Outcomes Short Term: Able to state/look up THRR;Long Term: Able to use THRR to govern intensity when exercising independently;Short Term: Able to use daily as guideline for intensity in rehab       Able to check pulse independently Yes        Intervention Provide education and demonstration on how to check pulse in carotid and radial arteries.;Review the importance of being able to check your own pulse for safety during independent exercise       Expected Outcomes Long Term: Able to check pulse independently and accurately;Short Term: Able to explain why pulse checking is important during independent exercise       Understanding of Exercise Prescription Yes       Intervention Provide education, explanation, and written materials on patient's individual exercise prescription       Expected Outcomes Short Term: Able to explain program exercise prescription;Long Term: Able to explain home exercise prescription to exercise independently                Exercise Goals Re-Evaluation :  Exercise Goals Re-Evaluation     Row Name 06/15/22 [phone] 06/26/22 0834 07/10/22 0821 08/05/22 0839       Exercise Goal Re-Evaluation   Exercise Goals Review Increase Physical Activity;Understanding of Exercise Prescription;Increase Strength and Stamina;Knowledge and understanding of Target Heart Rate Range (THRR);Able to understand and use rate of perceived exertion (RPE) scale Increase Physical Activity;Understanding of Exercise Prescription;Increase Strength and Stamina;Knowledge and understanding of Target Heart Rate Range (THRR);Able to understand and use rate of perceived exertion (RPE) scale Increase Physical Activity;Understanding of Exercise Prescription;Increase Strength and Stamina;Knowledge and understanding of Target Heart Rate Range (THRR);Able to understand and use rate of perceived exertion (RPE) scale Increase Physical Activity;Able to understand and use Dyspnea scale;Understanding of Exercise Prescription;Increase Strength and Stamina;Knowledge and understanding of Target Heart Rate Range (THRR);Able to understand and use rate of perceived exertion (RPE) scale    Comments Pt first day in the [facility] program. Pt tolerated exercise well with  an average MET level of 2.7. Pt is learning her THRR, RPE and ExRx Reviewed MET's  and goals with pt today. Pt tolerated exercise well with an average MET level of 3.9. Pt is having continued concerns with increased fatigue, evening GERD symptoms and SOB with exercise, referred to nurse. Did not review home exercise due to symptoms and fatigue with current exercise plan. Reviewed MET's, goals and home ExRx. Pt tolerated exercise well with an average MET level of 3.8. Pt is feeling stonger and states she has more energy on the days when she exercises. Still working on eating better and her wt loss, but she feels she has the resources she needs and is Administrator, arts better choices. Pt is seeing a personal trainer 1 day a week and walking her dog for long walks 1 day a week. encouraged one extra day of a long walk with her dog so that she gets 3 days on her own of 30-45 mins. Reviewed METs and goals with pt today. Pt is tolerating execise with an avg MET level of 4.2. She continues to show good progression within the program. She feels good about her goals of understanding exercise parameters and how to make an exercise plan. She has been walking at home and feels she is able to walk faster and longer. She still feels SOB at times throughout the day and said she will be doing a pulmonary function test soon with her Dr. and is looking forward to the results.    Expected Outcomes Will continue to monitor pt and progress workloads as tolerated without sign or symptom Will continue to monitor pt and progress workloads as tolerated without sign or symptom Will continue to monitor pt and progress workloads as tolerated without sign or symptom Will continue to monitor and progress workloads as tolerated without s/sx.             Discharge Exercise Prescription (Final Exercise Prescription Changes):  Exercise Prescription Changes - 08/19/22 0830       Response to Exercise   Blood Pressure (Admit) 120/68    Blood  Pressure (Exercise) 120/66    Blood Pressure (Exit) 102/66    Heart Rate (Admit) 67 bpm    Heart Rate (Exercise) 106 bpm    Heart Rate (Exit) 66 bpm    Rating of Perceived Exertion (Exercise) 13    Perceived Dyspnea (Exercise) 0    Symptoms 0    Comments REVD MET's    Duration Continue with 30 min of aerobic exercise without signs/symptoms of physical distress.    Intensity THRR unchanged      Progression   Progression Continue to progress workloads to maintain intensity without signs/symptoms of physical distress.    Average METs 4.1      Resistance Training   Training Prescription No      NuStep   Level 2    SPM 1.8    Minutes 15    METs 3.2      Recumbant Elliptical   Level 1    RPM 70    Watts 95    Minutes 15    METs 5.1      Home Exercise Plan   Plans to continue exercise at Home (comment)    Frequency Add 3 additional days to program exercise sessions.    Initial Home Exercises Provided 07/10/22             Nutrition:  Target Goals: Understanding of nutrition guidelines, daily intake of sodium 1500mg , cholesterol 200mg , calories 30% from fat and 7% or less from saturated fats, daily to have 5 or  more servings of fruits and vegetables.  Biometrics:  Pre Biometrics - 06/09/22 0906       Pre Biometrics   Waist Circumference 38 inches    Hip Circumference 41.75 inches    Waist to Hip Ratio 0.91 %    Triceps Skinfold 25 mm    % Body Fat 39.9 %    Grip Strength 23 kg    Flexibility 0 in    Single Leg Stand 30 seconds              Nutrition Therapy Plan and Nutrition Goals:  Nutrition Therapy & Goals - 08/14/22 1033       Nutrition Therapy   Diet Heart Healthy Diet      Personal Nutrition Goals   Nutrition Goal Patient to identify strategies for reducing cardiovascular risk by attending the weekly [facility] education and nutrition series    Personal Goal #2 Patient to improve diet quality by using the plate method as a daily guide for meal  planning to include lean protein/plant protein, fruits, vegetables, whole grains, nonfat dairy as part of well balanced diet    Personal Goal #3 Patient to reduce sodium intake to 1500mg  per day    Personal Goal #4 Patient to identify strategies for weight loss of 0.5-2.0# per week.    Comments Goals in action. She continues to attend the [facility] education and nutrition series regularly. Jazzleen lives alone and does infrequent cooking. She continues Repatha to aid with LDL goal <70. Per documenation from Heart failure clinic on 05/06/2022, recommend weight loss of 10-15# to aid with DOE. She is down 7.5# since starting with our program. She reports increased awareness/ improved knowledge of appropriate dietary changes; however, she continues to admit that there is additional room for improvement regarding the consistency of these changes. Jorian will continue to benefit from participation in intensive cardiac rehab for nutrition, exercise, and lifestyle modification.      Intervention Plan   Intervention Prescribe, educate and counsel regarding individualized specific dietary modifications aiming towards targeted core components such as weight, hypertension, lipid management, diabetes, heart failure and other comorbidities.;Nutrition handout(s) given to patient.    Expected Outcomes Long Term Goal: Adherence to prescribed nutrition plan.;Short Term Goal: A plan has been developed with personal nutrition goals set during dietitian appointment.;Short Term Goal: Understand basic principles of dietary content, such as calories, fat, sodium, cholesterol and nutrients.             Nutrition Assessments:  Nutrition Assessments - 06/26/22 0927       Rate Your Plate Scores   Pre Score 63            MEDIFICTS Score Key: ?70 Need to make dietary changes  40-70 Heart Healthy Diet ? 40 Therapeutic Level Cholesterol Diet   Flowsheet Row INTENSIVE CARDIAC REHAB from 06/26/2022 in [facility] for Heart, Vascular, & Lung Health  Picture Your Plate Total Score on Admission 63      Picture Your Plate Scores: <16 Unhealthy dietary pattern with much room for improvement. 41-50 Dietary pattern unlikely to meet recommendations for good health and room for improvement. 51-60 More healthful dietary pattern, with some room for improvement.  >60 Healthy dietary pattern, although there may be some specific behaviors that could be improved.    Nutrition Goals Re-Evaluation:  Nutrition Goals Re-Evaluation     Row Name 06/15/22 [phone] 07/15/22 0933 08/14/22 1033         Goals  Current Weight 167 lb 5.3 oz (75.9 kg) 163 lb 2.3 oz (74 kg) 160 lb 15 oz (73 kg)     Comment A1c WNL, LDL 90 No new labs at this time; most recent labs A1c WNL, LDL 90 No new labs at this time; most recent labs A1c WNL, LDL 90     Expected Outcome Patient's most recent lipid panel (from 02/10/2022) show LDL not at goal of <70 (90); she is taking Repatha. Per documenation from Heart failure clinic on 05/06/2022, recommend weight loss of 10-15# to aid with DOE. Ayriana will continue to benefit from participation in intensive cardiac rehab for nutrition, exercise, and lifestyle modification. Goals in action. She continues to attend the [facility] education and nutrition series regularly. Aleesha lives alone and does infrequent cooking. Discussed and gave resources for healthy convenience foods and simple recipes. Patient's most recent lipid panel (from 02/10/2022) show LDL not at goal of <70 (90); she is taking Repatha. Per documenation from Heart failure clinic on 05/06/2022, recommend weight loss of 10-15# to aid with DOE. She is down 5# since starting with our program. Santoya will continue to benefit from participation in intensive cardiac rehab for nutrition, exercise, and lifestyle modification. Goals in action. She continues to attend the [facility] education and nutrition series regularly. Cassadee  lives alone and does infrequent cooking. She continues Repatha to aid with LDL goal <70. Per documenation from Heart failure clinic on 05/06/2022, recommend weight loss of 10-15# to aid with DOE. She is down 7.5# since starting with our program. She reports increased awareness/ improved knowledge of appropriate dietary changes; however, she continues to admit that there is additional room for improvement regarding the consistency of these changes. Charsie will continue to benefit from participation in intensive cardiac rehab for nutrition, exercise, and lifestyle modification.              Nutrition Goals Re-Evaluation:  Nutrition Goals Re-Evaluation     Row Name 06/15/22 0954 07/15/22 0933 08/14/22 1033         Goals   Current Weight 167 lb 5.3 oz (75.9 kg) 163 lb 2.3 oz (74 kg) 160 lb 15 oz (73 kg)     Comment A1c WNL, LDL 90 No new labs at this time; most recent labs A1c WNL, LDL 90 No new labs at this time; most recent labs A1c WNL, LDL 90     Expected Outcome Patient's most recent lipid panel (from 02/10/2022) show LDL not at goal of <70 (90); she is taking Repatha. Per documenation from Heart failure clinic on 05/06/2022, recommend weight loss of 10-15# to aid with DOE. Aquira will continue to benefit from participation in intensive cardiac rehab for nutrition, exercise, and lifestyle modification. Goals in action. She continues to attend the Pritikin education and nutrition series regularly. Lillieanna lives alone and does infrequent cooking. Discussed and gave resources for healthy convenience foods and simple recipes. Patient's most recent lipid panel (from 02/10/2022) show LDL not at goal of <70 (90); she is taking Repatha. Per documenation from Heart failure clinic on 05/06/2022, recommend weight loss of 10-15# to aid with DOE. She is down 5# since starting with our program. Dajah will continue to benefit from participation in intensive cardiac rehab for nutrition, exercise, and lifestyle  modification. Goals in action. She continues to attend the Pritikin education and nutrition series regularly. Marnette lives alone and does infrequent cooking. She continues Repatha to aid with LDL goal <70. Per documenation from Heart failure clinic on 05/06/2022,  recommend weight loss of 10-15# to aid with DOE. She is down 7.5# since starting with our program. She reports increased awareness/ improved knowledge of appropriate dietary changes; however, she continues to admit that there is additional room for improvement regarding the consistency of these changes. Gracin will continue to benefit from participation in intensive cardiac rehab for nutrition, exercise, and lifestyle modification.              Nutrition Goals Discharge (Final Nutrition Goals Re-Evaluation):  Nutrition Goals Re-Evaluation - 08/14/22 1033       Goals   Current Weight 160 lb 15 oz (73 kg)    Comment No new labs at this time; most recent labs A1c WNL, LDL 90    Expected Outcome Goals in action. She continues to attend the Pritikin education and nutrition series regularly. Jennah lives alone and does infrequent cooking. She continues Repatha to aid with LDL goal <70. Per documenation from Heart failure clinic on 05/06/2022, recommend weight loss of 10-15# to aid with DOE. She is down 7.5# since starting with our program. She reports increased awareness/ improved knowledge of appropriate dietary changes; however, she continues to admit that there is additional room for improvement regarding the consistency of these changes. Robynne will continue to benefit from participation in intensive cardiac rehab for nutrition, exercise, and lifestyle modification.             Psychosocial: Target Goals: Acknowledge presence or absence of significant depression and/or stress, maximize coping skills, provide positive support system. Participant is able to verbalize types and ability to use techniques and skills needed for reducing  stress and depression.  Initial Review & Psychosocial Screening:  Initial Psych Review & Screening - 06/09/22 1141       Initial Review   Current issues with None Identified      Family Dynamics   Good Support System? Yes    Comments Patient travels and does activities with friends.      Barriers   Psychosocial barriers to participate in program There are no identifiable barriers or psychosocial needs.             Quality of Life Scores:  Quality of Life - 08/17/22 0938       Quality of Life   Select Quality of Life      Quality of Life Scores   Health/Function Post 22.15 %    Socioeconomic Post 30 %    Psych/Spiritual Post 28.29 %    Family Post 28.5 %    GLOBAL Pre 26 %            Scores of 19 and below usually indicate a poorer quality of life in these areas.  A difference of  2-3 points is a clinically meaningful difference.  A difference of 2-3 points in the total score of the Quality of Life Index has been associated with significant improvement in overall quality of life, self-image, physical symptoms, and general health in studies assessing change in quality of life.  PHQ-9: Review Flowsheet       08/19/2022 07/30/2022 06/09/2022 07/17/2021  Depression screen PHQ 2/9  Decreased Interest 0 0 0 0  Down, Depressed, Hopeless 0 0 0 1  PHQ - 2 Score 0 0 0 1  Altered sleeping 0 - 0 -  Tired, decreased energy 1 - 1 -  Change in appetite 0 - 0 -  Feeling bad or failure about yourself  0 - 0 -  Trouble concentrating 0 -  0 -  Moving slowly or fidgety/restless 0 - 0 -  Suicidal thoughts 0 - 0 -  PHQ-9 Score 1 - 1 -  Difficult doing work/chores Not difficult at all - Not difficult at all -   Interpretation of Total Score  Total Score Depression Severity:  1-4 = Minimal depression, 5-9 = Mild depression, 10-14 = Moderate depression, 15-19 = Moderately severe depression, 20-27 = Severe depression   Psychosocial Evaluation and Intervention:   Psychosocial  Re-Evaluation:  Psychosocial Re-Evaluation     Row Name 06/15/22 1646 07/27/22 0825 08/19/22 1506         Psychosocial Re-Evaluation   Current issues with None Identified None Identified None Identified     Interventions Encouraged to attend Cardiac Rehabilitation for the exercise Encouraged to attend Cardiac Rehabilitation for the exercise Encouraged to attend Cardiac Rehabilitation for the exercise     Continue Psychosocial Services  No Follow up required No Follow up required No Follow up required              Psychosocial Discharge (Final Psychosocial Re-Evaluation):  Psychosocial Re-Evaluation - 08/19/22 1506       Psychosocial Re-Evaluation   Current issues with None Identified    Interventions Encouraged to attend Cardiac Rehabilitation for the exercise    Continue Psychosocial Services  No Follow up required             Vocational Rehabilitation: Provide vocational rehab assistance to qualifying candidates.   Vocational Rehab Evaluation & Intervention:  Vocational Rehab - 06/09/22 1143       Initial Vocational Rehab Evaluation & Intervention   Assessment shows need for Vocational Rehabilitation No      Vocational Rehab Re-Evaulation   Comments Patient is retired.             Education: Education Goals: Education classes will be provided on a weekly basis, covering required topics. Participant will state understanding/return demonstration of topics presented.    Education     Row Name 06/15/22 0900     Education   Cardiac Education Topics Pritikin   Select Workshops     Workshops   Educator Exercise Physiologist   Select Psychosocial   Psychosocial Workshop Other  From [employer] to [employer] of a Healthy Outlook   Instruction Review Code 1- Verbalizes Understanding   Class Start Time [phone]   Class Stop Time 0856   Class Time Calculation (min) 44 min    Row Name 06/17/22 1000     Education   Cardiac Education Topics Pritikin   Building surveyor School   Educator Dietitian   Weekly Topic Adding Flavor - Sodium-Free   Instruction Review Code 1- Verbalizes Understanding   Class Start Time 0815   Class Stop Time 0858   Class Time Calculation (min) 43 min    Row Name 06/19/22 1400     Education   Cardiac Education Topics Pritikin   Psychologist, forensic General Education   General Education Heart Disease Risk Reduction   Instruction Review Code 1- Verbalizes Understanding   Class Start Time 0815   Class Stop Time 0852   Class Time Calculation (min) 37 min    Row Name 06/22/22 1200     Education   Cardiac Education Topics Pritikin   Geographical information systems officer Exercise  Exercise Workshop Location manager and Fall Prevention   Instruction Review Code 1- Verbalizes Understanding   Class Start Time 0815   Class Stop Time 0855   Class Time Calculation (min) 40 min    Row Name 06/24/22 1000     Education   Cardiac Education Topics Pritikin   Secondary school teacher School   Educator Dietitian   Weekly Topic Fast and Healthy Breakfasts   Instruction Review Code 1- Verbalizes Understanding   Class Start Time 0813   Class Stop Time 0856   Class Time Calculation (min) 43 min    Row Name 06/26/22 0900     Education   Cardiac Education Topics Pritikin   Select Core Videos     Core Videos   Educator Dietitian   Select Nutrition   Nutrition Overview of the Pritikin Eating Plan   Instruction Review Code 1- Verbalizes Understanding   Class Start Time 0815   Class Stop Time 0905   Class Time Calculation (min) 50 min    Row Name 06/29/22 0900     Education   Cardiac Education Topics Pritikin   Select Workshops     Workshops   Educator Exercise Physiologist   Select Psychosocial   Psychosocial Workshop Recognizing and Reducing Stress   Instruction Review Code 1-  Verbalizes Understanding   Class Start Time [phone]   Class Stop Time 0853   Class Time Calculation (min) 41 min    Row Name 07/01/22 0900     Education   Cardiac Education Topics Pritikin   Secondary school teacher School   Educator Dietitian   Weekly Topic Personalizing Your Pritikin Plate   Instruction Review Code 1- Verbalizes Understanding   Class Start Time [phone]   Class Stop Time 0848   Class Time Calculation (min) 38 min    Row Name 07/03/22 1100     Education   Cardiac Education Topics Pritikin   Nurse, children's   Educator Exercise Physiologist   Select Psychosocial   Psychosocial Healthy Minds, Bodies, Hearts   Instruction Review Code 1- Verbalizes Understanding   Class Start Time [phone]   Class Stop Time 0853   Class Time Calculation (min) 36 min    Row Name 07/06/22 0900     Education   Cardiac Education Topics Pritikin   Glass blower/designer Nutrition   Nutrition Workshop Label Reading   Instruction Review Code 1- Verbalizes Understanding   Class Start Time 0815   Class Stop Time 0905   Class Time Calculation (min) 50 min    Row Name 07/08/22 1100     Education   Cardiac Education Topics Pritikin   Secondary school teacher School   Educator Dietitian   Weekly Topic [employer] Desserts   Instruction Review Code 1- Verbalizes Understanding   Class Start Time 0815   Class Stop Time 0856   Class Time Calculation (min) 41 min    Row Name 07/10/22 0900     Education   Cardiac Education Topics Pritikin   Select Core Videos     Core Videos   Educator Dietitian   Nutrition Other   Instruction Review Code 1- Verbalizes Understanding   Class Start Time 0815   Class Stop Time 0900   Class Time Calculation (min) 45 min    Row Name 07/13/22  0900     Education   Cardiac Education Topics Pritikin   Consulting civil engineer Exercise   Exercise Workshop Exercise Basics: Building Your Action Plan   Instruction Review Code 1- Verbalizes Understanding   Class Start Time 0815   Class Stop Time 0903   Class Time Calculation (min) 48 min    Row Name 07/15/22 0900     Education   Cardiac Education Topics Pritikin   Secondary school teacher School   Educator Dietitian   Weekly Topic Tasty Appetizers and Snacks   Instruction Review Code 1- Verbalizes Understanding   Class Start Time 0813   Class Stop Time 0845   Class Time Calculation (min) 32 min    Row Name 07/17/22 0900     Education   Cardiac Education Topics Pritikin   Select Core Videos     Core Videos   Educator Dietitian   Select Nutrition   Nutrition Calorie Density   Instruction Review Code 1- Verbalizes Understanding   Class Start Time 0815   Class Stop Time 0856   Class Time Calculation (min) 41 min    Row Name 07/20/22 0900     Education   Cardiac Education Topics Pritikin   Select Core Videos     Core Videos   Educator Dietitian   Select Nutrition   Nutrition Nutrition Action Plan   Instruction Review Code 1- Verbalizes Understanding   Class Start Time 0815   Class Stop Time 0854   Class Time Calculation (min) 39 min    Row Name 07/22/22 0900     Education   Cardiac Education Topics Pritikin   Secondary school teacher School   Educator Dietitian   Weekly Topic Efficiency Cooking - Meals in a Snap   Instruction Review Code 1- Verbalizes Understanding   Class Start Time 0815   Class Stop Time 0849   Class Time Calculation (min) 34 min    Row Name 07/24/22 0900     Education   Cardiac Education Topics Pritikin   Select Core Videos     Core Videos   Educator Exercise Physiologist   Select Exercise Education   Exercise Education Move It!   Instruction Review Code 1- Verbalizes Understanding   Class Start Time [phone]   Class Stop Time 0858   Class Time Calculation (min) 42 min    Row  Name 07/27/22 1600     Education   Cardiac Education Topics Pritikin   Select Workshops     Workshops   Educator Exercise Physiologist   Select Psychosocial   Psychosocial Workshop Focused Goals, Sustainable Changes   Instruction Review Code 1- Verbalizes Understanding   Class Start Time 0810   Class Stop Time 0855   Class Time Calculation (min) 45 min    Row Name 07/29/22 0900     Education   Cardiac Education Topics Pritikin   Secondary school teacher School   Educator Dietitian   Weekly Topic One-Pot Wonders   Instruction Review Code 1- Verbalizes Understanding   Class Start Time 0815   Class Stop Time 0849   Class Time Calculation (min) 34 min    Row Name 07/31/22 0900     Education   Cardiac Education Topics Pritikin   Nurse, children's Exercise Physiologist  Select General Education   General Education Hypertension and Heart Disease   Instruction Review Code 1- Verbalizes Understanding   Class Start Time [phone]   Class Stop Time 0852   Class Time Calculation (min) 40 min    Row Name 08/03/22 0800     Education   Cardiac Education Topics Pritikin   Select Core Videos     Core Videos   Educator Exercise Physiologist   Select Exercise Education   Exercise Education Biomechanial Limitations   Instruction Review Code 1- Verbalizes Understanding   Class Start Time [phone]   Class Stop Time 0855   Class Time Calculation (min) 41 min    Row Name 08/05/22 0900     Education   Cardiac Education Topics Pritikin   Orthoptist   Educator Dietitian   Weekly Topic Comforting Weekend Breakfasts   Instruction Review Code 1- Verbalizes Understanding   Class Start Time 0815   Class Stop Time 0846   Class Time Calculation (min) 31 min    Row Name 08/07/22 0900     Education   Cardiac Education Topics Pritikin   Select Core Videos     Core Videos   Educator Dietitian   Select Nutrition    Nutrition Dining Out - Part 1   Instruction Review Code 1- Verbalizes Understanding   Class Start Time 0815   Class Stop Time 0849   Class Time Calculation (min) 34 min    Row Name 08/10/22 0900     Education   Cardiac Education Topics Pritikin   Select Core Videos     Core Videos   Educator Dietitian   Select Nutrition   Nutrition Facts on Fat   Instruction Review Code 1- Verbalizes Understanding   Class Start Time 0815   Class Stop Time 0852   Class Time Calculation (min) 37 min    Row Name 08/12/22 1000     Education   Cardiac Education Topics Pritikin   Orthoptist   Educator Dietitian   Weekly Topic Fast Evening Meals   Instruction Review Code 1- Verbalizes Understanding   Class Start Time 0815   Class Stop Time 5409   Class Time Calculation (min) 37 min    Row Name 08/17/22 1100     Education   Cardiac Education Topics Pritikin   Select Workshops     Workshops   Educator Exercise Physiologist   Select Psychosocial   Psychosocial Workshop Healthy Sleep for a Healthy Heart   Instruction Review Code 1- Verbalizes Understanding   Class Start Time 0815   Class Stop Time 0856   Class Time Calculation (min) 41 min    Row Name 08/19/22 1000     Education   Cardiac Education Topics Pritikin   Customer service manager   Weekly Topic [employer]- Spotlight on the [facility] Zones   Instruction Review Code 1- Verbalizes Understanding   Class Start Time [phone]   Class Stop Time [phone]   Class Time Calculation (min) 32 min            Core Videos: Exercise    Move It!  Clinical staff conducted group or individual video education with verbal and written material and guidebook.  Patient learns the recommended Pritikin exercise program. Exercise with the goal of living a long, healthy life. Some of the health benefits of exercise include  controlled diabetes, healthier blood pressure levels,  improved cholesterol levels, improved heart and lung capacity, improved sleep, and better body composition. Everyone should speak with their doctor before starting or changing an exercise routine.  Biomechanical Limitations Clinical staff conducted group or individual video education with verbal and written material and guidebook.  Patient learns how biomechanical limitations can impact exercise and how we can mitigate and possibly overcome limitations to have an impactful and balanced exercise routine.  Body Composition Clinical staff conducted group or individual video education with verbal and written material and guidebook.  Patient learns that body composition (ratio of muscle mass to fat mass) is a key component to assessing overall fitness, rather than body weight alone. Increased fat mass, especially visceral belly fat, can put [place] at increased risk for metabolic syndrome, type 2 diabetes, heart disease, and even death. It is recommended to combine diet and exercise (cardiovascular and resistance training) to improve your body composition. Seek guidance from your physician and exercise physiologist before implementing an exercise routine.  Exercise Action Plan Clinical staff conducted group or individual video education with verbal and written material and guidebook.  Patient learns the recommended strategies to achieve and enjoy long-term exercise adherence, including variety, self-motivation, self-efficacy, and positive decision making. Benefits of exercise include fitness, good health, weight management, more energy, better sleep, less stress, and overall well-being.  Medical   Heart Disease Risk Reduction Clinical staff conducted group or individual video education with verbal and written material and guidebook.  Patient learns our heart is our most vital organ as it circulates oxygen, nutrients, white blood cells, and hormones throughout the entire body, and carries waste away. Data  supports a plant-based eating plan like the Pritikin Program for its effectiveness in slowing progression of and reversing heart disease. The video provides a number of recommendations to address heart disease.   Metabolic Syndrome and Belly Fat  Clinical staff conducted group or individual video education with verbal and written material and guidebook.  Patient learns what metabolic syndrome is, how it leads to heart disease, and how one can reverse it and keep it from coming back. You have metabolic syndrome if you have 3 of the following 5 criteria: abdominal obesity, high blood pressure, high triglycerides, low HDL cholesterol, and high blood sugar.  Hypertension and Heart Disease Clinical staff conducted group or individual video education with verbal and written material and guidebook.  Patient learns that high blood pressure, or hypertension, is very common in the [place]. Hypertension is largely due to excessive salt intake, but other important risk factors include being overweight, physical inactivity, drinking too much alcohol, smoking, and not eating enough potassium from fruits and vegetables. High blood pressure is a leading risk factor for heart attack, stroke, congestive heart failure, dementia, kidney failure, and premature death. Long-term effects of excessive salt intake include stiffening of the arteries and thickening of heart muscle and organ damage. Recommendations include ways to reduce hypertension and the risk of heart disease.  Diseases of Our Time - Focusing on Diabetes Clinical staff conducted group or individual video education with verbal and written material and guidebook.  Patient learns why the best way to stop diseases of our time is prevention, through food and other lifestyle changes. Medicine (such as prescription pills and surgeries) is often only a Band-Aid on the problem, not a long-term solution. Most common diseases of our time include obesity, type 2  diabetes, hypertension, heart disease, and cancer. The Pritikin Program is recommended and  has been proven to help reduce, reverse, and/or prevent the damaging effects of metabolic syndrome.  Nutrition   Overview of the Pritikin Eating Plan  Clinical staff conducted group or individual video education with verbal and written material and guidebook.  Patient learns about the Pritikin Eating Plan for disease risk reduction. The Pritikin Eating Plan emphasizes a wide variety of unrefined, minimally-processed carbohydrates, like fruits, vegetables, whole grains, and legumes. Go, Caution, and Stop food choices are explained. Plant-based and lean animal proteins are emphasized. Rationale provided for low sodium intake for blood pressure control, low added sugars for blood sugar stabilization, and low added fats and oils for coronary artery disease risk reduction and weight management.  Calorie Density  Clinical staff conducted group or individual video education with verbal and written material and guidebook.  Patient learns about calorie density and how it impacts the Pritikin Eating Plan. Knowing the characteristics of the food you choose will help you decide whether those foods will lead to weight gain or weight loss, and whether you want to consume more or less of them. Weight loss is usually a side effect of the Pritikin Eating Plan because of its focus on low calorie-dense foods.  Label Reading  Clinical staff conducted group or individual video education with verbal and written material and guidebook.  Patient learns about the Pritikin recommended label reading guidelines and corresponding recommendations regarding calorie density, added sugars, sodium content, and whole grains.  Dining Out - Part 1  Clinical staff conducted group or individual video education with verbal and written material and guidebook.  Patient learns that restaurant meals can be sabotaging because they can be so high in  calories, fat, sodium, and/or sugar. Patient learns recommended strategies on how to positively address this and avoid unhealthy pitfalls.  Facts on Fats  Clinical staff conducted group or individual video education with verbal and written material and guidebook.  Patient learns that lifestyle modifications can be just as effective, if not more so, as many medications for lowering your risk of heart disease. A Pritikin lifestyle can help to reduce your risk of inflammation and atherosclerosis (cholesterol build-up, or plaque, in the artery walls). Lifestyle interventions such as dietary choices and physical activity address the cause of atherosclerosis. A review of the types of fats and their impact on blood cholesterol levels, along with dietary recommendations to reduce fat intake is also included.  Nutrition Action Plan  Clinical staff conducted group or individual video education with verbal and written material and guidebook.  Patient learns how to incorporate Pritikin recommendations into their lifestyle. Recommendations include planning and keeping personal health goals in mind as an important part of their success.  Healthy Mind-Set    Healthy Minds, Bodies, Hearts  Clinical staff conducted group or individual video education with verbal and written material and guidebook.  Patient learns how to identify when they are stressed. Video will discuss the impact of that stress, as well as the many benefits of stress management. Patient will also be introduced to stress management techniques. The way we think, act, and feel has an impact on our hearts.  How Our Thoughts Can Heal Our Hearts  Clinical staff conducted group or individual video education with verbal and written material and guidebook.  Patient learns that negative thoughts can cause depression and anxiety. This can result in negative lifestyle behavior and serious health problems. Cognitive behavioral therapy is an effective method to  help control our thoughts in order to change and improve our  emotional outlook.  Additional Videos:  Exercise    Improving Performance  Clinical staff conducted group or individual video education with verbal and written material and guidebook.  Patient learns to use a non-linear approach by alternating intensity levels and lengths of time spent exercising to help burn more calories and lose more body fat. Cardiovascular exercise helps improve heart health, metabolism, hormonal balance, blood sugar control, and recovery from fatigue. Resistance training improves strength, endurance, balance, coordination, reaction time, metabolism, and muscle mass. Flexibility exercise improves circulation, posture, and balance. Seek guidance from your physician and exercise physiologist before implementing an exercise routine and learn your capabilities and proper form for all exercise.  Introduction to Yoga  Clinical staff conducted group or individual video education with verbal and written material and guidebook.  Patient learns about yoga, a discipline of the coming together of mind, breath, and body. The benefits of yoga include improved flexibility, improved range of motion, better posture and core strength, increased lung function, weight loss, and positive self-image. Yoga's heart health benefits include lowered blood pressure, healthier heart rate, decreased cholesterol and triglyceride levels, improved immune function, and reduced stress. Seek guidance from your physician and exercise physiologist before implementing an exercise routine and learn your capabilities and proper form for all exercise.  Medical   Aging: Enhancing Your Quality of Life  Clinical staff conducted group or individual video education with verbal and written material and guidebook.  Patient learns key strategies and recommendations to stay in good physical health and enhance quality of life, such as prevention strategies, having an  advocate, securing a Health Care Proxy and Power of Attorney, and keeping a list of medications and system for tracking them. It also discusses how to avoid risk for bone loss.  Biology of Weight Control  Clinical staff conducted group or individual video education with verbal and written material and guidebook.  Patient learns that weight gain occurs because we consume more calories than we burn (eating more, moving less). Even if your body weight is normal, you may have higher ratios of fat compared to muscle mass. Too much body fat puts you at increased risk for cardiovascular disease, heart attack, stroke, type 2 diabetes, and obesity-related cancers. In addition to exercise, following the Pritikin Eating Plan can help reduce your risk.  Decoding Lab Results  Clinical staff conducted group or individual video education with verbal and written material and guidebook.  Patient learns that lab test reflects one measurement whose values change over time and are influenced by many factors, including medication, stress, sleep, exercise, food, hydration, pre-existing medical conditions, and more. It is recommended to use the knowledge from this video to become more involved with your lab results and evaluate your numbers to speak with your doctor.   Diseases of Our Time - Overview  Clinical staff conducted group or individual video education with verbal and written material and guidebook.  Patient learns that according to the CDC, 50% to 70% of chronic diseases (such as obesity, type 2 diabetes, elevated lipids, hypertension, and heart disease) are avoidable through lifestyle improvements including healthier food choices, listening to satiety cues, and increased physical activity.  Sleep Disorders Clinical staff conducted group or individual video education with verbal and written material and guidebook.  Patient learns how good quality and duration of sleep are important to overall health and  well-being. Patient also learns about sleep disorders and how they impact health along with recommendations to address them, including discussing with a physician.  Nutrition  Dining Out - Part 2 Clinical staff conducted group or individual video education with verbal and written material and guidebook.  Patient learns how to plan ahead and communicate in order to maximize their dining experience in a healthy and nutritious manner. Included are recommended food choices based on the type of restaurant the patient is visiting.   Fueling a Banker conducted group or individual video education with verbal and written material and guidebook.  There is a strong connection between our food choices and our health. Diseases like obesity and type 2 diabetes are very prevalent and are in large-part due to lifestyle choices. The Pritikin Eating Plan provides plenty of food and hunger-curbing satisfaction. It is easy to follow, affordable, and helps reduce health risks.  Menu Workshop  Clinical staff conducted group or individual video education with verbal and written material and guidebook.  Patient learns that restaurant meals can sabotage health goals because they are often packed with calories, fat, sodium, and sugar. Recommendations include strategies to plan ahead and to communicate with the manager, chef, or server to help order a healthier meal.  Planning Your Eating Strategy  Clinical staff conducted group or individual video education with verbal and written material and guidebook.  Patient learns about the Pritikin Eating Plan and its benefit of reducing the risk of disease. The Pritikin Eating Plan does not focus on calories. Instead, it emphasizes high-quality, nutrient-rich foods. By knowing the characteristics of the foods, we choose, we can determine their calorie density and make informed decisions.  Targeting Your Nutrition Priorities  Clinical staff conducted group or  individual video education with verbal and written material and guidebook.  Patient learns that lifestyle habits have a tremendous impact on disease risk and progression. This video provides eating and physical activity recommendations based on your personal health goals, such as reducing LDL cholesterol, losing weight, preventing or controlling type 2 diabetes, and reducing high blood pressure.  Vitamins and Minerals  Clinical staff conducted group or individual video education with verbal and written material and guidebook.  Patient learns different ways to obtain key vitamins and minerals, including through a recommended healthy diet. It is important to discuss all supplements you take with your doctor.   Healthy Mind-Set    Smoking Cessation  Clinical staff conducted group or individual video education with verbal and written material and guidebook.  Patient learns that cigarette smoking and tobacco addiction pose a serious health risk which affects millions of people. Stopping smoking will significantly reduce the risk of heart disease, lung disease, and many forms of cancer. Recommended strategies for quitting are covered, including working with your doctor to develop a successful plan.  Culinary   Becoming a Set designer conducted group or individual video education with verbal and written material and guidebook.  Patient learns that cooking at home can be healthy, cost-effective, quick, and puts them in control. Keys to cooking healthy recipes will include looking at your recipe, assessing your equipment needs, planning ahead, making it simple, choosing cost-effective seasonal ingredients, and limiting the use of added fats, salts, and sugars.  Cooking - Breakfast and Snacks  Clinical staff conducted group or individual video education with verbal and written material and guidebook.  Patient learns how important breakfast is to satiety and nutrition through the entire  day. Recommendations include key foods to eat during breakfast to help stabilize blood sugar levels and to prevent overeating at meals later in the day. Planning  ahead is also a key component.  Cooking - Educational psychologist conducted group or individual video education with verbal and written material and guidebook.  Patient learns eating strategies to improve overall health, including an approach to cook more at home. Recommendations include thinking of animal protein as a side on your plate rather than center stage and focusing instead on lower calorie dense options like vegetables, fruits, whole grains, and plant-based proteins, such as beans. Making sauces in large quantities to freeze for later and leaving the skin on your vegetables are also recommended to maximize your experience.  Cooking - Healthy Salads and Dressing Clinical staff conducted group or individual video education with verbal and written material and guidebook.  Patient learns that vegetables, fruits, whole grains, and legumes are the foundations of the Pritikin Eating Plan. Recommendations include how to incorporate each of these in flavorful and healthy salads, and how to create homemade salad dressings. Proper handling of ingredients is also covered. Cooking - Soups and [employer] - Soups and Desserts Clinical staff conducted group or individual video education with verbal and written material and guidebook.  Patient learns that Pritikin soups and desserts make for easy, nutritious, and delicious snacks and meal components that are low in sodium, fat, sugar, and calorie density, while high in vitamins, minerals, and filling fiber. Recommendations include simple and healthy ideas for soups and desserts.   Overview     The Pritikin Solution Program Overview Clinical staff conducted group or individual video education with verbal and written material and guidebook.  Patient learns that the results of the  Pritikin Program have been documented in more than 100 articles published in peer-reviewed journals, and the benefits include reducing risk factors for (and, in some cases, even reversing) high cholesterol, high blood pressure, type 2 diabetes, obesity, and more! An overview of the three key pillars of the Pritikin Program will be covered: eating well, doing regular exercise, and having a healthy mind-set.  WORKSHOPS  Exercise: Exercise Basics: Building Your Action Plan Clinical staff led group instruction and group discussion with PowerPoint presentation and patient guidebook. To enhance the learning environment the use of posters, models and videos may be added. At the conclusion of this workshop, patients will comprehend the difference between physical activity and exercise, as well as the benefits of incorporating both, into their routine. Patients will understand the FITT (Frequency, Intensity, Time, and Type) principle and how to use it to build an exercise action plan. In addition, safety concerns and other considerations for exercise and cardiac rehab will be addressed by the presenter. The purpose of this lesson is to promote a comprehensive and effective weekly exercise routine in order to improve patients' overall level of fitness.   Managing Heart Disease: Your Path to a Healthier Heart Clinical staff led group instruction and group discussion with PowerPoint presentation and patient guidebook. To enhance the learning environment the use of posters, models and videos may be added.At the conclusion of this workshop, patients will understand the anatomy and physiology of the heart. Additionally, they will understand how Pritikin's three pillars impact the risk factors, the progression, and the management of heart disease.  The purpose of this lesson is to provide a high-level overview of the heart, heart disease, and how the Pritikin lifestyle positively impacts risk factors.  Exercise  Biomechanics Clinical staff led group instruction and group discussion with PowerPoint presentation and patient guidebook. To enhance the learning environment the use of posters, models  and videos may be added. Patients will learn how the structural parts of their bodies function and how these functions impact their daily activities, movement, and exercise. Patients will learn how to promote a neutral spine, learn how to manage pain, and identify ways to improve their physical movement in order to promote healthy living. The purpose of this lesson is to expose patients to common physical limitations that impact physical activity. Participants will learn practical ways to adapt and manage aches and pains, and to minimize their effect on regular exercise. Patients will learn how to maintain good posture while sitting, walking, and lifting.  Balance Training and Fall Prevention  Clinical staff led group instruction and group discussion with PowerPoint presentation and patient guidebook. To enhance the learning environment the use of posters, models and videos may be added. At the conclusion of this workshop, patients will understand the importance of their sensorimotor skills (vision, proprioception, and the vestibular system) in maintaining their ability to balance as they age. Patients will apply a variety of balancing exercises that are appropriate for their current level of function. Patients will understand the common causes for poor balance, possible solutions to these problems, and ways to modify their physical environment in order to minimize their fall risk. The purpose of this lesson is to teach patients about the importance of maintaining balance as they age and ways to minimize their risk of falling.  WORKSHOPS   Nutrition:  Fueling a Ship broker led group instruction and group discussion with PowerPoint presentation and patient guidebook. To enhance the learning  environment the use of posters, models and videos may be added. Patients will review the foundational principles of the Pritikin Eating Plan and understand what constitutes a serving size in each of the food groups. Patients will also learn Pritikin-friendly foods that are better choices when away from home and review make-ahead meal and snack options. Calorie density will be reviewed and applied to three nutrition priorities: weight maintenance, weight loss, and weight gain. The purpose of this lesson is to reinforce (in a group setting) the key concepts around what patients are recommended to eat and how to apply these guidelines when away from home by planning and selecting Pritikin-friendly options. Patients will understand how calorie density may be adjusted for different weight management goals.  Mindful Eating  Clinical staff led group instruction and group discussion with PowerPoint presentation and patient guidebook. To enhance the learning environment the use of posters, models and videos may be added. Patients will briefly review the concepts of the Pritikin Eating Plan and the importance of low-calorie dense foods. The concept of mindful eating will be introduced as well as the importance of paying attention to internal hunger signals. Triggers for non-hunger eating and techniques for dealing with triggers will be explored. The purpose of this lesson is to provide patients with the opportunity to review the basic principles of the Pritikin Eating Plan, discuss the value of eating mindfully and how to measure internal cues of hunger and fullness using the Hunger Scale. Patients will also discuss reasons for non-hunger eating and learn strategies to use for controlling emotional eating.  Targeting Your Nutrition Priorities Clinical staff led group instruction and group discussion with PowerPoint presentation and patient guidebook. To enhance the learning environment the use of posters, models and  videos may be added. Patients will learn how to determine their genetic susceptibility to disease by reviewing their family history. Patients will gain insight into the importance of diet  as part of an overall healthy lifestyle in mitigating the impact of genetics and other environmental insults. The purpose of this lesson is to provide patients with the opportunity to assess their personal nutrition priorities by looking at their family history, their own health history and current risk factors. Patients will also be able to discuss ways of prioritizing and modifying the Pritikin Eating Plan for their highest risk areas  Menu  Clinical staff led group instruction and group discussion with PowerPoint presentation and patient guidebook. To enhance the learning environment the use of posters, models and videos may be added. Using menus brought in from [employer], or printed from [employer], patients will apply the Pritikin dining out guidelines that were presented in the [employer] video. Patients will also be able to practice these guidelines in a variety of provided scenarios. The purpose of this lesson is to provide patients with the opportunity to practice hands-on learning of the Pritikin Dining Out guidelines with actual menus and practice scenarios.  Label Reading Clinical staff led group instruction and group discussion with PowerPoint presentation and patient guidebook. To enhance the learning environment the use of posters, models and videos may be added. Patients will review and discuss the Pritikin label reading guidelines presented in Pritikin's Label Reading Educational series video. Using fool labels brought in from local grocery stores and markets, patients will apply the label reading guidelines and determine if the packaged food meet the Pritikin guidelines. The purpose of this lesson is to provide patients with the opportunity to review, discuss, and practice  hands-on learning of the Pritikin Label Reading guidelines with actual packaged food labels. Cooking School  Pritikin's [employer] are designed to teach patients ways to prepare quick, simple, and affordable recipes at home. The importance of nutrition's role in chronic disease risk reduction is reflected in its emphasis in the overall Pritikin program. By learning how to prepare essential core Pritikin Eating Plan recipes, patients will increase control over what they eat; be able to customize the flavor of foods without the use of added salt, sugar, or fat; and improve the quality of the food they consume. By learning a set of core recipes which are easily assembled, quickly prepared, and affordable, patients are more likely to prepare more healthy foods at home. These workshops focus on convenient breakfasts, simple entres, side dishes, and desserts which can be prepared with minimal effort and are consistent with nutrition recommendations for cardiovascular risk reduction. Cooking [employer] are taught by a Armed forces logistics/support/administrative officer (RD) who has been trained by the [employer]. The chef or RD has a clear understanding of the importance of minimizing - if not completely eliminating - added fat, sugar, and sodium in recipes. Throughout the series of Cooking School Workshop sessions, patients will learn about healthy ingredients and efficient methods of cooking to build confidence in their capability to prepare    Cooking School weekly topics:  Adding Flavor- Sodium-Free  Fast and Healthy Breakfasts  Powerhouse Plant-Based Proteins  Satisfying Salads and Dressings  Simple Sides and Sauces  International Cuisine-Spotlight on the [employer] Zones  Delicious Desserts  Savory Soups  [employer] - Meals in a Astronomer Appetizers and Snacks  Comforting Weekend Breakfasts  One-Pot Wonders   Fast Evening Meals  Landscape architect Your Pritikin  Plate  WORKSHOPS   Healthy Mindset (Psychosocial):  Focused Goals, Sustainable Changes Clinical staff led group instruction and group discussion with PowerPoint  presentation and patient guidebook. To enhance the learning environment the use of posters, models and videos may be added. Patients will be able to apply effective goal setting strategies to establish at least one personal goal, and then take consistent, meaningful action toward that goal. They will learn to identify common barriers to achieving personal goals and develop strategies to overcome them. Patients will also gain an understanding of how our mind-set can impact our ability to achieve goals and the importance of cultivating a positive and growth-oriented mind-set. The purpose of this lesson is to provide patients with a deeper understanding of how to set and achieve personal goals, as well as the tools and strategies needed to overcome common obstacles which may arise along the way.  From Head to Heart: The Power of a Healthy Outlook  Clinical staff led group instruction and group discussion with PowerPoint presentation and patient guidebook. To enhance the learning environment the use of posters, models and videos may be added. Patients will be able to recognize and describe the impact of emotions and mood on physical health. They will discover the importance of self-care and explore self-care practices which may work for them. Patients will also learn how to utilize the 4 C's to cultivate a healthier outlook and better manage stress and challenges. The purpose of this lesson is to demonstrate to patients how a healthy outlook is an essential part of maintaining good health, especially as they continue their cardiac rehab journey.  Healthy Sleep for a Healthy Heart Clinical staff led group instruction and group discussion with PowerPoint presentation and patient guidebook. To enhance the learning environment the use of posters,  models and videos may be added. At the conclusion of this workshop, patients will be able to demonstrate knowledge of the importance of sleep to overall health, well-being, and quality of life. They will understand the symptoms of, and treatments for, common sleep disorders. Patients will also be able to identify daytime and nighttime behaviors which impact sleep, and they will be able to apply these tools to help manage sleep-related challenges. The purpose of this lesson is to provide patients with a general overview of sleep and outline the importance of quality sleep. Patients will learn about a few of the most common sleep disorders. Patients will also be introduced to the concept of "sleep hygiene," and discover ways to self-manage certain sleeping problems through simple daily behavior changes. Finally, the workshop will motivate patients by clarifying the links between quality sleep and their goals of heart-healthy living.   Recognizing and Reducing Stress Clinical staff led group instruction and group discussion with PowerPoint presentation and patient guidebook. To enhance the learning environment the use of posters, models and videos may be added. At the conclusion of this workshop, patients will be able to understand the types of stress reactions, differentiate between acute and chronic stress, and recognize the impact that chronic stress has on their health. They will also be able to apply different coping mechanisms, such as reframing negative self-talk. Patients will have the opportunity to practice a variety of stress management techniques, such as deep abdominal breathing, progressive muscle relaxation, and/or guided imagery.  The purpose of this lesson is to educate patients on the role of stress in their lives and to provide healthy techniques for coping with it.  Learning Barriers/Preferences:  Learning Barriers/Preferences - 06/09/22 1142       Learning Barriers/Preferences   Learning  Barriers None    Learning Preferences Written Material  Education Topics:  Knowledge Questionnaire Score:  Knowledge Questionnaire Score - 08/17/22 0941       Knowledge Questionnaire Score   Post Score 21/24             Core Components/Risk Factors/Patient Goals at Admission:  Personal Goals and Risk Factors at Admission - 06/09/22 0924       Core Components/Risk Factors/Patient Goals on Admission    Weight Management Yes;Weight Loss    Intervention Weight Management: Develop a combined nutrition and exercise program designed to reach desired caloric intake, while maintaining appropriate intake of nutrient and fiber, sodium and fats, and appropriate energy expenditure required for the weight goal.;Weight Management: Provide education and appropriate resources to help participant work on and attain dietary goals.    Admit Weight 168 lb 6.4 oz (76.4 kg)    Goal Weight: Short Term 163 lb 6.4 oz (74.1 kg)    Goal Weight: Long Term 158 lb 6.4 oz (71.8 kg)    Expected Outcomes Short Term: Continue to assess and modify interventions until short term weight is achieved;Long Term: Adherence to nutrition and physical activity/exercise program aimed toward attainment of established weight goal;Weight Loss: Understanding of general recommendations for a balanced deficit meal plan, which promotes 1-2 lb weight loss per week and includes a negative energy balance of [phone] kcal/d    Improve shortness of breath with ADL's Yes    Intervention Provide education, individualized exercise plan and daily activity instruction to help decrease symptoms of SOB with activities of daily living.    Expected Outcomes Short Term: Improve cardiorespiratory fitness to achieve a reduction of symptoms when performing ADLs;Long Term: Be able to perform more ADLs without symptoms or delay the onset of symptoms    Hypertension Yes    Intervention Provide education on lifestyle modifcations including  regular physical activity/exercise, weight management, moderate sodium restriction and increased consumption of fresh fruit, vegetables, and low fat dairy, alcohol moderation, and smoking cessation.;Monitor prescription use compliance.    Expected Outcomes Short Term: Continued assessment and intervention until BP is < 140/47mm HG in hypertensive participants. < 130/59mm HG in hypertensive participants with diabetes, heart failure or chronic kidney disease.;Long Term: Maintenance of blood pressure at goal levels.    Lipids Yes    Intervention Provide education and support for participant on nutrition & aerobic/resistive exercise along with prescribed medications to achieve LDL 70mg , HDL >40mg .    Expected Outcomes Short Term: Participant states understanding of desired cholesterol values and is compliant with medications prescribed. Participant is following exercise prescription and nutrition guidelines.;Long Term: Cholesterol controlled with medications as prescribed, with individualized exercise RX and with personalized nutrition plan. Value goals: LDL < 70mg , HDL > 40 mg.    Personal Goal Other Yes    Personal Goal Be more aware of what she eats to help with weight loss. Know parameters for exercise.    Intervention Provide education and support for participant on nutrition and aerobic/resistive exercise to help achieve weight loss goals. Provide individualized exercise prescription with parameters that patient can follow.    Expected Outcomes Patient will be compliant with exercise and nutrition plan.             Core Components/Risk Factors/Patient Goals Review:   Goals and Risk Factor Review     Row Name 06/15/22 1646 07/27/22 0825 08/19/22 1508         Core Components/Risk Factors/Patient Goals Review   Personal Goals Review Weight Management/Obesity;Stress;Lipids;Hypertension Weight Management/Obesity;Stress;Lipids;Hypertension Weight Management/Obesity;Stress;Lipids;Hypertension  Review Torrey started intesice cardiac rehab on 06/15/22 and did well with exercise. Vital signs were stable Natessa is doing well with exercise at  intensive cardiac rehab. Vital signs have been stable. [place] has lost 2.4 kg since starting the program Emiya is doing well with exercise at  intensive cardiac rehab. Vital signs have been stable. Eslie has lost 2.4 kg since starting the program. Jazzlynn will complete intensive cardiac rehab on 08/21/22     Expected Outcomes Harkiran will continue to participte in intesive cardiac rehab for exercise, nutrition and lifestyle modifications Destane will continue to participte in intesive cardiac rehab for exercise, nutrition and lifestyle modifications Dartha will continue exercise, follow  nutrition and lifestyle modifications upon completion of intensive cardiac rehab              Core Components/Risk Factors/Patient Goals at Discharge (Final Review):   Goals and Risk Factor Review - 08/19/22 1508       Core Components/Risk Factors/Patient Goals Review   Personal Goals Review Weight Management/Obesity;Stress;Lipids;Hypertension    Review Toscha is doing well with exercise at  intensive cardiac rehab. Vital signs have been stable. Robbyn has lost 2.4 kg since starting the program. Yana will complete intensive cardiac rehab on 08/21/22    Expected Outcomes Lashundria will continue exercise, follow  nutrition and lifestyle modifications upon completion of intensive cardiac rehab             ITP Comments:  ITP Comments     Row Name 06/09/22 0906 06/15/22 1642 07/27/22 0824 08/19/22 1505     ITP Comments Medical Director- Dr. Armanda Magic, MD. Introduction to the [facility] Program/Intensive Cardiac Rehab. Reviewed initial orientation packet with patient. 30 Day ITP Review. Jerelene started intensive cardiac rehab on 06/15/22 amd did well with exercise 30 Day ITP Review. Jeweldine has good attendance and participation in   intensive cardiac rehab 30 Day ITP Review. Seville has good attendance and participation in  intensive cardiac rehab. Arvella will complete intensive cardiac rehab on 08/21/22             Comments: See ITP comments.Thayer Headings RN BSN

## 2022-08-19 NOTE — Patient Instructions (Signed)
Medication Instructions:  Your physician recommends that you continue on your current medications as directed. Please refer to the Current Medication list given to you today.  *If you need a refill on your cardiac medications before your next appointment, please call your pharmacy*   Lab Work: None ordered  If you have labs (blood work) drawn today and your tests are completely normal, you will receive your results only by: MyChart Message (if you have MyChart) OR A paper copy in the mail If you have any lab test that is abnormal or we need to change your treatment, we will call you to review the results.   Testing/Procedures: None ordered    Other Instructions

## 2022-08-21 ENCOUNTER — Encounter (HOSPITAL_COMMUNITY): Payer: Medicare Other

## 2022-08-21 ENCOUNTER — Encounter (HOSPITAL_COMMUNITY)
Admission: RE | Admit: 2022-08-21 | Discharge: 2022-08-21 | Disposition: A | Discharge status: Home / Self Care | Payer: Medicare Other | Source: Ambulatory Visit | Attending: Internal Medicine | Admitting: Internal Medicine

## 2022-08-21 DIAGNOSIS — Z952 Presence of prosthetic heart valve: Secondary | ICD-10-CM

## 2022-08-24 DIAGNOSIS — Z96653 Presence of artificial knee joint, bilateral: Secondary | ICD-10-CM | POA: Diagnosis not present

## 2022-08-24 DIAGNOSIS — M546 Pain in thoracic spine: Secondary | ICD-10-CM | POA: Diagnosis not present

## 2022-08-24 NOTE — Progress Notes (Signed)
Discharge Progress Report  Patient Details  Name: Marissa Powers MRN: [id number] Date of Birth: [birth year] Referring Provider:   Flowsheet Row INTENSIVE CARDIAC REHAB ORIENT from 06/09/2022 in [facility]  Referring Provider Bensimhon, Bevelyn Buckles, MD        Number of Visits: 6  Reason for Discharge:  Patient reached a stable level of exercise. Patient independent in their exercise. Patient has met program and personal goals.  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)  ADL [facility]SD:   Initial Exercise Prescription:  Initial Exercise Prescription - 06/09/22 1100       Date of Initial Exercise RX and Referring Provider   Date 06/09/22    Referring Provider Dolores Patty, MD    Expected Discharge Date 08/21/22      NuStep   Level 2    SPM 85    Minutes 15    METs 2.5      Recumbant Elliptical   Level 1    Minutes 15    METs 2.8      Prescription Details   Frequency (times per week) 3    Duration Progress to 30 minutes of continuous aerobic without signs/symptoms of physical distress      Intensity   THRR 40-80% of Max Heartrate 56-111    Ratings of Perceived Exertion 11-13    Perceived Dyspnea 0-4      Progression   Progression Continue to progress workloads to maintain intensity without signs/symptoms of physical distress.      Resistance Training   Training Prescription Yes    Weight 2 lbs    Reps 10-15             Discharge Exercise Prescription (Final Exercise Prescription Changes):  Exercise Prescription Changes - 08/21/22 0830       Response to Exercise   Blood Pressure (Admit) 118/72    Blood Pressure (Exercise) 120/62    Blood Pressure (Exit) 106/62    Heart Rate (Admit) 79 bpm    Heart Rate (Exercise) 100 bpm    Heart Rate  (Exit) 68 bpm    Rating of Perceived Exertion (Exercise) 13    Perceived Dyspnea (Exercise) 0    Symptoms 0    Comments Pt Graduated the ICR [facility] program    Duration Continue with 30 min of aerobic exercise without signs/symptoms of physical distress.    Intensity THRR unchanged      Progression   Progression Continue to progress workloads to maintain intensity without signs/symptoms of physical distress.    Average METs 4.38      Resistance Training   Training Prescription Yes    Weight 2 lbs wts    Reps 10-15    Time 10 Minutes      Recumbant Elliptical   Level 1    RPM 75    Watts 103    Minutes 15    METs 5.3      Track   Laps 7   1690 ft   Minutes 6    METs 3.45      Home Exercise Plan   Plans to continue exercise at Home (comment)    Frequency Add 3 additional days to program exercise sessions.    Initial Home Exercises Provided 07/10/22  Functional Capacity:  6 Minute Walk     Row Name 06/09/22 1015 08/21/22 0830       6 Minute Walk   Phase Initial Discharge    Distance 1523 feet 1690 feet    Distance % Change -- 10.97 %    Distance Feet Change -- 167 ft    Walk Time 6 minutes 6 minutes    # of Rest Breaks 0 0    MPH 2.88 2.86    METS 2.81 3.2    RPE 11 11    Perceived Dyspnea  1 0    VO2 Peak 9.85 10.02    Symptoms Yes (comment) No    Comments Mild SOB, RPD=1. --    Resting HR 57 bpm 79 bpm    Resting BP 138/88 118/72    Resting Oxygen Saturation  97 % --    Exercise Oxygen Saturation  during 6 min walk 95 % --    Max Ex. HR 88 bpm 93 bpm    Max Ex. BP 168/92 120/62    2 Minute Post BP 142/88 112/62             Psychological, QOL, Others - Outcomes: PHQ 2/9:    08/19/2022    8:25 AM 07/30/2022   10:31 AM 06/09/2022   11:20 AM 07/17/2021    9:25 AM  Depression screen PHQ 2/9  Decreased Interest 0 0 0 0  Down, Depressed, Hopeless 0 0 0 1  PHQ - 2 Score 0 0 0 1  Altered sleeping 0  0   Tired, decreased energy 1  1    Change in appetite 0  0   Feeling bad or failure about yourself  0  0   Trouble concentrating 0  0   Moving slowly or fidgety/restless 0  0   Suicidal thoughts 0  0   PHQ-9 Score 1  1   Difficult doing work/chores Not difficult at all  Not difficult at all     Quality of Life:  Quality of Life - 08/17/22 0938       Quality of Life   Select Quality of Life      Quality of Life Scores   Health/Function Post 22.15 %    Socioeconomic Post 30 %    Psych/Spiritual Post 28.29 %    Family Post 28.5 %    GLOBAL Pre 26 %             Personal Goals: Goals established at orientation with interventions provided to work toward goal.  Personal Goals and Risk Factors at Admission - 06/09/22 0924       Core Components/Risk Factors/Patient Goals on Admission    Weight Management Yes;Weight Loss    Intervention Weight Management: Develop a combined nutrition and exercise program designed to reach desired caloric intake, while maintaining appropriate intake of nutrient and fiber, sodium and fats, and appropriate energy expenditure required for the weight goal.;Weight Management: Provide education and appropriate resources to help participant work on and attain dietary goals.    Admit Weight 168 lb 6.4 oz (76.4 kg)    Goal Weight: Short Term 163 lb 6.4 oz (74.1 kg)    Goal Weight: Long Term 158 lb 6.4 oz (71.8 kg)    Expected Outcomes Short Term: Continue to assess and modify interventions until short term weight is achieved;Long Term: Adherence to nutrition and physical activity/exercise program aimed toward attainment of established weight goal;Weight Loss: Understanding of general recommendations for a  balanced deficit meal plan, which promotes 1-2 lb weight loss per week and includes a negative energy balance of [phone] kcal/d    Improve shortness of breath with ADL's Yes    Intervention Provide education, individualized exercise plan and daily activity instruction to help decrease  symptoms of SOB with activities of daily living.    Expected Outcomes Short Term: Improve cardiorespiratory fitness to achieve a reduction of symptoms when performing ADLs;Long Term: Be able to perform more ADLs without symptoms or delay the onset of symptoms    Hypertension Yes    Intervention Provide education on lifestyle modifcations including regular physical activity/exercise, weight management, moderate sodium restriction and increased consumption of fresh fruit, vegetables, and low fat dairy, alcohol moderation, and smoking cessation.;Monitor prescription use compliance.    Expected Outcomes Short Term: Continued assessment and intervention until BP is < 140/70mm HG in hypertensive participants. < 130/48mm HG in hypertensive participants with diabetes, heart failure or chronic kidney disease.;Long Term: Maintenance of blood pressure at goal levels.    Lipids Yes    Intervention Provide education and support for participant on nutrition & aerobic/resistive exercise along with prescribed medications to achieve LDL 70mg , HDL >40mg .    Expected Outcomes Short Term: Participant states understanding of desired cholesterol values and is compliant with medications prescribed. Participant is following exercise prescription and nutrition guidelines.;Long Term: Cholesterol controlled with medications as prescribed, with individualized exercise RX and with personalized nutrition plan. Value goals: LDL < 70mg , HDL > 40 mg.    Personal Goal Other Yes    Personal Goal Be more aware of what she eats to help with weight loss. Know parameters for exercise.    Intervention Provide education and support for participant on nutrition and aerobic/resistive exercise to help achieve weight loss goals. Provide individualized exercise prescription with parameters that patient can follow.    Expected Outcomes Patient will be compliant with exercise and nutrition plan.              Personal Goals Discharge:  Goals  and Risk Factor Review     Row Name 06/15/22 1646 07/27/22 0825 08/19/22 1508         Core Components/Risk Factors/Patient Goals Review   Personal Goals Review Weight Management/Obesity;Stress;Lipids;Hypertension Weight Management/Obesity;Stress;Lipids;Hypertension Weight Management/Obesity;Stress;Lipids;Hypertension     Review Ramatoulaye started intesice cardiac rehab on 06/15/22 and did well with exercise. Vital signs were stable Kimberlin is doing well with exercise at  intensive cardiac rehab. Vital signs have been stable. Katalia has lost 2.4 kg since starting the program Shaleigh is doing well with exercise at  intensive cardiac rehab. Vital signs have been stable. Jana has lost 2.4 kg since starting the program. Lolitha will complete intensive cardiac rehab on 08/21/22     Expected Outcomes Asianae will continue to participte in intesive cardiac rehab for exercise, nutrition and lifestyle modifications Kayden will continue to participte in intesive cardiac rehab for exercise, nutrition and lifestyle modifications Dajonae will continue exercise, follow  nutrition and lifestyle modifications upon completion of intensive cardiac rehab              Exercise Goals and Review:  Exercise Goals     Row Name 06/09/22 1610             Exercise Goals   Increase Physical Activity Yes       Intervention Provide advice, education, support and counseling about physical activity/exercise needs.;Develop an individualized exercise prescription for aerobic and resistive training based on initial evaluation findings, risk  stratification, comorbidities and participant's personal goals.       Expected Outcomes Short Term: Attend rehab on a regular basis to increase amount of physical activity.;Long Term: Add in home exercise to make exercise part of routine and to increase amount of physical activity.;Long Term: Exercising regularly at least 3-5 days a week.       Increase Strength and Stamina Yes        Intervention Provide advice, education, support and counseling about physical activity/exercise needs.;Develop an individualized exercise prescription for aerobic and resistive training based on initial evaluation findings, risk stratification, comorbidities and participant's personal goals.       Expected Outcomes Short Term: Increase workloads from initial exercise prescription for resistance, speed, and METs.;Short Term: Perform resistance training exercises routinely during rehab and add in resistance training at home;Long Term: Improve cardiorespiratory fitness, muscular endurance and strength as measured by increased METs and functional capacity ( )       Able to understand and use rate of perceived exertion (RPE) scale Yes       Intervention Provide education and explanation on how to use RPE scale       Expected Outcomes Short Term: Able to use RPE daily in rehab to express subjective intensity level;Long Term:  Able to use RPE to guide intensity level when exercising independently       Knowledge and understanding of Target Heart Rate Range (THRR) Yes       Intervention Provide education and explanation of THRR including how the numbers were predicted and where they are located for reference       Expected Outcomes Short Term: Able to state/look up THRR;Long Term: Able to use THRR to govern intensity when exercising independently;Short Term: Able to use daily as guideline for intensity in rehab       Able to check pulse independently Yes       Intervention Provide education and demonstration on how to check pulse in carotid and radial arteries.;Review the importance of being able to check your own pulse for safety during independent exercise       Expected Outcomes Long Term: Able to check pulse independently and accurately;Short Term: Able to explain why pulse checking is important during independent exercise       Understanding of Exercise Prescription Yes       Intervention Provide  education, explanation, and written materials on patient's individual exercise prescription       Expected Outcomes Short Term: Able to explain program exercise prescription;Long Term: Able to explain home exercise prescription to exercise independently                Exercise Goals Re-Evaluation:  Exercise Goals Re-Evaluation     Row Name 06/15/22 1610 06/26/22 0834 07/10/22 0821 08/05/22 0839 08/21/22 0830     Exercise Goal Re-Evaluation   Exercise Goals Review Increase Physical Activity;Understanding of Exercise Prescription;Increase Strength and Stamina;Knowledge and understanding of Target Heart Rate Range (THRR);Able to understand and use rate of perceived exertion (RPE) scale Increase Physical Activity;Understanding of Exercise Prescription;Increase Strength and Stamina;Knowledge and understanding of Target Heart Rate Range (THRR);Able to understand and use rate of perceived exertion (RPE) scale Increase Physical Activity;Understanding of Exercise Prescription;Increase Strength and Stamina;Knowledge and understanding of Target Heart Rate Range (THRR);Able to understand and use rate of perceived exertion (RPE) scale Increase Physical Activity;Able to understand and use Dyspnea scale;Understanding of Exercise Prescription;Increase Strength and Stamina;Knowledge and understanding of Target Heart Rate Range (THRR);Able to understand and use rate of  perceived exertion (RPE) scale Increase Physical Activity;Able to understand and use Dyspnea scale;Understanding of Exercise Prescription;Increase Strength and Stamina;Knowledge and understanding of Target Heart Rate Range (THRR);Able to understand and use rate of perceived exertion (RPE) scale   Comments Pt first day in the [facility] I[facility] program. Pt tolerated exercise well with an average MET level of 2.7. Pt is learning her THRR, RPE and ExRx Reviewed MET's and goals with pt today. Pt tolerated exercise well with an average MET level of 3.9. Pt is  having continued concerns with increased fatigue, evening GERD symptoms and SOB with exercise, referred to nurse. Did not review home exercise due to symptoms and fatigue with current exercise plan. Reviewed MET's, goals and home ExRx. Pt tolerated exercise well with an average MET level of 3.8. Pt is feeling stonger and states she has more energy on the days when she exercises. Still working on eating better and her wt loss, but she feels she has the resources she needs and is Administrator, arts better choices. Pt is seeing a personal trainer 1 day a week and walking her dog for long walks 1 day a week. encouraged one extra day of a long walk with her dog so that she gets 3 days on her own of 30-45 mins. Reviewed METs and goals with pt today. Pt is tolerating execise with an avg MET level of 4.2. She continues to show good progression within the program. She feels good about her goals of understanding exercise parameters and how to make an exercise plan. She has been walking at home and feels she is able to walk faster and longer. She still feels SOB at times throughout the day and said she will be doing a pulmonary function test soon with her Dr. and is looking forward to the results. Pt graduated the [facility] program. Pt is tolerating execise with an avg MET level of 4.38. She will continue to exercise on her own by walking and seeing her personal trainer 4-6 days for 30-45 mins.   Expected Outcomes Will continue to monitor pt and progress workloads as tolerated without sign or symptom Will continue to monitor pt and progress workloads as tolerated without sign or symptom Will continue to monitor pt and progress workloads as tolerated without sign or symptom Will continue to monitor and progress workloads as tolerated without s/sx. Pt will continue to exercise on her own and gain strength            Nutrition & Weight - Outcomes:  Pre Biometrics - 06/09/22 0906       Pre Biometrics   Waist Circumference  38 inches    Hip Circumference 41.75 inches    Waist to Hip Ratio 0.91 %    Triceps Skinfold 25 mm    % Body Fat 39.9 %    Grip Strength 23 kg    Flexibility 0 in    Single Leg Stand 30 seconds              Nutrition:  Nutrition Therapy & Goals - 08/14/22 1033       Nutrition Therapy   Diet Heart Healthy Diet      Personal Nutrition Goals   Nutrition Goal Patient to identify strategies for reducing cardiovascular risk by attending the weekly [facility] education and nutrition series    Personal Goal #2 Patient to improve diet quality by using the plate method as a daily guide for meal planning to include lean protein/plant protein, fruits, vegetables, whole grains, nonfat  dairy as part of well balanced diet    Personal Goal #3 Patient to reduce sodium intake to 1500mg  per day    Personal Goal #4 Patient to identify strategies for weight loss of 0.5-2.0# per week.    Comments Goals in action. She continues to attend the [facility] education and nutrition series regularly. Phala lives alone and does infrequent cooking. She continues Repatha to aid with LDL goal <70. Per documenation from Heart failure clinic on 05/06/2022, recommend weight loss of 10-15# to aid with DOE. She is down 7.5# since starting with our program. She reports increased awareness/ improved knowledge of appropriate dietary changes; however, she continues to admit that there is additional room for improvement regarding the consistency of these changes. Nayana will continue to benefit from participation in intensive cardiac rehab for nutrition, exercise, and lifestyle modification.      Intervention Plan   Intervention Prescribe, educate and counsel regarding individualized specific dietary modifications aiming towards targeted core components such as weight, hypertension, lipid management, diabetes, heart failure and other comorbidities.;Nutrition handout(s) given to patient.    Expected Outcomes Long Term Goal:  Adherence to prescribed nutrition plan.;Short Term Goal: A plan has been developed with personal nutrition goals set during dietitian appointment.;Short Term Goal: Understand basic principles of dietary content, such as calories, fat, sodium, cholesterol and nutrients.             Nutrition Discharge:  Nutrition Assessments - 08/24/22 0946       Rate Your Plate Scores   Post Score 65             Education Questionnaire Score:  Knowledge Questionnaire Score - 08/17/22 0941       Knowledge Questionnaire Score   Post Score 21/24             Goals reviewed with patient; copy given to patient. Patient graduated from cardiac rehab program on 08-21-22 with completion of  61 exercise and education sessions. Pt maintained good attendance and progressed nicely during her participation in rehab as evidenced by increased MET level. Medication list reconciled. Repeat  PHQ9 score=1.  Pt has made lifestyle changes and should be commended for her success. Raynelle Fanning achieved her goals during cardiac rehab.

## 2022-09-01 ENCOUNTER — Other Ambulatory Visit (HOSPITAL_COMMUNITY): Payer: Self-pay | Admitting: Internal Medicine

## 2022-10-23 DIAGNOSIS — R7301 Impaired fasting glucose: Secondary | ICD-10-CM | POA: Diagnosis not present

## 2022-10-23 DIAGNOSIS — I2510 Atherosclerotic heart disease of native coronary artery without angina pectoris: Secondary | ICD-10-CM | POA: Diagnosis not present

## 2022-10-23 DIAGNOSIS — E538 Deficiency of other specified B group vitamins: Secondary | ICD-10-CM | POA: Diagnosis not present

## 2022-10-23 DIAGNOSIS — Z170 Estrogen receptor positive status [ER+]: Secondary | ICD-10-CM | POA: Diagnosis not present

## 2022-10-23 DIAGNOSIS — D649 Anemia, unspecified: Secondary | ICD-10-CM | POA: Diagnosis not present

## 2022-10-23 DIAGNOSIS — M8580 Other specified disorders of bone density and structure, unspecified site: Secondary | ICD-10-CM | POA: Diagnosis not present

## 2022-10-23 DIAGNOSIS — E785 Hyperlipidemia, unspecified: Secondary | ICD-10-CM | POA: Diagnosis not present

## 2022-10-23 DIAGNOSIS — I10 Essential (primary) hypertension: Secondary | ICD-10-CM | POA: Diagnosis not present

## 2022-10-23 DIAGNOSIS — I480 Paroxysmal atrial fibrillation: Secondary | ICD-10-CM | POA: Diagnosis not present

## 2022-10-23 DIAGNOSIS — M353 Polymyalgia rheumatica: Secondary | ICD-10-CM | POA: Diagnosis not present

## 2022-10-23 DIAGNOSIS — I350 Nonrheumatic aortic (valve) stenosis: Secondary | ICD-10-CM | POA: Diagnosis not present

## 2022-10-23 DIAGNOSIS — C50512 Malignant neoplasm of lower-outer quadrant of left female breast: Secondary | ICD-10-CM | POA: Diagnosis not present

## 2022-11-05 ENCOUNTER — Ambulatory Visit (INDEPENDENT_AMBULATORY_CARE_PROVIDER_SITE_OTHER): Payer: Medicare Other

## 2022-11-05 VITALS — BP 133/79 | HR 77 | Temp 97.5°F | Resp 20 | Ht 65.0 in

## 2022-11-05 DIAGNOSIS — M810 Age-related osteoporosis without current pathological fracture: Secondary | ICD-10-CM | POA: Diagnosis not present

## 2022-11-05 MED ORDER — DENOSUMAB 60 MG/ML ~~LOC~~ SOSY
60.0000 mg | PREFILLED_SYRINGE | Freq: Once | SUBCUTANEOUS | Status: AC
  Administered 2022-11-05: 60 mg via SUBCUTANEOUS

## 2022-11-05 NOTE — Progress Notes (Signed)
Diagnosis: Osteoporosis  Provider:  Chilton Greathouse MD  Procedure: Injection  Prolia (Denosumab), Dose: 60 mg, Site: subcutaneous, Number of injections: 1  Administered in right arm.  Post Care: Patient declined observation  Discharge: Condition: Good, Destination: Home . AVS Declined  Performed by:  Wyvonne Lenz, RN

## 2022-12-21 NOTE — Progress Notes (Signed)
Patient Care Team: Rodrigo Ran, MD as PCP - General (Internal Medicine) Serena Croissant, MD as Consulting Physician (Hematology and Oncology) Emelia Loron, MD as Consulting Physician (General Surgery) Dorothy Puffer, MD as Consulting Physician (Radiation Oncology) Bensimhon, Bevelyn Buckles, MD as Consulting Physician (Cardiology) Jerene Bears, MD as Consulting Physician (Obstetrics and Gynecology)  DIAGNOSIS: No diagnosis found.  SUMMARY OF ONCOLOGIC HISTORY: Oncology History  Malignant neoplasm of lower-outer quadrant of left breast of female, estrogen receptor positive (HCC)  07/18/2021 Initial Diagnosis   Left breast global asymmetry/density measuring 8 x 4.4 x 4.7 cm.  Ultrasound showed 2 focal masses.  3.1 cm: Biopsy grade 2 ILC ER 90%, PR 10%, HER2 equivocal, FISH negative, Ki-67 10%; lymph node biopsy: Positive Breast MRI abnormality left breast measured 8 x 4.2 x 4.1 cm    Genetic Testing   [facility] CancerNext Panel was Negative. 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 Report date is 09/11/2021.  The CancerNext gene panel offered by [employer] includes sequencing, rearrangement analysis, and RNA analysis for the following 36 genes:   APC, ATM, AXIN2, BARD1, BMPR1A, BRCA1, BRCA2, BRIP1, CDH1, CDK4, CDKN2A, CHEK2, DICER1, HOXB13, EPCAM, GREM1, MLH1, MSH2, MSH3, MSH6, MUTYH, NBN, NF1, NTHL1, PALB2, PMS2, POLD1, POLE, PTEN, RAD51C, RAD51D, RECQL, SMAD4, SMARCA4, STK11, and TP53.    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, look inflamed for vascular invasion present, ER 90%, PR 10%, HER2 negative, Ki-67 10%   01/07/2022 Cancer Staging   Staging form:  Breast, AJCC 8th Edition - Pathologic: Stage IB (pT3, pN1, cM0, G2, ER+, PR+, HER2-) - Signed by Serena Croissant, MD on 01/07/2022 Stage prefix: Initial diagnosis Histologic grading system: 3 grade system   02/05/2022 - 03/25/2022 Radiation Therapy   Site Technique Total Dose (Gy) Dose per Fx (Gy) Completed Fx Beam Energies  Breast, Left: Breast_L 3D 50.4/50.4 1.8 28/28 10XFFF  Breast, Left: Breast_L_SCV_PAB 3D 50.4/50.4 1.8 28/28 6X, 10X  Breast, Left: Breast_L_Bst 3D 10/10 2 5/5 6X, 10X       CHIEF COMPLIANT: Follow-up Letrozole  INTERVAL HISTORY: Marissa Powers is a 82 y.o. with the above mentioned left breast cancer. She presents to the clinic for a follow-up    ALLERGIES:  is allergic to cashew nut oil, cashew nut (anacardium occidentale) skin test, and other.  MEDICATIONS:  Current Outpatient Medications  Medication Sig Dispense Refill   acetaminophen (TYLENOL) 500 MG tablet Take 1,000 mg by mouth every 6 (six) hours as needed for moderate pain.     apixaban (ELIQUIS) 5 MG TABS tablet Take 1 tablet (5 mg total) by mouth 2 (two) times daily. 60 tablet 11   b complex vitamins capsule Take 1 capsule by mouth daily.     Calcium Carb-Cholecalciferol (CALCIUM + D3 PO) Take 1 tablet by mouth in the morning.     cholecalciferol (VITAMIN D3) 25 MCG (1000 UNIT) tablet Take 1,000 Units by mouth in the morning.     dapagliflozin propanediol (FARXIGA) 10 MG TABS tablet Take 1 tablet (10 mg total) by mouth daily before breakfast. 30 tablet 11   denosumab (PROLIA) 60 MG/ML SOSY injection Inject 60 mg into the skin every 6 (six) months.     dicyclomine (BENTYL) 10 MG  capsule Take 10-20 mg by mouth 3 (three) times daily as needed for spasms.     folic acid (FOLVITE) 1 MG tablet Take 2 mg by mouth in the morning.     letrozole (FEMARA) 2.5 MG tablet TAKE 1 TABLET(2.5 MG) BY MOUTH DAILY 90 tablet 3   levothyroxine (SYNTHROID) 100 MCG tablet Take 100 mcg by mouth daily before breakfast.      loperamide (IMODIUM) 2 MG capsule Take 2-4 mg by mouth 4 (four) times daily as needed (upset stomach/diarrhea/loose stools).     metoprolol succinate (TOPROL XL) 25 MG 24 hr tablet Take 1 tablet (25 mg total) by mouth daily. 90 tablet 3   nitroGLYCERIN (NITROSTAT) 0.4 MG SL tablet Place 1 tablet (0.4 mg total) under the tongue every 5 (five) minutes as needed for chest pain. 25 tablet 3   REPATHA SURECLICK 140 MG/ML SOAJ Inject 140 mg into the skin every 14 (fourteen) days.     sacubitril-valsartan (ENTRESTO) 49-51 MG TAKE 1 TABLET BY MOUTH TWICE DAILY 60 tablet 11   traMADol (ULTRAM) 50 MG tablet Take 1 tablet (50 mg total) by mouth every 6 (six) hours as needed. 10 tablet 0   zolpidem (AMBIEN) 5 MG tablet Take 2.5 mg by mouth at bedtime.     No current facility-administered medications for this visit.    PHYSICAL EXAMINATION: ECOG PERFORMANCE STATUS: {CHL ONC ECOG [phone]}  There were no vitals filed for this visit. There were no vitals filed for this visit.  BREAST:*** No palpable masses or nodules in either right or left breasts. No palpable axillary supraclavicular or infraclavicular adenopathy no breast tenderness or nipple discharge. (exam performed in the presence of a chaperone)  LABORATORY DATA:  I have reviewed the data as listed    Latest Ref Rng & Units 03/20/2022    9:31 AM 12/26/2021   12:08 PM 08/20/2021    1:59 AM  CMP  Glucose 70 - 99 mg/dL 161  096  045   BUN 8 - 27 mg/dL 12  12  6    Creatinine 0.57 - 1.00 mg/dL 4.09  8.11  9.14   Sodium 134 - 144 mmol/L 141  141  139   Potassium 3.5 - 5.2 mmol/L 4.1  4.0  3.8   Chloride 96 - 106 mmol/L 106  109  112   CO2 20 - 29 mmol/L 22  21  23    Calcium 8.7 - 10.3 mg/dL 9.4  9.7  8.4     Lab Results  Component Value Date   WBC 6.6 12/25/2021   HGB 11.3 (L) 12/25/2021   HCT 34.9 (L) 12/25/2021   MCV 109.4 (H) 12/25/2021   PLT 219 12/25/2021   NEUTROABS 5.2 07/13/2021    ASSESSMENT & PLAN:  No problem-specific  Assessment & Plan notes found for this encounter.    No orders of the defined types were placed in this encounter.  The patient has a good understanding of the overall plan. she agrees with it. she will call with any problems that may develop before the next visit here. Total time spent: 30 mins including face to face time and time spent for planning, charting and co-ordination of care   Sherlyn Lick, CMA 12/21/22    I Janan Ridge am acting as a Neurosurgeon for [employer]  ***

## 2022-12-24 ENCOUNTER — Inpatient Hospital Stay: Payer: Medicare Other | Attending: Hematology and Oncology | Admitting: Hematology and Oncology

## 2022-12-24 VITALS — BP 149/77 | HR 58 | Temp 97.2°F | Resp 18 | Ht 65.0 in | Wt 157.5 lb

## 2022-12-24 DIAGNOSIS — Z923 Personal history of irradiation: Secondary | ICD-10-CM | POA: Insufficient documentation

## 2022-12-24 DIAGNOSIS — Z79811 Long term (current) use of aromatase inhibitors: Secondary | ICD-10-CM | POA: Diagnosis not present

## 2022-12-24 DIAGNOSIS — C50512 Malignant neoplasm of lower-outer quadrant of left female breast: Secondary | ICD-10-CM | POA: Insufficient documentation

## 2022-12-24 DIAGNOSIS — Z170 Estrogen receptor positive status [ER+]: Secondary | ICD-10-CM | POA: Insufficient documentation

## 2022-12-24 DIAGNOSIS — Z79899 Other long term (current) drug therapy: Secondary | ICD-10-CM | POA: Diagnosis not present

## 2022-12-24 NOTE — Assessment & Plan Note (Signed)
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  Adjuvant radiation therapy: 02/06/2022-03/26/2022 2. antiestrogen therapy: Started as neoadjuvant therapy with letrozole.  She tolerated it very well.  She will continue on with the same.  Started 07/30/2021 -------------------------------------------------------------------------------------------------------------------------- Letrozole toxicities: Tolerating it extremely well without any side effects.  Denies any hot flashes. Breast cancer surveillance: Breast exam 12/24/2022: Benign Mammogram  Return to clinic in 1 year for follow-up

## 2022-12-25 DIAGNOSIS — N6320 Unspecified lump in the left breast, unspecified quadrant: Secondary | ICD-10-CM | POA: Diagnosis not present

## 2022-12-25 DIAGNOSIS — R922 Inconclusive mammogram: Secondary | ICD-10-CM | POA: Diagnosis not present

## 2022-12-27 DIAGNOSIS — Z23 Encounter for immunization: Secondary | ICD-10-CM | POA: Diagnosis not present

## 2022-12-28 ENCOUNTER — Encounter: Payer: Self-pay | Admitting: Adult Health

## 2023-01-11 DIAGNOSIS — M353 Polymyalgia rheumatica: Secondary | ICD-10-CM | POA: Diagnosis not present

## 2023-01-11 DIAGNOSIS — K589 Irritable bowel syndrome without diarrhea: Secondary | ICD-10-CM | POA: Diagnosis not present

## 2023-01-11 DIAGNOSIS — R1030 Lower abdominal pain, unspecified: Secondary | ICD-10-CM | POA: Diagnosis not present

## 2023-01-11 DIAGNOSIS — M791 Myalgia, unspecified site: Secondary | ICD-10-CM | POA: Diagnosis not present

## 2023-01-11 DIAGNOSIS — K5909 Other constipation: Secondary | ICD-10-CM | POA: Diagnosis not present

## 2023-01-11 DIAGNOSIS — Z8719 Personal history of other diseases of the digestive system: Secondary | ICD-10-CM | POA: Diagnosis not present

## 2023-01-13 DIAGNOSIS — C50512 Malignant neoplasm of lower-outer quadrant of left female breast: Secondary | ICD-10-CM | POA: Diagnosis not present

## 2023-01-13 DIAGNOSIS — Z170 Estrogen receptor positive status [ER+]: Secondary | ICD-10-CM | POA: Diagnosis not present

## 2023-01-27 ENCOUNTER — Other Ambulatory Visit: Payer: Self-pay

## 2023-01-27 MED ORDER — DAPAGLIFLOZIN PROPANEDIOL 10 MG PO TABS: 10.0000 mg | ORAL_TABLET | Freq: Every day | ORAL | 7 refills | Status: DC

## 2023-01-31 DIAGNOSIS — Z23 Encounter for immunization: Secondary | ICD-10-CM | POA: Diagnosis not present

## 2023-02-09 DIAGNOSIS — M549 Dorsalgia, unspecified: Secondary | ICD-10-CM | POA: Diagnosis not present

## 2023-02-09 DIAGNOSIS — Z7952 Long term (current) use of systemic steroids: Secondary | ICD-10-CM | POA: Diagnosis not present

## 2023-02-09 DIAGNOSIS — Z6825 Body mass index (BMI) 25.0-25.9, adult: Secondary | ICD-10-CM | POA: Diagnosis not present

## 2023-02-09 DIAGNOSIS — M8580 Other specified disorders of bone density and structure, unspecified site: Secondary | ICD-10-CM | POA: Diagnosis not present

## 2023-02-09 DIAGNOSIS — M353 Polymyalgia rheumatica: Secondary | ICD-10-CM | POA: Diagnosis not present

## 2023-02-09 DIAGNOSIS — M316 Other giant cell arteritis: Secondary | ICD-10-CM | POA: Diagnosis not present

## 2023-02-09 DIAGNOSIS — E663 Overweight: Secondary | ICD-10-CM | POA: Diagnosis not present

## 2023-02-16 ENCOUNTER — Ambulatory Visit: Payer: Medicare Other | Attending: Physician Assistant | Admitting: Physician Assistant

## 2023-02-16 ENCOUNTER — Encounter: Payer: Self-pay | Admitting: Physician Assistant

## 2023-02-16 VITALS — BP 136/80 | HR 58 | Ht 65.0 in | Wt 156.0 lb

## 2023-02-16 DIAGNOSIS — I480 Paroxysmal atrial fibrillation: Secondary | ICD-10-CM | POA: Insufficient documentation

## 2023-02-16 DIAGNOSIS — I10 Essential (primary) hypertension: Secondary | ICD-10-CM | POA: Insufficient documentation

## 2023-02-16 DIAGNOSIS — I5032 Chronic diastolic (congestive) heart failure: Secondary | ICD-10-CM | POA: Insufficient documentation

## 2023-02-16 DIAGNOSIS — Z952 Presence of prosthetic heart valve: Secondary | ICD-10-CM | POA: Diagnosis not present

## 2023-02-16 DIAGNOSIS — I71012 Dissection of descending thoracic aorta: Secondary | ICD-10-CM | POA: Diagnosis not present

## 2023-02-16 DIAGNOSIS — I25118 Atherosclerotic heart disease of native coronary artery with other forms of angina pectoris: Secondary | ICD-10-CM | POA: Insufficient documentation

## 2023-02-16 NOTE — Patient Instructions (Addendum)
Medication Instructions:  Your physician recommends that you continue on your current medications as directed. Please refer to the Current Medication list given to you today.  *If you need a refill on your cardiac medications before your next appointment, please call your pharmacy*   Lab Work: TODAY: FASTING LIPIDS, CMET, CBC,TSH If you have labs (blood work) drawn today and your tests are completely normal, you will receive your results only by: MyChart Message (if you have MyChart) OR A paper copy in the mail If you have any lab test that is abnormal or we need to change your treatment, we will call you to review the results.   Testing/Procedures: EKG   Follow-Up: At [facility], you and your health needs are our priority.  As part of our continuing mission to provide you with exceptional heart care, we have created designated Provider Care Teams.  These Care Teams include your primary Cardiologist (physician) and Advanced Practice Providers (APPs -  Physician Assistants and Nurse Practitioners) who all work together to provide you with the care you need, when you need it.  We recommend signing up for the patient portal called "MyChart".  Sign up information is provided on this After Visit Summary.  MyChart is used to connect with patients for Virtual Visits (Telemedicine).  Patients are able to view lab/test results, encounter notes, upcoming appointments, etc.  Non-urgent messages can be sent to your provider as well.   To learn more about what you can do with MyChart, go to [email or web address].    Your next appointment:   6 month(s)  Provider:   DR. Gala Romney

## 2023-02-17 LAB — COMPREHENSIVE METABOLIC PANEL
ALT: 6 [IU]/L (ref 0–32)
AST: 15 [IU]/L (ref 0–40)
Albumin: 4.1 g/dL (ref 3.7–4.7)
Alkaline Phosphatase: 52 [IU]/L (ref 44–121)
BUN/Creatinine Ratio: 17 (ref 12–28)
BUN: 13 mg/dL (ref 8–27)
Bilirubin Total: 0.5 mg/dL (ref 0.0–1.2)
CO2: 21 mmol/L (ref 20–29)
Calcium: 9.1 mg/dL (ref 8.7–10.3)
Chloride: 108 mmol/L — ABNORMAL HIGH (ref 96–106)
Creatinine, Ser: 0.76 mg/dL (ref 0.57–1.00)
Globulin, Total: 2.9 g/dL (ref 1.5–4.5)
Glucose: 91 mg/dL (ref 70–99)
Potassium: 3.9 mmol/L (ref 3.5–5.2)
Sodium: 142 mmol/L (ref 134–144)
Total Protein: 7 g/dL (ref 6.0–8.5)
eGFR: 79 mL/min/{1.73_m2} (ref >59)

## 2023-02-17 LAB — LIPID PANEL
Chol/HDL Ratio: 2.9 ratio (ref 0.0–4.4)
Cholesterol, Total: 140 mg/dL (ref 100–199)
HDL: 49 mg/dL (ref >39)
LDL Chol Calc (NIH): 66 mg/dL (ref 0–99)
Triglycerides: 143 mg/dL (ref 0–149)
VLDL Cholesterol Cal: 25 mg/dL (ref 5–40)

## 2023-02-17 LAB — CBC
Hematocrit: 36.3 % (ref 34.0–46.6)
Hemoglobin: 11.7 g/dL (ref 11.1–15.9)
MCH: 33.7 pg — ABNORMAL HIGH (ref 26.6–33.0)
MCHC: 32.2 g/dL (ref 31.5–35.7)
MCV: 105 fL — ABNORMAL HIGH (ref 79–97)
Platelets: 183 10*3/uL (ref 150–450)
RBC: 3.47 x10E6/uL — ABNORMAL LOW (ref 3.77–5.28)
RDW: 12.6 % (ref 11.7–15.4)
WBC: 4.6 10*3/uL (ref 3.4–10.8)

## 2023-02-17 LAB — TSH: TSH: 1.95 u[IU]/mL (ref 0.450–4.500)

## 2023-04-30 DIAGNOSIS — E538 Deficiency of other specified B group vitamins: Secondary | ICD-10-CM | POA: Diagnosis not present

## 2023-04-30 DIAGNOSIS — D649 Anemia, unspecified: Secondary | ICD-10-CM | POA: Diagnosis not present

## 2023-04-30 DIAGNOSIS — M8580 Other specified disorders of bone density and structure, unspecified site: Secondary | ICD-10-CM | POA: Diagnosis not present

## 2023-04-30 DIAGNOSIS — E785 Hyperlipidemia, unspecified: Secondary | ICD-10-CM | POA: Diagnosis not present

## 2023-04-30 DIAGNOSIS — R7301 Impaired fasting glucose: Secondary | ICD-10-CM | POA: Diagnosis not present

## 2023-04-30 DIAGNOSIS — I10 Essential (primary) hypertension: Secondary | ICD-10-CM | POA: Diagnosis not present

## 2023-04-30 DIAGNOSIS — R7989 Other specified abnormal findings of blood chemistry: Secondary | ICD-10-CM | POA: Diagnosis not present

## 2023-05-03 ENCOUNTER — Encounter: Payer: Self-pay | Admitting: Pulmonary Disease

## 2023-05-03 ENCOUNTER — Telehealth: Payer: Self-pay | Admitting: Pharmacy Technician

## 2023-05-03 NOTE — Telephone Encounter (Signed)
Auth Submission: NO AUTH NEEDED Site of care: Site of care: [facility] Payer: MEDICAREA A/B & SUPP Medication & CPT/J Code(s) submitted: Prolia (Denosumab) [id number] Route of submission (phone, fax, portal):  Phone # Fax # Auth type: Buy/Bill PB Units/visits requested: X2 Reference number: 60MG  Q6 MONTHS Approval from: 05/03/23 to 05/13/24

## 2023-05-07 DIAGNOSIS — I110 Hypertensive heart disease with heart failure: Secondary | ICD-10-CM | POA: Diagnosis not present

## 2023-05-07 DIAGNOSIS — E785 Hyperlipidemia, unspecified: Secondary | ICD-10-CM | POA: Diagnosis not present

## 2023-05-07 DIAGNOSIS — I350 Nonrheumatic aortic (valve) stenosis: Secondary | ICD-10-CM | POA: Diagnosis not present

## 2023-05-07 DIAGNOSIS — I2510 Atherosclerotic heart disease of native coronary artery without angina pectoris: Secondary | ICD-10-CM | POA: Diagnosis not present

## 2023-05-07 DIAGNOSIS — C50512 Malignant neoplasm of lower-outer quadrant of left female breast: Secondary | ICD-10-CM | POA: Diagnosis not present

## 2023-05-07 DIAGNOSIS — Z0000 Encounter for general adult medical examination without abnormal findings: Secondary | ICD-10-CM | POA: Diagnosis not present

## 2023-05-07 DIAGNOSIS — Z170 Estrogen receptor positive status [ER+]: Secondary | ICD-10-CM | POA: Diagnosis not present

## 2023-05-07 DIAGNOSIS — M353 Polymyalgia rheumatica: Secondary | ICD-10-CM | POA: Diagnosis not present

## 2023-05-07 DIAGNOSIS — I5032 Chronic diastolic (congestive) heart failure: Secondary | ICD-10-CM | POA: Diagnosis not present

## 2023-05-07 DIAGNOSIS — E039 Hypothyroidism, unspecified: Secondary | ICD-10-CM | POA: Diagnosis not present

## 2023-05-07 DIAGNOSIS — K589 Irritable bowel syndrome without diarrhea: Secondary | ICD-10-CM | POA: Diagnosis not present

## 2023-05-07 DIAGNOSIS — I480 Paroxysmal atrial fibrillation: Secondary | ICD-10-CM | POA: Diagnosis not present

## 2023-05-07 DIAGNOSIS — R82998 Other abnormal findings in urine: Secondary | ICD-10-CM | POA: Diagnosis not present

## 2023-05-10 ENCOUNTER — Ambulatory Visit: Payer: PPO

## 2023-05-10 ENCOUNTER — Encounter: Payer: Self-pay | Admitting: Pulmonary Disease

## 2023-05-10 VITALS — BP 146/85 | HR 58 | Temp 97.3°F | Resp 16 | Ht 65.5 in | Wt 154.0 lb

## 2023-05-10 DIAGNOSIS — M810 Age-related osteoporosis without current pathological fracture: Secondary | ICD-10-CM | POA: Diagnosis not present

## 2023-05-10 MED ORDER — DENOSUMAB 60 MG/ML ~~LOC~~ SOSY
60.0000 mg | PREFILLED_SYRINGE | Freq: Once | SUBCUTANEOUS | Status: AC
  Administered 2023-05-10: 60 mg via SUBCUTANEOUS
  Filled 2023-05-10: qty 1

## 2023-05-10 NOTE — Progress Notes (Signed)
Diagnosis: Osteoporosis  Provider:  Chilton Greathouse MD  Procedure: Injection  Prolia (Denosumab), Dose: 60 mg, Site: subcutaneous, Number of injections: 1  Injection Site(s): Right upper quad. abdomen  Post Care: Patient declined observation  Discharge: Condition: Good, Destination: Home . AVS Provided  Performed by:  Loney Hering, LPN

## 2023-05-11 ENCOUNTER — Other Ambulatory Visit: Payer: Self-pay | Admitting: Hematology and Oncology

## 2023-05-12 DIAGNOSIS — Z6825 Body mass index (BMI) 25.0-25.9, adult: Secondary | ICD-10-CM | POA: Diagnosis not present

## 2023-05-12 DIAGNOSIS — M353 Polymyalgia rheumatica: Secondary | ICD-10-CM | POA: Diagnosis not present

## 2023-05-12 DIAGNOSIS — M1991 Primary osteoarthritis, unspecified site: Secondary | ICD-10-CM | POA: Diagnosis not present

## 2023-05-12 DIAGNOSIS — Z7952 Long term (current) use of systemic steroids: Secondary | ICD-10-CM | POA: Diagnosis not present

## 2023-05-12 DIAGNOSIS — M549 Dorsalgia, unspecified: Secondary | ICD-10-CM | POA: Diagnosis not present

## 2023-05-12 DIAGNOSIS — E663 Overweight: Secondary | ICD-10-CM | POA: Diagnosis not present

## 2023-05-12 DIAGNOSIS — M8580 Other specified disorders of bone density and structure, unspecified site: Secondary | ICD-10-CM | POA: Diagnosis not present

## 2023-05-12 DIAGNOSIS — M316 Other giant cell arteritis: Secondary | ICD-10-CM | POA: Diagnosis not present

## 2023-05-17 ENCOUNTER — Other Ambulatory Visit: Payer: Self-pay | Admitting: Internal Medicine

## 2023-05-20 DIAGNOSIS — H35372 Puckering of macula, left eye: Secondary | ICD-10-CM | POA: Diagnosis not present

## 2023-05-20 DIAGNOSIS — H532 Diplopia: Secondary | ICD-10-CM | POA: Diagnosis not present

## 2023-05-20 DIAGNOSIS — H53122 Transient visual loss, left eye: Secondary | ICD-10-CM | POA: Diagnosis not present

## 2023-05-20 DIAGNOSIS — M353 Polymyalgia rheumatica: Secondary | ICD-10-CM | POA: Diagnosis not present

## 2023-05-25 DIAGNOSIS — C50512 Malignant neoplasm of lower-outer quadrant of left female breast: Secondary | ICD-10-CM | POA: Diagnosis not present

## 2023-05-25 DIAGNOSIS — K589 Irritable bowel syndrome without diarrhea: Secondary | ICD-10-CM | POA: Diagnosis not present

## 2023-05-25 DIAGNOSIS — G453 Amaurosis fugax: Secondary | ICD-10-CM | POA: Diagnosis not present

## 2023-05-25 DIAGNOSIS — I2510 Atherosclerotic heart disease of native coronary artery without angina pectoris: Secondary | ICD-10-CM | POA: Diagnosis not present

## 2023-05-25 DIAGNOSIS — I480 Paroxysmal atrial fibrillation: Secondary | ICD-10-CM | POA: Diagnosis not present

## 2023-05-25 DIAGNOSIS — I5032 Chronic diastolic (congestive) heart failure: Secondary | ICD-10-CM | POA: Diagnosis not present

## 2023-05-25 DIAGNOSIS — E039 Hypothyroidism, unspecified: Secondary | ICD-10-CM | POA: Diagnosis not present

## 2023-05-25 DIAGNOSIS — I350 Nonrheumatic aortic (valve) stenosis: Secondary | ICD-10-CM | POA: Diagnosis not present

## 2023-05-25 DIAGNOSIS — M353 Polymyalgia rheumatica: Secondary | ICD-10-CM | POA: Diagnosis not present

## 2023-06-01 ENCOUNTER — Encounter: Payer: Self-pay | Admitting: Internal Medicine

## 2023-06-01 ENCOUNTER — Other Ambulatory Visit (HOSPITAL_COMMUNITY): Payer: Self-pay | Admitting: Family Medicine

## 2023-06-01 ENCOUNTER — Ambulatory Visit (HOSPITAL_COMMUNITY): Admission: RE | Admit: 2023-06-01 | Payer: PPO | Source: Ambulatory Visit

## 2023-06-01 DIAGNOSIS — G453 Amaurosis fugax: Secondary | ICD-10-CM

## 2023-06-04 ENCOUNTER — Ambulatory Visit (HOSPITAL_COMMUNITY)
Admission: RE | Admit: 2023-06-04 | Discharge: 2023-06-04 | Disposition: A | Discharge status: Home / Self Care | Payer: PPO | Source: Ambulatory Visit | Attending: Family Medicine | Admitting: Family Medicine

## 2023-06-04 DIAGNOSIS — G453 Amaurosis fugax: Secondary | ICD-10-CM | POA: Insufficient documentation

## 2023-06-09 ENCOUNTER — Other Ambulatory Visit (HOSPITAL_COMMUNITY): Payer: Self-pay | Admitting: Internal Medicine

## 2023-06-10 ENCOUNTER — Telehealth (HOSPITAL_COMMUNITY): Payer: Self-pay | Admitting: Internal Medicine

## 2023-06-10 NOTE — Telephone Encounter (Signed)
 Front office received a message from the nurse, Hessie Diener, that this patient needs a f/u appt in the clinic and this patient also needs refills.   Called patient at [phone] to schedule patient a f/u appt - - front office unable to reach patient.   Left patient a voice message to call front office back to make a f/u appt in the [facility].

## 2023-06-15 ENCOUNTER — Encounter (HOSPITAL_BASED_OUTPATIENT_CLINIC_OR_DEPARTMENT_OTHER): Payer: Self-pay | Admitting: Emergency Medicine

## 2023-06-15 ENCOUNTER — Emergency Department (HOSPITAL_BASED_OUTPATIENT_CLINIC_OR_DEPARTMENT_OTHER)
Admission: EM | Admit: 2023-06-15 | Discharge: 2023-06-15 | Disposition: A | Discharge status: Home / Self Care | Attending: Emergency Medicine | Admitting: Emergency Medicine

## 2023-06-15 ENCOUNTER — Other Ambulatory Visit: Payer: Self-pay

## 2023-06-15 ENCOUNTER — Emergency Department (HOSPITAL_BASED_OUTPATIENT_CLINIC_OR_DEPARTMENT_OTHER)

## 2023-06-15 DIAGNOSIS — Z7901 Long term (current) use of anticoagulants: Secondary | ICD-10-CM | POA: Insufficient documentation

## 2023-06-15 DIAGNOSIS — R739 Hyperglycemia, unspecified: Secondary | ICD-10-CM | POA: Diagnosis not present

## 2023-06-15 DIAGNOSIS — R000 Tachycardia, unspecified: Secondary | ICD-10-CM | POA: Insufficient documentation

## 2023-06-15 DIAGNOSIS — I4891 Unspecified atrial fibrillation: Secondary | ICD-10-CM | POA: Insufficient documentation

## 2023-06-15 DIAGNOSIS — R0602 Shortness of breath: Secondary | ICD-10-CM | POA: Insufficient documentation

## 2023-06-15 DIAGNOSIS — J9811 Atelectasis: Secondary | ICD-10-CM | POA: Diagnosis not present

## 2023-06-15 DIAGNOSIS — I700 Atherosclerosis of aorta: Secondary | ICD-10-CM | POA: Insufficient documentation

## 2023-06-15 DIAGNOSIS — J42 Unspecified chronic bronchitis: Secondary | ICD-10-CM | POA: Diagnosis not present

## 2023-06-15 DIAGNOSIS — Z79899 Other long term (current) drug therapy: Secondary | ICD-10-CM | POA: Insufficient documentation

## 2023-06-15 LAB — CBC
HCT: 37 % (ref 36.0–46.0)
Hemoglobin: 12.2 g/dL (ref 12.0–15.0)
MCH: 34 pg (ref 26.0–34.0)
MCHC: 33 g/dL (ref 30.0–36.0)
MCV: 103.1 fL — ABNORMAL HIGH (ref 80.0–100.0)
Platelets: 190 10*3/uL (ref 150–400)
RBC: 3.59 MIL/uL — ABNORMAL LOW (ref 3.87–5.11)
RDW: 13 % (ref 11.5–15.5)
WBC: 4.8 10*3/uL (ref 4.0–10.5)
nRBC: 0 % (ref 0.0–0.2)

## 2023-06-15 LAB — BASIC METABOLIC PANEL
Anion gap: 10 (ref 5–15)
BUN: 10 mg/dL (ref 8–23)
CO2: 23 mmol/L (ref 22–32)
Calcium: 9.3 mg/dL (ref 8.9–10.3)
Chloride: 107 mmol/L (ref 98–111)
Creatinine, Ser: 0.85 mg/dL (ref 0.44–1.00)
GFR, Estimated: >60 mL/min (ref >60)
Glucose, Bld: 134 mg/dL — ABNORMAL HIGH (ref 70–99)
Potassium: 3.7 mmol/L (ref 3.5–5.1)
Sodium: 140 mmol/L (ref 135–145)

## 2023-06-15 LAB — TROPONIN I (HIGH SENSITIVITY): Troponin I (High Sensitivity): 4 ng/L (ref <18)

## 2023-06-15 LAB — PROTIME-INR
INR: 1.4 — ABNORMAL HIGH (ref 0.8–1.2)
Prothrombin Time: 17 s — ABNORMAL HIGH (ref 11.4–15.2)

## 2023-06-15 LAB — BRAIN NATRIURETIC PEPTIDE: B Natriuretic Peptide: 410.8 pg/mL — ABNORMAL HIGH (ref 0.0–100.0)

## 2023-06-15 NOTE — ED Provider Notes (Signed)
 [place] EMERGENCY DEPARTMENT AT [facility] Provider Note   CSN: [id number] Arrival date & time: 06/15/23  1327     History  Chief Complaint  Patient presents with   Tachycardia    afibRVR    Marissa Powers is a 83 y.o. female.  HPI 83 year old female with known history of A-fib, on Eliquis, metoprolol, Entresto, presents today with report of new onset of her A-fib.  She states that she has recently been on a long trip.  There were some flight problems at the end of the trip and she had missed several doses of her medication.  She thinks the only dose of her Toprol and Eliquis that she missed was Saturday night and she took that when she got home on Sunday.  She felt well this morning until 8:00 when she started feeling like she is having some palpitations, some increased weakness and lightheadedness.  She is not having any chest pain or dyspnea.  She comes in secondary to that and is in A-fib with RVR.  She reports that she took a second dose of her metoprolol as per her cardiology instructions at around 11 AM.     Home Medications Prior to Admission medications   Medication Sig Start Date End Date Taking? Authorizing Provider  acetaminophen (TYLENOL) 500 MG tablet Take 1,000 mg by mouth every 6 (six) hours as needed for moderate pain.   Yes [provider]  apixaban (ELIQUIS) 5 MG TABS tablet TAKE 1 TABLET(5 MG) BY MOUTH TWICE DAILY 06/10/23  Yes Bensimhon, Bevelyn Buckles, MD  b complex vitamins capsule Take 1 capsule by mouth daily.   Yes [provider]  Calcium Carb-Cholecalciferol (CALCIUM + D3 PO) Take 1 tablet by mouth in the morning.   Yes [provider]  cholecalciferol (VITAMIN D3) 25 MCG (1000 UNIT) tablet Take 1,000 Units by mouth in the morning.   Yes [provider]  dapagliflozin propanediol (FARXIGA) 10 MG TABS tablet Take 1 tablet (10 mg total) by mouth daily before breakfast. 01/27/23  Yes Janetta Hora, PA-C  denosumab  (PROLIA) 60 MG/ML SOSY injection Inject 60 mg into the skin every 6 (six) months.   Yes [provider]  folic acid (FOLVITE) 1 MG tablet Take 2 mg by mouth in the morning.   Yes [provider]  letrozole (FEMARA) 2.5 MG tablet TAKE 1 TABLET(2.5 MG) BY MOUTH DAILY 05/11/23  Yes Serena Croissant, MD  levothyroxine (SYNTHROID) 100 MCG tablet Take 100 mcg by mouth daily before breakfast.   Yes [provider]  loperamide (IMODIUM) 2 MG capsule Take 2-4 mg by mouth 4 (four) times daily as needed (upset stomach/diarrhea/loose stools).   Yes [provider]  metoprolol succinate (TOPROL-XL) 25 MG 24 hr tablet TAKE 1 TABLET(25 MG) BY MOUTH DAILY 05/18/23  Yes Bensimhon, Bevelyn Buckles, MD  nitroGLYCERIN (NITROSTAT) 0.4 MG SL tablet Place 1 tablet (0.4 mg total) under the tongue every 5 (five) minutes as needed for chest pain. 07/05/20  Yes Lyn Records, MD  ondansetron (ZOFRAN-ODT) 4 MG disintegrating tablet Take 4 mg by mouth every 8 (eight) hours as needed. 06/03/23  Yes [provider]  REPATHA SURECLICK 140 MG/ML SOAJ Inject 140 mg into the skin every 14 (fourteen) days. 08/06/21  Yes [provider]  sacubitril-valsartan (ENTRESTO) 49-51 MG TAKE 1 TABLET BY MOUTH TWICE DAILY 09/02/22  Yes Bensimhon, Bevelyn Buckles, MD  zolpidem (AMBIEN) 5 MG tablet Take 2.5 mg by mouth at bedtime.  Yes [provider]      Allergies    Cashew nut oil, Cashew nut (anacardium occidentale) skin test, and Other    Review of Systems   Review of Systems  Physical Exam Updated Vital Signs BP (!) 136/53 (BP Location: Right Arm)   Pulse (!) 126   Temp 97.6 F (36.4 C) (Oral)   Resp 20   SpO2 97%  Physical Exam Vitals and nursing note reviewed.  HENT:     Head: Normocephalic.     Right Ear: External ear normal.     Left Ear: External ear normal.     Mouth/Throat:     Pharynx: Oropharynx is clear.  Eyes:     Pupils: Pupils are equal, round, and reactive to light.   Cardiovascular:     Rate and Rhythm: Tachycardia present. Rhythm irregular.     Pulses: Normal pulses.  Pulmonary:     Effort: Pulmonary effort is normal.     Breath sounds: Normal breath sounds.  Abdominal:     General: Abdomen is flat. Bowel sounds are normal.  Musculoskeletal:        General: Normal range of motion.  Skin:    General: Skin is warm.     Capillary Refill: Capillary refill takes less than 2 seconds.  Neurological:     General: No focal deficit present.     Mental Status: She is alert.  Psychiatric:        Mood and Affect: Mood normal.     ED Results / Procedures / Treatments   Labs (all labs ordered are listed, but only abnormal results are displayed) Labs Reviewed  CBC - Abnormal; Notable for the following components:      Result Value   RBC 3.59 (*)    MCV 103.1 (*)    All other components within normal limits  BASIC METABOLIC PANEL - Abnormal; Notable for the following components:   Glucose, Bld 134 (*)    All other components within normal limits  BRAIN NATRIURETIC PEPTIDE - Abnormal; Notable for the following components:   B Natriuretic Peptide 410.8 (*)    All other components within normal limits  PROTIME-INR - Abnormal; Notable for the following components:   Prothrombin Time 17.0 (*)    INR 1.4 (*)    All other components within normal limits  TROPONIN I (HIGH SENSITIVITY)  TROPONIN I (HIGH SENSITIVITY)    EKG EKG Interpretation Date/Time:  Tuesday June 15 2023 13:34:08 EST Ventricular Rate:  121 PR Interval:    QRS Duration:  80 QT Interval:  338 QTC Calculation: 479 R Axis:   -28  Text Interpretation: Atrial fibrillation with rapid ventricular response Low voltage QRS Cannot rule out Anteroseptal infarct (cited on or before 06-May-2022) Abnormal ECG When compared with ECG of 16-Feb-2023 11:40, Atrial fibrillation has replaced Sinus rhythm Vent. rate has increased BY  70 BPM Questionable change in initial forces of Anteroseptal leads  ST now depressed in Lateral leads T wave inversion now evident in Lateral leads Confirmed by Margarita Grizzle [phone]) on 06/15/2023 2:16:55 PM  Radiology No results found.  Procedures .Critical Care  Performed by: Margarita Grizzle, MD Authorized by: Margarita Grizzle, MD   Critical care provider statement:    Critical care time (minutes):  30   Critical care was time spent personally by me on the following activities:  Development of treatment plan with patient or surrogate, discussions with consultants, evaluation of patient's response to treatment, examination of patient, ordering and review  of laboratory studies, ordering and review of radiographic studies, ordering and performing treatments and interventions, pulse oximetry, re-evaluation of patient's condition and review of old charts     Medications Ordered in ED Medications - No data to display  ED Course/ Medical Decision Making/ A&P Clinical Course as of 06/15/23 1508  Tue Jun 15, 2023  1447 BNP is elevated at 410 CBC is within normal limits Basic metabolic panel significant only for mildly elevated glucose [DR]    Clinical Course User Index [DR] Margarita Grizzle, MD                                 Medical Decision Making Amount and/or Complexity of Data Reviewed Labs: ordered. Radiology: ordered.   82 year old female history of A-fib RVR, on anticoagulation who presents today with onset of A-fib.  She had also uses beta-blockers.  She took an extra dose of her metoprolol XL 25 mg today at 11 AM.  She has been on a trip and has missed a dose of her medications on Saturday.  This included her anticoagulation.  She has felt slightly dizzy and lightheaded but has not had any syncope.  Here her heart rate has been steady around 120.  Blood pressure has been adequate at 136/68.  She is awake and alert and is not having any significant symptoms at this time. I have discussed risks and benefits with the patient.  These include the fact that  she has missed a dose of her anticoagulation.  I would be concerned about doing a cardioversion here in the ED with her recently missed dose.  She appears to be hemodynamically stable and I am observing her here post her beta-blocker dose at home.  Risks and benefits discussed with patient.  I am attempting to contact her son who is a physician.  She is anxious to be discharged as she has tickets to a play tonight and wishes to go out to dinner. Discussed with Dr. Charlann Boxer.  He is in agreement with plan.  Patient appears stable for discharge.  She understands return precautions need for follow-up and voices understanding.         Final Clinical Impression(s) / ED Diagnoses Final diagnoses:  Atrial fibrillation with RVR [facility])    Rx / DC Orders ED Discharge Orders     None         Margarita Grizzle, MD 06/15/23 [phone]

## 2023-06-15 NOTE — ED Triage Notes (Signed)
 AFIB. No CP. SOB, fatigue. Extra metoprolol @ 1100. On Eliquis.

## 2023-06-15 NOTE — Discharge Instructions (Addendum)
 Please take your usual doses of medication tonight Return if you are having new or worsening symptoms Drink plenty of fluids You can go to your show went out to dinner but please make sure that somebody is with you and sit down immediately if you start to feel lightheaded all Please follow-up with your cardiologist this week

## 2023-06-15 NOTE — ED Notes (Signed)
 Portable XR at bedside

## 2023-06-15 NOTE — ED Provider Triage Note (Signed)
 Emergency Medicine Provider Triage Evaluation Note  Marissa Powers , a 83 y.o. female  was evaluated in triage.  Pt complains of tachycardia and dizziness, sob. Reports began this morning. States history of afib, is anticoagulated. Denies CP but is endorsing SOB. Denies LH, dizziness, weakness. Sees CHF clinic with Dr. Elray Mcgregor. Denies leg swelling.  Review of Systems  Positive:  Negative:   Physical Exam  There were no vitals taken for this visit. Gen:   Awake, no distress   Resp:  Normal effort  MSK:   Moves extremities without difficulty  Other:    Medical Decision Making  Medically screening exam initiated at 1:33 PM.  Appropriate orders placed.  MERSADES BARBARO was informed that the remainder of the evaluation will be completed by another provider, this initial triage assessment does not replace that evaluation, and the importance of remaining in the ED until their evaluation is complete.     Al Decant, PA-C 06/15/23 1333

## 2023-06-16 ENCOUNTER — Telehealth (HOSPITAL_COMMUNITY): Payer: Self-pay | Admitting: Cardiology

## 2023-06-16 NOTE — Telephone Encounter (Signed)
 Pt called as instructed by ER provider  Seen in ER 3/4 for Afib, currently back in NSR   No follow up scheduled   Message as FYI to provider

## 2023-06-17 ENCOUNTER — Telehealth: Payer: Self-pay

## 2023-06-17 NOTE — Telephone Encounter (Signed)
 Er fu scheduled

## 2023-06-17 NOTE — Progress Notes (Addendum)
 ADVANCED HF CLINIC NOTE  Primary Care: Rodrigo Ran, MD HF Cardiologist: Dr. Gala Romney   HPI:  Marissa Powers is a 83 y.o. 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   Echo 6/23 EF 55-60% G2 DD, RV ok. TAVR OK.   Pre TAVR cath 3/23  LM, LAD and RCA ok . D1 70-80% RHC  RA 5 PA 38/10 (24) PW 12 Fick 6.6/3.4 LVEDP 21  Diagnosed with Ib breast CA s/p lumpectomy being treated with XRT 02/06/2022-03/26/2022 and letrozole  CPX 05/24: Excellent functional capacity compared to age matched sujects, markedly elevated VE/VCO2 which can indicate elevated pulmonary pressures during exercise. However, given low PETCO2, the measured VE/VCO2 may be confounded by end exercise hyperventilation.  Echo 05/24: EF 60-65%, RV okay, normal functioning aortic valve prosthesis  Seen in ED 06/15/23 with recurrent Afib with RVR. Reported that she may have missed a couple of doses of medications due to flight issues at the end of a trip a couple of days prior. She also had been recovering from recent norovirus. Felt to be stable for discharge home with close outpatient follow-up.  Here today for hospital follow-up for atrial fibrillation. Reports she went back into sinus rhythm not longer after discharge from ED. Has been maintaining SR since. She's been feeling more fatigue following norovirus but is slowly regaining strength. Walks her dog for 10 minutes 4 times a day or 30 minutes once. No dyspnea or chest pain with exertion. Typically takes her all of her medications as prescribed. Minimal ETOH intake.    Past Medical History:  Diagnosis Date   Arthritis    osteoarthritis   Breast cancer (HCC) 11/2021   CAD (coronary artery disease)    Descending  thoracic aortic dissection (HCC)    followed by Dr. 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 Clifton James & Dr. Laneta Simmers .   Severe aortic stenosis     Current Outpatient Medications  Medication Sig Dispense Refill   acetaminophen (TYLENOL) 500 MG tablet Take 1,000 mg by mouth every 6 (six) hours as needed for moderate pain.     apixaban (ELIQUIS) 5 MG TABS tablet TAKE 1 TABLET(5 MG) BY MOUTH TWICE DAILY 60 tablet 2   b complex vitamins capsule Take 1 capsule by mouth daily.     Calcium Carb-Cholecalciferol (CALCIUM + D3 PO) Take 1 tablet by mouth in the morning.     cholecalciferol (VITAMIN D3) 25 MCG (1000 UNIT) tablet Take 1,000 Units by mouth in the morning.     dapagliflozin propanediol (FARXIGA) 10 MG TABS tablet Take 1 tablet (10 mg total) by mouth daily before breakfast. 30 tablet 7   denosumab (PROLIA) 60 MG/ML SOSY injection Inject  60 mg into the skin every 6 (six) months.     folic acid (FOLVITE) 1 MG tablet Take 2 mg by mouth in the morning.     letrozole (FEMARA) 2.5 MG tablet TAKE 1 TABLET(2.5 MG) BY MOUTH DAILY 90 tablet 3   levothyroxine (SYNTHROID) 100 MCG tablet Take 100 mcg by mouth daily before breakfast.     loperamide (IMODIUM) 2 MG capsule Take 2-4 mg by mouth 4 (four) times daily as needed (upset stomach/diarrhea/loose stools).     metoprolol succinate (TOPROL-XL) 25 MG 24 hr tablet TAKE 1 TABLET(25 MG) BY MOUTH DAILY 90 tablet 3   nitroGLYCERIN (NITROSTAT) 0.4 MG SL tablet Place 1 tablet (0.4  mg total) under the tongue every 5 (five) minutes as needed for chest pain. 25 tablet 3   ondansetron (ZOFRAN-ODT) 4 MG disintegrating tablet Take 4 mg by mouth every 8 (eight) hours as needed.     REPATHA SURECLICK 140 MG/ML SOAJ Inject 140 mg into the skin every 14 (fourteen) days.     sacubitril-valsartan (ENTRESTO) 49-51 MG TAKE 1 TABLET BY MOUTH TWICE DAILY 60 tablet 11   zolpidem (AMBIEN) 5 MG tablet Take 2.5 mg by mouth at bedtime.     No current facility-administered medications for this encounter.    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 Hives   Other Rash    Other reaction(s): Facial Swelling      Social History   Socioeconomic History   Marital status: Widowed    Spouse name: Not on file   Number of children: 2   Years of education: Not on file   Highest education level: Not on file  Occupational History   Not on file  Tobacco Use   Smoking status: Never    Passive exposure: Never   Smokeless tobacco: Never  Vaping Use   Vaping status: Never Used  Substance and Sexual Activity   Alcohol use: Yes    Alcohol/week: 4.0 - 6.0 standard drinks of alcohol    Types: 4 - 6 Standard drinks or equivalent per week   Drug use: No   Sexual activity: Not Currently    Partners: Male    Birth control/protection: Post-menopausal  Other Topics Concern   Not on file  Social History Narrative   Not on file   Social Drivers of Health   Financial Resource Strain: Low Risk  (10/18/2021)   Received from [facility] [place], [facility] [place]   Overall Physicist, medical Strain (CARDIA)    Difficulty of Paying Living Expenses: Not hard at all  Food Insecurity: No Food Insecurity (10/18/2021)   Received from [facility], [facility] [place]   Hunger Vital Sign    Worried About Running Out of Food in the  Last Year: Never true    Ran Out of Food in the Last Year: Never true  Transportation Needs: No Transportation Needs (10/18/2021)   Received from [facility], [facility] [place]   [employer] - Administrator, Civil Service (Medical): No    Lack of Transportation (Non-Medical): No  Physical Activity: Not on file  Stress: Not on file  Social Connections: Unknown (06/10/2022)   Received from [facility], [facility]   Social Network    Social Network: Not on file  Intimate  Partner Violence: Unknown (06/10/2022)   Received from [facility], [facility]   HITS    Physically Hurt: Not on file    Insult or Talk Down To: Not on file    Threaten Physical Harm: Not on file    Scream or Curse: Not on file      Family History  Problem Relation Age of Onset   Aortic aneurysm Mother    Clotting disorder Mother    Healthy Father    Heart disease Brother        stints   Breast cancer Maternal Aunt 62   Breast cancer Maternal Grandmother 58   Heart attack Maternal Grandmother     Vitals:   06/18/23 0827 06/18/23 0829 06/18/23 0907  BP:  (!) 148/80 128/88  Pulse:  64   SpO2:  97%   Weight: 68.5 kg (151 lb) 68.5 kg (151 lb)   Height: 5\' 5"  (1.651 m) 5\' 5"  (1.651 m)     Wt Readings from Last 3 Encounters:  06/18/23 68.5 kg (151 lb)  05/10/23 69.9 kg (154 lb)  02/16/23 70.8 kg (156 lb)    PHYSICAL EXAM: General: Appears younger than stated age. Neck: no JVD.  Cor: Regular rate & rhythm. No rubs, gallops or murmurs. Lungs: clear Abdomen: soft, nontender, nondistended.  Extremities: no edema Neuro: alert & orientedx3. Affect pleasant  ECG: NSR 63 bpm  ASSESSMENT & PLAN: 1. PAF - Recurrence 06/15/23 after missing a couple doses of medications and recent norovirus infection. - Converted to SR at home same day as ED visit. Has not felt recurrence. NSR on ECG today. - Continue metoprolol xl 25 mg daily. Can take an extra tablet as needed for  breakthrough episodes. Discussed when to seek ED evaluation. - Previously on amiodarone. Could consider restarting in the future if recurrence. - TSH WNL in 11/24 - Continue eliquis  2. Chronic diastolic HF and dyspnea -CPX 05/24: Excellent functional capacity compared to age matched sujects, markedly elevated VE/VCO2 which can indicate elevated pulmonary pressures during exercise. However, given low PETCO2, the measured VE/VCO2 may be confounded by end exercise hyperventilation. - Echo 05/24: EF 60-65%, RV okay, normal functioning aortic valve prosthesis - NYHA II recently. Volume looks good. Does not need loop diuretic. - Encouraged her to continue with regular exercise - Continue Entresto 49/51 mg BID - On Farxiga 10  - BMET from 03/04 with stable renal function..   3. Aortic stenosis s/p TAVR 5/23 - Valve okay on echo 05/24 - stable - Aware of need for SBE prophylaxis  4. CAD, non obstructive - cath 9/23 D1 70-80%  - No symptoms concerning for angina - Not on aspirin with anticoagulation. No statin, remains on repatha - Goal LDL < 70, LDL 66 in 11/24  5. Descending thoracic aortic dissection  - stable by CT 7/23 - followed by Dr. Myra Gianotti, overdue for follow-up  6. HTN - Blood pressure initially elevated today.  - Improved on recheck. Recommended home monitoring. - Continue above medications  Referral placed to [facility] for follow-up.   Follow-up: HF clinic as needed, requesting referral to Dr. Jodelle Red for ongoing Cardiology follow-up (knows someone who sees this provider).   Shaiden Aldous N, PA-C  11:55 AM

## 2023-06-17 NOTE — Telephone Encounter (Signed)
 Called the patient to offer an appointment on 07/05/23 if she can arrive at 9:40 am. No answer. Left her a message asking she call us back.

## 2023-06-18 ENCOUNTER — Encounter (HOSPITAL_COMMUNITY): Payer: Self-pay

## 2023-06-18 ENCOUNTER — Ambulatory Visit (HOSPITAL_COMMUNITY)
Admission: RE | Admit: 2023-06-18 | Discharge: 2023-06-18 | Disposition: A | Discharge status: Home / Self Care | Source: Ambulatory Visit | Attending: Physician Assistant | Admitting: Physician Assistant

## 2023-06-18 VITALS — BP 128/88 | HR 64 | Ht 65.0 in | Wt 151.0 lb

## 2023-06-18 DIAGNOSIS — I71012 Dissection of descending thoracic aorta: Secondary | ICD-10-CM | POA: Insufficient documentation

## 2023-06-18 DIAGNOSIS — M353 Polymyalgia rheumatica: Secondary | ICD-10-CM | POA: Insufficient documentation

## 2023-06-18 DIAGNOSIS — I2510 Atherosclerotic heart disease of native coronary artery without angina pectoris: Secondary | ICD-10-CM | POA: Diagnosis not present

## 2023-06-18 DIAGNOSIS — Z952 Presence of prosthetic heart valve: Secondary | ICD-10-CM | POA: Diagnosis not present

## 2023-06-18 DIAGNOSIS — Z7901 Long term (current) use of anticoagulants: Secondary | ICD-10-CM | POA: Diagnosis not present

## 2023-06-18 DIAGNOSIS — I5032 Chronic diastolic (congestive) heart failure: Secondary | ICD-10-CM | POA: Insufficient documentation

## 2023-06-18 DIAGNOSIS — I480 Paroxysmal atrial fibrillation: Secondary | ICD-10-CM | POA: Insufficient documentation

## 2023-06-18 DIAGNOSIS — Z79899 Other long term (current) drug therapy: Secondary | ICD-10-CM | POA: Diagnosis not present

## 2023-06-18 DIAGNOSIS — E039 Hypothyroidism, unspecified: Secondary | ICD-10-CM | POA: Diagnosis not present

## 2023-06-18 DIAGNOSIS — I110 Hypertensive heart disease with heart failure: Secondary | ICD-10-CM | POA: Diagnosis not present

## 2023-06-18 DIAGNOSIS — I10 Essential (primary) hypertension: Secondary | ICD-10-CM

## 2023-06-18 NOTE — Patient Instructions (Addendum)
 Thank you for coming in today  If you had labs drawn today, any labs that are abnormal the clinic will call you No news is good news  Medications: No changes  Follow up appointments:  Your physician recommends that you schedule a follow-up appointment in:  As needed   Do the following things EVERYDAY: Weigh yourself in the morning before breakfast. Write it down and keep it in a log. Take your medicines as prescribed Eat low salt foods--Limit salt (sodium) to 2000 mg per day.  Stay as active as you can everyday Limit all fluids for the day to less than 2 liters   At the [facility], you and your health needs are our priority. As part of our continuing mission to provide you with exceptional heart care, we have created designated Provider Care Teams. These Care Teams include your primary Cardiologist (physician) and Advanced Practice Providers (APPs- Physician Assistants and Nurse Practitioners) who all work together to provide you with the care you need, when you need it.   You may see any of the following providers on your designated Care Team at your next follow up: Dr Arvilla Meres Dr Marca Ancona Dr. Marcos Eke, NP Robbie Lis, [place] [facility] [place], [place] Brynda Peon, NP Karle Plumber, PharmD   Please be sure to bring in all your medications bottles to every appointment.    Thank you for choosing [facility]  If you have any questions or concerns before your next appointment please send [place] a message through [place] or call our office at [phone].    TO LEAVE A MESSAGE FOR THE NURSE SELECT OPTION 2, PLEASE LEAVE A MESSAGE INCLUDING: YOUR NAME DATE OF BIRTH CALL BACK NUMBER REASON FOR CALL**this is important as we prioritize the call backs  YOU WILL RECEIVE A CALL BACK THE SAME DAY AS LONG AS YOU CALL BEFORE 4:00 PM

## 2023-06-22 NOTE — Telephone Encounter (Signed)
 Patient is returning your call.

## 2023-06-22 NOTE — Telephone Encounter (Signed)
 Patient accepts the appointment as offered.

## 2023-06-28 DIAGNOSIS — R928 Other abnormal and inconclusive findings on diagnostic imaging of breast: Secondary | ICD-10-CM | POA: Diagnosis not present

## 2023-06-28 DIAGNOSIS — R92333 Mammographic heterogeneous density, bilateral breasts: Secondary | ICD-10-CM | POA: Diagnosis not present

## 2023-06-28 DIAGNOSIS — N641 Fat necrosis of breast: Secondary | ICD-10-CM | POA: Diagnosis not present

## 2023-07-01 ENCOUNTER — Encounter: Payer: Self-pay | Admitting: Adult Health

## 2023-07-05 ENCOUNTER — Other Ambulatory Visit (INDEPENDENT_AMBULATORY_CARE_PROVIDER_SITE_OTHER)

## 2023-07-05 ENCOUNTER — Encounter: Payer: Self-pay | Admitting: Pulmonary Disease

## 2023-07-05 ENCOUNTER — Encounter: Payer: Self-pay | Admitting: Internal Medicine

## 2023-07-05 ENCOUNTER — Telehealth: Payer: Self-pay | Admitting: Pharmacy Technician

## 2023-07-05 ENCOUNTER — Ambulatory Visit: Admitting: Internal Medicine

## 2023-07-05 VITALS — BP 138/70 | HR 64 | Ht 64.0 in | Wt 153.0 lb

## 2023-07-05 DIAGNOSIS — R197 Diarrhea, unspecified: Secondary | ICD-10-CM

## 2023-07-05 DIAGNOSIS — R1030 Lower abdominal pain, unspecified: Secondary | ICD-10-CM | POA: Diagnosis not present

## 2023-07-05 DIAGNOSIS — K5900 Constipation, unspecified: Secondary | ICD-10-CM

## 2023-07-05 DIAGNOSIS — K219 Gastro-esophageal reflux disease without esophagitis: Secondary | ICD-10-CM

## 2023-07-05 DIAGNOSIS — R110 Nausea: Secondary | ICD-10-CM | POA: Diagnosis not present

## 2023-07-05 LAB — TSH: TSH: 0.28 u[IU]/mL — ABNORMAL LOW (ref 0.35–5.50)

## 2023-07-05 LAB — C-REACTIVE PROTEIN: CRP: 1 mg/dL (ref 0.5–20.0)

## 2023-07-05 NOTE — Telephone Encounter (Signed)
 Auth Submission: NO AUTH NEEDED Site of care: Site of care: [facility] Payer: [employer] ADVT Medication & CPT/J Code(s) submitted: Prolia (Denosumab) [id number] Route of submission (phone, fax, portal):  Phone # Fax # Auth type: Buy/Bill PB Units/visits requested: 60MG  Q6 MONTHS Reference number:  Approval from: 07/05/23 to 04/12/24

## 2023-07-05 NOTE — Patient Instructions (Addendum)
 Your provider has requested that you go to the basement level for lab work before leaving today. Press "B" on the elevator. The lab is located at the first door on the left as you exit the elevator.  A referral was placed for pelvic floor therapy they will contact you to schedule a appointment.  If your blood pressure at your visit was 140/90 or greater, please contact your primary care physician to follow up on this.  _______________________________________________________  If you are age 82 or older, your body mass index should be between 23-30. Your Body mass index is 26.26 kg/m. If this is out of the aforementioned range listed, please consider follow up with your Primary Care Provider.  If you are age 28 or younger, your body mass index should be between 19-25. Your Body mass index is 26.26 kg/m. If this is out of the aformentioned range listed, please consider follow up with your Primary Care Provider.   ________________________________________________________  The [place] [facility] providers would like to encourage you to use [facility] to communicate with providers for non-urgent requests or questions.  Due to long hold times on the telephone, sending your provider a message by [facility] may be a faster and more efficient way to get a response.  Please allow 48 business hours for a response.  Please remember that this is for non-urgent requests.  _______________________________________________________  Thank you for entrusting me with your care and for choosing [facility],  Dr. Eulah Pont

## 2023-07-05 NOTE — Progress Notes (Addendum)
 Chief Complaint: Diarrhea, ab pain, and nausea  HPI : 83 year old female with history of A-fib on Eliquis, prior diverticulitis, CAD, GERD, PMR, aortic stenosis s/p TAVR, breast cancer in remission, arthritis presents with diarrhea, ab pain, and nausea  She had norovirus 5 weeks ago with significant vomiting. Even after she was able to recover from the infection, she has still had constant nausea. She would typically throw up every once in a while even before her norovirus infection. She goes from constipation to diarrhea. She has mushy stools that she has to strain to pass. Sometimes she has to sit 40 min on the toilet in order to allow a BM to occur. She currently uses pantoprazole, Align probiotic, Zofran, Metamucil, and Tums. She uses dicyclomine as needed for stomach cramps. She has intermittent lower ab pain cramping. She has tried different diets to help with her symptoms. Denies bloating. Denies blood in the stools. Denies dysphagia or acid reflux. Weight has been okay. Last colonoscopy was in her 14s and it was normal.   Wt Readings from Last 3 Encounters:  07/05/23 153 lb (69.4 kg)  06/18/23 151 lb (68.5 kg)  05/10/23 154 lb (69.9 kg)    Past Medical History:  Diagnosis Date   Arthritis    osteoarthritis   Breast cancer (HCC) 11/2021   CAD (coronary artery disease)    Descending thoracic aortic dissection (HCC)    followed by Dr. 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 Clifton James & Dr. Laneta Simmers .    Severe aortic stenosis      Past Surgical History:  Procedure Laterality Date   AXILLARY SENTINEL NODE BIOPSY Left 12/29/2021   Procedure: LEFT AXILLARY SENTINEL NODE BIOPSY;  Surgeon: Emelia Loron, MD;  Location: [facility] OR;  Service: General;  Laterality: Left;   BREAST BIOPSY Bilateral 1970's   x2 one a side   BREAST LUMPECTOMY WITH RADIOACTIVE SEED LOCALIZATION Left 12/29/2021   Procedure: BREAST LUMPECTOMY WITH RADIOACTIVE SEED LOCALIZATION X2;  Surgeon: Emelia Loron, MD;  Location: [facility] OR;  Service: General;  Laterality: Left;   CATARACT EXTRACTION     bilateral   implantable loop recorder removal  05/30/2021   MDT reveal LINQ removed by Dr Johney Frame   INTRAOPERATIVE TRANSTHORACIC ECHOCARDIOGRAM N/A 08/19/2021   Procedure: INTRAOPERATIVE TRANSTHORACIC ECHOCARDIOGRAM;  Surgeon: Kathleene Hazel, MD;  Location: [facility] OR;  Service: Open Heart Surgery;  Laterality: N/A;   LOOP RECORDER INSERTION N/A 08/11/2017   Procedure: LOOP RECORDER INSERTION;  Surgeon: Hillis Range, MD;  Location: [facility] INVASIVE CV LAB;  Service: Cardiovascular;  Laterality: N/A;   RADIOACTIVE SEED GUIDED AXILLARY SENTINEL LYMPH NODE Left 12/29/2021   Procedure: LEFT AXILLARY NODE SEED GUIDED EXCISION;  Surgeon: Emelia Loron, MD;  Location: [facility] OR;  Service: General;  Laterality: Left;   RETINAL DETACHMENT SURGERY  2000   RIGHT/LEFT HEART CATH AND CORONARY ANGIOGRAPHY N/A 06/19/2021  Procedure: RIGHT/LEFT HEART CATH AND CORONARY ANGIOGRAPHY;  Surgeon: Lyn Records, MD;  Location: [facility] INVASIVE CV LAB;  Service: Cardiovascular;  Laterality: N/A;   THYROIDECTOMY  1967   TONSILLECTOMY     TOTAL KNEE ARTHROPLASTY  04/25/2012   Procedure: TOTAL KNEE BILATERAL;  Surgeon: Loanne Drilling, MD;  Location: [facility] ORS;  Service: Orthopedics;  Laterality: Bilateral;   TRANSCATHETER AORTIC VALVE REPLACEMENT, TRANSFEMORAL N/A 08/19/2021   Procedure: Transcatheter Aortic Valve Replacement, Transfemoral Using 23mm Sapien 3 Ultra  Edwards Valve;  Surgeon: Kathleene Hazel, MD;  Location: [facility] OR;  Service: Open Heart Surgery;  Laterality: N/A;   Family History  Problem Relation Age of Onset   AAA (abdominal aortic aneurysm) Mother 40   Healthy Father    Heart disease Brother        stints   Breast cancer Maternal Grandmother 49   Heart attack Maternal Grandmother    Breast cancer Maternal Aunt 71   Hypertension Son    Hypertension Son    Social History   Tobacco Use   Smoking status: Never    Passive exposure: Never   Smokeless tobacco: Never  Vaping Use   Vaping status: Never Used  Substance Use Topics   Alcohol use: Yes    Alcohol/week: 4.0 - 6.0 standard drinks of alcohol    Types: 4 - 6 Standard drinks or equivalent per week    Comment: once a week   Drug use: No   Current Outpatient Medications  Medication Sig Dispense Refill   acetaminophen (TYLENOL) 500 MG tablet Take 1,000 mg by mouth every 6 (six) hours as needed for moderate pain.     apixaban (ELIQUIS) 5 MG TABS tablet TAKE 1 TABLET(5 MG) BY MOUTH TWICE DAILY 60 tablet 2   b complex vitamins capsule Take 1 capsule by mouth daily.     Calcium Carb-Cholecalciferol (CALCIUM + D3 PO) Take 1 tablet by mouth in the morning.     cholecalciferol (VITAMIN D3) 25 MCG (1000 UNIT) tablet Take 1,000 Units by mouth in the morning.     dapagliflozin propanediol (FARXIGA) 10 MG TABS tablet Take 1 tablet (10 mg total) by mouth daily before breakfast. 30 tablet 7   denosumab (PROLIA) 60 MG/ML SOSY injection Inject 60 mg into the skin every 6 (six) months.     folic acid (FOLVITE) 1 MG tablet Take 2 mg by mouth in the morning.     letrozole (FEMARA) 2.5 MG tablet TAKE 1 TABLET(2.5 MG) BY MOUTH DAILY 90 tablet 3   levothyroxine (SYNTHROID) 100 MCG tablet Take 100 mcg by mouth daily before breakfast.     loperamide (IMODIUM) 2 MG capsule Take 2-4 mg by mouth 4 (four) times daily as needed (upset stomach/diarrhea/loose stools).     metoprolol succinate  (TOPROL-XL) 25 MG 24 hr tablet TAKE 1 TABLET(25 MG) BY MOUTH DAILY 90 tablet 3   nitroGLYCERIN (NITROSTAT) 0.4 MG SL tablet Place 1 tablet (0.4 mg total) under the tongue every 5 (five) minutes as needed for chest pain. 25 tablet 3   ondansetron (ZOFRAN-ODT) 4 MG disintegrating tablet Take 4 mg by mouth every 8 (eight) hours as needed.     pantoprazole (PROTONIX) 40 MG tablet Take 40 mg by mouth daily.     REPATHA SURECLICK 140 MG/ML SOAJ Inject 140 mg into the skin every 14 (fourteen) days.     sacubitril-valsartan (ENTRESTO) 49-51 MG TAKE 1 TABLET BY MOUTH TWICE DAILY 60 tablet 11   zolpidem (AMBIEN) 5  MG tablet Take 2.5 mg by mouth at bedtime.     No current facility-administered medications for this visit.   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 Hives   Bee Venom     Other Reaction(s): red/swelling     Review of Systems: All systems reviewed and negative except where noted in HPI.   Physical Exam: BP 138/70 (BP Location: Right Arm, Patient Position: Sitting, Cuff Size: Normal)   Pulse 64   Ht 5\' 4"  (1.626 m) Comment: height measured without shoes  Wt 153 lb (69.4 kg)   BMI 26.26 kg/m  Constitutional: Pleasant,well-developed, female in no acute distress. HEENT: Normocephalic and atraumatic. Conjunctivae are normal. No scleral icterus. Cardiovascular: Normal rate, regular rhythm.  Pulmonary/chest: Effort normal and breath sounds normal. No wheezing, rales or rhonchi. Abdominal: Soft, nondistended, nontender. Bowel sounds active throughout. There are no masses palpable. No hepatomegaly. Extremities: No edema Neurological: Alert and oriented to person place and time. Skin: Skin is warm and dry. No rashes noted. Psychiatric: Normal mood and affect. Behavior is normal.  Labs 02/2023: CMP nml.  TSH normal.  Labs 06/2023: CBC unremarkable.  BMP  unremarkable.  BNP mildly elevated.  INR mildly elevated at 1.4.  CTA A/P w/contrast 11/07/21: IMPRESSION: VASCULAR 1. Small focal dissection of the abdominal aorta which begins at the level of the SMA and terminates above the bilateral renal arteries. 2. See separately dictated same day CT of the chest for discussion of findings above the diaphragm. NON-VASCULAR 1. Mild soft tissue stranding is seen about the sigmoid colon, decreased when compared with prior CT of the abdomen and pelvis, compatible with resolving acute diverticulitis.  ASSESSMENT AND PLAN:  Alternating constipation and diarrhea Lower ab pain Nausea GERD Patient presents with issues with irregular bowel habits, nausea, and lower abdominal pain that have been longstanding in nature.  She does take Bentyl as needed and Zofran as needed, which do help with her symptoms.  I asked her to look over the low FODMAP diet to see if there are any foods that could be identified as culprits for her symptoms.  Will also plan for further workup with labs and refer to pelvic floor physical therapy in case pelvic floor physical therapy is contributing to her symptoms.  Patient does mention that she takes her 40 minutes to be able to adequately pass a BM.  Patient on her recent set of labs did have an abnormal thyroid level in 04/2023 for which she had her thyroid medication adjusted 2 months ago.  We could consider further abdominal imaging in the future to accurately determine her stool burden.  She does have good bowel sounds on exam today.  Will also give her a trial of IBgard to see if this helps with her symptoms as well. - Low FODMAP diet - Check TSH, TTG IgA, IgA, CRP, alpha gal IgE - Cont Bentyl PRN and Zofran PRN - Continue daily pantoprazole with Tums as needed - Referral to pelvic floor PT with Eulis Foster - Trial of Ibgard - RTC 3 months  Eulah Pont, MD  I spent 45 minutes of time, including in depth chart review,  independent review of results as outlined above, communicating results with the patient directly, face-to-face time with the patient, coordinating care, ordering studies and medications as appropriate, and documentation.

## 2023-07-07 LAB — IGA: Immunoglobulin A: 149 mg/dL (ref 70–320)

## 2023-07-07 LAB — TISSUE TRANSGLUTAMINASE, IGA: (tTG) Ab, IgA: <1 U/mL

## 2023-07-12 DIAGNOSIS — H35372 Puckering of macula, left eye: Secondary | ICD-10-CM | POA: Diagnosis not present

## 2023-07-12 DIAGNOSIS — H5000 Unspecified esotropia: Secondary | ICD-10-CM | POA: Diagnosis not present

## 2023-07-12 DIAGNOSIS — H5203 Hypermetropia, bilateral: Secondary | ICD-10-CM | POA: Diagnosis not present

## 2023-07-12 DIAGNOSIS — H532 Diplopia: Secondary | ICD-10-CM | POA: Diagnosis not present

## 2023-07-12 LAB — O215-IGE ALPHA-GAL: O215-IgE Alpha-Gal: <0.1 kU/L

## 2023-07-20 ENCOUNTER — Other Ambulatory Visit: Payer: Self-pay

## 2023-07-20 ENCOUNTER — Ambulatory Visit: Attending: Internal Medicine

## 2023-07-20 DIAGNOSIS — M6281 Muscle weakness (generalized): Secondary | ICD-10-CM | POA: Diagnosis not present

## 2023-07-20 DIAGNOSIS — R279 Unspecified lack of coordination: Secondary | ICD-10-CM | POA: Insufficient documentation

## 2023-07-20 DIAGNOSIS — R293 Abnormal posture: Secondary | ICD-10-CM | POA: Insufficient documentation

## 2023-07-20 NOTE — Therapy (Signed)
 OUTPATIENT PHYSICAL THERAPY FEMALE PELVIC EVALUATION   Patient Name: Marissa Powers MRN: [id number] DOB:Jan 05, 1941, 83 y.o., female Today's Date: 07/20/2023  END OF SESSION:  PT End of Session - 07/20/23 1531     Visit Number 1    Date for PT Re-Evaluation 10/12/23    Authorization Type HTA    Progress Note Due on Visit 10    PT Start Time 1530    PT Stop Time 1610    PT Time Calculation (min) 40 min    Activity Tolerance Patient tolerated treatment well    Behavior During Therapy [facility] for tasks assessed/performed             Past Medical History:  Diagnosis Date   Arthritis    osteoarthritis   Breast cancer (HCC) 11/2021   CAD (coronary artery disease)    Descending thoracic aortic dissection (HCC)    followed by Dr. 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 Clifton James & Dr. Laneta Simmers .   Severe aortic stenosis    Past Surgical History:  Procedure Laterality Date   AXILLARY SENTINEL NODE BIOPSY Left 12/29/2021   Procedure: LEFT AXILLARY SENTINEL NODE BIOPSY;  Surgeon: Emelia Loron, MD;  Location: [facility] OR;  Service: General;  Laterality: Left;   BREAST BIOPSY Bilateral 1970's   x2 one a side   BREAST LUMPECTOMY WITH RADIOACTIVE SEED LOCALIZATION Left 12/29/2021   Procedure: BREAST LUMPECTOMY WITH RADIOACTIVE SEED LOCALIZATION X2;  Surgeon: Emelia Loron, MD;  Location: [facility] OR;  Service: General;  Laterality: Left;   CATARACT EXTRACTION     bilateral   implantable loop recorder removal   05/30/2021   MDT reveal LINQ removed by Dr Johney Frame   INTRAOPERATIVE TRANSTHORACIC ECHOCARDIOGRAM N/A 08/19/2021   Procedure: INTRAOPERATIVE TRANSTHORACIC ECHOCARDIOGRAM;  Surgeon: Kathleene Hazel, MD;  Location: [facility] OR;  Service: Open Heart Surgery;  Laterality: N/A;   LOOP RECORDER INSERTION N/A 08/11/2017   Procedure: LOOP RECORDER INSERTION;  Surgeon: Hillis Range, MD;  Location: [facility] INVASIVE CV LAB;  Service: Cardiovascular;  Laterality: N/A;   RADIOACTIVE SEED GUIDED AXILLARY SENTINEL LYMPH NODE Left 12/29/2021   Procedure: LEFT AXILLARY NODE SEED GUIDED EXCISION;  Surgeon: Emelia Loron, MD;  Location: [facility] OR;  Service: General;  Laterality: Left;   RETINAL DETACHMENT SURGERY  2000   RIGHT/LEFT HEART CATH AND CORONARY ANGIOGRAPHY N/A 06/19/2021   Procedure: RIGHT/LEFT HEART CATH AND CORONARY ANGIOGRAPHY;  Surgeon: Lyn Records, MD;  Location: [facility] INVASIVE CV LAB;  Service: Cardiovascular;  Laterality: N/A;   THYROIDECTOMY  1967   TONSILLECTOMY     TOTAL KNEE ARTHROPLASTY  04/25/2012   Procedure: TOTAL KNEE BILATERAL;  Surgeon: Loanne Drilling, MD;  Location: [facility] ORS;  Service: Orthopedics;  Laterality: Bilateral;   TRANSCATHETER AORTIC VALVE REPLACEMENT, TRANSFEMORAL N/A 08/19/2021   Procedure: Transcatheter Aortic Valve Replacement, Transfemoral Using 23mm Sapien 3 Ultra Edwards Valve;  Surgeon: Verne Carrow  D, MD;  Location: [facility] OR;  Service: Open Heart Surgery;  Laterality: N/A;   Patient Active Problem List   Diagnosis Date Noted   Paroxysmal supraventricular tachycardia (HCC) 06/23/2022   Irritable bowel syndrome 06/23/2022   Giant cell arteritis (HCC) 06/23/2022   Cerebral atherosclerosis 06/23/2022   Acquired hypothyroidism 06/23/2022   Benign essential hypertension 06/23/2022   Sigmoid diverticulitis 10/18/2021   Genetic testing 09/12/2021   Family history of breast cancer 08/28/2021   Polymyalgia rheumatica (HCC) 08/19/2021   S/P TAVR (transcatheter aortic  valve replacement) 08/19/2021   Senile osteoporosis 08/15/2021   Malignant neoplasm of lower-outer quadrant of left breast of female, estrogen receptor positive (HCC) 07/28/2021   Severe aortic stenosis 06/19/2021   Secondary hypercoagulable state (HCC) 08/28/2019   Coronary artery disease of native artery of native heart with stable angina pectoris (HCC) 09/15/2018   Paroxysmal atrial fibrillation (HCC) 10/04/2017   Lichen sclerosus 04/02/2016    Class: History of   Hypertension    GERD (gastroesophageal reflux disease)    Arthritis    Hypercholesteremia    OA (osteoarthritis) of knee 04/25/2012   Fibroid 2002   Dysplasia of cervix, low grade (CIN 1) 1990    PCP: Rodrigo Ran, MD  REFERRING PROVIDER: Imogene Burn, MD   REFERRING DIAG: R19.7 (ICD-10-CM) - Diarrhea, unspecified type R11.0 (ICD-10-CM) - Nausea without vomiting K21.9 (ICD-10-CM) - Gastroesophageal reflux disease, unspecified whether esophagitis present R10.30 (ICD-10-CM) - Lower abdominal pain  THERAPY DIAG:  Muscle weakness (generalized)  Unspecified lack of coordination  Abnormal posture  Rationale for Evaluation and Treatment: Rehabilitation  ONSET DATE: chronic  SUBJECTIVE:                                                                                                                                                                                           SUBJECTIVE STATEMENT: Pt states that she has been having abdominal issues intermittently for a very long time. She states that her main problem cramping, diarrhea/constipation. She feels like her diet does not impact this at all. She is not taking any fiber.  Fluid intake: She is trying to drink a lot of water, but does not get 64oz  PAIN:  Are you having pain? No   PRECAUTIONS: Other: Hx breast cancer 2023 and currently on medication  RED FLAGS: None   WEIGHT BEARING RESTRICTIONS: No  FALLS:  Has patient fallen in last 6 months?  No  OCCUPATION: not working  ACTIVITY LEVEL : walking, trainer 1x/week  PLOF: Independent  PATIENT GOALS: pt would like to have less gas, more consistent bowel movements with complete emptying  PERTINENT HISTORY:  Diverticulitis, hemorrhoids, hx breast cancer 2023, breast lumpectomy, thyroidectomy, bil TKA, hyperthyroidism, polymyalgia rheumatica, osteoarthritis, lichens sclerosis    BOWEL MOVEMENT: Pain with bowel movement: No Type of bowel movement:Frequency 1x and Strain yes  - when stools are mushy they are hard to get out completely Fully empty rectum: Yes: usually Leakage: No Pads: No Fiber supplement/laxative No  URINATION: Pain with urination: No Fully empty bladder: 90% of the time  Stream: Strong Urgency: No Frequency: WNL Leakage: none Pads: No  INTERCOURSE: Not sexually active for long time  PREGNANCY: Vaginal deliveries 2 Tearing No Episiotomy No C-section deliveries 0 Currently pregnant No  PROLAPSE: None   OBJECTIVE:  Note: Objective measures were completed at Evaluation unless otherwise noted.  07/20/23:  PATIENT SURVEYS:   PFIQ-7: 86  COGNITION: Overall cognitive status: Within functional limits for tasks assessed     SENSATION: Light touch: Appears intact  FUNCTIONAL TESTS:  Squat: WNL Single leg stance:  Rt: pelvic drop  Lt: pelvic drop Curl-up test: significant abdominal distortion throughout midline    GAIT: Assistive device utilized: None Comments: WNL  POSTURE: rounded shoulders, forward head, decreased lumbar lordosis, increased thoracic kyphosis, posterior pelvic tilt, and very mild Lt thoracic curvature, Rt posterior pelvic rotation    LUMBARAROM/PROM:  A/PROM A/PROM  Eval (% available)  Flexion 75  Extension 50  Right lateral flexion 50  Left lateral flexion 50  Right rotation 50  Left rotation 50   (Blank rows = not tested)   PALPATION:  Pelvic Alignment: Rt posterior rotation   Abdominal: no  tenderness or restriction noted today                External Perineal Exam: lichens plaques, fragile tissue, phimosis, labial fusion, decreased vulvar mobility                             Internal Pelvic Floor: atrophy; poor generation of intra-rectal pressure with push during rectal exam   Patient confirms identification and approves PT to assess internal pelvic floor and treatment Yes  PELVIC MMT:   MMT eval  Vaginal None initially; with training 1/5, 6 second hold, and 3 repeat contractions   Internal Anal Sphincter 1/5  External Anal Sphincter 1/5  Puborectalis 1/5  Diastasis Recti 1 finger width  (Blank rows = not tested)        TONE: low  PROLAPSE: Grade 1 anterior/grade 2 posterior vaginal wall laxity  TODAY'S TREATMENT:                                                                                                                              DATE:  07/20/23  EVAL  Neuromuscular re-education: Pt provides verbal consent for internal vaginal/rectal pelvic floor exam. Internal vaginal and rectal pelvic floor muscle contraction training for quick flicks with breath coordination  Therapeutic activities: Squatty potty/relaxed toilet mechanics Exhale with pushing Peri bottle and water  wipes to help clean Bowel massage    PATIENT EDUCATION:  Education details: See above  Person educated: Patient Education method: Explanation, Demonstration, Tactile cues, Verbal cues, and Handouts Education comprehension: verbalized understanding  HOME EXERCISE PROGRAM: [id number]  ASSESSMENT:  CLINICAL IMPRESSION: Patient is a 83 y.o. female who was seen today for physical therapy evaluation and treatment for dysfunction with bowel movements. Exam findings notable for decreased lumbar A/ROM, abnormal posture, pelvic instability with single leg stance, significant abdominal distortion with curl-up test, inability to perform active pelvic floor muscle contraction vaginally and rectally  prior to training, post training pelvic floor muscle weakness and decreased endurance, lichens plaques throughout perineum, fragile vulvar tissue, poor generation of intra-rectal pressure with push, very poor coordination of pelvic floor/hip muscles/core. Signs and symptoms may be consistent with overall decrease in activity, poor push mechanics with bowel movements, and pelvic floor muscle weakness/decreased coordination. Initial treatment consisted of pelvic floor muscle contraction training vaginally and rectally; we also discussed splinting, squatty potty, relaxed toilet mechanics, wet wipes/peri bottle for gentle cleaning, and self bowel massage. She may continue to benefit from skilled PT intervention in order to improve pelvic floor muscle strength, improve complete bowel emptying, optimize push mechanics, begin functional strengthening progrma.   OBJECTIVE IMPAIRMENTS: decreased activity tolerance, decreased coordination, decreased endurance, decreased mobility, decreased strength, increased fascial restrictions, increased muscle spasms, impaired tone, postural dysfunction, and pain.   ACTIVITY LIMITATIONS:  bowel movements   PARTICIPATION LIMITATIONS: community activity  PERSONAL FACTORS: 3+ comorbidities: medical history  are also affecting patient's functional outcome.   REHAB POTENTIAL: Good  CLINICAL DECISION MAKING: Evolving/moderate complexity  EVALUATION COMPLEXITY: Moderate   GOALS: Goals reviewed with patient? Yes  SHORT TERM GOALS: Target date: 08/17/2023   Pt will be independent with HEP.   Baseline: Goal status: INITIAL  2.  Pt will be independent with use of squatty potty, relaxed toileting mechanics, and improved bowel movement techniques in order to increase ease of bowel movements and complete evacuation.   Baseline:  Goal status: INITIAL  3.  Pt will be able to generate improved intra-rectal pressure to help with complete evacuation of bowel movement.   Baseline:  Goal status: INITIAL  4.  Pt will report 25% improvement in symptoms.  Baseline:  Goal status: INITIAL   LONG TERM GOALS: Target date: 10/12/23  Pt will be independent with advanced HEP.   Baseline:  Goal status: INITIAL  2.  Pt will report 75% improvement in symptoms.  Baseline:  Goal status: INITIAL  3.  Pt will be able to perform curl-up test without abdominal distortion.  Baseline:  Goal status: INITIAL  4.  Pt will be able to perform single leg stance without pelvic drop to demonstrate improved core facilitation.  Baseline:  Goal status: INITIAL  5.  Pt will demonstrate normal pelvic floor muscle tone and A/ROM, able to achieve 3/5 strength with contractions and 10 sec endurance, in order to provide appropriate lumbopelvic support in functional activities.   Baseline:  Goal status: INITIAL  6.  Pt will demonstrate <25 on PFIQ-7 to demonstrate decreased functional impact of bowel dysfunction.  Baseline:  Goal status: INITIAL  PLAN:  PT FREQUENCY: 1-2x/week  PT DURATION: 12 weeks  PLANNED INTERVENTIONS: 97110-Therapeutic exercises, 97530- Therapeutic activity, 97112- Neuromuscular re-education, 97535- Self Care, [id number]- Manual therapy, Dry Needling, and Biofeedback  PLAN FOR NEXT SESSION: bowel mobilization; stretches; begin core training    Julio Alm, PT, DPT04/08/255:14 PM

## 2023-07-20 NOTE — Patient Instructions (Addendum)
 Squatty potty: When your knees are level or below the level of your hips, pelvic floor muscles are pressed against rectum, preventing ease of bowel movement. By getting knees above the level of the hips, these pelvic floor muscles relax, allowing easier passage of bowel movement. Ways to get knees above hips: o Squatty Potty (7inch and 9inch versions) o Small stool o Roll of toilet paper under each foot o Hardback book or stack of magazines under each foot  Relaxed Toileting mechanics: Once in this position, make sure to lean forward with forearms on thighs, wide knees, relaxed stomach, and breathe.    Exhale like you're blowing up a really tight balloon when trying to have a bowel movement.    To help clean, use a peri bottle and water wipes.   Splinting: take wad of toilet paper and press at vaginal opening up and back towards rectum when trying to have a bowel movement; this can support the back vaginal wall.   Bowel massage: To assist with more regular and more comfortable bowel movements, try performing bowel massage nightly for 5-10 minutes. Place hands in the lower right side of your abdomen to start; in small circles, massage up, across, and down the left side of your abdomen. Pressure does not need to be hard, but just comfortable. You can use lotion or oil to make more comfortable.  [facility] [address] [place], [place] [address] Phone # [phone] Fax [phone]

## 2023-07-22 DIAGNOSIS — I2510 Atherosclerotic heart disease of native coronary artery without angina pectoris: Secondary | ICD-10-CM | POA: Diagnosis not present

## 2023-07-22 DIAGNOSIS — R410 Disorientation, unspecified: Secondary | ICD-10-CM | POA: Diagnosis not present

## 2023-07-22 DIAGNOSIS — I480 Paroxysmal atrial fibrillation: Secondary | ICD-10-CM | POA: Diagnosis not present

## 2023-07-22 DIAGNOSIS — G453 Amaurosis fugax: Secondary | ICD-10-CM | POA: Diagnosis not present

## 2023-07-22 DIAGNOSIS — I5032 Chronic diastolic (congestive) heart failure: Secondary | ICD-10-CM | POA: Diagnosis not present

## 2023-07-22 DIAGNOSIS — I350 Nonrheumatic aortic (valve) stenosis: Secondary | ICD-10-CM | POA: Diagnosis not present

## 2023-07-26 DIAGNOSIS — H5000 Unspecified esotropia: Secondary | ICD-10-CM | POA: Diagnosis not present

## 2023-07-26 DIAGNOSIS — H532 Diplopia: Secondary | ICD-10-CM | POA: Diagnosis not present

## 2023-07-28 ENCOUNTER — Emergency Department (HOSPITAL_COMMUNITY)

## 2023-07-28 ENCOUNTER — Emergency Department (HOSPITAL_COMMUNITY)
Admission: EM | Admit: 2023-07-28 | Discharge: 2023-07-28 | Disposition: A | Discharge status: Home / Self Care | Attending: Emergency Medicine | Admitting: Emergency Medicine

## 2023-07-28 ENCOUNTER — Other Ambulatory Visit: Payer: Self-pay

## 2023-07-28 DIAGNOSIS — R0602 Shortness of breath: Secondary | ICD-10-CM | POA: Diagnosis not present

## 2023-07-28 DIAGNOSIS — I10 Essential (primary) hypertension: Secondary | ICD-10-CM | POA: Insufficient documentation

## 2023-07-28 DIAGNOSIS — I2510 Atherosclerotic heart disease of native coronary artery without angina pectoris: Secondary | ICD-10-CM | POA: Insufficient documentation

## 2023-07-28 DIAGNOSIS — Z7901 Long term (current) use of anticoagulants: Secondary | ICD-10-CM | POA: Diagnosis not present

## 2023-07-28 DIAGNOSIS — I480 Paroxysmal atrial fibrillation: Secondary | ICD-10-CM | POA: Diagnosis not present

## 2023-07-28 DIAGNOSIS — Z79899 Other long term (current) drug therapy: Secondary | ICD-10-CM | POA: Diagnosis not present

## 2023-07-28 DIAGNOSIS — R5383 Other fatigue: Secondary | ICD-10-CM | POA: Diagnosis present

## 2023-07-28 LAB — COMPREHENSIVE METABOLIC PANEL WITH GFR
ALT: 10 U/L (ref 0–44)
AST: 17 U/L (ref 15–41)
Albumin: 4 g/dL (ref 3.5–5.0)
Alkaline Phosphatase: 49 U/L (ref 38–126)
Anion gap: 12 (ref 5–15)
BUN: 13 mg/dL (ref 8–23)
CO2: 20 mmol/L — ABNORMAL LOW (ref 22–32)
Calcium: 9.8 mg/dL (ref 8.9–10.3)
Chloride: 108 mmol/L (ref 98–111)
Creatinine, Ser: 0.76 mg/dL (ref 0.44–1.00)
GFR, Estimated: >60 mL/min (ref >60)
Glucose, Bld: 102 mg/dL — ABNORMAL HIGH (ref 70–99)
Potassium: 3.5 mmol/L (ref 3.5–5.1)
Sodium: 140 mmol/L (ref 135–145)
Total Bilirubin: 1 mg/dL (ref 0.0–1.2)
Total Protein: 7.4 g/dL (ref 6.5–8.1)

## 2023-07-28 LAB — CBC
HCT: 37.4 % (ref 36.0–46.0)
Hemoglobin: 12.2 g/dL (ref 12.0–15.0)
MCH: 33.6 pg (ref 26.0–34.0)
MCHC: 32.6 g/dL (ref 30.0–36.0)
MCV: 103 fL — ABNORMAL HIGH (ref 80.0–100.0)
Platelets: 190 10*3/uL (ref 150–400)
RBC: 3.63 MIL/uL — ABNORMAL LOW (ref 3.87–5.11)
RDW: 13.2 % (ref 11.5–15.5)
WBC: 6.3 10*3/uL (ref 4.0–10.5)
nRBC: 0 % (ref 0.0–0.2)

## 2023-07-28 LAB — MAGNESIUM: Magnesium: 2.1 mg/dL (ref 1.7–2.4)

## 2023-07-28 MED ORDER — SODIUM CHLORIDE 0.9 % IV SOLN
INTRAVENOUS | Status: AC | PRN
  Administered 2023-07-28: 1000 mL via INTRAVENOUS

## 2023-07-28 MED ORDER — PROPOFOL 10 MG/ML IV BOLUS
0.5000 mg/kg | Freq: Once | INTRAVENOUS | Status: DC
  Filled 2023-07-28: qty 20

## 2023-07-28 MED ORDER — PROPOFOL 10 MG/ML IV BOLUS
INTRAVENOUS | Status: AC | PRN
  Administered 2023-07-28: 34.25 mg via INTRAVENOUS

## 2023-07-28 MED ORDER — METOPROLOL SUCCINATE ER 25 MG PO TB24
50.0000 mg | ORAL_TABLET | Freq: Every day | ORAL | 3 refills | Status: DC
Start: 2023-07-28 — End: 2023-08-13

## 2023-07-28 NOTE — Progress Notes (Addendum)
 Discussed the patient's progression while in the ED with Dr. Monnie Anthony.  She presented with A-fib with RVR at about 130 bpm and was successfully cardioverted.  In sinus rhythm her heart rate is in the 70s.  Recommend increasing the metoprolol succinate to 50 mg daily, which will hopefully prevent symptoms from RVR.  She appears to be a good candidate for antiarrhythmic therapy or preferably ablation for atrial fibrillation.  She has an upcoming visit in the A-fib clinic where this can be discussed further.

## 2023-07-28 NOTE — Discharge Instructions (Addendum)
 Increase your metoprolol to 50 mg daily.  Follow up with the cardiology office to discuss further treatment options for your atrial fibrillation as we discussed.  Avoid any alcohol or driving today due to the sedation medications given in the ED.  You should have no restrictions tomorrow.

## 2023-07-28 NOTE — ED Provider Notes (Signed)
 [place] EMERGENCY DEPARTMENT AT [facility] Provider Note   CSN: [id number] Arrival date & time: 07/28/23  1317     History  Chief Complaint  Patient presents with   Fatigue   Shortness of Breath    Marissa Powers is a 83 y.o. female.   Shortness of Breath    Patient has a history of arthritis, hypercholesterolemia, hypertension, acid reflux, paroxysmal atrial fibrillation, coronary artery disease, aortic stenosis, polymyalgia rheumatica, diverticulosis, hypertension.  Patient states she is on chronic anticoagulation.  She has not missed any doses over the last 3 weeks.  She noticed today that when she was walking around she was feeling more fatigued than usual.  She was getting a little more winded.  She was with a friend of hers who is a retired [place].  They checked her pulse and it was tachycardic in the 130s and irregular.  Patient is not had any trouble with any chest pain.  She has not had any fevers or chills.  No vomiting or diarrhea.  Home Medications Prior to Admission medications   Medication Sig Start Date End Date Taking? Authorizing Provider  acetaminophen (TYLENOL) 500 MG tablet Take 1,000 mg by mouth every 6 (six) hours as needed for moderate pain.    [provider]  apixaban (ELIQUIS) 5 MG TABS tablet TAKE 1 TABLET(5 MG) BY MOUTH TWICE DAILY 06/10/23   Bensimhon, Bevelyn Buckles, MD  b complex vitamins capsule Take 1 capsule by mouth daily.    [provider]  Calcium Carb-Cholecalciferol (CALCIUM + D3 PO) Take 1 tablet by mouth in the morning.    [provider]  cholecalciferol (VITAMIN D3) 25 MCG (1000 UNIT) tablet Take 1,000 Units by mouth in the morning.    [provider]  dapagliflozin propanediol (FARXIGA) 10 MG TABS tablet Take 1 tablet (10 mg total) by mouth daily before breakfast. 01/27/23   Janetta Hora, PA-C  denosumab (PROLIA) 60 MG/ML SOSY injection Inject 60 mg into the skin every 6 (six) months.     [provider]  folic acid (FOLVITE) 1 MG tablet Take 2 mg by mouth in the morning.    [provider]  letrozole (FEMARA) 2.5 MG tablet TAKE 1 TABLET(2.5 MG) BY MOUTH DAILY 05/11/23   Serena Croissant, MD  levothyroxine (SYNTHROID) 100 MCG tablet Take 100 mcg by mouth daily before breakfast.    [provider]  loperamide (IMODIUM) 2 MG capsule Take 2-4 mg by mouth 4 (four) times daily as needed (upset stomach/diarrhea/loose stools).    [provider]  metoprolol succinate (TOPROL-XL) 25 MG 24 hr tablet TAKE 1 TABLET(25 MG) BY MOUTH DAILY 05/18/23   Bensimhon, Bevelyn Buckles, MD  nitroGLYCERIN (NITROSTAT) 0.4 MG SL tablet Place 1 tablet (0.4 mg total) under the tongue every 5 (five) minutes as needed for chest pain. 07/05/20   Lyn Records, MD  ondansetron (ZOFRAN-ODT) 4 MG disintegrating tablet Take 4 mg by mouth every 8 (eight) hours as needed. 06/03/23   [provider]  pantoprazole (PROTONIX) 40 MG tablet Take 40 mg by mouth daily. 01/08/23   [provider]  REPATHA SURECLICK 140 MG/ML SOAJ Inject 140 mg into the skin every 14 (fourteen) days. 08/06/21   [provider]  sacubitril-valsartan (ENTRESTO) 49-51 MG TAKE 1 TABLET BY MOUTH TWICE DAILY 09/02/22   Bensimhon, Bevelyn Buckles, MD  zolpidem (AMBIEN) 5 MG tablet Take 2.5 mg by mouth at bedtime.    [provider]  Allergies    Cashew nut oil, Cashew nut (anacardium occidentale) skin test, and Bee venom    Review of Systems   Review of Systems  Respiratory:  Positive for shortness of breath.     Physical Exam Updated Vital Signs BP 103/70 (BP Location: Right Arm)   Pulse 95   Temp 97.9 F (36.6 C) (Oral)   Resp (!) 22   Ht 1.651 m (5\' 5" )   Wt 68.5 kg   SpO2 100%   BMI 25.13 kg/m  Physical Exam Vitals and nursing note reviewed.  Constitutional:      General: She is not in acute distress.    Appearance: She is well-developed.  HENT:     Head: Normocephalic and  atraumatic.     Right Ear: External ear normal.     Left Ear: External ear normal.  Eyes:     General: No scleral icterus.       Right eye: No discharge.        Left eye: No discharge.     Conjunctiva/sclera: Conjunctivae normal.  Neck:     Trachea: No tracheal deviation.  Cardiovascular:     Rate and Rhythm: Tachycardia present. Rhythm irregular.  Pulmonary:     Effort: Pulmonary effort is normal. No respiratory distress.     Breath sounds: Normal breath sounds. No stridor. No wheezing or rales.  Abdominal:     General: Bowel sounds are normal. There is no distension.     Palpations: Abdomen is soft.     Tenderness: There is no abdominal tenderness. There is no guarding or rebound.  Musculoskeletal:        General: No tenderness or deformity.     Cervical back: Neck supple.  Skin:    General: Skin is warm and dry.     Findings: No rash.  Neurological:     General: No focal deficit present.     Mental Status: She is alert.     Cranial Nerves: No cranial nerve deficit, dysarthria or facial asymmetry.     Sensory: No sensory deficit.     Motor: No abnormal muscle tone or seizure activity.     Coordination: Coordination normal.  Psychiatric:        Mood and Affect: Mood normal.     ED Results / Procedures / Treatments   Labs (all labs ordered are listed, but only abnormal results are displayed) Labs Reviewed  COMPREHENSIVE METABOLIC PANEL WITH GFR - Abnormal; Notable for the following components:      Result Value   CO2 20 (*)    Glucose, Bld 102 (*)    All other components within normal limits  CBC - Abnormal; Notable for the following components:   RBC 3.63 (*)    MCV 103.0 (*)    All other components within normal limits  MAGNESIUM    EKG EKG Interpretation Date/Time:  Wednesday July 28 2023 13:25:14 EDT Ventricular Rate:  129 PR Interval:    QRS Duration:  86 QT Interval:  334 QTC Calculation: 489 R Axis:   -9  Text Interpretation: Atrial fibrillation  with rapid ventricular response Low voltage QRS Cannot rule out Anterior infarct , age undetermined Abnormal ECG When compared with ECG of 18-Jun-2023 08:39, a fib new since last tracing Confirmed by Linwood Dibbles [phone]) on 07/28/2023 1:45:16 PM  Radiology No results found.  Procedures .Sedation  Date/Time: 07/28/2023 2:05 PM  Performed by: Linwood Dibbles, MD Authorized by: Linwood Dibbles, MD   Consent:  Consent obtained:  Verbal   Consent given by:  Patient   Risks discussed:  Allergic reaction, dysrhythmia, inadequate sedation, nausea, prolonged hypoxia resulting in organ damage, prolonged sedation necessitating reversal, respiratory compromise necessitating ventilatory assistance and intubation and vomiting   Alternatives discussed:  Analgesia without sedation Universal protocol:    Immediately prior to procedure, a time out was called: yes     Patient identity confirmed:  Verbally with patient Indications:    Procedure performed:  Cardioversion   Procedure necessitating sedation performed by:  Physician performing sedation Pre-sedation assessment:    Time since last food or drink:  1.5   NPO status caution: urgency dictates proceeding with non-ideal NPO status     ASA classification: class 2 - patient with mild systemic disease     Mouth opening:  3 or more finger widths   Thyromental distance:  3 finger widths   Mallampati score:  II - soft palate, uvula, fauces visible   Neck mobility: normal     Pre-sedation assessments completed and reviewed: airway patency, cardiovascular function, hydration status, mental status, nausea/vomiting, pain level, respiratory function and temperature   A pre-sedation assessment was completed prior to the start of the procedure Procedure details (see MAR for exact dosages):    Sedation:  Propofol   Intended level of sedation: deep   Total Provider sedation time (minutes):  15 Post-procedure details:   A post-sedation assessment was completed following  the completion of the procedure.   Attendance: Constant attendance by certified staff until patient recovered     Recovery: Patient returned to pre-procedure baseline     Post-sedation assessments completed and reviewed: airway patency, cardiovascular function, hydration status, mental status, nausea/vomiting, pain level, respiratory function and temperature     Patient is stable for discharge or admission: yes     Procedure completion:  Tolerated well, no immediate complications .Cardioversion  Date/Time: 07/28/2023 2:59 PM  Performed by: Trish Furl, MD Authorized by: Trish Furl, MD   Consent:    Consent obtained:  Verbal and written   Consent given by:  Patient   Alternatives discussed:  Rate-control medication Universal protocol:    Procedure explained and questions answered to patient or proxy's satisfaction: yes     Patient identity confirmed:  Verbally with patient Pre-procedure details:    Cardioversion basis:  Emergent   Rhythm:  Atrial fibrillation   Electrode placement:  Anterior-posterior Patient sedated: Yes. Refer to sedation procedure documentation for details of sedation.  Attempt one:    Cardioversion mode:  Synchronous   Waveform:  Biphasic   Shock (Joules):  150   Shock outcome:  Conversion to normal sinus rhythm Post-procedure details:    Patient status:  Awake   Patient tolerance of procedure:  Tolerated well, no immediate complications     Medications Ordered in ED Medications  propofol (DIPRIVAN) 10 mg/mL bolus/IV push 34.3 mg (has no administration in time range)  propofol (DIPRIVAN) 10 mg/mL bolus/IV push (34.25 mg Intravenous Given 07/28/23 1448)  0.9 %  sodium chloride infusion (1,000 mLs Intravenous New Bag/Given 07/28/23 1446)    ED Course/ Medical Decision Making/ A&P Clinical Course as of 07/28/23 1539  Wed Jul 28, 2023  1404 Discussed options with patient.  She is interested in a cardioversion procedure in the ED. [JK]  1522 Case discussed  with Dr C.  WOuld increase to metoprolol to 50 mg [JK]  1538 Chest x-ray reviewed.  No acute finding noted [JK]    Clinical Course User  Index [JK] Trish Furl, MD                                 Medical Decision Making Problems Addressed: Paroxysmal atrial fibrillation [facility]): acute illness or injury that poses a threat to life or bodily functions  Amount and/or Complexity of Data Reviewed Labs: ordered. Decision-making details documented in ED Course. Radiology: ordered and independent interpretation performed.  Risk Prescription drug management.   Patient presented to the ED for evaluation of recurrent atrial fibrillation.  Patient has been taking her Eliquis and has not missed any doses.  We discussed options of rate control versus cardioversion.  Patient was interested in cardioversion during the ED.  He underwent cardioversion and tolerated the procedure well.  She is back in normal sinus rhythm.  I discussed the case with Dr. Tita Form (cardiology).  We will have the patient increase her metoprolol to 50 mg daily.  She will follow-up in the A-fib clinic to discuss further treatment possible ablation procedure.  Evaluation and diagnostic testing in the emergency department does not suggest an emergent condition requiring admission or immediate intervention beyond what has been performed at this time.  The patient is safe for discharge and has been instructed to return immediately for worsening symptoms, change in symptoms or any other concerns.          Final Clinical Impression(s) / ED Diagnoses Final diagnoses:  Paroxysmal atrial fibrillation [facility])    Rx / DC Orders ED Discharge Orders          Ordered    Amb referral to AFIB Clinic        07/28/23 1349              Trish Furl, MD 07/28/23 1553

## 2023-07-28 NOTE — ED Notes (Signed)
 Pt will be riding with Cary Clarks.

## 2023-07-28 NOTE — ED Triage Notes (Signed)
 This morning pt was walking dog and became SOB and has been fatigued all day. Has hx of afib and this feels similar. Pt takes blood thinners and is compliant. Denies chest pain

## 2023-07-28 NOTE — ED Notes (Signed)
 Nt called CCMD to put pt on monitor

## 2023-07-28 NOTE — Sedation Documentation (Signed)
 150 J synchronized shocked by Dr. Monnie Anthony

## 2023-07-28 NOTE — Sedation Documentation (Signed)
 Pt NSR HR 75 Converted

## 2023-08-09 DIAGNOSIS — R410 Disorientation, unspecified: Secondary | ICD-10-CM | POA: Diagnosis not present

## 2023-08-09 DIAGNOSIS — I350 Nonrheumatic aortic (valve) stenosis: Secondary | ICD-10-CM | POA: Diagnosis not present

## 2023-08-09 DIAGNOSIS — I5032 Chronic diastolic (congestive) heart failure: Secondary | ICD-10-CM | POA: Diagnosis not present

## 2023-08-09 DIAGNOSIS — R000 Tachycardia, unspecified: Secondary | ICD-10-CM | POA: Diagnosis not present

## 2023-08-09 DIAGNOSIS — E039 Hypothyroidism, unspecified: Secondary | ICD-10-CM | POA: Diagnosis not present

## 2023-08-09 DIAGNOSIS — I2510 Atherosclerotic heart disease of native coronary artery without angina pectoris: Secondary | ICD-10-CM | POA: Diagnosis not present

## 2023-08-09 DIAGNOSIS — R600 Localized edema: Secondary | ICD-10-CM | POA: Diagnosis not present

## 2023-08-09 DIAGNOSIS — I480 Paroxysmal atrial fibrillation: Secondary | ICD-10-CM | POA: Diagnosis not present

## 2023-08-09 DIAGNOSIS — I110 Hypertensive heart disease with heart failure: Secondary | ICD-10-CM | POA: Diagnosis not present

## 2023-08-09 DIAGNOSIS — G453 Amaurosis fugax: Secondary | ICD-10-CM | POA: Diagnosis not present

## 2023-08-09 DIAGNOSIS — M353 Polymyalgia rheumatica: Secondary | ICD-10-CM | POA: Diagnosis not present

## 2023-08-10 DIAGNOSIS — M549 Dorsalgia, unspecified: Secondary | ICD-10-CM | POA: Diagnosis not present

## 2023-08-10 DIAGNOSIS — E663 Overweight: Secondary | ICD-10-CM | POA: Diagnosis not present

## 2023-08-10 DIAGNOSIS — Z7952 Long term (current) use of systemic steroids: Secondary | ICD-10-CM | POA: Diagnosis not present

## 2023-08-10 DIAGNOSIS — M1991 Primary osteoarthritis, unspecified site: Secondary | ICD-10-CM | POA: Diagnosis not present

## 2023-08-10 DIAGNOSIS — M316 Other giant cell arteritis: Secondary | ICD-10-CM | POA: Diagnosis not present

## 2023-08-10 DIAGNOSIS — Z6825 Body mass index (BMI) 25.0-25.9, adult: Secondary | ICD-10-CM | POA: Diagnosis not present

## 2023-08-10 DIAGNOSIS — M353 Polymyalgia rheumatica: Secondary | ICD-10-CM | POA: Diagnosis not present

## 2023-08-10 DIAGNOSIS — M8580 Other specified disorders of bone density and structure, unspecified site: Secondary | ICD-10-CM | POA: Diagnosis not present

## 2023-08-13 ENCOUNTER — Inpatient Hospital Stay (HOSPITAL_COMMUNITY)
Admission: RE | Admit: 2023-08-13 | Discharge: 2023-08-13 | Disposition: A | Discharge status: Home / Self Care | Source: Ambulatory Visit | Attending: Internal Medicine | Admitting: Internal Medicine

## 2023-08-13 ENCOUNTER — Other Ambulatory Visit (HOSPITAL_COMMUNITY): Payer: Self-pay | Admitting: *Deleted

## 2023-08-13 ENCOUNTER — Ambulatory Visit (HOSPITAL_COMMUNITY)
Admission: RE | Admit: 2023-08-13 | Discharge: 2023-08-13 | Disposition: A | Discharge status: Home / Self Care | Source: Ambulatory Visit | Attending: Internal Medicine | Admitting: Internal Medicine

## 2023-08-13 VITALS — BP 128/80 | HR 60 | Ht 65.0 in | Wt 150.0 lb

## 2023-08-13 DIAGNOSIS — D6869 Other thrombophilia: Secondary | ICD-10-CM | POA: Diagnosis not present

## 2023-08-13 DIAGNOSIS — I4891 Unspecified atrial fibrillation: Secondary | ICD-10-CM

## 2023-08-13 DIAGNOSIS — I480 Paroxysmal atrial fibrillation: Secondary | ICD-10-CM | POA: Diagnosis not present

## 2023-08-13 MED ORDER — METOPROLOL SUCCINATE ER 25 MG PO TB24: 25.0000 mg | ORAL_TABLET | Freq: Every day | ORAL | Status: DC

## 2023-08-13 MED ORDER — AMIODARONE HCL 200 MG PO TABS: ORAL_TABLET | ORAL | 3 refills | Status: DC

## 2023-08-13 NOTE — Progress Notes (Signed)
 Primary Care Physician: Aldo Hun, MD Referring Physician: Dr. Genelle Kennedy Primary Cardiologist: Dr Felipe Horton Primary EP: Dr Nunzio Belch   Marissa Powers is a 83 y.o. female with a h/o palpitations, htn, hypothyroidism, CAD.She saw Dr. Genelle Kennedy, 4/29, for her annual physical and mentioned rapid heart beat in the middle of the night, which sounded suspicious  for afib and referred here for further evaluation. The pt brings in recorded  V/S, where she had an episode for around 4 hours with HR's in the 100-120 bpm range in March. She went around 4-6  weeks before she had similar symptoms. She states that she may snore, but she is a widow so no one in the house to report apnea. She reports that she sleeps well and does not have daytime somnolence. She does drink alcohol  several times a week, 1-2 drinks at a time. She has been on metoprolol  for years for palpitations and will usually take an extra 1/2 tab of metoprolol  when she has an episode. She takes a baby ASA a day. Chadsvasc score is at least 4. No tobacco use, minimal caffeine use. Her son is a Scientist, research (life sciences) in [place].  On follow up 08/13/23, she is currently in NSR. Recently in ED on 07/28/23 for Afib with RVR s/p successful DCCV. Metoprolol  increased to 50 mg daily. History of being on amiodarone  which was discontinued April 2024 (she was taking it 3 days a week). She has not had recurrence of Afib since cardioversion. She does not feel at night intermittent sensations of what she believes to be PVCs or a "clunking" feeling. No missed doses of Eliquis  5 mg BID.   Today, she denies symptoms of palpitations, chest pain, shortness of breath, orthopnea, PND, lower extremity edema, dizziness, presyncope, syncope, or neurologic sequela. The patient is tolerating medications without difficulties and is otherwise without complaint today.   Past Medical History:  Diagnosis Date   Arthritis    osteoarthritis   Breast cancer (HCC) 11/2021   CAD (coronary artery  disease)    Descending thoracic aortic dissection (HCC)    followed by Dr. 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 Abel Hoe & Dr. Sherene Dilling .   Severe aortic stenosis    Past Surgical History:  Procedure Laterality Date   AXILLARY SENTINEL NODE BIOPSY Left 12/29/2021   Procedure: LEFT AXILLARY SENTINEL NODE BIOPSY;  Surgeon: Enid Harry, MD;  Location: [facility] OR;  Service: General;  Laterality: Left;   BREAST BIOPSY Bilateral 1970's   x2 one a side   BREAST LUMPECTOMY WITH RADIOACTIVE SEED LOCALIZATION Left 12/29/2021   Procedure: BREAST LUMPECTOMY WITH RADIOACTIVE SEED LOCALIZATION X2;  Surgeon: Enid Harry, MD;  Location: [facility] OR;  Service: General;  Laterality: Left;   CATARACT EXTRACTION     bilateral   implantable loop recorder removal  05/30/2021   MDT reveal LINQ removed by Dr Nunzio Belch   INTRAOPERATIVE TRANSTHORACIC ECHOCARDIOGRAM N/A  08/19/2021   Procedure: INTRAOPERATIVE TRANSTHORACIC ECHOCARDIOGRAM;  Surgeon: Odie Benne, MD;  Location[facility];  Service: Open Heart Surgery;  Laterality: N/A;   LOOP RECORDER INSERTION N/A 08/11/2017   Procedure: LOOP RECORDER INSERTION;  Surgeon: Jolly Needle, MD;  Location: [facility] INVASIVE CV LAB;  Service: Cardiovascular;  Laterality: N/A;   RADIOACTIVE SEED GUIDED AXILLARY SENTINEL LYMPH NODE Left 12/29/2021   Procedure: LEFT AXILLARY NODE SEED GUIDED EXCISION;  Surgeon: Enid Harry, MD;  Location: [facility] OR;  Service: General;  Laterality: Left;    RETINAL DETACHMENT SURGERY  2000   RIGHT/LEFT HEART CATH AND CORONARY ANGIOGRAPHY N/A 06/19/2021   Procedure: RIGHT/LEFT HEART CATH AND CORONARY ANGIOGRAPHY;  Surgeon: Arty Binning, MD;  Location: [facility] INVASIVE CV LAB;  Service: Cardiovascular;  Laterality: N/A;   THYROIDECTOMY  1967   TONSILLECTOMY     TOTAL KNEE ARTHROPLASTY  04/25/2012   Procedure: TOTAL KNEE BILATERAL;  Surgeon: Aurther Blue, MD;  Location: [facility] ORS;  Service: Orthopedics;  Laterality: Bilateral;   TRANSCATHETER AORTIC VALVE REPLACEMENT, TRANSFEMORAL N/A 08/19/2021   Procedure: Transcatheter Aortic Valve Replacement, Transfemoral Using 23mm Sapien 3 Ultra Edwards Valve;  Surgeon: Odie Benne, MD;  Location: [facility] OR;  Service: Open Heart Surgery;  Laterality: N/A;    Current Outpatient Medications  Medication Sig Dispense Refill   ACETAMINOPHEN  PO Take 650 mg by mouth as needed for moderate pain (pain score 4-6).     apixaban  (ELIQUIS ) 5 MG TABS tablet TAKE 1 TABLET(5 MG) BY MOUTH TWICE DAILY 60 tablet 2   b complex vitamins capsule Take 1 capsule by mouth daily.     Bismuth Subsalicylate (KAOPECTATE EXTRA STRENGTH) 525 MG/15ML SUSP 15 mL Orally Four times a day for 30 days As needed for diarrhea     Calcium  Carb-Cholecalciferol  (CALCIUM  + D3 PO) Take 1 tablet by mouth in the morning.     cholecalciferol  (VITAMIN D3) 25 MCG (1000 UNIT) tablet Take 1,000 Units by mouth in the morning.     dapagliflozin  propanediol (FARXIGA ) 10 MG TABS tablet Take 1 tablet (10 mg total) by mouth daily before breakfast. 30 tablet 7   denosumab  (PROLIA ) 60 MG/ML SOSY injection Inject 60 mg into the skin every 6 (six) months.     folic acid  (FOLVITE ) 1 MG tablet Take 2 mg by mouth in the morning.     hydrochlorothiazide  (MICROZIDE ) 12.5 MG capsule Take 12.5 mg by mouth daily.     letrozole  (FEMARA ) 2.5 MG tablet TAKE 1 TABLET(2.5 MG) BY MOUTH DAILY 90 tablet 3   levothyroxine  (SYNTHROID ) 100 MCG tablet Take 100 mcg by mouth daily  before breakfast.     loperamide (IMODIUM) 2 MG capsule Take 2-4 mg by mouth 4 (four) times daily as needed (upset stomach/diarrhea/loose stools).     metoprolol  succinate (TOPROL -XL) 25 MG 24 hr tablet Take 2 tablets (50 mg total) by mouth daily. 90 tablet 3   nitroGLYCERIN  (NITROSTAT ) 0.4 MG SL tablet Place 1 tablet (0.4 mg total) under the tongue every 5 (five) minutes as needed for chest pain. 25 tablet 3   ondansetron  (ZOFRAN -ODT) 4 MG disintegrating tablet Take 4 mg by mouth every 8 (eight) hours as needed.     pantoprazole  (PROTONIX ) 40 MG tablet Take 40 mg by mouth daily.     predniSONE  (DELTASONE ) 5 MG tablet She is on a taper for 1 month Currently taking 10 mg once daily     REPATHA SURECLICK 140 MG/ML SOAJ Inject 140 mg into the skin  every 14 (fourteen) days.     sacubitril-valsartan (ENTRESTO ) 49-51 MG TAKE 1 TABLET BY MOUTH TWICE DAILY 60 tablet 11   zolpidem  (AMBIEN ) 5 MG tablet Take 2.5 mg by mouth at bedtime.     No current facility-administered medications for this encounter.    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 Hives   Bee Venom     Other Reaction(s): red/swelling   ROS- All systems are reviewed and negative except as per the HPI above  Physical Exam: Vitals:   08/13/23 0958  BP: 128/80  Pulse: 60  Weight: 68 kg  Height: 5\' 5"  (1.651 m)    Wt Readings from Last 3 Encounters:  08/13/23 68 kg  07/28/23 68.5 kg  07/05/23 69.4 kg    Labs: Lab Results  Component Value Date   NA 140 07/28/2023   K 3.5 07/28/2023   CL 108 07/28/2023   CO2 20 (L) 07/28/2023   GLUCOSE 102 (H) 07/28/2023   BUN 13 07/28/2023   CREATININE 0.76 07/28/2023   CALCIUM  9.8 07/28/2023   MG 2.1 07/28/2023   Lab Results  Component Value Date   INR 1.4 (H) 06/15/2023   Lab Results  Component Value Date   CHOL 140 02/16/2023   HDL 49  02/16/2023   LDLCALC 66 02/16/2023   TRIG 143 02/16/2023   GEN- The patient is well appearing, alert and oriented x 3 today.   Neck - no JVD or carotid bruit noted Lungs- Clear to ausculation bilaterally, normal work of breathing Heart- Regular rate and rhythm, no murmurs, rubs or gallops, PMI not laterally displaced Extremities- no clubbing, cyanosis, or edema Skin - no rash or ecchymosis noted   EKG-  Vent. rate 60 BPM PR interval 190 ms QRS duration 84 ms QT/QTcB 442/442 ms P-R-T axes 22 -24 39 Normal sinus rhythm Low voltage QRS Cannot rule out Anteroseptal infarct , age undetermined Abnormal ECG When compared with ECG of 28-Jul-2023 14:55, PREVIOUS ECG IS PRESENT  ECHO 08/19/22:  1. Left ventricular ejection fraction, by estimation, is 60 to 65%. The  left ventricle has normal function. The left ventricle has no regional  wall motion abnormalities. Left ventricular diastolic parameters are  indeterminate.   2. Right ventricular systolic function is normal. The right ventricular  size is normal. There is normal pulmonary artery systolic pressure. The  estimated right ventricular systolic pressure is 24.2 mmHg.   3. The mitral valve is normal in structure. Trivial mitral valve  regurgitation.   4. The aortic valve has been repaired/replaced. Aortic valve  regurgitation is not visualized. There is a 23 mm Edwards Sapien  prosthetic (TAVR) valve present in the aortic position. Echo findings are  consistent with normal structure and function of the  aortic valve prosthesis. Aortic valve mean gradient measures 12.0 mmHg.   5. Aortic dilatation noted. There is dilatation of the ascending aorta,  measuring 40 mm.   6. The inferior vena cava is normal in size with greater than 50%  respiratory variability, suggesting right atrial pressure of 3 mmHg.    Assessment and Plan: 1. Paroxysmal atrial fibrillation   She is currently in NSR. Recent ED visit s/p cardioversion on  4/16. Recent ED visit on 3/4 for Afib. We had a long about medication treatments and ablation in detail. We discussed potential options such as Tikosyn and restart of amiodarone .  We talked about the monitoring required for these medications, hospital admission for Tikosyn, and potential adverse effects. We also discussed pulse field ablation as an option in the form of intervention. After discussion, patient is interested in speaking with EP regarding potential for Afib ablation. I did advise patient the discussion would be to determine if EP believes she qualifies as a good candidate for ablation. The EP may decide AAD therapy is preferred over ablation; I advised they will discuss this. Patient will discuss with son if wish to start amiodarone  therapy. If she does wish to begin amiodarone  as a potential bridge to ablation, would begin amiodarone  200 mg BID x 1 month then transition to amiodarone  200 mg once daily. Would require repeat ECG in 2 weeks. May require decrease in metoprolol  to 25 mg daily.   Addendum 08/16/23: Patient started amiodarone  200 mg BID and decreased metoprolol  to 25 mg daily.    2. Secondary hypercoagulable state This patients CHA2DS2-VASc Score and unadjusted Ischemic Stroke Rate (% per year) is equal to 7.2 % stroke rate/year from a score of 5 Continue Eliquis  5 mg BID without interruption.  3. HTN Stable today.  4. Aortic stenosis S/p TAVR.    Will refer to EP to discuss possible ablation. Will need repeat ECG in 2 weeks if decides to start amiodarone .   Woody Heading, PA-C Afib Clinic [facility] [address] [place], [place] [address] [phone]

## 2023-08-16 NOTE — Addendum Note (Signed)
 Encounter addended by: Nathanel Bal, PA-C on: 08/16/2023 11:06 AM  Actions taken: Clinical Note Signed

## 2023-08-17 ENCOUNTER — Other Ambulatory Visit (HOSPITAL_COMMUNITY): Payer: Self-pay | Admitting: Internal Medicine

## 2023-08-19 ENCOUNTER — Ambulatory Visit: Attending: Internal Medicine

## 2023-08-19 DIAGNOSIS — R279 Unspecified lack of coordination: Secondary | ICD-10-CM | POA: Insufficient documentation

## 2023-08-19 DIAGNOSIS — R293 Abnormal posture: Secondary | ICD-10-CM | POA: Insufficient documentation

## 2023-08-19 DIAGNOSIS — M6281 Muscle weakness (generalized): Secondary | ICD-10-CM | POA: Diagnosis not present

## 2023-08-19 NOTE — Therapy (Signed)
 OUTPATIENT PHYSICAL THERAPY FEMALE PELVIC EVALUATION   Patient Name: Marissa Powers MRN: [id number] DOB:July 22, 1940, 83 y.o., female Today's Date: 08/19/2023  END OF SESSION:  PT End of Session - 08/19/23 1404     Visit Number 2    Date for PT Re-Evaluation 10/12/23    Authorization Type HTA    Progress Note Due on Visit 10    PT Start Time 1400    PT Stop Time 1440    PT Time Calculation (min) 40 min    Activity Tolerance Patient tolerated treatment well    Behavior During Therapy [facility] for tasks assessed/performed              Past Medical History:  Diagnosis Date   Arthritis    osteoarthritis   Breast cancer (HCC) 11/2021   CAD (coronary artery disease)    Descending thoracic aortic dissection (HCC)    followed by Dr. 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 Abel Hoe & Dr. Sherene Dilling .   Severe aortic stenosis    Past Surgical History:  Procedure Laterality Date   AXILLARY SENTINEL NODE BIOPSY Left 12/29/2021   Procedure: LEFT AXILLARY SENTINEL NODE BIOPSY;  Surgeon: Enid Harry, MD;  Location: [facility] OR;  Service: General;  Laterality: Left;   BREAST BIOPSY Bilateral 1970's   x2 one a side   BREAST LUMPECTOMY WITH RADIOACTIVE SEED LOCALIZATION Left 12/29/2021   Procedure: BREAST LUMPECTOMY WITH RADIOACTIVE SEED LOCALIZATION X2;  Surgeon: Enid Harry, MD;  Location: [facility] OR;  Service: General;  Laterality: Left;   CATARACT EXTRACTION     bilateral   implantable loop recorder removal   05/30/2021   MDT reveal LINQ removed by Dr Nunzio Belch   INTRAOPERATIVE TRANSTHORACIC ECHOCARDIOGRAM N/A 08/19/2021   Procedure: INTRAOPERATIVE TRANSTHORACIC ECHOCARDIOGRAM;  Surgeon: Odie Benne, MD;  Location: [facility] OR;  Service: Open Heart Surgery;  Laterality: N/A;   LOOP RECORDER INSERTION N/A 08/11/2017   Procedure: LOOP RECORDER INSERTION;  Surgeon: Jolly Needle, MD;  Location: [facility] INVASIVE CV LAB;  Service: Cardiovascular;  Laterality: N/A;   RADIOACTIVE SEED GUIDED AXILLARY SENTINEL LYMPH NODE Left 12/29/2021   Procedure: LEFT AXILLARY NODE SEED GUIDED EXCISION;  Surgeon: Enid Harry, MD;  Location: [facility] OR;  Service: General;  Laterality: Left;   RETINAL DETACHMENT SURGERY  2000   RIGHT/LEFT HEART CATH AND CORONARY ANGIOGRAPHY N/A 06/19/2021   Procedure: RIGHT/LEFT HEART CATH AND CORONARY ANGIOGRAPHY;  Surgeon: Arty Binning, MD;  Location: [facility] INV[facility];  Service: Cardiovascular;  Laterality: N/A;   THYROIDECTOMY  1967   TONSILLECTOMY     TOTAL KNEE ARTHROPLASTY  04/25/2012   Procedure: TOTAL KNEE BILATERAL;  Surgeon: Aurther Blue, MD;  Location: [facility] ORS;  Service: Orthopedics;  Laterality: Bilateral;   TRANSCATHETER AORTIC VALVE REPLACEMENT, TRANSFEMORAL N/A 08/19/2021   Procedure: Transcatheter Aortic Valve Replacement, Transfemoral Using 23mm Sapien 3 Ultra Edwards Valve;  Surgeon: Abel Hoe,  Coy Ditty, MD;  Location: [facility] OR;  Service: Open Heart Surgery;  Laterality: N/A;   Patient Active Problem List   Diagnosis Date Noted   Paroxysmal supraventricular tachycardia (HCC) 06/23/2022   Irritable bowel syndrome 06/23/2022   Giant cell arteritis (HCC) 06/23/2022   Cerebral atherosclerosis 06/23/2022   Acquired hypothyroidism 06/23/2022   Benign essential hypertension 06/23/2022   Sigmoid diverticulitis 10/18/2021   Genetic testing 09/12/2021   Family history of breast cancer 08/28/2021   Polymyalgia rheumatica (HCC) 08/19/2021   S/P TAVR (transcatheter aortic  valve replacement) 08/19/2021   Senile osteoporosis 08/15/2021   Malignant neoplasm of lower-outer quadrant of left breast of female, estrogen receptor positive (HCC) 07/28/2021   Severe aortic stenosis 06/19/2021   Secondary hypercoagulable state (HCC) 08/28/2019   Coronary artery disease of native artery of native heart with stable angina pectoris (HCC) 09/15/2018   Paroxysmal atrial fibrillation (HCC) 10/04/2017   Lichen sclerosus 04/02/2016    Class: History of   Hypertension    GERD (gastroesophageal reflux disease)    Arthritis    Hypercholesteremia    OA (osteoarthritis) of knee 04/25/2012   Fibroid 2002   Dysplasia of cervix, low grade (CIN 1) 1990    PCP: Aldo Hun, MD  REFERRING PROVIDER: Daina Drum, MD   REFERRING DIAG: R19.7 (ICD-10-CM) - Diarrhea, unspecified type R11.0 (ICD-10-CM) - Nausea without vomiting K21.9 (ICD-10-CM) - Gastroesophageal reflux disease, unspecified whether esophagitis present R10.30 (ICD-10-CM) - Lower abdominal pain  THERAPY DIAG:  Muscle weakness (generalized)  Unspecified lack of coordination  Abnormal posture  Rationale for Evaluation and Treatment: Rehabilitation  ONSET DATE: chronic  SUBJECTIVE:                                                                                                                                                                                           SUBJECTIVE STATEMENT: Pt states that she has been having abdominal issues intermittently for a very long time. She states that her main problem cramping, diarrhea/constipation. She feels like her diet does not impact this at all. She is not taking any fiber.  Fluid intake: She is trying to drink a lot of water, but does not get 64oz  PAIN:  Are you having pain? No   PRECAUTIONS: Other: Hx breast cancer 2023 and currently on medication  RED FLAGS: None   WEIGHT BEARING RESTRICTIONS: No  FALLS:  Has patient fallen in last 6 months?  No  OCCUPATION: not working  ACTIVITY LEVEL : walking, trainer 1x/week  PLOF: Independent  PATIENT GOALS: pt would like to have less gas, more consistent bowel movements with complete emptying  PERTINENT  HISTORY:  Diverticulitis, hemorrhoids, hx breast cancer 2023, breast lumpectomy, thyroidectomy, bil TKA, hyperthyroidism, polymyalgia rheumatica, osteoarthritis, lichens sclerosis    BOWEL MOVEMENT: Pain with bowel movement: No Type of bowel movement:Frequency 1x and Strain yes - when stools are mushy they are hard to get out completely Fully empty rectum: Yes: usually Leakage: No Pads: No Fiber supplement/laxative No  URINATION: Pain with urination: No Fully empty bladder: 90% of the time  Stream: Strong Urgency: No Frequency: WNL Leakage: none Pads: No  INTERCOURSE: Not sexually active for long time  PREGNANCY: Vaginal deliveries 2 Tearing No Episiotomy No C-section deliveries 0 Currently pregnant No  PROLAPSE: None   OBJECTIVE:  Note: Objective measures were completed at Evaluation unless otherwise noted.  07/20/23:  PATIENT SURVEYS:   PFIQ-7: 17  COGNITION: Overall cognitive status: Within functional limits for tasks assessed     SENSATION: Light touch: Appears intact  FUNCTIONAL TESTS:  Squat: WNL Single leg stance:  Rt: pelvic drop  Lt: pelvic drop Curl-up test: significant abdominal distortion throughout midline    GAIT: Assistive device utilized: None Comments: WNL  POSTURE: rounded shoulders, forward head, decreased lumbar lordosis, increased thoracic kyphosis, posterior pelvic tilt, and very mild Lt thoracic curvature, Rt posterior pelvic rotation    LUMBARAROM/PROM:  A/PROM A/PROM  Eval (% available)  Flexion 75  Extension 50  Right lateral flexion 50  Left lateral flexion 50  Right rotation 50  Left rotation 50   (Blank rows = not tested)   PALPATION:  Pelvic Alignment: Rt posterior rotation   Abdominal: no  tenderness or restriction noted today                External Perineal Exam: lichens plaques, fragile tissue, phimosis, labial fusion, decreased vulvar mobility                             Internal Pelvic Floor: atrophy; poor generation of intra-rectal pressure with push during rectal exam   Patient confirms identification and approves PT to assess internal pelvic floor and treatment Yes  PELVIC MMT:   MMT eval  Vaginal None initially; with training 1/5, 6 second hold, and 3 repeat contractions   Internal Anal Sphincter 1/5  External Anal Sphincter 1/5  Puborectalis 1/5  Diastasis Recti 1 finger width  (Blank rows = not tested)        TONE: low  PROLAPSE: Grade 1 anterior/grade 2 posterior vaginal wall laxity  TODAY'S TREATMENT:                                                                                                                              DATE:  08/19/23 Manual: Supine bowel mobilization Exercises: Supine bent knee fall out 10x bil Lower trunk rotation 3 x 10 Single knee to chest 10x bil Open books 10x bil Bridge 2 x 10 Therapeutic activities: Strict bowel retraining protocol Increasing water/fiber intake Increasing exercise  working towards 150 minutes of moderate intensity exercise each week   07/20/23  EVAL  Neuromuscular re-education: Pt provides verbal consent for internal vaginal/rectal pelvic floor exam. Internal vaginal and rectal pelvic floor muscle contraction training for quick flicks with breath coordination  Therapeutic activities: Squatty potty/relaxed toilet mechanics Exhale with pushing Peri bottle and water wipes to help clean Bowel massage    PATIENT EDUCATION:  Education details: See above  Person educated: Patient Education method: Explanation, Demonstration, Tactile cues, Verbal cues, and Handouts Education comprehension: verbalized understanding  HOME EXERCISE PROGRAM: [id number]  ASSESSMENT:  CLINICAL IMPRESSION: Pt  seeing some improvement with use of squatty potty, but that has been the only thing she has been able to implement. We went over strict bowel retraining in order to help improve complete emptying and bowel movement schedule. We discussed at length how stool moves through colon and how constipation impacts this; however, time and getting stool moving consistently can help restore colon to it's normal state and allow for less abdominal discomfort. Good tolerance to bowel mobilization and she states that she feels more comfortable working on this at home. She was educated in splinting techniques that should help due to posterior vaginal wall laxity. She did well with all exercises to help improve moiblity surrounding pelvis. She may continue to benefit from skilled PT intervention in order to improve pelvic floor muscle strength, improve complete bowel emptying, optimize push mechanics, begin functional strengthening progrma.   OBJECTIVE IMPAIRMENTS: decreased activity tolerance, decreased coordination, decreased endurance, decreased mobility, decreased strength, increased fascial restrictions, increased muscle spasms, impaired tone, postural dysfunction, and pain.   ACTIVITY LIMITATIONS: bowel movements   PARTICIPATION LIMITATIONS: community activity  PERSONAL FACTORS: 3+ comorbidities: medical history are also affecting patient's functional outcome.   REHAB POTENTIAL: Good  CLINICAL DECISION MAKING: Evolving/moderate complexity  EVALUATION COMPLEXITY: Moderate   GOALS: Goals reviewed with patient? Yes  SHORT TERM GOALS: Target date: 08/17/2023 - updated 08/19/23   Pt will be independent with HEP.   Baseline: Goal status: IN PROGRESS 08/19/23  2.  Pt will be independent with use of squatty potty, relaxed toileting mechanics, and improved bowel movement techniques in order to increase ease of bowel movements and complete evacuation.   Baseline:  Goal status: IN PROGRESS 08/19/23  3.  Pt will be  able to generate improved intra-rectal pressure to help with complete evacuation of bowel movement.  Baseline:  Goal status: IN PROGRESS 08/19/23  4.  Pt will report 25% improvement in symptoms.  Baseline:  Goal status: IN PROGRESS 08/19/23   LONG TERM GOALS: Target date: 10/12/23  Pt will be independent with advanced HEP.   Baseline:  Goal status: IN PROGRESS 08/19/23  2.  Pt will report 75% improvement in symptoms.  Baseline:  Goal status: IN PROGRESS 08/19/23  3.  Pt will be able to perform curl-up test without abdominal distortion.  Baseline:  Goal status: IN PROGRESS 08/19/23  4.  Pt will be able to perform single leg stance without pelvic drop to demonstrate improved core facilitation.  Baseline:  Goal status: IN PROGRESS 08/19/23  5.  Pt will demonstrate normal pelvic floor muscle tone and A/ROM, able to achieve 3/5 strength with contractions and 10 sec endurance, in order to provide appropriate lumbopelvic support in functional activities.   Baseline:  Goal status: IN PROGRESS 08/19/23  6.  Pt will demonstrate <25 on PFIQ-7 to demonstrate decreased functional impact of bowel dysfunction.  Baseline:  Goal status: IN PROGRESS 08/19/23  PLAN:  PT FREQUENCY: 1-2x/week  PT DURATION: 12 weeks  PLANNED INTERVENTIONS: 97110-Therapeutic exercises, 97530- Therapeutic activity, 97112- Neuromuscular re-education, 97535- Self Care, [id number]- Manual therapy, Dry Needling, and Biofeedback  PLAN FOR NEXT SESSION: bowel mobilization; stretches; begin core training    Verlena Glenn, PT, DPT05/08/252:47 PM

## 2023-08-19 NOTE — Patient Instructions (Signed)
 Bowel retraining:  8-12 grams of fiber with each meal After each meal, go attempt to have a bowel movement within 5 minutes and don't sit on the toilet for more than 5 minutes Drink 4-6oz an hour (stop 3 hours before bed) Work on getting 150 minutes of moderate intensity exercise in each week Try to eat something first thing in the morning

## 2023-08-23 ENCOUNTER — Ambulatory Visit

## 2023-08-23 DIAGNOSIS — R279 Unspecified lack of coordination: Secondary | ICD-10-CM

## 2023-08-23 DIAGNOSIS — M6281 Muscle weakness (generalized): Secondary | ICD-10-CM | POA: Diagnosis not present

## 2023-08-23 DIAGNOSIS — R293 Abnormal posture: Secondary | ICD-10-CM

## 2023-08-23 NOTE — Therapy (Signed)
 OUTPATIENT PHYSICAL THERAPY FEMALE PELVIC EVALUATION   Patient Name: Marissa Powers MRN: [id number] DOB:1940-07-27, 83 y.o., female Today's Date: 08/23/2023  END OF SESSION:  PT End of Session - 08/23/23 1013     Visit Number 3    Date for PT Re-Evaluation 10/12/23    Authorization Type HTA    Progress Note Due on Visit 10    PT Start Time 1015    PT Stop Time 1055    PT Time Calculation (min) 40 min    Activity Tolerance Patient tolerated treatment well    Behavior During Therapy [facility] for tasks assessed/performed              Past Medical History:  Diagnosis Date   Arthritis    osteoarthritis   Breast cancer (HCC) 11/2021   CAD (coronary artery disease)    Descending thoracic aortic dissection (HCC)    followed by Dr. 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 Abel Hoe & Dr. Sherene Dilling .   Severe aortic stenosis    Past Surgical History:  Procedure Laterality Date   AXILLARY SENTINEL NODE BIOPSY Left 12/29/2021   Procedure: LEFT AXILLARY SENTINEL NODE BIOPSY;  Surgeon: Enid Harry, MD;  Location: [facility] OR;  Service: General;  Laterality: Left;   BREAST BIOPSY Bilateral 1970's   x2 one a side   BREAST LUMPECTOMY WITH RADIOACTIVE SEED LOCALIZATION Left 12/29/2021   Procedure: BREAST LUMPECTOMY WITH RADIOACTIVE SEED LOCALIZATION X2;  Surgeon: Enid Harry, MD;  Location: [facility] OR;  Service: General;  Laterality: Left;   CATARACT EXTRACTION     bilateral   implantable loop recorder  removal  05/30/2021   MDT reveal LINQ removed by Dr Nunzio Belch   INTRAOPERATIVE TRANSTHORACIC ECHOCARDIOGRAM N/A 08/19/2021   Procedure: INTRAOPERATIVE TRANSTHORACIC ECHOCARDIOGRAM;  Surgeon: Odie Benne, MD;  Location: [facility] OR;  Service: Open Heart Surgery;  Laterality: N/A;   LOOP RECORDER INSERTION N/A 08/11/2017   Procedure: LOOP RECORDER INSERTION;  Surgeon: Jolly Needle, MD;  Location: [facility] INVASIVE CV LAB;  Service: Cardiovascular;  Laterality: N/A;   RADIOACTIVE SEED GUIDED AXILLARY SENTINEL LYMPH NODE Left 12/29/2021   Procedure: LEFT AXILLARY NODE SEED GUIDED EXCISION;  Surgeon: Enid Harry, MD;  Location: [facility] OR;  Service: General;  Laterality: Left;   RETINAL DETACHMENT SURGERY  2000   RIGHT/LEFT HEART CATH AND CORONARY ANGIOGRAPHY N/A 06/19/2021   Procedure: RIGHT/LEFT HEART CATH AND CORONARY ANGIOGRAPHY;  Surgeon: Arty Binning, MD;  Location: [facility] INVASIVE CV LAB;  Service: Cardiovascular;  Laterality: N/A;   THYROIDECTOMY  1967   TONSILLECTOMY     TOTAL KNEE ARTHROPLASTY  04/25/2012   Procedure: TOTAL KNEE BILATERAL;  Surgeon: Aurther Blue, MD;  Location: [facility] ORS;  Service: Orthopedics;  Laterality: Bilateral;   TRANSCATHETER AORTIC VALVE REPLACEMENT, TRANSFEMORAL N/A 08/19/2021   Procedure: Transcatheter Aortic Valve Replacement, Transfemoral Using 23mm Sapien 3 Ultra Edwards Valve;  Surgeon: Abel Hoe,  Coy Ditty, MD;  Location: [facility] OR;  Service: Open Heart Surgery;  Laterality: N/A;   Patient Active Problem List   Diagnosis Date Noted   Paroxysmal supraventricular tachycardia (HCC) 06/23/2022   Irritable bowel syndrome 06/23/2022   Giant cell arteritis (HCC) 06/23/2022   Cerebral atherosclerosis 06/23/2022   Acquired hypothyroidism 06/23/2022   Benign essential hypertension 06/23/2022   Sigmoid diverticulitis 10/18/2021   Genetic testing 09/12/2021   Family history of breast cancer 08/28/2021   Polymyalgia rheumatica (HCC) 08/19/2021   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/19/2021   Senile osteoporosis 08/15/2021   Malignant neoplasm of lower-outer quadrant of left breast of female, estrogen receptor positive (HCC) 07/28/2021   Severe aortic stenosis 06/19/2021   Secondary hypercoagulable state (HCC) 08/28/2019   Coronary artery disease of native artery of native heart with stable angina pectoris (HCC) 09/15/2018   Paroxysmal atrial fibrillation (HCC) 10/04/2017   Lichen sclerosus 04/02/2016    Class: History of   Hypertension    GERD (gastroesophageal reflux disease)    Arthritis    Hypercholesteremia    OA (osteoarthritis) of knee 04/25/2012   Fibroid 2002   Dysplasia of cervix, low grade (CIN 1) 1990    PCP: Aldo Hun, MD  REFERRING PROVIDER: Daina Drum, MD   REFERRING DIAG: R19.7 (ICD-10-CM) - Diarrhea, unspecified type R11.0 (ICD-10-CM) - Nausea without vomiting K21.9 (ICD-10-CM) - Gastroesophageal reflux disease, unspecified whether esophagitis present R10.30 (ICD-10-CM) - Lower abdominal pain  THERAPY DIAG:  Muscle weakness (generalized)  Unspecified lack of coordination  Abnormal posture  Rationale for Evaluation and Treatment: Rehabilitation  ONSET DATE: chronic  SUBJECTIVE:                                                                                                                                                                                           SUBJECTIVE STATEMENT: Pt states that she has tried splinting. She is unsure if it is helpful or not. She states that she had 3 bowel movements last week. She has been working on frequent bowel movements. She feels like blaoting nad gas has improved.   PAIN:  Are you having pain? No   PRECAUTIONS: Other: Hx breast cancer 2023 and currently on medication  RED FLAGS: None   WEIGHT BEARING RESTRICTIONS: No  FALLS:  Has patient fallen in last 6 months? No  OCCUPATION: not working  ACTIVITY LEVEL : walking, trainer 1x/week  PLOF:  Independent  PATIENT GOALS: pt would like to have less gas, more consistent bowel movements with complete emptying  PERTINENT HISTORY:  Diverticulitis, hemorrhoids, hx breast cancer 2023, breast lumpectomy, thyroidectomy, bil TKA, hyperthyroidism,  polymyalgia rheumatica, osteoarthritis, lichens sclerosis    BOWEL MOVEMENT: Pain with bowel movement: No Type of bowel movement:Frequency 1x and Strain yes - when stools are mushy they are hard to get out completely Fully empty rectum: Yes: usually Leakage: No Pads: No Fiber supplement/laxative No  URINATION: Pain with urination: No Fully empty bladder: 90% of the time  Stream: Strong Urgency: No Frequency: WNL Leakage: none Pads: No  INTERCOURSE: Not sexually active for long time  PREGNANCY: Vaginal deliveries 2 Tearing No Episiotomy No C-section deliveries 0 Currently pregnant No  PROLAPSE: None   OBJECTIVE:  Note: Objective measures were completed at Evaluation unless otherwise noted.  07/20/23:  PATIENT SURVEYS:   PFIQ-7: 37  COGNITION: Overall cognitive status: Within functional limits for tasks assessed     SENSATION: Light touch: Appears intact  FUNCTIONAL TESTS:  Squat: WNL Single leg stance:  Rt: pelvic drop  Lt: pelvic drop Curl-up test: significant abdominal distortion throughout midline    GAIT: Assistive device utilized: None Comments: WNL  POSTURE: rounded shoulders, forward head, decreased lumbar lordosis, increased thoracic kyphosis, posterior pelvic tilt, and very mild Lt thoracic curvature, Rt posterior pelvic rotation    LUMBARAROM/PROM:  A/PROM A/PROM  Eval (% available)  Flexion 75  Extension 50  Right lateral flexion 50  Left lateral flexion 50  Right rotation 50  Left rotation 50   (Blank rows = not tested)   PALPATION:  Pelvic Alignment: Rt posterior rotation   Abdominal: no tenderness or restriction noted today                External Perineal Exam: lichens  plaques, fragile tissue, phimosis, labial fusion, decreased vulvar mobility                             Internal Pelvic Floor: atrophy; poor generation of intra-rectal pressure with push during rectal exam   Patient confirms identification and approves PT to assess internal pelvic floor and treatment Yes  PELVIC MMT:   MMT eval  Vaginal None initially; with training 1/5, 6 second hold, and 3 repeat contractions   Internal Anal Sphincter 1/5  External Anal Sphincter 1/5  Puborectalis 1/5  Diastasis Recti 1 finger width  (Blank rows = not tested)        TONE: low  PROLAPSE: Grade 1 anterior/grade 2 posterior vaginal wall laxity  TODAY'S TREATMENT:                                                                                                                              DATE:  08/23/23 Manual: Supine bowel mobilization Neuromuscular re-education: Transversus abdominus training with multimodal cues for improved motor control and breath coordination Transversus abdominus isometrics 10x Bil supine UE ball press with transversus abdominus and pelvic floor muscle contractions and breath coordination 10x Supine hip adduction ball press with transversus abdominus and pelvic floor muscle contractions and breath coordination 10x  Bridge with hip adduction, transversus abdominus, and pelvic floor muscle 2 x 10 Seated hip adduction ball press with transversus abdominus and pelvic floor muscle 2 x 10 Seated hip abduction red band with transversus abdominus and pelvic floor muscle 2 x 10 Seated resisted march red band with transversus abdominus and pelvic floor muscle 2 x 10 Squats to table with cues to stay contracted in pelvic floor muscles at 50% 10x Exercises: Seated hamstring stretch 60 seconds bil Seated piriformis stretch 60 sec bil Lower trunk rotation 2 x 10   08/19/23 Manual: Supine bowel mobilization Exercises: Supine bent knee fall out 10x bil Lower trunk rotation 3 x  10 Single knee to chest 10x bil Open books 10x bil Bridge 2 x 10 Therapeutic activities: Strict bowel retraining protocol Increasing water/fiber intake Increasing exercise working towards 150 minutes of moderate intensity exercise each week   07/20/23  EVAL  Neuromuscular re-education: Pt provides verbal consent for internal vaginal/rectal pelvic floor exam. Internal vaginal and rectal pelvic floor muscle contraction training for quick flicks with breath coordination  Therapeutic activities: Squatty potty/relaxed toilet mechanics Exhale with pushing Peri bottle and water wipes to help clean Bowel massage    PATIENT EDUCATION:  Education details: See above  Person educated: Patient Education method: Programmer, multimedia, Demonstration, Tactile cues, Verbal cues, and Handouts Education comprehension: verbalized understanding  HOME EXERCISE PROGRAM: [id number]  ASSESSMENT:  CLINICAL IMPRESSION: Pt is doing well with more regular bowel movements, no straining, and decreased lower abdominal discomfort. We continued bowel mobilization because it seems to be very helpful in increasing gut motility and she has improved gut sounds. Good tolerance today. She was able to begin core strengthening in order to further improve mobility and strength and progressions with excellent tolerance and coordination. HEP updated. She may continue to benefit from skilled PT intervention in order to improve pelvic floor muscle strength, improve complete bowel emptying, optimize push mechanics, begin functional strengthening progrma.   OBJECTIVE IMPAIRMENTS: decreased activity tolerance, decreased coordination, decreased endurance, decreased mobility, decreased strength, increased fascial restrictions, increased muscle spasms, impaired tone, postural dysfunction, and pain.   ACTIVITY LIMITATIONS: bowel movements   PARTICIPATION LIMITATIONS: community activity  PERSONAL FACTORS: 3+ comorbidities: medical history  are also affecting patient's functional outcome.   REHAB POTENTIAL: Good  CLINICAL DECISION MAKING: Evolving/moderate complexity  EVALUATION COMPLEXITY: Moderate   GOALS: Goals reviewed with patient? Yes  SHORT TERM GOALS: Target date: 08/17/2023 - updated 08/19/23   Pt will be independent with HEP.   Baseline: Goal status: IN PROGRESS 08/19/23  2.  Pt will be independent with use of squatty potty, relaxed toileting mechanics, and improved bowel movement techniques in order to increase ease of bowel movements and complete evacuation.   Baseline:  Goal status: IN PROGRESS 08/19/23  3.  Pt will be able to generate improved intra-rectal pressure to help with complete evacuation of bowel movement.  Baseline:  Goal status: IN PROGRESS 08/19/23  4.  Pt will report 25% improvement in symptoms.  Baseline:  Goal status: IN PROGRESS 08/19/23   LONG TERM GOALS: Target date: 10/12/23  Pt will be independent with advanced HEP.   Baseline:  Goal status: IN PROGRESS 08/19/23  2.  Pt will report 75% improvement in symptoms.  Baseline:  Goal status: IN PROGRESS 08/19/23  3.  Pt will be able to perform curl-up test without abdominal distortion.  Baseline:  Goal status: IN PROGRESS 08/19/23  4.  Pt will be able to perform single leg stance without pelvic drop  to demonstrate improved core facilitation.  Baseline:  Goal status: IN PROGRESS 08/19/23  5.  Pt will demonstrate normal pelvic floor muscle tone and A/ROM, able to achieve 3/5 strength with contractions and 10 sec endurance, in order to provide appropriate lumbopelvic support in functional activities.   Baseline:  Goal status: IN PROGRESS 08/19/23  6.  Pt will demonstrate <25 on PFIQ-7 to demonstrate decreased functional impact of bowel dysfunction.  Baseline:  Goal status: IN PROGRESS 08/19/23  PLAN:  PT FREQUENCY: 1-2x/week  PT DURATION: 12 weeks  PLANNED INTERVENTIONS: 97110-Therapeutic exercises, 97530- Therapeutic activity, 97112-  Neuromuscular re-education, 97535- Self Care, 16109- Manual therapy, Dry Needling, and Biofeedback  PLAN FOR NEXT SESSION: bowel mobilization; stretches; begin core training    Verlena Glenn, PT, DPT05/03/2509:57 AM

## 2023-08-24 ENCOUNTER — Ambulatory Visit: Attending: Cardiology | Admitting: Cardiology

## 2023-08-24 ENCOUNTER — Encounter: Payer: Self-pay | Admitting: Cardiology

## 2023-08-24 ENCOUNTER — Telehealth: Payer: Self-pay

## 2023-08-24 VITALS — BP 148/92 | HR 56 | Ht 65.0 in | Wt 145.0 lb

## 2023-08-24 DIAGNOSIS — Z952 Presence of prosthetic heart valve: Secondary | ICD-10-CM

## 2023-08-24 DIAGNOSIS — I10 Essential (primary) hypertension: Secondary | ICD-10-CM

## 2023-08-24 DIAGNOSIS — I480 Paroxysmal atrial fibrillation: Secondary | ICD-10-CM | POA: Diagnosis not present

## 2023-08-24 DIAGNOSIS — D6869 Other thrombophilia: Secondary | ICD-10-CM | POA: Diagnosis not present

## 2023-08-24 NOTE — Progress Notes (Signed)
 Electrophysiology Office Note:   Date:  08/24/2023  ID:  Cynai K Mcculley, DOB 10-27-1940, MRN [id number]  Primary Cardiologist: Sheryle Donning, MD Primary Heart Failure: None Electrophysiologist: Abrahm Mancia Cortland Ding, MD      History of Present Illness:   Marissa Powers is a 83 y.o. female with h/o hypertension, hypothyroidism, coronary artery disease, atrial fibrillation seen today for  for Electrophysiology evaluation of atrial fibrillation at the request of Minnie Amber.    She saw her primary physician for annual physical and noted a rapid heartbeat in the middle of the night.  Her heart rate was in the 100-120 range.  The episode lasted for around 4 hours.  4 to 6 weeks prior to this, she had similar symptoms.  She presented to emergency room 07/28/2023 for atrial fibrillation.  She had a cardioversion at that time.  Today, denies symptoms of palpitations, chest pain, shortness of breath, orthopnea, PND, lower extremity edema, claudication, dizziness, presyncope, syncope, bleeding, or neurologic sequela. The patient is tolerating medications without difficulties.  She feels well today.  She has been started on amiodarone .  She has had no further episodes of atrial fibrillation.  When she is in atrial fibrillation she feels weak and fatigued.  When she is in normal rhythm she can do all of her daily activities without restriction.   Review of systems complete and found to be negative unless listed in HPI.   EP Information / Studies Reviewed:    EKG is not ordered today. EKG from 08/13/23 reviewed which showed sinus rhythm        Risk Assessment/Calculations:    CHA2DS2-VASc Score = 6   This indicates a 9.7% annual risk of stroke. The patient's score is based upon: CHF History: 1 HTN History: 1 Diabetes History: 0 Stroke History: 0 Vascular Disease History: 1 Age Score: 2 Gender Score: 1            Physical Exam:   VS:  BP (!) 148/92   Pulse (!) 56   Ht 5\' 5"   (1.651 m)   Wt 145 lb (65.8 kg)   SpO2 97%   BMI 24.13 kg/m    Wt Readings from Last 3 Encounters:  08/24/23 145 lb (65.8 kg)  08/13/23 150 lb (68 kg)  07/28/23 151 lb (68.5 kg)     GEN: Well nourished, well developed in no acute distress NECK: No JVD; No carotid bruits CARDIAC: Regular rate and rhythm, no murmurs, rubs, gallops RESPIRATORY:  Clear to auscultation without rales, wheezing or rhonchi  ABDOMEN: Soft, non-tender, non-distended EXTREMITIES:  No edema; No deformity   ASSESSMENT AND PLAN:    1.  Paroxysmal atrial fibrillation: On amiodarone .  She is in sinus rhythm.  She has had no further episodes of atrial fibrillation.  Despite this, she would prefer amiodarone  to be a short-term medication.  Due to that, we Arantza Darrington plan for ablation.  Risk, benefits, and alternatives to EP study and radiofrequency/pulse field ablation for afib were also discussed in detail today. These risks include but are not limited to stroke, bleeding, vascular damage, tamponade, perforation, damage to the esophagus, lungs, and other structures, pulmonary vein stenosis, worsening renal function, and death. The patient understands these risk and wishes to proceed.  We Ashika Apuzzo therefore proceed with catheter ablation at the next available time.  Carto, ICE, anesthesia are requested for the procedure.  Dollie Bressi also obtain CT PV protocol prior to the procedure to exclude LAA thrombus and further evaluate atrial anatomy.  2.  Secondary hypercoagulable state: On Eliquis  for atrial fibrillation  3.  Hypertension: Mildly elevated today.  Followed by general cardiology and primary physician  4.  Aortic stenosis: Post TAVR  Follow up with Afib Clinic as usual post procedure  Signed, Nicolet Griffy Cortland Ding, MD

## 2023-08-24 NOTE — Telephone Encounter (Signed)
LM for pt to call back to schedule Afib Ablation with Dr. Elberta Fortis.

## 2023-08-24 NOTE — Patient Instructions (Signed)
   Testing/Procedures:  A FIB ABLATION-AUGUST  Follow-Up: At [facility], you and your health needs are our priority.  As part of our continuing mission to provide you with exceptional heart care, our providers are all part of one team.  This team includes your primary Cardiologist (physician) and Advanced Practice Providers or APPs (Physician Assistants and Nurse Practitioners) who all work together to provide you with the care you need, when you need it.  Your next appointment:   TO BE DETERMINED

## 2023-08-30 ENCOUNTER — Ambulatory Visit (HOSPITAL_COMMUNITY)
Admission: RE | Admit: 2023-08-30 | Discharge: 2023-08-30 | Disposition: A | Discharge status: Home / Self Care | Source: Ambulatory Visit | Attending: Internal Medicine | Admitting: Internal Medicine

## 2023-08-30 VITALS — HR 54

## 2023-08-30 DIAGNOSIS — I4891 Unspecified atrial fibrillation: Secondary | ICD-10-CM | POA: Diagnosis not present

## 2023-08-30 NOTE — Patient Instructions (Signed)
 Amiodarone  200mg  once a day on June 2nd

## 2023-08-30 NOTE — Progress Notes (Signed)
 Patient began amiodarone  200 mg BID on 5/5 and is here for repeat ECG. She decreased metoprolol  to 25 mg daily due to bradycardia.   ECG today shows Vent. rate 54 BPM PR interval 194 ms QRS duration 86 ms QT/QTcB 448/424 ms P-R-T axes 27 -33 27 Sinus bradycardia with Premature supraventricular complexes Left axis deviation Anteroseptal infarct (cited on or before 13-Aug-2023) Abnormal ECG When compared with ECG of 13-Aug-2023 10:12, Premature supraventricular complexes are now Present   Transition to amiodarone  200 mg once daily on 6/2. Patient will follow up for imaging prior to ablation.

## 2023-08-31 ENCOUNTER — Other Ambulatory Visit: Payer: Self-pay

## 2023-08-31 DIAGNOSIS — I4891 Unspecified atrial fibrillation: Secondary | ICD-10-CM

## 2023-08-31 NOTE — Telephone Encounter (Signed)
 Patient is scheduled for Afib ablation with Dr. Lawana Pray on 12/17/23 @ 10am.  She is scheduled for CT on 11/19/23 @ 11 and will have labs done same day.  Instruction letters will be sent through MyChart once completed.

## 2023-09-01 ENCOUNTER — Telehealth: Payer: Self-pay

## 2023-09-01 ENCOUNTER — Ambulatory Visit (INDEPENDENT_AMBULATORY_CARE_PROVIDER_SITE_OTHER)
Admission: RE | Admit: 2023-09-01 | Discharge: 2023-09-01 | Disposition: A | Discharge status: Home / Self Care | Source: Ambulatory Visit | Attending: Internal Medicine | Admitting: Internal Medicine

## 2023-09-01 ENCOUNTER — Encounter: Payer: Self-pay | Admitting: Internal Medicine

## 2023-09-01 ENCOUNTER — Encounter: Payer: Self-pay | Admitting: Cardiology

## 2023-09-01 ENCOUNTER — Ambulatory Visit: Payer: PPO | Admitting: Internal Medicine

## 2023-09-01 VITALS — BP 118/62 | HR 57 | Ht 65.0 in | Wt 150.0 lb

## 2023-09-01 DIAGNOSIS — R110 Nausea: Secondary | ICD-10-CM

## 2023-09-01 DIAGNOSIS — R1030 Lower abdominal pain, unspecified: Secondary | ICD-10-CM | POA: Diagnosis not present

## 2023-09-01 DIAGNOSIS — Z170 Estrogen receptor positive status [ER+]: Secondary | ICD-10-CM | POA: Insufficient documentation

## 2023-09-01 DIAGNOSIS — D499 Neoplasm of unspecified behavior of unspecified site: Secondary | ICD-10-CM | POA: Insufficient documentation

## 2023-09-01 DIAGNOSIS — K219 Gastro-esophageal reflux disease without esophagitis: Secondary | ICD-10-CM

## 2023-09-01 DIAGNOSIS — Z1211 Encounter for screening for malignant neoplasm of colon: Secondary | ICD-10-CM

## 2023-09-01 DIAGNOSIS — Z8719 Personal history of other diseases of the digestive system: Secondary | ICD-10-CM

## 2023-09-01 DIAGNOSIS — G453 Amaurosis fugax: Secondary | ICD-10-CM | POA: Insufficient documentation

## 2023-09-01 DIAGNOSIS — I4891 Unspecified atrial fibrillation: Secondary | ICD-10-CM | POA: Diagnosis not present

## 2023-09-01 MED ORDER — SUTAB 1479-225-188 MG PO TABS: ORAL_TABLET | ORAL | 0 refills | Status: DC

## 2023-09-01 NOTE — Patient Instructions (Addendum)
 Your provider has requested that you have an abdominal x ray before leaving today. Please go to the basement floor to our Radiology department for the test.  You have been scheduled for a colonoscopy. Please follow written instructions given to you at your visit today.   If you use inhalers (even only as needed), please bring them with you on the day of your procedure.  DO NOT TAKE 7 DAYS PRIOR TO TEST- Trulicity (dulaglutide) Ozempic, Wegovy (semaglutide) Mounjaro (tirzepatide) Bydureon Bcise (exanatide extended release)  DO NOT TAKE 1 DAY PRIOR TO YOUR TEST Rybelsus (semaglutide) Adlyxin (lixisenatide) Victoza (liraglutide) Byetta (exanatide)  We have sent the following medications to your pharmacy for you to pick up at your convenience: Sutab  Stop taking Pantoprazole     If your blood pressure at your visit was 140/90 or greater, please contact your primary care physician to follow up on this.   If you are age 37 or older, your body mass index should be between 23-30. Your Body mass index is 24.96 kg/m. If this is out of the aforementioned range listed, please consider follow up with your Primary Care Provider.  If you are age 60 or younger, your body mass index should be between 19-25. Your Body mass index is 24.96 kg/m. If this is out of the aformentioned range listed, please consider follow up with your Primary Care Provider.   The [facility] providers would like to encourage you to use MYCHART to communicate with providers for non-urgent requests or questions.  Due to long hold times on the telephone, sending your provider a message by [facility] may be a faster and more efficient way to get a response.  Please allow 48 business hours for a response.  Please remember that this is for non-urgent requests.   Due to recent changes in healthcare laws, you may see the results of your imaging and laboratory studies on MyChart before your provider has had a chance to review them.  We  understand that in some cases there may be results that are confusing or concerning to you. Not all laboratory results come back in the same time frame and the provider may be waiting for multiple results in order to interpret others.  Please give us  48 hours in order for your provider to thoroughly review all the results before contacting the office for clarification of your results.   Thank you for entrusting me with your care and for choosing [facility],  Dr. Regino Caprio

## 2023-09-01 NOTE — Progress Notes (Signed)
 Chief Complaint: Diarrhea, ab pain, and nausea  HPI : 83 year old female with history of A-fib on Eliquis , prior diverticulitis, CAD, GERD, PMR, aortic stenosis s/p TAVR, breast cancer in remission, arthritis presents for follow up of abdominal pain  Last colonoscopy was in her 18s and it was normal.   Interval History: Patient presents with her friend to clinic today. She has been working with a pelvic floor PT and has been trying to be better about doing her exercises. She is still having some discomfort in the lower abdomen.  She brings a list of her GI related medicines and wanted to try to get a better idea of whether or not she should be taking all of these medicines at the same time.  She feels nauseous at times. She has been working on her thyroid  medicines with her PCP. She has 1 BM per day on average. She has to strain when passing a BM. She was told that she had stools felt on massage during her pelvic PT session. She has a lot of gas. Gas-X does seem to help her. She is currently taking pantoprazole  once a day and has not noticed a difference in her symptoms on this medicine. She has not been very good with the low FODMAP diet. She eats plenty of vegetables and doesn't eat a lot of meat. She does eat a lot of carbohydrates, which thinks are easier to her stomach. She eats dairy but this doesn't seem to bother her. She had an episode of diverticulitis a couple of months ago and did do a stool test recently with her PCP that was positive for blood.  Wt Readings from Last 3 Encounters:  09/01/23 150 lb (68 kg)  08/24/23 145 lb (65.8 kg)  08/13/23 150 lb (68 kg)    Past Medical History:  Diagnosis Date   Arthritis    osteoarthritis   Breast cancer (HCC) 11/2021   CAD (coronary artery disease)    Descending thoracic aortic dissection (HCC)    followed by Dr. 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 Abel Hoe & Dr. Sherene Dilling .   Severe aortic stenosis      Past Surgical History:  Procedure Laterality Date   AXILLARY SENTINEL NODE BIOPSY Left 12/29/2021   Procedure: LEFT AXILLARY SENTINEL NODE BIOPSY;  Surgeon: Enid Harry, MD;  Location: [facility] OR;  Service: General;  Laterality: Left;   BREAST BIOPSY Bilateral 1970's   x2 one a side   BREAST LUMPECTOMY WITH RADIOACTIVE SEED LOCALIZATION Left 12/29/2021   Procedure: BREAST LUMPECTOMY WITH RADIOACTIVE SEED LOCALIZATION X2;  Surgeon: Enid Harry, MD;  Location: [facility] OR;  Service: General;  Laterality: Left;   CATARACT EXTRACTION     bilateral   implantable loop recorder removal  05/30/2021   MDT reveal LINQ removed by Dr Nunzio Belch   INTRAOPERATIVE TRANSTHORACIC ECHOCARDIOGRAM N/A 08/19/2021   Procedure: INTRAOPERATIVE TRANSTHORACIC  ECHOCARDIOGRAM;  Surgeon: Odie Benne, MD;  Location: [facility] OR;  Service: Open Heart Surgery;  Laterality: N/A;   LOOP RECORDER INSERTION N/A 08/11/2017   Procedure: LOOP RECORDER INSERTION;  Surgeon: Jolly Needle, MD;  Location: [facility] INVASIVE CV LAB;  Service: Cardiovascular;  Laterality: N/A;   RADIOACTIVE SEED GUIDED AXILLARY SENTINEL LYMPH NODE Left 12/29/2021   Procedure: LEFT AXILLARY NODE SEED GUIDED EXCISION;  Surgeon: Enid Harry, MD;  Location: [facility] OR;  Service: General;  Laterality: Left;   RETINAL DETACHMENT SURGERY  2000   RIGHT/LEFT HEART CATH AND CORONARY ANGIOGRAPHY N/A 06/19/2021   Procedure: RIGHT/LEFT HEART CATH AND CORONARY ANGIOGRAPHY;  Surgeon: Arty Binning, MD;  Location: [facility]  INVASIVE CV LAB;  Service: Cardiovascular;  Laterality: N/A;   THYROIDECTOMY  1967   TONSILLECTOMY     TOTAL KNEE ARTHROPLASTY  04/25/2012   Procedure: TOTAL KNEE BILATERAL;  Surgeon: Aurther Blue, MD;  Location: [facility] ORS;  Service: Orthopedics;  Laterality: Bilateral;   TRANSCATHETER AORTIC VALVE REPLACEMENT, TRANSFEMORAL N/A 08/19/2021   Procedure: Transcatheter Aortic Valve Replacement, Transfemoral Using 23mm Sapien 3 Ultra Edwards Valve;  Surgeon: Odie Benne, MD;  Location: [facility] OR;  Service: Open Heart Surgery;  Laterality: N/A;   Family History  Problem Relation Age of Onset   AAA (abdominal aortic aneurysm) Mother 66   Healthy Father    Heart disease Brother        stints   Breast cancer Maternal Grandmother 69   Heart attack Maternal Grandmother    Breast cancer Maternal Aunt 78   Hypertension Son    Hypertension Son    Social History   Tobacco Use   Smoking status: Never    Passive exposure: Never   Smokeless tobacco: Never  Vaping Use   Vaping status: Never Used  Substance Use Topics   Alcohol  use: Yes    Alcohol /week: 4.0 - 6.0 standard drinks of alcohol     Types: 4 - 6 Standard drinks or equivalent per week    Comment: once a week   Drug use: No   Current Outpatient Medications  Medication Sig Dispense Refill   ACETAMINOPHEN  PO Take 650 mg by mouth as needed for moderate pain (pain score 4-6).     amiodarone  (PACERONE ) 200 MG tablet Take 1 tablet (200 mg total) by mouth 2 (two) times daily for 30 days, THEN 1 tablet (200 mg total) daily. 60 tablet 3   b complex vitamins capsule Take 1 capsule by mouth daily.     Calcium  Carb-Cholecalciferol  (CALCIUM  + D3 PO) Take 1 tablet by mouth in the morning.     cholecalciferol  (VITAMIN D3) 25 MCG (1000 UNIT) tablet Take 1,000 Units by mouth in the morning.     dapagliflozin  propanediol (FARXIGA ) 10 MG TABS tablet Take 1 tablet (10 mg total) by mouth daily before breakfast. 30 tablet 7   denosumab  (PROLIA ) 60  MG/ML SOSY injection Inject 60 mg into the skin every 6 (six) months.     ELIQUIS  5 MG TABS tablet TAKE 1 TABLET(5 MG) BY MOUTH TWICE DAILY 60 tablet 2   folic acid  (FOLVITE ) 1 MG tablet Take 2 mg by mouth in the morning.     hydrochlorothiazide  (MICROZIDE ) 12.5 MG capsule Take 12.5 mg by mouth daily.     letrozole  (FEMARA ) 2.5 MG tablet TAKE 1 TABLET(2.5 MG) BY MOUTH DAILY 90 tablet 3   levothyroxine  (SYNTHROID ) 100 MCG tablet Take 100 mcg by mouth daily before breakfast.     loperamide (  IMODIUM) 2 MG capsule Take 2-4 mg by mouth 4 (four) times daily as needed (upset stomach/diarrhea/loose stools).     metoprolol  succinate (TOPROL -XL) 50 MG 24 hr tablet Take 50 mg by mouth daily.     nitroGLYCERIN  (NITROSTAT ) 0.4 MG SL tablet Place 1 tablet (0.4 mg total) under the tongue every 5 (five) minutes as needed for chest pain. 25 tablet 3   ondansetron  (ZOFRAN -ODT) 4 MG disintegrating tablet Take 4 mg by mouth every 8 (eight) hours as needed.     pantoprazole  (PROTONIX ) 40 MG tablet Take 40 mg by mouth daily.     predniSONE  (DELTASONE ) 5 MG tablet She is on a taper for 1 month Currently taking 10 mg once daily     REPATHA SURECLICK 140 MG/ML SOAJ Inject 140 mg into the skin every 14 (fourteen) days.     sacubitril-valsartan (ENTRESTO ) 49-51 MG TAKE 1 TABLET BY MOUTH TWICE DAILY 60 tablet 11   Sodium Sulfate-Mag Sulfate-KCl (SUTAB) 1479-225-188 MG TABS Use as directed for colonoscopy. MANUFACTURER CODES!! BIN: [id number] PCN: CN GROUP: [id number] MEMBER ID: [id number];[employer] AS SECONDARY INSURANCE ;NO PRIOR AUTHORIZATION 24 tablet 0   zolpidem  (AMBIEN ) 5 MG tablet Take 2.5 mg by mouth at bedtime.     No current facility-administered medications for this visit.   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 HEENT: Normocephalic and atraumatic. Conjunctivae are normal. No scleral icterus. Cardiovascular: Normal rate, regular rhythm.  Pulmonary/chest: Effort normal and breath sounds normal. No wheezing, rales or rhonchi. Abdominal: Soft, nondistended, nontender. Bowel sounds active throughout. There are no masses palpable. No hepatomegaly. Extremities: No edema Neurological: Alert and oriented to person place and time. Skin: Skin is warm and dry. No rashes noted. Psychiatric: Normal mood and affect. Behavior is normal.  Labs 02/2023: CMP nml.  TSH normal.  Labs 06/2023: CRP nml. TTG IgA negative. TSH low at 0.28. Alpha gal negative.  Labs 07/2023: CBC unremarkable. CMP nml.   Labs 06/2023: CBC unremarkable.  BMP unremarkable.  BNP mildly elevated.  INR mildly elevated at 1.4.   CTA A/P w/contrast 11/07/21: IMPRESSION: VASCULAR 1. Small focal dissection of the abdominal aorta which begins at the level of the SMA and terminates above the bilateral renal arteries. 2. See separately dictated same day CT of the chest for discussion of findings above the diaphragm. NON-VASCULAR 1. Mild soft tissue stranding is seen about the sigmoid colon, decreased when compared with prior CT of the abdomen and pelvis, compatible with resolving acute diverticulitis.  ASSESSMENT AND PLAN:  Lower ab pain Nausea GERD History of diverticulitis Patient has been working with pelvic floor physical therapy to try to help make it easier for her to pass BMs.  Her stools are fairly regular but patient does still describe significant lower abdominal discomfort as well as significant bloating issues.  She also has had some nausea that did not benefit from pantoprazole  therapy.  It is possible that constipation could be contributing to her symptoms so we will  obtain an abdominal x-ray to get a better idea of her underlying stool burden.  In the meantime I also asked her to try IBgard to see if this helps with her symptoms.  Patient has had difficulty with following the low FODMAP diet.  Patient revealed to me today that she did have an episode of diverticulitis a couple months ago so we will plan for a colonoscopy since her last colonoscopy was several years ago.  I went over the risks (including the risk of bleeding, perforation, infection, and changes in vitals signs due to the sedation) and benefits of the colonoscopy procedure with the patient and she is agreeable to proceeding. - Previously gave handout on low FODMAP diet - Stop pantoprazole  - KUB - Trial of IBGard  - Continue Gas-X PRN - Colonoscopy [facility] with Sutab. Will hold Eliquis  2 day beforehand. Will reach out to cardiology  Regino Caprio, MD  I spent 36 minutes of time, including in depth chart review, independent review of results as outlined above, communicating results with the patient directly, face-to-face time with the patient, coordinating care, ordering studies and medications as appropriate, and documentation.

## 2023-09-01 NOTE — Telephone Encounter (Signed)
 [facility] Pre-operative Risk Assessment     Request for surgical clearance:     Endoscopy Procedure  What type of surgery is being performed?     Colonoscopy  When is this surgery scheduled?     09/09/23  What type of clearance is required ?   Pharmacy  Are there any medications that need to be held prior to surgery and how long? Eliquis  2 days hold   Practice name and name of physician performing surgery?      [facility]  What is your office phone and fax number?      Phone- [phone]  Fax- [phone]  Anesthesia type (None, local, MAC, general) ?       MAC   Please route your response to [facility]

## 2023-09-01 NOTE — Telephone Encounter (Signed)
   Patient Name: Marissa Powers  DOB: [birth year] MRN: [id number]  Primary Cardiologist: Sheryle Donning, MD  Clinical pharmacists have reviewed the patient's past medical history, labs, and current medications as part of preoperative protocol coverage. The following recommendations have been made:  Patient with diagnosis of A Fib on Eliquis  for anticoagulation.     Procedure: colonoscopy Date of procedure: 09/09/23     CHA2DS2-VASc Score = 6  This indicates a 9.7% annual risk of stroke. The patient's score is based upon: CHF History: 1 HTN History: 1 Diabetes History: 0 Stroke History: 0 Vascular Disease History: 1 Age Score: 2 Gender Score: 1   CrCl 61l/min Platelet count 190K     Per office protocol, patient can hold Eliquis  for 2 days prior to procedure.     I will route this recommendation to the requesting party via Epic fax function and remove from pre-op pool.  Please call with questions.  Friddie Jetty, NP 09/01/2023, 3:51 PM

## 2023-09-01 NOTE — Telephone Encounter (Signed)
 Patient with diagnosis of A Fib on Eliquis  for anticoagulation.    Procedure: colonoscopy Date of procedure: 09/09/23   CHA2DS2-VASc Score = 6  This indicates a 9.7% annual risk of stroke. The patient's score is based upon: CHF History: 1 HTN History: 1 Diabetes History: 0 Stroke History: 0 Vascular Disease History: 1 Age Score: 2 Gender Score: 1   CrCl 61l/min Platelet count 190K   Per office protocol, patient can hold Eliquis  for 2 days prior to procedure.    **This guidance is not considered finalized until pre-operative APP has relayed final recommendations.**

## 2023-09-01 NOTE — Telephone Encounter (Signed)
 Pharmacy please advise on holding Eliquis prior to colonoscopy scheduled for 09/09/2023. Thank you.

## 2023-09-02 ENCOUNTER — Ambulatory Visit (HOSPITAL_COMMUNITY): Payer: Self-pay | Admitting: Internal Medicine

## 2023-09-02 ENCOUNTER — Ambulatory Visit

## 2023-09-02 DIAGNOSIS — R293 Abnormal posture: Secondary | ICD-10-CM

## 2023-09-02 DIAGNOSIS — M6281 Muscle weakness (generalized): Secondary | ICD-10-CM

## 2023-09-02 DIAGNOSIS — R279 Unspecified lack of coordination: Secondary | ICD-10-CM

## 2023-09-02 NOTE — Therapy (Signed)
 OUTPATIENT PHYSICAL THERAPY FEMALE PELVIC EVALUATION   Patient Name: Marissa Powers MRN: [id number] DOB:02/06/1941, 83 y.o., female Today's Date: 09/02/2023  END OF SESSION:  PT End of Session - 09/02/23 1446     Visit Number 4    Date for PT Re-Evaluation 10/12/23    Authorization Type HTA    Progress Note Due on Visit 10    PT Start Time 1445    PT Stop Time 1525    PT Time Calculation (min) 40 min    Activity Tolerance Patient tolerated treatment well    Behavior During Therapy [facility] for tasks assessed/performed              Past Medical History:  Diagnosis Date   Arthritis    osteoarthritis   Breast cancer (HCC) 11/2021   CAD (coronary artery disease)    Descending thoracic aortic dissection (HCC)    followed by Dr. 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 Abel Hoe & Dr. Sherene Dilling .   Severe aortic stenosis    Past Surgical History:  Procedure Laterality Date   AXILLARY SENTINEL NODE BIOPSY Left 12/29/2021   Procedure: LEFT AXILLARY SENTINEL NODE BIOPSY;  Surgeon: Enid Harry, MD;  Location: [facility] OR;  Service: General;  Laterality: Left;   BREAST BIOPSY Bilateral 1970's   x2 one a side   BREAST LUMPECTOMY WITH RADIOACTIVE SEED LOCALIZATION Left 12/29/2021   Procedure: BREAST LUMPECTOMY WITH RADIOACTIVE SEED LOCALIZATION X2;  Surgeon: Enid Harry, MD;  Location: [facility] OR;  Service: General;  Laterality: Left;   CATARACT EXTRACTION     bilateral   implantable loop recorder  removal  05/30/2021   MDT reveal LINQ removed by Dr Nunzio Belch   INTRAOPERATIVE TRANSTHORACIC ECHOCARDIOGRAM N/A 08/19/2021   Procedure: INTRAOPERATIVE TRANSTHORACIC ECHOCARDIOGRAM;  Surgeon: Odie Benne, MD;  Location: [facility] OR;  Service: Open Heart Surgery;  Laterality: N/A;   LOOP RECORDER INSERTION N/A 08/11/2017   Procedure: LOOP RECORDER INSERTION;  Surgeon: Jolly Needle, MD;  Location: [facility] INVASIVE CV LAB;  Service: Cardiovascular;  Laterality: N/A;   RADIOACTIVE SEED GUIDED AXILLARY SENTINEL LYMPH NODE Left 12/29/2021   Procedure: LEFT AXILLARY NODE SEED GUIDED EXCISION;  Surgeon: Enid Harry, MD;  Location: [facility] OR;  Service: General;  Laterality: Left;   RETINAL DETACHMENT SURGERY  2000   RIGHT/LEFT HEART CATH AND CORONARY ANGIOGRAPHY N/A 06/19/2021   Procedure: RIGHT/LEFT HEART CATH AND CORONARY ANGIOGRAPHY;  Surgeon: Arty Binning, MD;  Location: [facility] INVASIVE CV LAB;  Service: Cardiovascular;  Laterality: N/A;   THYROIDECTOMY  1967   TONSILLECTOMY     TOTAL KNEE ARTHROPLASTY  04/25/2012   Procedure: TOTAL KNEE BILATERAL;  Surgeon: Aurther Blue, MD;  Location: [facility] ORS;  Service: Orthopedics;  Laterality: Bilateral;   TRANSCATHETER AORTIC VALVE REPLACEMENT, TRANSFEMORAL N/A 08/19/2021   Procedure: Transcatheter Aortic Valve Replacement, Transfemoral Using 23mm Sapien 3 Ultra Edwards Valve;  Surgeon: Abel Hoe,  Coy Ditty, MD;  Location: [facility] OR;  Service: Open Heart Surgery;  Laterality: N/A;   Patient Active Problem List   Diagnosis Date Noted   Amaurosis fugax, left eye 09/01/2023   Estrogen receptor positive neoplasm 09/01/2023   Paroxysmal supraventricular tachycardia (HCC) 06/23/2022   Irritable bowel syndrome 06/23/2022   Giant cell arteritis (HCC) 06/23/2022   Cerebral atherosclerosis 06/23/2022   Acquired hypothyroidism 06/23/2022   Benign essential hypertension 06/23/2022   Sigmoid diverticulitis 10/18/2021   Genetic testing 09/12/2021   Family history of  breast cancer 08/28/2021   Polymyalgia rheumatica (HCC) 08/19/2021   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   Senile osteoporosis 08/15/2021   Malignant neoplasm of lower-outer quadrant of left breast of female, estrogen receptor positive (HCC) 07/28/2021   Severe aortic stenosis 06/19/2021   Secondary hypercoagulable state (HCC) 08/28/2019   Coronary artery disease of native artery of native heart with stable angina pectoris (HCC) 09/15/2018   Paroxysmal atrial fibrillation (HCC) 10/04/2017   Heart murmur 03/17/2017   Lichen sclerosus 04/02/2016    Class: History of   Hypertension    GERD (gastroesophageal reflux disease)    Arthritis    Hypercholesteremia    OA (osteoarthritis) of knee 04/25/2012   Fibroid 2002   Dysplasia of cervix, low grade (CIN 1) 1990    PCP: Aldo Hun, MD  REFERRING PROVIDER: Daina Drum, MD   REFERRING DIAG: R19.7 (ICD-10-CM) - Diarrhea, unspecified type R11.0 (ICD-10-CM) - Nausea without vomiting K21.9 (ICD-10-CM) - Gastroesophageal reflux disease, unspecified whether esophagitis present R10.30 (ICD-10-CM) - Lower abdominal pain  THERAPY DIAG:  Muscle weakness (generalized)  Unspecified lack of coordination  Abnormal posture  Rationale for Evaluation and Treatment: Rehabilitation  ONSET DATE: chronic  SUBJECTIVE:                                                                                                                                                                                           SUBJECTIVE STATEMENT: Pt states that she has a lot of gas today. She states that she is having daily bowel movement. She is having to twist a little and has tried splinting more. Sometimes she goes multiple times a day in order to completely empty.   PAIN:  Are you having pain? No   PRECAUTIONS: Other: Hx breast cancer 2023 and currently on medication  RED FLAGS: None   WEIGHT BEARING RESTRICTIONS: No  FALLS:  Has  patient fallen in last 6 months? No  OCCUPATION: not working  ACTIVITY LEVEL : walking, trainer 1x/week  PLOF: Independent  PATIENT GOALS: pt would like to have less gas, more consistent bowel  movements with complete emptying  PERTINENT HISTORY:  Diverticulitis, hemorrhoids, hx breast cancer 2023, breast lumpectomy, thyroidectomy, bil TKA, hyperthyroidism, polymyalgia rheumatica, osteoarthritis, lichens sclerosis    BOWEL MOVEMENT: Pain with bowel movement: No Type of bowel movement:Frequency 1x and Strain yes - when stools are mushy they are hard to get out completely Fully empty rectum: Yes: usually Leakage: No Pads: No Fiber supplement/laxative No  URINATION: Pain with urination: No Fully empty bladder: 90% of the time  Stream: Strong Urgency: No Frequency: WNL Leakage: none Pads: No  INTERCOURSE: Not sexually active for long time  PREGNANCY: Vaginal deliveries 2 Tearing No Episiotomy No C-section deliveries 0 Currently pregnant No  PROLAPSE: None   OBJECTIVE:  Note: Objective measures were completed at Evaluation unless otherwise noted.  07/20/23:  PATIENT SURVEYS:   PFIQ-7: 54  COGNITION: Overall cognitive status: Within functional limits for tasks assessed     SENSATION: Light touch: Appears intact  FUNCTIONAL TESTS:  Squat: WNL Single leg stance:  Rt: pelvic drop  Lt: pelvic drop Curl-up test: significant abdominal distortion throughout midline    GAIT: Assistive device utilized: None Comments: WNL  POSTURE: rounded shoulders, forward head, decreased lumbar lordosis, increased thoracic kyphosis, posterior pelvic tilt, and very mild Lt thoracic curvature, Rt posterior pelvic rotation    LUMBARAROM/PROM:  A/PROM A/PROM  Eval (% available)  Flexion 75  Extension 50  Right lateral flexion 50  Left lateral flexion 50  Right rotation 50  Left rotation 50   (Blank rows = not tested)   PALPATION:  Pelvic Alignment: Rt posterior  rotation   Abdominal: no tenderness or restriction noted today                External Perineal Exam: lichens plaques, fragile tissue, phimosis, labial fusion, decreased vulvar mobility                             Internal Pelvic Floor: atrophy; poor generation of intra-rectal pressure with push during rectal exam   Patient confirms identification and approves PT to assess internal pelvic floor and treatment Yes  PELVIC MMT:   MMT eval  Vaginal None initially; with training 1/5, 6 second hold, and 3 repeat contractions   Internal Anal Sphincter 1/5  External Anal Sphincter 1/5  Puborectalis 1/5  Diastasis Recti 1 finger width  (Blank rows = not tested)        TONE: low  PROLAPSE: Grade 1 anterior/grade 2 posterior vaginal wall laxity  TODAY'S TREATMENT:                                                                                                                              DATE:  09/02/23 Manual: Supine bowel mobilization Ileocecal valve release Diaphragm release Therapeutic activities: Education on ILU massage and difference form the bowel massage she has been performing Talked with her about discussing laxative program long term and titrating after  several months if KUB positive for stool burden   08/23/23 Manual: Supine bowel mobilization Neuromuscular re-education: Transversus abdominus training with multimodal cues for improved motor control and breath coordination Transversus abdominus isometrics 10x Bil supine UE ball press with transversus abdominus and pelvic floor muscle contractions and breath coordination 10x Supine hip adduction ball press with transversus abdominus and pelvic floor muscle contractions and breath coordination 10x Bridge with hip adduction, transversus abdominus, and pelvic floor muscle 2 x 10 Seated hip adduction ball press with transversus abdominus and pelvic floor muscle 2 x 10 Seated hip abduction red band with transversus abdominus  and pelvic floor muscle 2 x 10 Seated resisted march red band with transversus abdominus and pelvic floor muscle 2 x 10 Squats to table with cues to stay contracted in pelvic floor muscles at 50% 10x Exercises: Seated hamstring stretch 60 seconds bil Seated piriformis stretch 60 sec bil Lower trunk rotation 2 x 10   08/19/23 Manual: Supine bowel mobilization Exercises: Supine bent knee fall out 10x bil Lower trunk rotation 3 x 10 Single knee to chest 10x bil Open books 10x bil Bridge 2 x 10 Therapeutic activities: Strict bowel retraining protocol Increasing water/fiber intake Increasing exercise working towards 150 minutes of moderate intensity exercise each week   PATIENT EDUCATION:  Education details: See above  Person educated: Patient Education method: Programmer, multimedia, Facilities manager, Actor cues, Verbal cues, and Handouts Education comprehension: verbalized understanding  HOME EXERCISE PROGRAM: [id number]  ASSESSMENT:  CLINICAL IMPRESSION: Pt having a lot of gas and bloating today. She states that she is having regular bowel movements now with improved ease, but she still has to use splinting and some movement in her body to help empty. Due to some days having up to 3 bowel movements, there is a likely chance that she is not emptying well and has a stool burden. We discussed that if this is the case, there is a chance that PT will continue to be helpful to help her empty more completely and reduce gas and bloating. However, there is also a chance with good consistently and regularity of bowel movements that may not have a stool burden behind these symptoms and further GI work up will be beneficial in helping to determine what is causing her symptoms. She may continue to benefit from skilled PT intervention in order to improve pelvic floor muscle strength, improve complete bowel emptying, optimize push mechanics, begin functional strengthening progrma.   OBJECTIVE IMPAIRMENTS:  decreased activity tolerance, decreased coordination, decreased endurance, decreased mobility, decreased strength, increased fascial restrictions, increased muscle spasms, impaired tone, postural dysfunction, and pain.   ACTIVITY LIMITATIONS: bowel movements   PARTICIPATION LIMITATIONS: community activity  PERSONAL FACTORS: 3+ comorbidities: medical history are also affecting patient's functional outcome.   REHAB POTENTIAL: Good  CLINICAL DECISION MAKING: Evolving/moderate complexity  EVALUATION COMPLEXITY: Moderate   GOALS: Goals reviewed with patient? Yes  SHORT TERM GOALS: Target date: 08/17/2023 - updated 08/19/23   Pt will be independent with HEP.   Baseline: Goal status: IN PROGRESS 08/19/23  2.  Pt will be independent with use of squatty potty, relaxed toileting mechanics, and improved bowel movement techniques in order to increase ease of bowel movements and complete evacuation.   Baseline:  Goal status: IN PROGRESS 08/19/23  3.  Pt will be able to generate improved intra-rectal pressure to help with complete evacuation of bowel movement.  Baseline:  Goal status: IN PROGRESS 08/19/23  4.  Pt will report 25% improvement in symptoms.  Baseline:  Goal status: IN PROGRESS 08/19/23   LONG TERM GOALS: Target date: 10/12/23  Pt will be independent with advanced HEP.   Baseline:  Goal status: IN PROGRESS 08/19/23  2.  Pt will report 75% improvement in symptoms.  Baseline:  Goal status: IN PROGRESS 08/19/23  3.  Pt will be able to perform curl-up test without abdominal distortion.  Baseline:  Goal status: IN PROGRESS 08/19/23  4.  Pt will be able to perform single leg stance without pelvic drop to demonstrate improved core facilitation.  Baseline:  Goal status: IN PROGRESS 08/19/23  5.  Pt will demonstrate normal pelvic floor muscle tone and A/ROM, able to achieve 3/5 strength with contractions and 10 sec endurance, in order to provide appropriate lumbopelvic support in functional  activities.   Baseline:  Goal status: IN PROGRESS 08/19/23  6.  Pt will demonstrate <25 on PFIQ-7 to demonstrate decreased functional impact of bowel dysfunction.  Baseline:  Goal status: IN PROGRESS 08/19/23  PLAN:  PT FREQUENCY: 1-2x/week  PT DURATION: 12 weeks  PLANNED INTERVENTIONS: 97110-Therapeutic exercises, 97530- Therapeutic activity, 97112- Neuromuscular re-education, 97535- Self Care, 96045- Manual therapy, Dry Needling, and Biofeedback  PLAN FOR NEXT SESSION: bowel mobilization; stretches; begin core training    Verlena Glenn, PT, DPT05/22/253:29 PM

## 2023-09-02 NOTE — Telephone Encounter (Signed)
 Received clearance via epic okay to hold Eliquis  2 days prior to procedure spoke to patient she is aware patient verbalized understanding.

## 2023-09-02 NOTE — Patient Instructions (Signed)
"  ILU" massage  I: Massage starting high on left side and down to lower left side L: Start over high on the right side, massage to high on the left side, and then down left side U: Start on the lower right side, massage up to the upper right side, massage across to the upper left side, and massage down to the lower left side   If the x-ray/KUB shows that you have a stool burden, ask the doctor about staying on a laxative program and then titrating after several months. This would be best to do right after the colonoscopy because you'll have just done a cleanse.

## 2023-09-03 ENCOUNTER — Telehealth: Payer: Self-pay

## 2023-09-03 NOTE — Telephone Encounter (Signed)
 Second attempt called patient to advise okay to hold Eliqius 2 days prior to procedure left message on voice mail also sent a mychart message.

## 2023-09-07 ENCOUNTER — Telehealth (HOSPITAL_COMMUNITY): Payer: Self-pay | Admitting: *Deleted

## 2023-09-07 ENCOUNTER — Other Ambulatory Visit (HOSPITAL_COMMUNITY): Payer: Self-pay | Admitting: *Deleted

## 2023-09-07 DIAGNOSIS — I480 Paroxysmal atrial fibrillation: Secondary | ICD-10-CM | POA: Diagnosis not present

## 2023-09-07 DIAGNOSIS — M353 Polymyalgia rheumatica: Secondary | ICD-10-CM | POA: Diagnosis not present

## 2023-09-07 DIAGNOSIS — R410 Disorientation, unspecified: Secondary | ICD-10-CM | POA: Diagnosis not present

## 2023-09-07 DIAGNOSIS — I110 Hypertensive heart disease with heart failure: Secondary | ICD-10-CM | POA: Diagnosis not present

## 2023-09-07 DIAGNOSIS — I2510 Atherosclerotic heart disease of native coronary artery without angina pectoris: Secondary | ICD-10-CM | POA: Diagnosis not present

## 2023-09-07 DIAGNOSIS — R000 Tachycardia, unspecified: Secondary | ICD-10-CM | POA: Diagnosis not present

## 2023-09-07 DIAGNOSIS — K589 Irritable bowel syndrome without diarrhea: Secondary | ICD-10-CM | POA: Diagnosis not present

## 2023-09-07 DIAGNOSIS — G453 Amaurosis fugax: Secondary | ICD-10-CM | POA: Diagnosis not present

## 2023-09-07 DIAGNOSIS — E039 Hypothyroidism, unspecified: Secondary | ICD-10-CM | POA: Diagnosis not present

## 2023-09-07 DIAGNOSIS — I350 Nonrheumatic aortic (valve) stenosis: Secondary | ICD-10-CM | POA: Diagnosis not present

## 2023-09-07 DIAGNOSIS — I5032 Chronic diastolic (congestive) heart failure: Secondary | ICD-10-CM | POA: Diagnosis not present

## 2023-09-07 MED ORDER — ENTRESTO 49-51 MG PO TABS: 1.0000 | ORAL_TABLET | Freq: Two times a day (BID) | ORAL | 11 refills | Status: DC

## 2023-09-07 NOTE — Telephone Encounter (Signed)
 Pt requesting refill for Entresto  to [facility] on [address], pt out of med.

## 2023-09-08 ENCOUNTER — Telehealth: Payer: Self-pay | Admitting: Pediatrics

## 2023-09-08 DIAGNOSIS — D225 Melanocytic nevi of trunk: Secondary | ICD-10-CM | POA: Diagnosis not present

## 2023-09-08 DIAGNOSIS — Z85828 Personal history of other malignant neoplasm of skin: Secondary | ICD-10-CM | POA: Diagnosis not present

## 2023-09-08 DIAGNOSIS — L905 Scar conditions and fibrosis of skin: Secondary | ICD-10-CM | POA: Diagnosis not present

## 2023-09-08 DIAGNOSIS — D1801 Hemangioma of skin and subcutaneous tissue: Secondary | ICD-10-CM | POA: Diagnosis not present

## 2023-09-08 DIAGNOSIS — D692 Other nonthrombocytopenic purpura: Secondary | ICD-10-CM | POA: Diagnosis not present

## 2023-09-08 DIAGNOSIS — L570 Actinic keratosis: Secondary | ICD-10-CM | POA: Diagnosis not present

## 2023-09-08 DIAGNOSIS — D485 Neoplasm of uncertain behavior of skin: Secondary | ICD-10-CM | POA: Diagnosis not present

## 2023-09-08 DIAGNOSIS — L814 Other melanin hyperpigmentation: Secondary | ICD-10-CM | POA: Diagnosis not present

## 2023-09-08 DIAGNOSIS — L821 Other seborrheic keratosis: Secondary | ICD-10-CM | POA: Diagnosis not present

## 2023-09-08 NOTE — Telephone Encounter (Signed)
 I spoke on the telephone with Ms. Wendel who reported symptoms of nausea but no vomiting associated with bowel preparation for colonoscopy.  States that she completed the first half of her Sutab prep.  She is now attempting to consume 16 ounces of water but is feeling very nauseated.  Queries if she can take Zofran  which she has at home.  Advised that it would be safe for her to take Zofran  4 to 8 mg at this juncture to alleviate nausea.  Also reviewed slowing down her bowel prep and drinking the water more slowly.  All questions answered.

## 2023-09-08 NOTE — Telephone Encounter (Signed)
 Refill sent.

## 2023-09-09 ENCOUNTER — Encounter: Payer: Self-pay | Admitting: Internal Medicine

## 2023-09-09 ENCOUNTER — Ambulatory Visit (AMBULATORY_SURGERY_CENTER): Admitting: Internal Medicine

## 2023-09-09 ENCOUNTER — Ambulatory Visit: Payer: Self-pay | Admitting: Internal Medicine

## 2023-09-09 VITALS — BP 104/47 | HR 56 | Temp 97.4°F | Resp 12 | Ht 65.0 in | Wt 150.0 lb

## 2023-09-09 DIAGNOSIS — D125 Benign neoplasm of sigmoid colon: Secondary | ICD-10-CM | POA: Diagnosis not present

## 2023-09-09 DIAGNOSIS — Z8719 Personal history of other diseases of the digestive system: Secondary | ICD-10-CM | POA: Diagnosis not present

## 2023-09-09 DIAGNOSIS — K5730 Diverticulosis of large intestine without perforation or abscess without bleeding: Secondary | ICD-10-CM | POA: Diagnosis not present

## 2023-09-09 DIAGNOSIS — I2510 Atherosclerotic heart disease of native coronary artery without angina pectoris: Secondary | ICD-10-CM | POA: Diagnosis not present

## 2023-09-09 DIAGNOSIS — R110 Nausea: Secondary | ICD-10-CM

## 2023-09-09 DIAGNOSIS — D120 Benign neoplasm of cecum: Secondary | ICD-10-CM | POA: Diagnosis not present

## 2023-09-09 DIAGNOSIS — K648 Other hemorrhoids: Secondary | ICD-10-CM

## 2023-09-09 DIAGNOSIS — E7800 Pure hypercholesterolemia, unspecified: Secondary | ICD-10-CM | POA: Diagnosis not present

## 2023-09-09 DIAGNOSIS — R1030 Lower abdominal pain, unspecified: Secondary | ICD-10-CM | POA: Diagnosis not present

## 2023-09-09 DIAGNOSIS — K635 Polyp of colon: Secondary | ICD-10-CM | POA: Diagnosis not present

## 2023-09-09 DIAGNOSIS — I10 Essential (primary) hypertension: Secondary | ICD-10-CM | POA: Diagnosis not present

## 2023-09-09 DIAGNOSIS — D122 Benign neoplasm of ascending colon: Secondary | ICD-10-CM

## 2023-09-09 MED ORDER — SODIUM CHLORIDE 0.9 % IV SOLN: 500.0000 mL | Freq: Once | INTRAVENOUS | Status: DC

## 2023-09-09 NOTE — Progress Notes (Signed)
 GASTROENTEROLOGY PROCEDURE H&P NOTE   Primary Care Physician: Aldo Hun, MD    Reason for Procedure:   History of diverticulitis  Plan:    Colonoscopy  Patient is appropriate for endoscopic procedure(s) in the ambulatory ([facility]) setting.  The nature of the procedure, as well as the risks, benefits, and alternatives were carefully and thoroughly reviewed with the patient. Ample time for discussion and questions allowed. The patient understood, was satisfied, and agreed to proceed.     HPI: Marissa Powers is a 83 y.o. female who presents for colonoscopy for evaluation of history of diverticulitis.  Patient was most recently seen in the Gastroenterology Clinic on 09/01/23.  No interval change in medical history since that appointment. Please refer to that note for full details regarding GI history and clinical presentation.   Past Medical History:  Diagnosis Date   Arthritis    osteoarthritis   Breast cancer (HCC) 11/2021   left   CAD (coronary artery disease)    Descending thoracic aortic dissection (HCC)    followed by Dr. 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 Abel Hoe & Dr. Sherene Dilling .   Severe aortic stenosis     Past Surgical History:  Procedure Laterality Date   AXILLARY SENTINEL NODE BIOPSY Left 12/29/2021   Procedure: LEFT AXILLARY SENTINEL NODE BIOPSY;  Surgeon: Enid Harry, MD;  Location: [facility] OR;  Service: General;  Laterality: Left;   BREAST BIOPSY Bilateral 1970's   x2 one  a side   BREAST LUMPECTOMY WITH RADIOACTIVE SEED LOCALIZATION Left 12/29/2021   Procedure: BREAST LUMPECTOMY WITH RADIOACTIVE SEED LOCALIZATION X2;  Surgeon: Enid Harry, MD;  Location: [facility] OR;  Service: General;  Laterality: Left;   CATARACT EXTRACTION Bilateral    bilateral   COLONOSCOPY     implantable loop recorder removal  05/30/2021   MDT reveal LINQ removed by Dr Nunzio Belch   INTRAOPERATIVE TRANSTHORACIC ECHOCARDIOGRAM N/A 08/19/2021   Procedure: INTRAOPERATIVE TRANSTHORACIC ECHOCARDIOGRAM;  Surgeon: Odie Benne, MD;  Location: [facility] OR;  Service: Open Heart Surgery;  Laterality: N/A;   LOOP RECORDER INSERTION N/A 08/11/2017   Procedure: LOOP RECORDER INSERTION;  Surgeon: Jolly Needle, MD;  Location: [facility] INVASIVE CV LAB;  Service: Cardiovascular;  Laterality: N/A;   RADIOACTIVE SEED GUIDED AXILLARY SENTINEL LYMPH NODE Left 12/29/2021   Procedure: LEFT AXILLARY NODE SEED GUIDED EXCISION;  Surgeon: Enid Harry, MD;  Location: [facility] OR;  Service: General;  Laterality: Left;   RETINAL DETACHMENT SURGERY  2000   RIGHT/LEFT HEART CATH AND CORONARY ANGIOGRAPHY N/A 06/19/2021   Procedure: RIGHT/LEFT HEART CATH AND CORONARY ANGIOGRAPHY;  Surgeon: Arty Binning, MD;  Location: [facility] INVASIVE CV LAB;  Service: Cardiovascular;  Laterality: N/A;   THYROIDECTOMY  1967   TONSILLECTOMY  1950   TOTAL KNEE ARTHROPLASTY  04/25/2012   Procedure: TOTAL KNEE BILATERAL;  Surgeon: Aurther Blue, MD;  Location: [facility] ORS;  Service:  Orthopedics;  Laterality: Bilateral;   TRANSCATHETER AORTIC VALVE REPLACEMENT, TRANSFEMORAL N/A 08/19/2021   Procedure: Transcatheter Aortic Valve Replacement, Transfemoral Using 23mm Sapien 3 Ultra Edwards Valve;  Surgeon: Odie Benne, MD;  Location: [facility] OR;  Service: Open Heart Surgery;  Laterality: N/A;    Prior to Admission medications   Medication Sig Start Date End Date Taking? Authorizing Provider  Sodium Sulfate-Mag Sulfate-KCl (SUTAB) [phone]  MG TABS Use as directed for colonoscopy. MANUFACTURER CODES!! BIN: [id number] PCN: CN GROUP: [id number] MEMBER ID: [id number];VHQ AS SECONDARY INSURANCE ;NO PRIOR AUTHORIZATION 09/01/23  Yes Daina Drum, MD  ACETAMINOPHEN  PO Take 650 mg by mouth as needed for moderate pain (pain score 4-6).    [provider]  amiodarone  (PACERONE ) 200 MG tablet Take 1 tablet (200 mg total) by mouth 2 (two) times daily for 30 days, THEN 1 tablet (200 mg total) daily. 08/13/23 09/11/24  Nathanel Bal, PA-C  b complex vitamins capsule Take 1 capsule by mouth daily.    [provider]  Calcium  Carb-Cholecalciferol  (CALCIUM  + D3 PO) Take 1 tablet by mouth in the morning.    [provider]  cholecalciferol  (VITAMIN D3) 25 MCG (1000 UNIT) tablet Take 1,000 Units by mouth in the morning.    [provider]  dapagliflozin  propanediol (FARXIGA ) 10 MG TABS tablet Take 1 tablet (10 mg total) by mouth daily before breakfast. 01/27/23   Ardia Kraft, PA-C  denosumab  (PROLIA ) 60 MG/ML SOSY injection Inject 60 mg into the skin every 6 (six) months.    [provider]  ELIQUIS  5 MG TABS tablet TAKE 1 TABLET(5 MG) BY MOUTH TWICE DAILY 08/20/23   Bensimhon, Rheta Celestine, MD  folic acid  (FOLVITE ) 1 MG tablet Take 2 mg by mouth in the morning.    [provider]  hydrochlorothiazide  (MICROZIDE ) 12.5 MG capsule Take 12.5 mg by mouth daily. 08/09/23   [provider]  letrozole  (FEMARA ) 2.5 MG tablet TAKE 1 TABLET(2.5 MG) BY MOUTH DAILY 05/11/23   Gudena, Vinay, MD  levothyroxine  (SYNTHROID ) 100 MCG tablet Take 100 mcg by mouth daily before breakfast.    [provider]  loperamide (IMODIUM) 2 MG capsule Take 2-4 mg by mouth 4 (four) times daily as needed (upset stomach/diarrhea/loose stools).    [provider]  metoprolol  succinate (TOPROL -XL) 50 MG 24 hr tablet Take 50 mg by mouth daily.    [provider]  nitroGLYCERIN  (NITROSTAT ) 0.4 MG SL tablet  Place 1 tablet (0.4 mg total) under the tongue every 5 (five) minutes as needed for chest pain. 07/05/20   Arty Binning, MD  ondansetron  (ZOFRAN -ODT) 4 MG disintegrating tablet Take 4 mg by mouth every 8 (eight) hours as needed. 06/03/23   [provider]  pantoprazole  (PROTONIX ) 40 MG tablet Take 40 mg by mouth daily. 01/08/23   [provider]  predniSONE  (DELTASONE ) 5 MG tablet She is on a taper for 1 month Currently taking 10 mg once daily 08/10/23   [provider]  REPATHA SURECLICK 140 MG/ML SOAJ Inject 140 mg into the skin every 14 (fourteen) days. 08/06/21   [provider]  sacubitril-valsartan (ENTRESTO ) 49-51 MG Take 1 tablet by mouth 2 (two) times daily. 09/07/23   Bensimhon, Rheta Celestine, MD  zolpidem  (AMBIEN ) 5 MG tablet Take 2.5 mg by mouth at bedtime.    [provider]    Current Outpatient Medications  Medication Sig Dispense Refill   amiodarone  (PACERONE ) 200 MG tablet Take 1 tablet (200  mg total) by mouth 2 (two) times daily for 30 days, THEN 1 tablet (200 mg total) daily. 60 tablet 3   amoxicillin  (AMOXIL ) 500 MG tablet Take 4 tablets by mouth 1 hour prior to dental work     b complex vitamins capsule Take 1 capsule by mouth daily.     Calcium  Carb-Cholecalciferol  (CALCIUM  + D3 PO) Take 1 tablet by mouth in the morning.     cholecalciferol  (VITAMIN D3) 25 MCG (1000 UNIT) tablet Take 1,000 Units by mouth in the morning.     dapagliflozin  propanediol (FARXIGA ) 10 MG TABS tablet Take 1 tablet (10 mg total) by mouth daily before breakfast. 30 tablet 7   folic acid  (FOLVITE ) 1 MG tablet Take 2 mg by mouth in the morning.     hydrochlorothiazide  (MICROZIDE ) 12.5 MG capsule Take 12.5 mg by mouth daily.     letrozole  (FEMARA ) 2.5 MG tablet TAKE 1 TABLET(2.5 MG) BY MOUTH DAILY 90 tablet 3   levothyroxine  (SYNTHROID ) 100 MCG tablet Take 100 mcg by mouth daily before breakfast.     metoprolol  succinate (TOPROL -XL) 50 MG 24 hr tablet Take 50 mg by  mouth daily.     ondansetron  (ZOFRAN -ODT) 4 MG disintegrating tablet Take 4 mg by mouth every 8 (eight) hours as needed.     pantoprazole  (PROTONIX ) 40 MG tablet Take 40 mg by mouth daily.     predniSONE  (DELTASONE ) 5 MG tablet She is on a taper for 1 month Currently taking 10 mg once daily     sacubitril-valsartan (ENTRESTO ) 49-51 MG Take 1 tablet by mouth 2 (two) times daily. 60 tablet 11   zolpidem  (AMBIEN ) 5 MG tablet Take 2.5 mg by mouth at bedtime.     ACETAMINOPHEN  PO Take 650 mg by mouth as needed for moderate pain (pain score 4-6).     denosumab  (PROLIA ) 60 MG/ML SOSY injection Inject 60 mg into the skin every 6 (six) months.     ELIQUIS  5 MG TABS tablet TAKE 1 TABLET(5 MG) BY MOUTH TWICE DAILY 60 tablet 2   loperamide (IMODIUM) 2 MG capsule Take 2-4 mg by mouth 4 (four) times daily as needed (upset stomach/diarrhea/loose stools).     nitroGLYCERIN  (NITROSTAT ) 0.4 MG SL tablet Place 1 tablet (0.4 mg total) under the tongue every 5 (five) minutes as needed for chest pain. 25 tablet 3   REPATHA SURECLICK 140 MG/ML SOAJ Inject 140 mg into the skin every 14 (fourteen) days.     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 Hives 01/28/2022   Bee venom Other (See Comments) 07/05/2023    Family History  Problem Relation Age of Onset   AAA (abdominal aortic aneurysm) Mother 53   Healthy Father    Heart disease Brother        stints   Breast cancer Maternal Aunt 59   Breast cancer Maternal Grandmother 69   Heart attack Maternal Grandmother    Hypertension Son    Hypertension Son    Colon cancer Neg Hx    Colon polyps Neg Hx     Esophageal cancer Neg Hx    Stomach cancer Neg Hx  Rectal cancer Neg Hx     Social History   Socioeconomic History   Marital status: Widowed    Spouse name: Not on file   Number of children: 2   Years of education: Not on file   Highest education level: Not on file  Occupational History   Occupation: retired  Tobacco Use   Smoking status: Never    Passive exposure: Never   Smokeless tobacco: Never  Vaping Use   Vaping status: Never Used  Substance and Sexual Activity   Alcohol  use: Yes    Alcohol /week: 4.0 - 6.0 standard drinks of alcohol     Types: 4 - 6 Standard drinks or equivalent per week    Comment: once a week   Drug use: No   Sexual activity: Not Currently    Partners: Male    Birth control/protection: Post-menopausal  Other Topics Concern   Not on file  Social History Narrative   Not on file   Social Drivers of Health   Financial Resource Strain: Low Risk  (10/18/2021)   Received from [facility] [employer], [facility]   Overall Physicist, medical Strain (CARDIA)    Difficulty of Paying Living Expenses: Not hard at all  Food Insecurity: No Food Insecurity (10/18/2021)   Received from [facility] [employer], [facility]   Hunger Vital Sign    Worried About Running Out of Food in the Last Year: Never true    Ran Out of Food in the Last Year: Never true  Transportation Needs: No Transportation Needs (10/18/2021)   Received from [facility] [employer], [facility]   [employer] - Administrator, Civil Service (Medical): No    Lack of Transportation (Non-Medical): No  Physical Activity: Not on file  Stress: Not on file  Social Connections: Unknown (06/10/2022)   Received from [facility], [facility]   Social Network    Social Network: Not on file  Intimate Partner Violence: Unknown (06/10/2022)   Received from [facility], [facility]   HITS    Physically Hurt: Not on file     Insult or Talk Down To: Not on file    Threaten Physical Harm: Not on file    Scream or Curse: Not on file    Physical Exam: Vital signs in last 24 hours: BP (!) 146/66   Pulse (!) 56   Temp (!) 97.4 F (36.3 C) (Temporal)   Ht 5\' 5"  (1.651 m)   Wt 150 lb (68 kg)   SpO2 98%   BMI 24.96 kg/m  GEN: NAD EYE: Sclerae anicteric ENT: MMM CV: Non-tachycardic Pulm: No increased WOB GI: Soft NEURO:  Alert & Oriented   Regino Caprio, MD [facility]   09/09/2023 10:10 AM

## 2023-09-09 NOTE — Patient Instructions (Signed)
-  Handout on polyps, hemorrhoids and diverticulosis provided. -await pathology results. -repeat colonoscopy for surveillance recommended. Date to be determined when pathology result become available.  -Restart Eliquis  tomorrow -Return to GI clinic in 2-3 months   YOU HAD AN ENDOSCOPIC PROCEDURE TODAY AT THE [facility]:   Refer to the procedure report that was given to you for any specific questions about what was found during the examination.  If the procedure report does not answer your questions, please call your gastroenterologist to clarify.  If you requested that your care partner not be given the details of your procedure findings, then the procedure report has been included in a sealed envelope for you to review at your convenience later.  YOU SHOULD EXPECT: Some feelings of bloating in the abdomen. Passage of more gas than usual.  Walking can help get rid of the air that was put into your GI tract during the procedure and reduce the bloating. If you had a lower endoscopy (such as a colonoscopy or flexible sigmoidoscopy) you may notice spotting of blood in your stool or on the toilet paper. If you underwent a bowel prep for your procedure, you may not have a normal bowel movement for a few days.  Please Note:  You might notice some irritation and congestion in your nose or some drainage.  This is from the oxygen used during your procedure.  There is no need for concern and it should clear up in a day or so.  SYMPTOMS TO REPORT IMMEDIATELY:  Following lower endoscopy (colonoscopy or flexible sigmoidoscopy):  Excessive amounts of blood in the stool  Significant tenderness or worsening of abdominal pains  Swelling of the abdomen that is new, acute  Fever of 100F or higher  For urgent or emergent issues, a gastroenterologist can be reached at any hour by calling [phone]. Do not use MyChart messaging for urgent concerns.    DIET:  We do recommend a small meal at first,  but then you may proceed to your regular diet.  Drink plenty of fluids but you should avoid alcoholic beverages for 24 hours.  ACTIVITY:  You should plan to take it easy for the rest of today and you should NOT DRIVE or use heavy machinery until tomorrow (because of the sedation medicines used during the test).    FOLLOW UP: Our staff will call the number listed on your records the next business day following your procedure.  We will call around 7:15- 8:00 am to check on you and address any questions or concerns that you may have regarding the information given to you following your procedure. If we do not reach you, we will leave a message.     If any biopsies were taken you will be contacted by phone or by letter within the next 1-3 weeks.  Please call us  at [phone] if you have not heard about the biopsies in 3 weeks.    SIGNATURES/CONFIDENTIALITY: You and/or your care partner have signed paperwork which will be entered into your electronic medical record.  These signatures attest to the fact that that the information above on your After Visit Summary has been reviewed and is understood.  Full responsibility of the confidentiality of this discharge information lies with you and/or your care-partner.

## 2023-09-09 NOTE — Op Note (Signed)
 [facility] Patient Name: Marissa Powers Procedure Date: 09/09/2023 10:11 AM MRN: [id number] Endoscopist: Freada Jacobs Exline , , [id number] Age: 83 Referring MD:  Date of Birth: [birth year] Gender: Female Account #: [id number] Procedure:                Colonoscopy Indications:              Follow-up of diverticulitis Medicines:                Monitored Anesthesia Care Procedure:                Pre-Anesthesia Assessment:                           - Prior to the procedure, a History and Physical                            was performed, and patient medications and                            allergies were reviewed. The patient's tolerance of                            previous anesthesia was also reviewed. The risks                            and benefits of the procedure and the sedation                            options and risks were discussed with the patient.                            All questions were answered, and informed consent                            was obtained. Prior Anticoagulants: The patient has                            taken Eliquis  (apixaban ), last dose was 3 days                            prior to procedure. ASA Grade Assessment: II - A                            patient with mild systemic disease. After reviewing                            the risks and benefits, the patient was deemed in                            satisfactory condition to undergo the procedure.                           After obtaining informed consent, the colonoscope  was passed under direct vision. Throughout the                            procedure, the patient's blood pressure, pulse, and                            oxygen saturations were monitored continuously. The                            [id number]-HQ190L Colonoscope [id number] was introduced                            through the anus and advanced to the the cecum,                            identified  by appendiceal orifice and ileocecal                            valve. The colonoscopy was performed without                            difficulty. The patient tolerated the procedure                            well. The quality of the bowel preparation was                            good. The ileocecal valve, appendiceal orifice, and                            rectum were photographed. Scope In: 10:19:43 AM Scope Out: 10:42:53 AM Scope Withdrawal Time: 0 hours 12 minutes 39 seconds  Total Procedure Duration: 0 hours 23 minutes 10 seconds  Findings:                 Two sessile polyps were found in the ascending                            colon and cecum. The polyps were 3 to 6 mm in size.                            These polyps were removed with a cold snare.                            Resection and retrieval were complete.                           Multiple diverticula were found in the sigmoid                            colon and descending colon.                           A localized area of erythematous mucosa was found  in the sigmoid colon. This was biopsied with a cold                            forceps for microbiology.                           An 11 mm polyp was found in the sigmoid colon. The                            polyp was pedunculated. The polyp was removed with                            a hot snare. Resection and retrieval were complete.                           A 4 mm polyp was found in the sigmoid colon. The                            polyp was sessile. The polyp was removed with a                            cold snare. Resection and retrieval were complete.                           Non-bleeding internal hemorrhoids were found during                            retroflexion. Complications:            No immediate complications. Estimated Blood Loss:     Estimated blood loss was minimal. Impression:               - Two 3 to 6 mm polyps in  the ascending colon and                            in the cecum, removed with a cold snare. Resected                            and retrieved.                           - Diverticulosis in the sigmoid colon and in the                            descending colon.                           - Erythematous mucosa in the sigmoid colon.                            Biopsied.                           - One 11 mm polyp in the sigmoid colon, removed  with a hot snare. Resected and retrieved.                           - One 4 mm polyp in the sigmoid colon, removed with                            a cold snare. Resected and retrieved.                           - Non-bleeding internal hemorrhoids. Recommendation:           - Discharge patient to home (with escort).                           - Await pathology results.                           - Okay to restart your Eliquis  tomorrow.                           - Return to GI clinic in 2-3 months.                           - The findings and recommendations were discussed                            with the patient. Dr Pedro Bourgeois "[place]" [place],  09/09/2023 10:48:43 AM

## 2023-09-09 NOTE — Progress Notes (Signed)
 Sedate, gd SR, tolerated procedure well, VSS, report to RN

## 2023-09-09 NOTE — Progress Notes (Signed)
 Updated medical record  Last echo-dilatation of ascending aorta 40 mm

## 2023-09-09 NOTE — Progress Notes (Signed)
 Called to room to assist during endoscopic procedure.  Patient ID and intended procedure confirmed with present staff. Received instructions for my participation in the procedure from the performing physician.

## 2023-09-10 ENCOUNTER — Telehealth: Payer: Self-pay

## 2023-09-10 NOTE — Telephone Encounter (Signed)
  Follow up Call-     09/09/2023    9:45 AM  Call back number  Post procedure Call Back phone  # [phone]  Permission to leave phone message Yes     Patient questions:  Do you have a fever, pain , or abdominal swelling? No. Pain Score  0 *  Have you tolerated food without any problems? Yes.    Have you been able to return to your normal activities? Yes.    Do you have any questions about your discharge instructions: Diet   No. Medications  No. Follow up visit  No.  Do you have questions or concerns about your Care? No.  Actions: * If pain score is 4 or above: No action needed, pain <4.

## 2023-09-14 ENCOUNTER — Ambulatory Visit: Payer: Self-pay | Admitting: Internal Medicine

## 2023-09-14 LAB — SURGICAL PATHOLOGY

## 2023-09-15 ENCOUNTER — Ambulatory Visit: Admitting: Internal Medicine

## 2023-09-20 ENCOUNTER — Encounter (HOSPITAL_BASED_OUTPATIENT_CLINIC_OR_DEPARTMENT_OTHER): Payer: Self-pay | Admitting: Cardiology

## 2023-09-20 ENCOUNTER — Ambulatory Visit (HOSPITAL_BASED_OUTPATIENT_CLINIC_OR_DEPARTMENT_OTHER): Admitting: Cardiology

## 2023-09-20 VITALS — BP 128/80 | HR 63 | Ht 65.0 in | Wt 147.2 lb

## 2023-09-20 DIAGNOSIS — Z952 Presence of prosthetic heart valve: Secondary | ICD-10-CM

## 2023-09-20 DIAGNOSIS — I480 Paroxysmal atrial fibrillation: Secondary | ICD-10-CM

## 2023-09-20 DIAGNOSIS — I4891 Unspecified atrial fibrillation: Secondary | ICD-10-CM

## 2023-09-20 DIAGNOSIS — Z7901 Long term (current) use of anticoagulants: Secondary | ICD-10-CM

## 2023-09-20 DIAGNOSIS — I5032 Chronic diastolic (congestive) heart failure: Secondary | ICD-10-CM | POA: Diagnosis not present

## 2023-09-20 DIAGNOSIS — I10 Essential (primary) hypertension: Secondary | ICD-10-CM

## 2023-09-20 DIAGNOSIS — I71012 Dissection of descending thoracic aorta: Secondary | ICD-10-CM

## 2023-09-20 DIAGNOSIS — D6869 Other thrombophilia: Secondary | ICD-10-CM | POA: Diagnosis not present

## 2023-09-20 DIAGNOSIS — I2510 Atherosclerotic heart disease of native coronary artery without angina pectoris: Secondary | ICD-10-CM

## 2023-09-20 NOTE — Progress Notes (Signed)
 Cardiology Office Note:  .   Date:  09/20/2023  ID:  Marissa Powers, DOB [birth year], MRN [id number] PCP: Aldo Hun, MD  [facility] Providers Cardiologist:  Sheryle Donning, MD Electrophysiologist:  Lei Pump, MD {  History of Present Illness: .   Marissa Powers is a 83 y.o. 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 I met her 09/20/23. She has been followed by Dr. Felipe Horton, Dr. Julane Ny in advanced heart failure, Dr. Lawana Pray in EP, and Dr. Charlotte Cookey in vascular.  Today: Here with her daughter in law today. Headed to the beach today, worried about going into afib. Discussed management at length, when does she need to go to the ER vs. When can she manage on her own.  Concerned that ECG reads septal infarct, discussed at length today. Reviewed echo, cath results.  Has questions re medications, reviewed today at length. One item we discussed is the metoprolol , cutting back on the dose given the diastolic heart failure, once afib is managed. Also discussed potential change from hydrochlorothiazide  to spironolactone in the future if needed. Reviewed recommendations for management of diastolic heart failure.   Breathing with activity is better, able climb stairs.   Feels occasional PVCs  ROS: Denies chest pain, shortness of breath at rest or with normal exertion. No PND, orthopnea, LE edema or unexpected weight gain. No syncope. ROS otherwise negative except as noted.   Studies Reviewed: Aaron Aas    EKG:  EKG Interpretation Date/Time:  Monday September 20 2023 09:05:58 EDT Ventricular Rate:  63 PR Interval:  184 QRS Duration:  86 QT Interval:  428 QTC Calculation: 437 R Axis:   -30  Text Interpretation: Normal sinus rhythm Left axis deviation Septal infarct (cited on or before 13-Aug-2023) Confirmed by Sheryle Donning [phone]) on 09/20/2023  9:20:13 AM    Physical Exam:   VS:  BP 128/80 (BP Location: Right Arm, Patient Position: Sitting, Cuff Size: Normal)   Pulse 63   Ht 5\' 5"  (1.651 m)   Wt 147 lb 3.2 oz (66.8 kg)   SpO2 98%   BMI 24.50 kg/m    Wt Readings from Last 3 Encounters:  09/20/23 147 lb 3.2 oz (66.8 kg)  09/09/23 150 lb (68 kg)  09/01/23 150 lb (68 kg)    GEN: Well nourished, well developed in no acute distress HEENT: Normal, moist mucous membranes NECK: No JVD CARDIAC: regular rhythm, normal S1 and S2, no rubs or gallops. 1/6 systolic murmur. VASCULAR: Radial and DP pulses 2+ bilaterally. No carotid bruits RESPIRATORY:  Clear to auscultation without rales, wheezing or rhonchi  ABDOMEN: Soft, non-tender, non-distended MUSCULOSKELETAL:  Ambulates independently SKIN: Warm and dry, no edema NEUROLOGIC:  Alert and oriented x 3. No focal neuro deficits noted. PSYCHIATRIC:  Normal affect    ASSESSMENT AND PLAN: .    Chronic diastolic heart failure -seen by Dr. Bensimhon given dyspnea on exertion. CPX 08/2022 with excellent functional capacity -discussed at length today guideline recommended medications. On entresto , farxiga , tolerating. Discussed if needed in the future can change from hydrochlorothiazide  to spironolactone  Paroxysmal atrial fibrillation -chadsvasc=6, secondary hypercoagulable state, continue apixaban  -planned for ablation with Dr. Lawana Pray -on amiodarone  currently, in sinus rhythm -on metoprolol , gave instructions on when to go to ER, when she can take an extra metoprolol , signs/symptoms to watch for -long term post ablation, would stop amiodarone , try to cut back or eventually stop metoprolol  if  able given diastolic heart failure (avoid bradycardia)  Hypertension -well managed, continue meds as above  Severe AS s/p TAVR -valve functioning well -has antibiotics for dental prophylaxis  CAD Hyperlipidemia -no aspirin  as she is on apixaban  -continue repatha -medical management  recommended -no symptoms -reviewed red flag warning signs that need immediate medical attention  Descending aortic dissection, chronic -managed medically, serial CT followed by Dr. Charlotte Cookey, they will call and see if they need follow up  CV risk counseling and prevention -recommend heart healthy/Mediterranean diet, with whole grains, fruits, vegetable, fish, lean meats, nuts, and olive oil. Limit salt. -recommend moderate walking, 3-5 times/week for 30-50 minutes each session. Aim for at least 150 minutes.week. Goal should be pace of 3 miles/hours, or walking 1.5 miles in 30 minutes -recommend avoidance of tobacco products. Avoid excess alcohol .  Dispo: 6 mos  Total time of encounter: I spent 59 minutes dedicated to the care of this patient on the date of this encounter to include pre-visit review of records, face-to-face time with the patient discussing conditions above, and clinical documentation with the electronic health record. We specifically spent time today discussing diastolic heart failure, afib management and medication discussion at length, plus topics as noted above   Signed, Sheryle Donning, MD   Sheryle Donning, MD, PhD, [facility] [place]  [facility]  [place]  [facility] at [facility] at [facility] [address] [place], [place] [address] [phone]

## 2023-09-20 NOTE — Patient Instructions (Addendum)
 Medication Instructions:  Your physician recommends that you continue on your current medications as directed. Please refer to the Current Medication list given to you today.   If you have afib while you are at the beach: -If you are STABLE: blood pressure is ok, heart rate is elevated; continue your current amiodarone  dose and take an extra metoprolol  dose to try to keep heart rate <100. -If you are NOT stable (very short of breath, blood pressure is low, lightheaded, etc): go to ER to see if they can cardiovert you emergently.   Follow-Up: Please follow up in 6 months with Dr. Veryl Gottron, Slater Duncan, NP or Neomi Banks, NP

## 2023-09-22 ENCOUNTER — Telehealth: Payer: Self-pay | Admitting: Cardiology

## 2023-09-22 NOTE — Telephone Encounter (Signed)
 Pt c/o medication issue:  1. Name of Medication: metoprolol  succinate (TOPROL -XL) 25 MG 24 hr tablet   2. How are you currently taking this medication (dosage and times per day)? As written  3. Are you having a reaction (difficulty breathing--STAT)? No   4. What is your medication issue? Pts daughter would like to know if the pt should skip this medication when her HR is in the 50's. Pt daughter wasn't able to give much information due to not being with the pt at the moment.

## 2023-09-22 NOTE — Telephone Encounter (Signed)
 Called and spoke to daughter. Pt's full name and DOB verified. Discussed with daughter to continue Metoprolol  even if HR is in 38s.  If pt is awake and her HR is in 40s, hold Metoprolol . If pt is asleep and HR is in 40s, that is OK. Daughter verbalized understanding. [facility] will be sent to pt as well.

## 2023-10-17 ENCOUNTER — Other Ambulatory Visit: Payer: Self-pay | Admitting: Physician Assistant

## 2023-10-20 DIAGNOSIS — M353 Polymyalgia rheumatica: Secondary | ICD-10-CM | POA: Diagnosis not present

## 2023-10-20 DIAGNOSIS — M549 Dorsalgia, unspecified: Secondary | ICD-10-CM | POA: Diagnosis not present

## 2023-10-20 DIAGNOSIS — M316 Other giant cell arteritis: Secondary | ICD-10-CM | POA: Diagnosis not present

## 2023-10-20 DIAGNOSIS — Z7952 Long term (current) use of systemic steroids: Secondary | ICD-10-CM | POA: Diagnosis not present

## 2023-10-20 DIAGNOSIS — M8580 Other specified disorders of bone density and structure, unspecified site: Secondary | ICD-10-CM | POA: Diagnosis not present

## 2023-10-20 DIAGNOSIS — M1991 Primary osteoarthritis, unspecified site: Secondary | ICD-10-CM | POA: Diagnosis not present

## 2023-10-20 DIAGNOSIS — Z6824 Body mass index (BMI) 24.0-24.9, adult: Secondary | ICD-10-CM | POA: Diagnosis not present

## 2023-10-25 ENCOUNTER — Other Ambulatory Visit: Payer: Self-pay

## 2023-10-25 DIAGNOSIS — I71012 Dissection of descending thoracic aorta: Secondary | ICD-10-CM

## 2023-10-26 ENCOUNTER — Ambulatory Visit (HOSPITAL_BASED_OUTPATIENT_CLINIC_OR_DEPARTMENT_OTHER): Admitting: Cardiology

## 2023-11-08 ENCOUNTER — Ambulatory Visit: Payer: PPO

## 2023-11-12 ENCOUNTER — Ambulatory Visit

## 2023-11-18 DIAGNOSIS — D649 Anemia, unspecified: Secondary | ICD-10-CM | POA: Diagnosis not present

## 2023-11-18 DIAGNOSIS — E039 Hypothyroidism, unspecified: Secondary | ICD-10-CM | POA: Diagnosis not present

## 2023-11-18 DIAGNOSIS — I480 Paroxysmal atrial fibrillation: Secondary | ICD-10-CM | POA: Diagnosis not present

## 2023-11-18 DIAGNOSIS — I110 Hypertensive heart disease with heart failure: Secondary | ICD-10-CM | POA: Diagnosis not present

## 2023-11-18 DIAGNOSIS — R42 Dizziness and giddiness: Secondary | ICD-10-CM | POA: Diagnosis not present

## 2023-11-18 DIAGNOSIS — I951 Orthostatic hypotension: Secondary | ICD-10-CM | POA: Diagnosis not present

## 2023-11-18 DIAGNOSIS — R011 Cardiac murmur, unspecified: Secondary | ICD-10-CM | POA: Diagnosis not present

## 2023-11-18 DIAGNOSIS — I5032 Chronic diastolic (congestive) heart failure: Secondary | ICD-10-CM | POA: Diagnosis not present

## 2023-11-18 DIAGNOSIS — I71012 Dissection of descending thoracic aorta: Secondary | ICD-10-CM | POA: Diagnosis not present

## 2023-11-18 DIAGNOSIS — I2510 Atherosclerotic heart disease of native coronary artery without angina pectoris: Secondary | ICD-10-CM | POA: Diagnosis not present

## 2023-11-19 ENCOUNTER — Encounter: Payer: Self-pay | Admitting: Internal Medicine

## 2023-11-19 ENCOUNTER — Ambulatory Visit (HOSPITAL_COMMUNITY)
Admission: RE | Admit: 2023-11-19 | Discharge: 2023-11-19 | Disposition: A | Discharge status: Home / Self Care | Source: Ambulatory Visit | Attending: Cardiovascular Disease | Admitting: Cardiovascular Disease

## 2023-11-19 ENCOUNTER — Ambulatory Visit: Admitting: Internal Medicine

## 2023-11-19 ENCOUNTER — Ambulatory Visit

## 2023-11-19 ENCOUNTER — Ambulatory Visit (HOSPITAL_COMMUNITY)
Admission: RE | Admit: 2023-11-19 | Discharge: 2023-11-19 | Disposition: A | Discharge status: Home / Self Care | Source: Ambulatory Visit | Attending: Surgery | Admitting: Surgery

## 2023-11-19 VITALS — BP 111/55 | HR 54 | Temp 97.5°F | Resp 14 | Ht 65.0 in | Wt 150.8 lb

## 2023-11-19 VITALS — BP 110/60 | HR 49 | Ht 65.0 in | Wt 149.0 lb

## 2023-11-19 DIAGNOSIS — I701 Atherosclerosis of renal artery: Secondary | ICD-10-CM | POA: Insufficient documentation

## 2023-11-19 DIAGNOSIS — K429 Umbilical hernia without obstruction or gangrene: Secondary | ICD-10-CM | POA: Insufficient documentation

## 2023-11-19 DIAGNOSIS — I4891 Unspecified atrial fibrillation: Secondary | ICD-10-CM | POA: Insufficient documentation

## 2023-11-19 DIAGNOSIS — Z923 Personal history of irradiation: Secondary | ICD-10-CM | POA: Insufficient documentation

## 2023-11-19 DIAGNOSIS — R197 Diarrhea, unspecified: Secondary | ICD-10-CM | POA: Diagnosis not present

## 2023-11-19 DIAGNOSIS — K5900 Constipation, unspecified: Secondary | ICD-10-CM | POA: Diagnosis not present

## 2023-11-19 DIAGNOSIS — M4317 Spondylolisthesis, lumbosacral region: Secondary | ICD-10-CM | POA: Diagnosis not present

## 2023-11-19 DIAGNOSIS — R110 Nausea: Secondary | ICD-10-CM | POA: Diagnosis not present

## 2023-11-19 DIAGNOSIS — M47816 Spondylosis without myelopathy or radiculopathy, lumbar region: Secondary | ICD-10-CM | POA: Insufficient documentation

## 2023-11-19 DIAGNOSIS — R140 Abdominal distension (gaseous): Secondary | ICD-10-CM

## 2023-11-19 DIAGNOSIS — M810 Age-related osteoporosis without current pathological fracture: Secondary | ICD-10-CM | POA: Diagnosis not present

## 2023-11-19 DIAGNOSIS — K5790 Diverticulosis of intestine, part unspecified, without perforation or abscess without bleeding: Secondary | ICD-10-CM | POA: Insufficient documentation

## 2023-11-19 DIAGNOSIS — I700 Atherosclerosis of aorta: Secondary | ICD-10-CM | POA: Insufficient documentation

## 2023-11-19 DIAGNOSIS — I7409 Other arterial embolism and thrombosis of abdominal aorta: Secondary | ICD-10-CM | POA: Diagnosis not present

## 2023-11-19 DIAGNOSIS — M47817 Spondylosis without myelopathy or radiculopathy, lumbosacral region: Secondary | ICD-10-CM | POA: Diagnosis not present

## 2023-11-19 DIAGNOSIS — R918 Other nonspecific abnormal finding of lung field: Secondary | ICD-10-CM | POA: Insufficient documentation

## 2023-11-19 DIAGNOSIS — D259 Leiomyoma of uterus, unspecified: Secondary | ICD-10-CM | POA: Diagnosis not present

## 2023-11-19 DIAGNOSIS — Z853 Personal history of malignant neoplasm of breast: Secondary | ICD-10-CM | POA: Diagnosis not present

## 2023-11-19 DIAGNOSIS — I2510 Atherosclerotic heart disease of native coronary artery without angina pectoris: Secondary | ICD-10-CM | POA: Diagnosis not present

## 2023-11-19 DIAGNOSIS — Z952 Presence of prosthetic heart valve: Secondary | ICD-10-CM | POA: Insufficient documentation

## 2023-11-19 DIAGNOSIS — I71012 Dissection of descending thoracic aorta: Secondary | ICD-10-CM | POA: Insufficient documentation

## 2023-11-19 DIAGNOSIS — K649 Unspecified hemorrhoids: Secondary | ICD-10-CM

## 2023-11-19 DIAGNOSIS — M4316 Spondylolisthesis, lumbar region: Secondary | ICD-10-CM | POA: Diagnosis not present

## 2023-11-19 DIAGNOSIS — K219 Gastro-esophageal reflux disease without esophagitis: Secondary | ICD-10-CM

## 2023-11-19 DIAGNOSIS — R1030 Lower abdominal pain, unspecified: Secondary | ICD-10-CM

## 2023-11-19 DIAGNOSIS — K5730 Diverticulosis of large intestine without perforation or abscess without bleeding: Secondary | ICD-10-CM | POA: Diagnosis not present

## 2023-11-19 LAB — CBC

## 2023-11-19 MED ORDER — CIPROFLOXACIN HCL 500 MG PO TABS: 500.0000 mg | ORAL_TABLET | Freq: Two times a day (BID) | ORAL | 0 refills | Status: DC

## 2023-11-19 MED ORDER — DENOSUMAB 60 MG/ML ~~LOC~~ SOSY
60.0000 mg | PREFILLED_SYRINGE | Freq: Once | SUBCUTANEOUS | Status: AC
  Administered 2023-11-19: 60 mg via SUBCUTANEOUS
  Filled 2023-11-19: qty 1

## 2023-11-19 MED ORDER — LUBIPROSTONE 8 MCG PO CAPS: 8.0000 ug | ORAL_CAPSULE | Freq: Two times a day (BID) | ORAL | 3 refills | Status: AC

## 2023-11-19 MED ORDER — HYDROCORTISONE (PERIANAL) 2.5 % EX CREA: 1.0000 | TOPICAL_CREAM | Freq: Two times a day (BID) | CUTANEOUS | 1 refills | Status: AC

## 2023-11-19 MED ORDER — METRONIDAZOLE 500 MG PO TABS: 500.0000 mg | ORAL_TABLET | Freq: Two times a day (BID) | ORAL | 0 refills | Status: DC

## 2023-11-19 MED ORDER — IOHEXOL 350 MG/ML SOLN
100.0000 mL | Freq: Once | INTRAVENOUS | Status: AC | PRN
  Administered 2023-11-19: 80 mL via INTRAVENOUS

## 2023-11-19 NOTE — Progress Notes (Signed)
 Chief Complaint: Diarrhea, ab pain, and nausea  HPI : 83 year old female with history of A-fib on Eliquis , prior diverticulitis, CAD, GERD, PMR, aortic stenosis s/p TAVR, breast cancer in remission, arthritis presents for follow-up of ab pain and irregular bowel habits  Patient did not have significant benefit in her nausea from taking pantoprazole  once daily  Interval History: She still has to sit on the toilet for 45 minutes at a time in order to get BMs out. This leads to rectal bleeding and worsening of her hemorrhoids. Foods don't seem to affect her bowel habits.  She has done a few sessions of pelvic floor PT but has not noticed any significant benefit from this.  Patient has backed off on many of her GI medications.  Patient does still have significant issues with gas.  She was instructed to use Gas-X as needed.  She has been spending a lot of time at the beach and has been doing water aerobics every day.  Wt Readings from Last 3 Encounters:  11/19/23 150 lb 12.8 oz (68.4 kg)  11/19/23 149 lb (67.6 kg)  09/20/23 147 lb 3.2 oz (66.8 kg)    Past Medical History:  Diagnosis Date   Arthritis    osteoarthritis   Breast cancer (HCC) 11/2021   left   CAD (coronary artery disease)    Descending thoracic aortic dissection (HCC)    followed by Dr. Serene (11/24/21)   Diverticulitis 2022   Dysplasia of cervix, low grade (CIN 1) 1990   HPV, Cryo, Laser   Fibroadenoma    Left, at 5 o'clock, not excised    Fibroid 2002   1 cm, 2 cm   GERD (gastroesophageal reflux disease)    Hypercholesteremia    Hypertension    Hyperthyroidism    PAF (paroxysmal atrial fibrillation) (HCC)    on Eliquis    PMR (polymyalgia rheumatica) (HCC)    Pneumonia    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   s/p TAVR with a 23 mm Edwards S3UR via the TF approach by Dr. Verlin & Dr. Lucas .   Severe aortic stenosis      Past Surgical History:  Procedure Laterality Date   AXILLARY  SENTINEL NODE BIOPSY Left 12/29/2021   Procedure: LEFT AXILLARY SENTINEL NODE BIOPSY;  Surgeon: Ebbie Cough, MD;  Location: [facility] OR;  Service: General;  Laterality: Left;   BREAST BIOPSY Bilateral 1970's   x2 one a side   BREAST LUMPECTOMY WITH RADIOACTIVE SEED LOCALIZATION Left 12/29/2021   Procedure: BREAST LUMPECTOMY WITH RADIOACTIVE SEED LOCALIZATION X2;  Surgeon: Ebbie Cough, MD;  Location: [facility] OR;  Service: General;  Laterality: Left;   CATARACT EXTRACTION Bilateral    bilateral   COLONOSCOPY     implantable loop recorder removal  05/30/2021   MDT reveal LINQ removed by Dr Kelsie   INTRAOPERATIVE TRANSTHORACIC ECHOCARDIOGRAM N/A 08/19/2021   Procedure: INTRAOPERATIVE TRANSTHORACIC ECHOCARDIOGRAM;  Surgeon: Verlin Lonni BIRCH, MD;  Location: [facility] OR;  Service: Open Heart Surgery;  Laterality: N/A;   LOOP RECORDER INSERTION N/A 08/11/2017   Procedure: LOOP RECORDER INSERTION;  Surgeon: Kelsie Agent, MD;  Location: [facility] INVASIVE CV LAB;  Service: Cardiovascular;  Laterality: N/A;   RADIOACTIVE SEED GUIDED AXILLARY SENTINEL LYMPH NODE Left 12/29/2021   Procedure: LEFT AXILLARY NODE SEED GUIDED EXCISION;  Surgeon: Ebbie Cough, MD;  Location: [facility] OR;  Service: General;  Laterality: Left;   RETINAL DETACHMENT SURGERY  2000   RIGHT/LEFT HEART CATH AND CORONARY ANGIOGRAPHY N/A 06/19/2021  Procedure: RIGHT/LEFT HEART CATH AND CORONARY ANGIOGRAPHY;  Surgeon: Claudene Victory ORN, MD;  Location: [facility] INVASIVE CV LAB;  Service: Cardiovascular;  Laterality: N/A;   THYROIDECTOMY  1967   TONSILLECTOMY  1950   TOTAL KNEE ARTHROPLASTY  04/25/2012   Procedure: TOTAL KNEE BILATERAL;  Surgeon: Dempsey LULLA Moan, MD;  Location: [facility] ORS;  Service: Orthopedics;  Laterality: Bilateral;   TRANSCATHETER AORTIC VALVE REPLACEMENT, TRANSFEMORAL N/A 08/19/2021   Procedure: Transcatheter Aortic Valve Replacement, Transfemoral Using 23mm Sapien 3 Ultra Edwards Valve;  Surgeon: Verlin Lonni BIRCH, MD;   Location: [facility] OR;  Service: Open Heart Surgery;  Laterality: N/A;   Family History  Problem Relation Age of Onset   AAA (abdominal aortic aneurysm) Mother 72   Healthy Father    Heart disease Brother        stints   Breast cancer Maternal Aunt 59   Breast cancer Maternal Grandmother 76   Heart attack Maternal Grandmother    Hypertension Son    Hypertension Son    Colon cancer Neg Hx    Colon polyps Neg Hx    Esophageal cancer Neg Hx    Stomach cancer Neg Hx    Rectal cancer Neg Hx    Social History   Tobacco Use   Smoking status: Never    Passive exposure: Never   Smokeless tobacco: Never  Vaping Use   Vaping status: Never Used  Substance Use Topics   Alcohol  use: Yes    Alcohol /week: 4.0 - 6.0 standard drinks of alcohol     Types: 4 - 6 Standard drinks or equivalent per week    Comment: once a week   Drug use: No   Current Outpatient Medications  Medication Sig Dispense Refill   ACETAMINOPHEN  PO Take 650 mg by mouth as needed for moderate pain (pain score 4-6).     amiodarone  (PACERONE ) 200 MG tablet Take 200 mg by mouth daily.     amoxicillin  (AMOXIL ) 500 MG tablet Take 4 tablets by mouth 1 hour prior to dental work     b complex vitamins capsule Take 1 capsule by mouth daily.     Calcium  Carb-Cholecalciferol  (CALCIUM  + D3 PO) Take 1 tablet by mouth in the morning.     cholecalciferol  (VITAMIN D3) 25 MCG (1000 UNIT) tablet Take 1,000 Units by mouth in the morning.     ciprofloxacin  (CIPRO ) 500 MG tablet Take 1 tablet (500 mg total) by mouth 2 (two) times daily. 20 tablet 0   dapagliflozin  propanediol (FARXIGA ) 10 MG TABS tablet TAKE 1 TABLET(10 MG) BY MOUTH DAILY BEFORE BREAKFAST 30 tablet 11   denosumab  (PROLIA ) 60 MG/ML SOSY injection Inject 60 mg into the skin every 6 (six) months.     ELIQUIS  5 MG TABS tablet TAKE 1 TABLET(5 MG) BY MOUTH TWICE DAILY 60 tablet 2   folic acid  (FOLVITE ) 1 MG tablet Take 2 mg by mouth in the morning.     hydrochlorothiazide  (MICROZIDE )  12.5 MG capsule Take 12.5 mg by mouth daily.     hydrocortisone  (ANUSOL -HC) 2.5 % rectal cream Place 1 Application rectally 2 (two) times daily. 30 g 1   letrozole  (FEMARA ) 2.5 MG tablet TAKE 1 TABLET(2.5 MG) BY MOUTH DAILY 90 tablet 3   levothyroxine  (SYNTHROID ) 50 MCG tablet Take 50 mcg by mouth daily before breakfast.     loperamide (IMODIUM) 2 MG capsule Take 2-4 mg by mouth 4 (four) times daily as needed (upset stomach/diarrhea/loose stools).     lubiprostone  (AMITIZA ) 8 MCG  capsule Take 1 capsule (8 mcg total) by mouth 2 (two) times daily with a meal. 30 capsule 3   metoprolol  succinate (TOPROL -XL) 25 MG 24 hr tablet Take 25 mg by mouth 2 (two) times daily.     metroNIDAZOLE  (FLAGYL ) 500 MG tablet Take 1 tablet (500 mg total) by mouth 2 (two) times daily. 20 tablet 0   nitroGLYCERIN  (NITROSTAT ) 0.4 MG SL tablet Place 1 tablet (0.4 mg total) under the tongue every 5 (five) minutes as needed for chest pain. 25 tablet 3   ondansetron  (ZOFRAN -ODT) 4 MG disintegrating tablet Take 4 mg by mouth every 8 (eight) hours as needed.     predniSONE  (DELTASONE ) 1 MG tablet Take 1 mg by mouth daily with breakfast.     REPATHA SURECLICK 140 MG/ML SOAJ Inject 140 mg into the skin every 14 (fourteen) days.     sacubitril-valsartan (ENTRESTO ) 49-51 MG Take 1 tablet by mouth 2 (two) times daily. 60 tablet 11   zolpidem  (AMBIEN ) 5 MG tablet Take 2.5 mg by mouth at bedtime.     No current facility-administered medications for this visit.   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 Hives   Bee Venom Other (See Comments)    Other Reaction(s): red/swelling   Physical Exam: BP 110/60   Pulse (!) 49   Ht 5' 5 (1.651 m)   Wt 149 lb (67.6 kg)   BMI 24.79 kg/m  Constitutional: Pleasant,well-developed, female in no acute distress. HEENT: Normocephalic and atraumatic. Conjunctivae  are normal. No scleral icterus. Cardiovascular: Normal rate, regular rhythm.  Pulmonary/chest: Effort normal and breath sounds normal. No wheezing, rales or rhonchi. Abdominal: Soft, nondistended, nontender. Bowel sounds active throughout. There are no masses palpable. No hepatomegaly. Extremities: No edema Neurological: Alert and oriented to person place and time. Skin: Skin is warm and dry. No rashes noted. Psychiatric: Normal mood and affect. Behavior is normal.  Labs 02/2023: CMP nml.  TSH normal.  Labs 06/2023: CBC unremarkable.  BMP unremarkable.  BNP mildly elevated.  INR mildly elevated at 1.4. TTG IgA negative. IgA nml. TSH low at 0.28. Alpha gal negative. CRP nml.   Labs 07/2023: CBC and CMP unremarkable.  CTA A/P w/contrast 11/07/21: IMPRESSION: VASCULAR 1. Small focal dissection of the abdominal aorta which begins at the level of the SMA and terminates above the bilateral renal arteries. 2. See separately dictated same day CT of the chest for discussion of findings above the diaphragm. NON-VASCULAR 1. Mild soft tissue stranding is seen about the sigmoid colon, decreased when compared with prior CT of the abdomen and pelvis, compatible with resolving acute diverticulitis.  Colonoscopy 09/09/23: - Two sessile polyps were found in the ascending colon and cecum. The polyps were 3 to 6 mm in size. These polyps were removed with a cold snare. Resection and retrieval were complete. - Multiple diverticula were found in the sigmoid colon and descending colon. - A localized area of erythematous mucosa was found in the sigmoid colon. This was biopsied with a cold forceps. - An 11 mm polyp was found in the sigmoid colon. The polyp was pedunculated. The polyp was removed with a hot snare. Resection and retrieval were complete. - A 4 mm polyp was found in the sigmoid colon. The polyp was sessile. The polyp was removed with a cold snare. Resection and retrieval were complete. - Non- bleeding  internal  hemorrhoids were found during retroflexion. Path: 1. Surgical [P], colon, sigmoid, polyp (2) :      -TUBULAR ADENOMAS, ONE FRAGMENT WITH STALK (STALK MARGIN NEGATIVE FOR      ADENOMATOUS CHANGE).      -NO HIGH-GRADE DYSPLASIA OR MALIGNANCY      2. Surgical [P], colon, ascending and cecum, polyp (2) :      -TUBULAR ADENOMA, NEGATIVE FOR HIGH-GRADE DYSPLASIA.      -SERRATED MUCOSAL POLYP, SMALL, WITH MILD BASAL CRYPT GROWTH ABNORMALITY, CANNOT      EXCLUDE SESSILE SERRATED POLYP/LESION (SSP/SSL), NEGATIVE FOR DYSPLASIA.      3. Surgical [P], colon, sigmoid :      - COLONIC MUCOSA WITH NO FEATURES DIAGNOSTIC FOR A SPECIFIC TYPE OF MUCOSAL POLYP.      - NEGATIVE FOR ADENOMATOUS CHANGE AND POLYPOID EPITHELIAL SERRATION.      - NEGATIVE FOR MALIGNANCY.   ASSESSMENT AND PLAN:  Alternating constipation and diarrhea, difficulty passing stools Lower ab pain Nausea Hemorrhoids Patient unfortunately has not seen any significant benefit from pelvic floor physical therapy.  She still has to sit on the toilet for prolonged periods of time in order to help induce a bowel movement.  I do query whether or not the patient may have an underlying rectocele that may be contributing to her difficulty with defecation.  Thus we will plan to proceed with MR defecography to try to get a better view of any underlying anatomic abnormalities that could be contributing to her difficulty with passing stools.  Will also start the patient on some Amitiza  to see if her prescription constipation medication will make it easier for her to pass BMs.  Patient did have some concern about fecal urgency on Amitiza , but she is open to trying it after she finishes her water aerobics this summer.  Patient does already have a upcoming CT planned so we will follow-up the results of this as well.  On her most recent colonoscopy she was noted to have some erythema near area of diverticuli.  Although her colon biopsies came back as  negative for overt SCAD, I do still have suspicion for this issue.  Thus we will go ahead and start her on empiric course of ciprofloxacin  and Flagyl  to see if she would benefit from this treatment.  Will also plan for SIBO breath test since patient describes continued significant issues with gas.  For her issues with rectal bleeding due to hemorrhoids, we will start her on some Anusol  HC cream.  If this is not effective, then could consider hemorrhoidal banding in the future. - Will follow up the results of her CTA C/A/P w/contrast  - Start empiric course of ciprofloxacin  500 mg BID and Flagyl  500 mg BID for 10 days for possible SCAD - SIBO breath test - Gas-X PRN - MRI defecography - Start Amitiza  8 mcg BID - Anusol  HC cream BID for 7 days - RTC 3 months  Estefana Kidney, MD  I spent 43 minutes of time, including in depth chart review, independent review of results as outlined above, communicating results with the patient directly, face-to-face time with the patient, coordinating care, ordering studies and medications as appropriate, and documentation.

## 2023-11-19 NOTE — Patient Instructions (Addendum)
 We have sent the following medications to your pharmacy for you to pick up at your convenience: Amitiza  8 mcg , Ciprofloxacin  500 mg twice daily for 10 days, Flagyl  500 mg twice daily for 10 days.  Please call [facility]  to schedule MRI  [phone]  _______________________________________________________  If your blood pressure at your visit was 140/90 or greater, please contact your primary care physician to follow up on this.  _______________________________________________________  If you are age 82 or older, your body mass index should be between 23-30. Your Body mass index is 24.79 kg/m. If this is out of the aforementioned range listed, please consider follow up with your Primary Care Provider.  If you are age 4 or younger, your body mass index should be between 19-25. Your Body mass index is 24.79 kg/m. If this is out of the aformentioned range listed, please consider follow up with your Primary Care Provider.   ________________________________________________________  The [facility] providers would like to encourage you to use MYCHART to communicate with providers for non-urgent requests or questions.  Due to long hold times on the telephone, sending your provider a message by [facility] may be a faster and more efficient way to get a response.  Please allow 48 business hours for a response.  Please remember that this is for non-urgent requests.  _______________________________________________________  [facility] is using a team-based approach to care.  Your team is made up of your doctor and two to three APPS. Our APPS (Nurse Practitioners and Physician Assistants) work with your physician to ensure care continuity for you. They are fully qualified to address your health concerns and develop a treatment plan. They communicate directly with your gastroenterologist to care for you. Seeing the Advanced Practice Practitioners on your physician's team can help you by  facilitating care more promptly, often allowing for earlier appointments, access to diagnostic testing, procedures, and other specialty referrals.    It was a pleasure to see you today!  Thank you for trusting me with your gastrointestinal care!

## 2023-11-19 NOTE — Progress Notes (Signed)
 Diagnosis: Osteoporosis  Provider:  Chilton Greathouse MD  Procedure: Injection  Prolia (Denosumab), Dose: 60 mg, Site: subcutaneous, Number of injections: 1  Injection Site(s): Right arm  Post Care: Patient declined observation  Discharge: Condition: Good, Destination: Home . AVS Provided  Performed by:  Adriana Mccallum, RN

## 2023-11-20 LAB — BASIC METABOLIC PANEL WITH GFR
BUN/Creatinine Ratio: 17 (ref 12–28)
BUN: 19 mg/dL (ref 8–27)
CO2: 23 mmol/L (ref 20–29)
Calcium: 9.5 mg/dL (ref 8.7–10.3)
Chloride: 100 mmol/L (ref 96–106)
Creatinine, Ser: 1.11 mg/dL — ABNORMAL HIGH (ref 0.57–1.00)
Glucose: 84 mg/dL (ref 70–99)
Potassium: 4.1 mmol/L (ref 3.5–5.2)
Sodium: 141 mmol/L (ref 134–144)
eGFR: 50 mL/min/1.73 — ABNORMAL LOW (ref >59)

## 2023-11-20 LAB — CBC
Hematocrit: 35 % (ref 34.0–46.6)
Hemoglobin: 11.5 g/dL (ref 11.1–15.9)
MCH: 35 pg — ABNORMAL HIGH (ref 26.6–33.0)
MCHC: 32.9 g/dL (ref 31.5–35.7)
MCV: 106 fL — ABNORMAL HIGH (ref 79–97)
Platelets: 184 x10E3/uL (ref 150–450)
RBC: 3.29 x10E6/uL — ABNORMAL LOW (ref 3.77–5.28)
RDW: 15.5 % — ABNORMAL HIGH (ref 11.7–15.4)
WBC: 6.2 x10E3/uL (ref 3.4–10.8)

## 2023-11-20 LAB — BUN+CREAT
BUN/Creatinine Ratio: 18 (ref 12–28)
BUN: 20 mg/dL (ref 8–27)
Creatinine, Ser: 1.14 mg/dL — ABNORMAL HIGH (ref 0.57–1.00)
eGFR: 48 mL/min/1.73 — ABNORMAL LOW (ref >59)

## 2023-11-24 ENCOUNTER — Ambulatory Visit: Payer: Self-pay | Admitting: Cardiology

## 2023-12-06 ENCOUNTER — Telehealth: Payer: Self-pay | Admitting: Internal Medicine

## 2023-12-06 ENCOUNTER — Ambulatory Visit: Admitting: Surgery

## 2023-12-06 NOTE — Telephone Encounter (Signed)
 Pt stated that she feels that her stomach is off and that she has been having slight nausea Pt stated that she thinks it may be coming from her recent medication that she started. Pt stated that she has recently started taking the Cipro , Flagyl ,and Amitiza . Pt stated that she has two days left of her antibiotics. Pt was notified that these could be side effects form the antibiotics.,  Pt was notified to try and finish the antibiotics as prescribed and take her prescribed Zofran  as needed.  Pt to call back if symptoms worsen.  Pt verbalized understanding with all questions answered.

## 2023-12-06 NOTE — Telephone Encounter (Signed)
 Received call from patient, states she was prescribed 3 medications,Is not feeling well, and would like a nurse call to discuss solutions or scheduling. Please review and advise  Thank you

## 2023-12-10 ENCOUNTER — Encounter (HOSPITAL_COMMUNITY): Payer: Self-pay

## 2023-12-14 ENCOUNTER — Encounter: Payer: Self-pay | Admitting: Cardiology

## 2023-12-14 ENCOUNTER — Encounter (HOSPITAL_BASED_OUTPATIENT_CLINIC_OR_DEPARTMENT_OTHER): Payer: Self-pay

## 2023-12-16 NOTE — Pre-Procedure Instructions (Signed)
 Attempted to call patient regarding procedure instructions.  Left voicemail on the following items: Arrival time 0800 Nothing to eat or drink after midnight No meds AM of procedure Responsible person to drive you home and stay with you for 24 hrs  Have you missed any doses of anti-coagulant Eliquis- should be taken twice a day, if you have missed any doses please let us know.  Don't take dose morning of procedure.

## 2023-12-17 ENCOUNTER — Encounter (HOSPITAL_COMMUNITY)
Admission: RE | Disposition: A | Discharge status: Home / Self Care | Payer: Self-pay | Source: Home / Self Care | Attending: Cardiology

## 2023-12-17 ENCOUNTER — Ambulatory Visit (HOSPITAL_COMMUNITY): Admitting: Anesthesiology

## 2023-12-17 ENCOUNTER — Other Ambulatory Visit: Payer: Self-pay

## 2023-12-17 ENCOUNTER — Ambulatory Visit (HOSPITAL_COMMUNITY)
Admission: RE | Admit: 2023-12-17 | Discharge: 2023-12-17 | Disposition: A | Discharge status: Home / Self Care | Attending: Cardiology | Admitting: Cardiology

## 2023-12-17 DIAGNOSIS — K219 Gastro-esophageal reflux disease without esophagitis: Secondary | ICD-10-CM | POA: Diagnosis not present

## 2023-12-17 DIAGNOSIS — E039 Hypothyroidism, unspecified: Secondary | ICD-10-CM | POA: Diagnosis not present

## 2023-12-17 DIAGNOSIS — I4892 Unspecified atrial flutter: Secondary | ICD-10-CM

## 2023-12-17 DIAGNOSIS — I4891 Unspecified atrial fibrillation: Secondary | ICD-10-CM | POA: Diagnosis not present

## 2023-12-17 DIAGNOSIS — Z952 Presence of prosthetic heart valve: Secondary | ICD-10-CM | POA: Diagnosis not present

## 2023-12-17 DIAGNOSIS — I739 Peripheral vascular disease, unspecified: Secondary | ICD-10-CM | POA: Diagnosis not present

## 2023-12-17 DIAGNOSIS — I10 Essential (primary) hypertension: Secondary | ICD-10-CM

## 2023-12-17 DIAGNOSIS — I2510 Atherosclerotic heart disease of native coronary artery without angina pectoris: Secondary | ICD-10-CM | POA: Diagnosis not present

## 2023-12-17 DIAGNOSIS — I480 Paroxysmal atrial fibrillation: Secondary | ICD-10-CM | POA: Diagnosis not present

## 2023-12-17 DIAGNOSIS — I4819 Other persistent atrial fibrillation: Secondary | ICD-10-CM | POA: Diagnosis not present

## 2023-12-17 HISTORY — PX: ATRIAL FIBRILLATION ABLATION: EP1191

## 2023-12-17 LAB — POCT ACTIVATED CLOTTING TIME: Activated Clotting Time: 441 s

## 2023-12-17 MED ORDER — ATROPINE SULFATE 1 MG/10ML IJ SOSY
PREFILLED_SYRINGE | INTRAMUSCULAR | Status: AC
  Filled 2023-12-17: qty 10

## 2023-12-17 MED ORDER — LIDOCAINE 2% (20 MG/ML) 5 ML SYRINGE
INTRAMUSCULAR | Status: DC | PRN
  Administered 2023-12-17: 50 mg via INTRAVENOUS

## 2023-12-17 MED ORDER — ONDANSETRON HCL 4 MG/2ML IJ SOLN
INTRAMUSCULAR | Status: DC | PRN
  Administered 2023-12-17: 4 mg via INTRAVENOUS

## 2023-12-17 MED ORDER — DEXAMETHASONE SODIUM PHOSPHATE 10 MG/ML IJ SOLN
INTRAMUSCULAR | Status: DC | PRN
  Administered 2023-12-17: 5 mg via INTRAVENOUS

## 2023-12-17 MED ORDER — FENTANYL CITRATE (PF) 100 MCG/2ML IJ SOLN
INTRAMUSCULAR | Status: AC
Start: 2023-12-17 — End: 2023-12-17
  Filled 2023-12-17: qty 2

## 2023-12-17 MED ORDER — SUGAMMADEX SODIUM 200 MG/2ML IV SOLN
INTRAVENOUS | Status: DC | PRN
  Administered 2023-12-17: 200 mg via INTRAVENOUS

## 2023-12-17 MED ORDER — PROPOFOL 10 MG/ML IV BOLUS
INTRAVENOUS | Status: DC | PRN
  Administered 2023-12-17: 130 mg via INTRAVENOUS

## 2023-12-17 MED ORDER — EPHEDRINE SULFATE-NACL 50-0.9 MG/10ML-% IV SOSY
PREFILLED_SYRINGE | INTRAVENOUS | Status: DC | PRN
  Administered 2023-12-17: 5 mg via INTRAVENOUS

## 2023-12-17 MED ORDER — HEPARIN (PORCINE) IN NACL 1000-0.9 UT/500ML-% IV SOLN
INTRAVENOUS | Status: DC | PRN
  Administered 2023-12-17 (×2): 500 mL

## 2023-12-17 MED ORDER — HEPARIN SODIUM (PORCINE) 1000 UNIT/ML IJ SOLN
INTRAMUSCULAR | Status: DC | PRN
  Administered 2023-12-17: 14000 [IU] via INTRAVENOUS

## 2023-12-17 MED ORDER — PROTAMINE SULFATE 10 MG/ML IV SOLN
INTRAVENOUS | Status: DC | PRN
  Administered 2023-12-17: 40 mg via INTRAVENOUS

## 2023-12-17 MED ORDER — PHENYLEPHRINE HCL-NACL 20-0.9 MG/250ML-% IV SOLN
INTRAVENOUS | Status: DC | PRN
  Administered 2023-12-17: 25 ug/min via INTRAVENOUS

## 2023-12-17 MED ORDER — SODIUM CHLORIDE 0.9 % IV SOLN: INTRAVENOUS | Status: DC

## 2023-12-17 MED ORDER — ROCURONIUM BROMIDE 10 MG/ML (PF) SYRINGE
PREFILLED_SYRINGE | INTRAVENOUS | Status: DC | PRN
  Administered 2023-12-17: 10 mg via INTRAVENOUS
  Administered 2023-12-17: 40 mg via INTRAVENOUS

## 2023-12-17 MED ORDER — FENTANYL CITRATE (PF) 100 MCG/2ML IJ SOLN
INTRAMUSCULAR | Status: DC | PRN
  Administered 2023-12-17 (×2): 50 ug via INTRAVENOUS

## 2023-12-17 MED ORDER — ATROPINE SULFATE 1 MG/10ML IJ SOSY
PREFILLED_SYRINGE | INTRAMUSCULAR | Status: DC | PRN
  Administered 2023-12-17: 1 mg via INTRAVENOUS

## 2023-12-17 NOTE — Anesthesia Postprocedure Evaluation (Signed)
 Anesthesia Post Note  Patient: Marissa Powers  Procedure(s) Performed: ATRIAL FIBRILLATION ABLATION     Patient location during evaluation: PACU Anesthesia Type: General Level of consciousness: awake Pain management: pain level controlled Vital Signs Assessment: post-procedure vital signs reviewed and stable Respiratory status: spontaneous breathing, nonlabored ventilation and respiratory function stable Cardiovascular status: blood pressure returned to baseline and stable Postop Assessment: no apparent nausea or vomiting Anesthetic complications: no   There were no known notable events for this encounter.  Last Vitals:  Vitals:   12/17/23 1200 12/17/23 1215  BP: (!) 116/58 (!) 118/52  Pulse: (!) 53 (!) 51  Resp: 13 18  Temp:    SpO2: 94% 93%    Last Pain:  Vitals:   12/17/23 1200  TempSrc:   PainSc: 0-No pain                 Delon Aisha Arch

## 2023-12-17 NOTE — Discharge Instructions (Signed)

## 2023-12-17 NOTE — Anesthesia Preprocedure Evaluation (Signed)
 Anesthesia Evaluation  Patient identified by MRN, date of birth, ID band Patient awake    Reviewed: Allergy & Precautions, NPO status , Patient's Chart, lab work & pertinent test results  Airway Mallampati: II  TM Distance: >3 FB Neck ROM: Full    Dental   Pulmonary neg pulmonary ROS   breath sounds clear to auscultation       Cardiovascular hypertension, Pt. on medications + CAD and + Peripheral Vascular Disease  + Valvular Problems/Murmurs  Rhythm:Regular Rate:Normal  S/p TAVR Chronic descending aortic dissection.   Neuro/Psych negative neurological ROS     GI/Hepatic Neg liver ROS,GERD  ,,  Endo/Other  Hypothyroidism    Renal/GU negative Renal ROS     Musculoskeletal  (+) Arthritis ,    Abdominal   Peds  Hematology negative hematology ROS (+)   Anesthesia Other Findings   Reproductive/Obstetrics                             IMPRESSIONS     1. Left ventricular ejection fraction, by estimation, is 60 to 65%. The  left ventricle has normal function. The left ventricle has no regional  wall motion abnormalities. Left ventricular diastolic parameters are  indeterminate.   2. Right ventricular systolic function is normal. The right ventricular  size is normal. There is normal pulmonary artery systolic pressure. The  estimated right ventricular systolic pressure is 24.2 mmHg.   3. The mitral valve is normal in structure. Trivial mitral valve  regurgitation.   4. The aortic valve has been repaired/replaced. Aortic valve  regurgitation is not visualized. There is a 23 mm Edwards Sapien  prosthetic (TAVR) valve present in the aortic position. Echo findings are  consistent with normal structure and function of the  aortic valve prosthesis. Aortic valve mean gradient measures 12.0 mmHg.   5. Aortic dilatation noted. There is dilatation of the ascending aorta,  measuring 40 mm.   6. The  inferior vena cava is normal in size with greater than 50%  respiratory variability, suggesting right atrial pressure of 3 mmHg.  Anesthesia Physical Anesthesia Plan  ASA: 3  Anesthesia Plan: General   Post-op Pain Management: Minimal or no pain anticipated   Induction: Intravenous  PONV Risk Score and Plan: 3 and Dexamethasone , Ondansetron  and Treatment may vary due to age or medical condition  Airway Management Planned: Oral ETT  Additional Equipment:   Intra-op Plan:   Post-operative Plan: Extubation in OR  Informed Consent: I have reviewed the patients History and Physical, chart, labs and discussed the procedure including the risks, benefits and alternatives for the proposed anesthesia with the patient or authorized representative who has indicated his/her understanding and acceptance.     Dental advisory given  Plan Discussed with: CRNA  Anesthesia Plan Comments:          Anesthesia Quick Evaluation

## 2023-12-17 NOTE — Anesthesia Procedure Notes (Signed)
 Procedure Name: Intubation Date/Time: 12/17/2023 10:03 AM  Performed by: Cindie Donald CROME, CRNAPre-anesthesia Checklist: Patient identified, Emergency Drugs available, Suction available and Patient being monitored Patient Re-evaluated:Patient Re-evaluated prior to induction Oxygen Delivery Method: Circle System Utilized Preoxygenation: Pre-oxygenation with 100% oxygen Induction Type: IV induction Ventilation: Mask ventilation without difficulty Laryngoscope Size: Mac and 3 Grade View: Grade I Tube type: Oral Tube size: 7.0 mm Number of attempts: 1 Airway Equipment and Method: Stylet Placement Confirmation: ETT inserted through vocal cords under direct vision, positive ETCO2 and breath sounds checked- equal and bilateral Secured at: 22 cm Tube secured with: Tape Dental Injury: Teeth and Oropharynx as per pre-operative assessment

## 2023-12-17 NOTE — H&P (Signed)
  Electrophysiology Office Note:   Date:  12/17/2023  ID:  Marissa Powers, DOB 06/11/1940, MRN [id number]  Primary Cardiologist: Marissa Bruckner, MD Primary Heart Failure: None Electrophysiologist: Marissa Glantz Gladis Norton, MD      History of Present Illness:   Marissa Powers is a 83 y.o. female with h/o hypertension, hypothyroidism, coronary artery disease, atrial fibrillation seen today for  for Electrophysiology evaluation of atrial fibrillation at the request of Marissa Powers.    She saw her primary physician for annual physical and noted a rapid heartbeat in the middle of the night.  Her heart rate was in the 100-120 range.  The episode lasted for around 4 hours.  4 to 6 weeks prior to this, she had similar symptoms.  She presented to emergency room 07/28/2023 for atrial fibrillation.  She had a cardioversion at that time.  Today, denies symptoms of palpitations, chest pain, dyspnea, orthopnea, PND, lower extremity edema, claudication, dizziness, presyncope, syncope, bleeding, or neurologic sequela. The patient is tolerating medications without difficulties. Plan ablation today.   EP Information / Studies Reviewed:    EKG is not ordered today. EKG from 08/13/23 reviewed which showed sinus rhythm        Risk Assessment/Calculations:    CHA2DS2-VASc Score = 6   This indicates a 9.7% annual risk of stroke. The patient's score is based upon: CHF History: 1 HTN History: 1 Diabetes History: 0 Stroke History: 0 Vascular Disease History: 1 Age Score: 2 Gender Score: 1            Physical Exam:   VS:  BP (!) 163/63   Pulse (!) 49   Temp 98 F (36.7 C) (Oral)   Resp 17   Ht 5' 5 (1.651 m)   Wt 66.7 kg   SpO2 98%   BMI 24.46 kg/m    Wt Readings from Last 3 Encounters:  12/17/23 66.7 kg  11/19/23 68.4 kg  11/19/23 67.6 kg    GEN: Well nourished, well developed in no acute distress NECK: No JVD; No carotid bruits CARDIAC: Regular rate and rhythm, no murmurs, rubs,  gallops RESPIRATORY:  Clear to auscultation without rales, wheezing or rhonchi  ABDOMEN: Soft, non-tender, non-distended EXTREMITIES:  No edema; No deformity    ASSESSMENT AND PLAN:    1.  Paroxysmal atrial fibrillation: Marissa Powers has presented today for surgery, with the diagnosis of AF.  The various methods of treatment have been discussed with the patient and family. After consideration of risks, benefits and other options for treatment, the patient has consented to  Procedure(s): Catheter ablation as a surgical intervention .  Risks include but not limited to complete heart block, stroke, esophageal damage, nerve damage, bleeding, vascular damage, tamponade, perforation, MI, and death. The patient's history has been reviewed, patient examined, no change in status, stable for surgery.  I have reviewed the patient's chart and labs.  Questions were answered to the patient's satisfaction.    Marissa Lantigua Norton, MD 12/17/2023 9:15 AM

## 2023-12-17 NOTE — Transfer of Care (Signed)
 Immediate Anesthesia Transfer of Care Note  Patient: Marissa Powers  Procedure(s) Performed: ATRIAL FIBRILLATION ABLATION  Patient Location: Cath Lab  Anesthesia Type:General  Level of Consciousness: awake, alert , oriented, and patient cooperative  Airway & Oxygen Therapy: Patient Spontanous Breathing  Post-op Assessment: Report given to RN and Post -op Vital signs reviewed and stable  Post vital signs: Reviewed and stable  Last Vitals:  Vitals Value Taken Time  BP 124/66 1112  Temp    Pulse 67 12/17/23 11:14  Resp 22 12/17/23 11:14  SpO2 93 % 12/17/23 11:14  Vitals shown include unfiled device data.  Last Pain:  Vitals:   12/17/23 0847  TempSrc: Oral  PainSc:          Complications: There were no known notable events for this encounter.

## 2023-12-19 ENCOUNTER — Encounter (HOSPITAL_COMMUNITY): Payer: Self-pay | Admitting: Cardiology

## 2023-12-20 ENCOUNTER — Ambulatory Visit: Admitting: Surgery

## 2023-12-20 ENCOUNTER — Telehealth (HOSPITAL_COMMUNITY): Payer: Self-pay

## 2023-12-20 NOTE — Telephone Encounter (Signed)
 Spoke with patient to complete post procedure follow up call.  Patient reports no complications with groin sites.   Instructions reviewed with patient:  It is normal to have bruising, tenderness, mild swelling, and a pea or marble sized lump/knot at the groin site which can take up to three months to resolve.  Get help right away if you notice sudden swelling at the puncture site.  Check your puncture site every day for signs of infection: fever, redness, swelling, pus drainage, warmth, foul odor or excessive pain. If this occurs, please call [phone], to speak with the RN Navigator. Get help right away if your puncture site is bleeding and the bleeding does not stop after applying firm pressure to the area.  You may continue to have skipped beats/ atrial fibrillation during the first several months after your procedure.  It is very important not to miss any doses of your blood thinner Eliquis .    You will follow up with the Afib clinic on 01/17/24 and follow up with the Afib clinic on 03/20/24.    Patient verbalized understanding to all instructions provided.

## 2023-12-20 NOTE — Telephone Encounter (Signed)
 Attempted to reach patient to follow up with procedure completed on 12/17/23, no answer. Left VM for patient to return call.

## 2023-12-24 ENCOUNTER — Encounter (HOSPITAL_BASED_OUTPATIENT_CLINIC_OR_DEPARTMENT_OTHER): Payer: Self-pay

## 2023-12-24 ENCOUNTER — Emergency Department (HOSPITAL_BASED_OUTPATIENT_CLINIC_OR_DEPARTMENT_OTHER): Admitting: Radiology

## 2023-12-24 ENCOUNTER — Emergency Department (HOSPITAL_BASED_OUTPATIENT_CLINIC_OR_DEPARTMENT_OTHER)

## 2023-12-24 ENCOUNTER — Other Ambulatory Visit: Payer: Self-pay

## 2023-12-24 ENCOUNTER — Emergency Department (HOSPITAL_BASED_OUTPATIENT_CLINIC_OR_DEPARTMENT_OTHER)
Admission: EM | Admit: 2023-12-24 | Discharge: 2023-12-24 | Disposition: A | Discharge status: Home / Self Care | Attending: Emergency Medicine | Admitting: Emergency Medicine

## 2023-12-24 DIAGNOSIS — K5790 Diverticulosis of intestine, part unspecified, without perforation or abscess without bleeding: Secondary | ICD-10-CM | POA: Insufficient documentation

## 2023-12-24 DIAGNOSIS — I2510 Atherosclerotic heart disease of native coronary artery without angina pectoris: Secondary | ICD-10-CM | POA: Diagnosis not present

## 2023-12-24 DIAGNOSIS — I4891 Unspecified atrial fibrillation: Secondary | ICD-10-CM | POA: Diagnosis not present

## 2023-12-24 DIAGNOSIS — I509 Heart failure, unspecified: Secondary | ICD-10-CM | POA: Insufficient documentation

## 2023-12-24 DIAGNOSIS — Z7901 Long term (current) use of anticoagulants: Secondary | ICD-10-CM | POA: Insufficient documentation

## 2023-12-24 DIAGNOSIS — R079 Chest pain, unspecified: Secondary | ICD-10-CM | POA: Diagnosis not present

## 2023-12-24 DIAGNOSIS — D259 Leiomyoma of uterus, unspecified: Secondary | ICD-10-CM | POA: Diagnosis not present

## 2023-12-24 DIAGNOSIS — R0789 Other chest pain: Secondary | ICD-10-CM | POA: Diagnosis not present

## 2023-12-24 DIAGNOSIS — R0602 Shortness of breath: Secondary | ICD-10-CM | POA: Insufficient documentation

## 2023-12-24 DIAGNOSIS — I700 Atherosclerosis of aorta: Secondary | ICD-10-CM | POA: Diagnosis not present

## 2023-12-24 LAB — TROPONIN T, HIGH SENSITIVITY
Troponin T High Sensitivity: 23 ng/L — ABNORMAL HIGH (ref 0–19)
Troponin T High Sensitivity: 24 ng/L — ABNORMAL HIGH (ref 0–19)

## 2023-12-24 LAB — BASIC METABOLIC PANEL WITH GFR
Anion gap: 15 (ref 5–15)
BUN: 21 mg/dL (ref 8–23)
CO2: 23 mmol/L (ref 22–32)
Calcium: 10.9 mg/dL — ABNORMAL HIGH (ref 8.9–10.3)
Chloride: 100 mmol/L (ref 98–111)
Creatinine, Ser: 1.18 mg/dL — ABNORMAL HIGH (ref 0.44–1.00)
GFR, Estimated: 46 mL/min — ABNORMAL LOW (ref >60)
Glucose, Bld: 102 mg/dL — ABNORMAL HIGH (ref 70–99)
Potassium: 4 mmol/L (ref 3.5–5.1)
Sodium: 139 mmol/L (ref 135–145)

## 2023-12-24 LAB — CBC
HCT: 34 % — ABNORMAL LOW (ref 36.0–46.0)
Hemoglobin: 11.4 g/dL — ABNORMAL LOW (ref 12.0–15.0)
MCH: 37 pg — ABNORMAL HIGH (ref 26.0–34.0)
MCHC: 33.5 g/dL (ref 30.0–36.0)
MCV: 110.4 fL — ABNORMAL HIGH (ref 80.0–100.0)
Platelets: 150 K/uL (ref 150–400)
RBC: 3.08 MIL/uL — ABNORMAL LOW (ref 3.87–5.11)
RDW: 14.7 % (ref 11.5–15.5)
WBC: 9.3 K/uL (ref 4.0–10.5)
nRBC: 0 % (ref 0.0–0.2)

## 2023-12-24 LAB — D-DIMER, QUANTITATIVE: D-Dimer, Quant: 0.5 ug{FEU}/mL (ref 0.00–0.50)

## 2023-12-24 MED ORDER — LIDOCAINE VISCOUS HCL 2 % MT SOLN
15.0000 mL | Freq: Once | OROMUCOSAL | Status: AC
  Administered 2023-12-24: 15 mL via ORAL
  Filled 2023-12-24: qty 15

## 2023-12-24 MED ORDER — ALUM & MAG HYDROXIDE-SIMETH 200-200-20 MG/5ML PO SUSP
30.0000 mL | Freq: Once | ORAL | Status: AC
  Administered 2023-12-24: 30 mL via ORAL
  Filled 2023-12-24: qty 30

## 2023-12-24 MED ORDER — IOHEXOL 350 MG/ML SOLN
100.0000 mL | Freq: Once | INTRAVENOUS | Status: AC | PRN
  Administered 2023-12-24: 80 mL via INTRAVENOUS

## 2023-12-24 NOTE — Discharge Instructions (Signed)
 You were seen for your chest pain in the emergency department.   At home, please take Tylenol  and antacids.    Follow-up with your primary doctor in 2-3 days regarding your visit.  Cardiology will be calling you regarding an appointment within the next 72 hours.  You may contact them if you do not hear from them in that time using the information in this packet.  Return immediately to the emergency department if you experience any of the following: Worsening pain, difficulty breathing, unexplained vomiting or sweating, or any other concerning symptoms.    Thank you for visiting our Emergency Department. It was a pleasure taking care of you today.

## 2023-12-24 NOTE — ED Provider Notes (Signed)
 [place] EMERGENCY DEPARTMENT AT [facility] Provider Note   CSN: [id number] Arrival date & time: 12/24/23  2035     Patient presents with: Chest Pain   Marissa Powers is a 83 y.o. female.  {Add pertinent medical, surgical, social history, OB history to HPI:51105} 83 year old female with a history of CHF, aortic stenosis, chronic aortic dissection, atrial fibrillation on Eliquis , nonobstructive CAD who presents to the emergency department with chest pain and shortness of breath.  Patient reports that she was out walking and started feeling reflux sensation.  Also had some shortness of breath.  Felt more weak than usual.  No diaphoresis or vomiting.  Did have a recent heart ablation for A-fib and had a pause and her Eliquis        Prior to Admission medications   Medication Sig Start Date End Date Taking? Authorizing Provider  acetaminophen  (TYLENOL ) 500 MG tablet Take 1,000 mg by mouth every 6 (six) hours as needed for moderate pain (pain score 4-6).    [provider]  amiodarone  (PACERONE ) 200 MG tablet Take 200 mg by mouth daily.    [provider]  amoxicillin  (AMOXIL ) 500 MG tablet Take 2,000 mg by mouth See admin instructions. One hour prior to dental procedures 08/30/23   [provider]  b complex vitamins capsule Take 1 capsule by mouth daily.    [provider]  Calcium  Carb-Cholecalciferol  (CALCIUM  + D3 PO) Take 1 tablet by mouth in the morning.    [provider]  cholecalciferol  (VITAMIN D3) 25 MCG (1000 UNIT) tablet Take 1,000 Units by mouth in the morning.    [provider]  dapagliflozin  propanediol (FARXIGA ) 10 MG TABS tablet TAKE 1 TABLET(10 MG) BY MOUTH DAILY BEFORE BREAKFAST 10/18/23   Lonni Slain, MD  denosumab  (PROLIA ) 60 MG/ML SOSY injection Inject 60 mg into the skin every 6 (six) months.    [provider]  ELIQUIS  5 MG TABS tablet TAKE 1 TABLET(5 MG) BY MOUTH TWICE DAILY 08/20/23    Bensimhon, Toribio SAUNDERS, MD  folic acid  (FOLVITE ) 1 MG tablet Take 2 mg by mouth in the morning.    [provider]  hydrochlorothiazide  (MICROZIDE ) 12.5 MG capsule Take 12.5 mg by mouth daily. 08/09/23   [provider]  hydrocortisone  (ANUSOL -HC) 2.5 % rectal cream Place 1 Application rectally 2 (two) times daily. Patient taking differently: Place 1 Application rectally 2 (two) times daily as needed for hemorrhoids. 11/19/23   Federico Rosario BROCKS, MD  letrozole  (FEMARA ) 2.5 MG tablet TAKE 1 TABLET(2.5 MG) BY MOUTH DAILY 05/11/23   Gudena, Vinay, MD  levothyroxine  (SYNTHROID ) 75 MCG tablet Take 75 mcg by mouth daily before breakfast.    [provider]  loperamide (IMODIUM) 2 MG capsule Take 2-4 mg by mouth 4 (four) times daily as needed (upset stomach/diarrhea/loose stools).    [provider]  lubiprostone  (AMITIZA ) 8 MCG capsule Take 1 capsule (8 mcg total) by mouth 2 (two) times daily with a meal. Patient taking differently: Take 8 mcg by mouth daily. 11/19/23   Federico Rosario BROCKS, MD  metoprolol  succinate (TOPROL -XL) 50 MG 24 hr tablet Take 50 mg by mouth daily. 09/15/23   [provider]  nitroGLYCERIN  (NITROSTAT ) 0.4 MG SL tablet Place 1 tablet (0.4 mg total) under the tongue every 5 (five) minutes as needed for chest pain. 07/05/20   Claudene Victory ORN, MD  ondansetron  (ZOFRAN -ODT) 4 MG disintegrating tablet Take 4 mg by mouth every 8 (eight) hours as needed  for nausea or vomiting. 06/03/23   [provider]  predniSONE  (DELTASONE ) 1 MG tablet Take 1 mg by mouth daily with breakfast.    [provider]  REPATHA SURECLICK 140 MG/ML SOAJ Inject 140 mg into the skin every 14 (fourteen) days. 08/06/21   [provider]  sacubitril-valsartan (ENTRESTO ) 49-51 MG Take 1 tablet by mouth 2 (two) times daily. 09/07/23   Bensimhon, Toribio SAUNDERS, MD  zolpidem  (AMBIEN ) 5 MG tablet Take 5 mg by mouth at bedtime.    [provider]    Allergies: Cashew nut  oil, Cashew nut (anacardium occidentale) skin test, and Bee venom    Review of Systems  Updated Vital Signs BP (!) 203/97 (BP Location: Right Arm)   Pulse 70   Temp 97.9 F (36.6 C)   Resp 20   Ht 5' 5 (1.651 m)   Wt 67.1 kg   SpO2 100%   BMI 24.63 kg/m   Physical Exam  (all labs ordered are listed, but only abnormal results are displayed) Labs Reviewed  CBC - Abnormal; Notable for the following components:      Result Value   RBC 3.08 (*)    Hemoglobin 11.4 (*)    HCT 34.0 (*)    MCV 110.4 (*)    MCH 37.0 (*)    All other components within normal limits  BASIC METABOLIC PANEL WITH GFR  TROPONIN T, HIGH SENSITIVITY    EKG: EKG Interpretation Date/Time:  Friday December 24 2023 20:46:14 EDT Ventricular Rate:  65 PR Interval:    QRS Duration:  102 QT Interval:  430 QTC Calculation: 448 R Axis:   -14  Text Interpretation: Atrial fibrillation Anterior infarct, old Borderline ST depression, lateral leads Confirmed by Yolande Charleston [phone]) on 12/24/2023 9:15:52 PM  Radiology: No results found.  {Document cardiac monitor, telemetry assessment procedure when appropriate:32947} Procedures   Medications Ordered in the ED - No data to display    {Click here for ABCD2, HEART and other calculators REFRESH Note before signing:1}                              Medical Decision Making Amount and/or Complexity of Data Reviewed Labs: ordered. Radiology: ordered.   ***  {Document critical care time when appropriate  Document review of labs and clinical decision tools ie CHADS2VASC2, etc  Document your independent review of radiology images and any outside records  Document your discussion with family members, caretakers and with consultants  Document social determinants of health affecting pt's care  Document your decision making why or why not admission, treatments were needed:32947:::1}   Final diagnoses:  None    ED Discharge Orders     None

## 2023-12-24 NOTE — ED Triage Notes (Signed)
 Pt reports chest pain x5 hours with SOB. Pt reports ablation x1 week ago for afib. Pt denies any N/V.

## 2023-12-27 ENCOUNTER — Inpatient Hospital Stay: Payer: Medicare Other | Attending: Hematology and Oncology | Admitting: Hematology and Oncology

## 2023-12-27 VITALS — BP 150/90 | HR 58 | Temp 97.7°F | Resp 18 | Ht 65.0 in | Wt 147.9 lb

## 2023-12-27 DIAGNOSIS — Z7984 Long term (current) use of oral hypoglycemic drugs: Secondary | ICD-10-CM | POA: Insufficient documentation

## 2023-12-27 DIAGNOSIS — Z79811 Long term (current) use of aromatase inhibitors: Secondary | ICD-10-CM | POA: Diagnosis not present

## 2023-12-27 DIAGNOSIS — Z7901 Long term (current) use of anticoagulants: Secondary | ICD-10-CM | POA: Diagnosis not present

## 2023-12-27 DIAGNOSIS — Z923 Personal history of irradiation: Secondary | ICD-10-CM | POA: Diagnosis not present

## 2023-12-27 DIAGNOSIS — Z79899 Other long term (current) drug therapy: Secondary | ICD-10-CM | POA: Insufficient documentation

## 2023-12-27 DIAGNOSIS — Z1721 Progesterone receptor positive status: Secondary | ICD-10-CM | POA: Diagnosis not present

## 2023-12-27 DIAGNOSIS — C50512 Malignant neoplasm of lower-outer quadrant of left female breast: Secondary | ICD-10-CM | POA: Insufficient documentation

## 2023-12-27 DIAGNOSIS — I4891 Unspecified atrial fibrillation: Secondary | ICD-10-CM | POA: Insufficient documentation

## 2023-12-27 DIAGNOSIS — Z170 Estrogen receptor positive status [ER+]: Secondary | ICD-10-CM | POA: Diagnosis not present

## 2023-12-27 DIAGNOSIS — Z7952 Long term (current) use of systemic steroids: Secondary | ICD-10-CM | POA: Diagnosis not present

## 2023-12-27 DIAGNOSIS — Z1732 Human epidermal growth factor receptor 2 negative status: Secondary | ICD-10-CM | POA: Diagnosis not present

## 2023-12-27 NOTE — Assessment & Plan Note (Signed)
 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  Adjuvant radiation therapy: 02/06/2022-03/26/2022 2. antiestrogen therapy: Started as neoadjuvant therapy with letrozole .  She tolerated it very well.  She will continue on with the same.  Started 07/30/2021 -------------------------------------------------------------------------------------------------------------------------- Letrozole  toxicities: Tolerating it extremely well without any side effects.  Denies any hot flashes. Breast cancer surveillance: Breast exam 12/27/2023: Benign Mammogram 06/18/2023 at [facility]: Benign breast density category C CT angio chest 12/24/2023: Stable chronic type B dissection of descending thoracic aorta   Return to clinic in 1 year for follow-up

## 2023-12-27 NOTE — Progress Notes (Signed)
 Patient Care Team: Shayne Anes, MD as PCP - General (Internal Medicine) Lonni Slain, MD as PCP - Cardiology (Cardiology) Inocencio Soyla Lunger, MD as PCP - Electrophysiology (Cardiology) Odean Potts, MD as Consulting Physician (Hematology and Oncology) Ebbie Cough, MD as Consulting Physician (General Surgery) Dewey Rush, MD as Consulting Physician (Radiation Oncology) Bensimhon, Toribio SAUNDERS, MD as Consulting Physician (Cardiology) Cleotilde Ronal RAMAN, MD as Consulting Physician (Obstetrics and Gynecology)  DIAGNOSIS:  Encounter Diagnosis  Name Primary?   Malignant neoplasm of lower-outer quadrant of left breast of female, estrogen receptor positive (HCC) Yes    SUMMARY OF ONCOLOGIC HISTORY: Oncology History  Malignant neoplasm of lower-outer quadrant of left breast of female, estrogen receptor positive (HCC)  07/18/2021 Initial Diagnosis   Left breast global asymmetry/density measuring 8 x 4.4 x 4.7 cm.  Ultrasound showed 2 focal masses.  3.1 cm: Biopsy grade 2 ILC ER 90%, PR 10%, HER2 equivocal, FISH negative, Ki-67 10%; lymph node biopsy: Positive Breast MRI abnormality left breast measured 8 x 4.2 x 4.1 cm    Genetic Testing   [facility] CancerNext Panel was Negative. 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 Report date is 09/11/2021.  The CancerNext gene panel offered by [facility] [employer] includes sequencing, rearrangement analysis, and RNA analysis for the following 36 genes:   APC, ATM, AXIN2, BARD1, BMPR1A, BRCA1, BRCA2, BRIP1, CDH1, CDK4, CDKN2A, CHEK2, DICER1, HOXB13, EPCAM, GREM1, MLH1, MSH2, MSH3, MSH6, MUTYH, NBN, NF1, NTHL1, PALB2, PMS2, POLD1, POLE, PTEN, RAD51C, RAD51D, RECQL, SMAD4, SMARCA4, STK11, and TP53.    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, focal pleomorphic LCIS, angiolymphatic invasion present,  1/4 lymph nodes positive Additional inferior margin: Grade 2 invasive pleomorphic lobular carcinoma with pleomorphic LCIS, look inflamed for vascular invasion present, ER 90%, PR 10%, HER2 negative, Ki-67 10%   01/07/2022 Cancer Staging   Staging form: Breast, AJCC 8th Edition - Pathologic: Stage IB (pT3, pN1, cM0, G2, ER+, PR+, HER2-) - Signed by Odean Potts, MD on 01/07/2022 Stage prefix: Initial diagnosis Histologic grading system: 3 grade system   02/05/2022 - 03/25/2022 Radiation Therapy   Site Technique Total Dose (Gy) Dose per Fx (Gy) Completed Fx Beam Energies  Breast, Left: Breast_L 3D 50.4/50.4 1.8 28/28 10XFFF  Breast, Left: Breast_L_SCV_PAB 3D 50.4/50.4 1.8 28/28 6X, 10X  Breast, Left: Breast_L_Bst 3D 10/10 2 5/5 6X, 10X       CHIEF COMPLIANT:   HISTORY OF PRESENT ILLNESS:   History of Present Illness Marissa Powers is an 83 year old female with atrial fibrillation who presents for follow-up after ablation and ongoing letrozole  therapy.  She has been on letrozole  for two and a half years for estrogen receptor-positive breast cancer. She reports decreased energy levels, which she attributes to the medication. Recent imaging, including a CT angiogram, noted scar tissue from previous radiation treatments, but she has no current concerns about this finding.     ALLERGIES:  is allergic to cashew nut oil, cashew nut (anacardium occidentale) skin test, and bee venom.  MEDICATIONS:  Current Outpatient Medications  Medication Sig Dispense Refill   acetaminophen  (TYLENOL ) 500 MG tablet Take 1,000 mg by mouth every 6 (six) hours as needed for moderate pain (pain score 4-6).     amiodarone  (PACERONE ) 200 MG tablet Take 200 mg by mouth daily.     b complex vitamins capsule Take  1 capsule by mouth daily.     Calcium  Carb-Cholecalciferol  (CALCIUM  + D3 PO) Take 1 tablet by mouth in the morning.     cholecalciferol  (VITAMIN D3) 25 MCG (1000 UNIT) tablet Take 1,000 Units by  mouth in the morning.     dapagliflozin  propanediol (FARXIGA ) 10 MG TABS tablet TAKE 1 TABLET(10 MG) BY MOUTH DAILY BEFORE BREAKFAST 30 tablet 11   denosumab  (PROLIA ) 60 MG/ML SOSY injection Inject 60 mg into the skin every 6 (six) months.     ELIQUIS  5 MG TABS tablet TAKE 1 TABLET(5 MG) BY MOUTH TWICE DAILY 60 tablet 2   folic acid  (FOLVITE ) 1 MG tablet Take 2 mg by mouth in the morning.     hydrochlorothiazide  (MICROZIDE ) 12.5 MG capsule Take 12.5 mg by mouth daily.     hydrocortisone  (ANUSOL -HC) 2.5 % rectal cream Place 1 Application rectally 2 (two) times daily. 30 g 1   letrozole  (FEMARA ) 2.5 MG tablet TAKE 1 TABLET(2.5 MG) BY MOUTH DAILY 90 tablet 3   lubiprostone  (AMITIZA ) 8 MCG capsule Take 1 capsule (8 mcg total) by mouth 2 (two) times daily with a meal. 30 capsule 3   metoprolol  succinate (TOPROL -XL) 50 MG 24 hr tablet Take 50 mg by mouth daily.     nitroGLYCERIN  (NITROSTAT ) 0.4 MG SL tablet Place 1 tablet (0.4 mg total) under the tongue every 5 (five) minutes as needed for chest pain. 25 tablet 3   ondansetron  (ZOFRAN -ODT) 4 MG disintegrating tablet Take 4 mg by mouth every 8 (eight) hours as needed for nausea or vomiting.     predniSONE  (DELTASONE ) 1 MG tablet Take 1 mg by mouth daily with breakfast.     REPATHA SURECLICK 140 MG/ML SOAJ Inject 140 mg into the skin every 14 (fourteen) days.     sacubitril-valsartan (ENTRESTO ) 49-51 MG Take 1 tablet by mouth 2 (two) times daily. 60 tablet 11   zolpidem  (AMBIEN ) 5 MG tablet Take 5 mg by mouth at bedtime.     levothyroxine  (SYNTHROID ) 75 MCG tablet Take 75 mcg by mouth daily before breakfast.     No current facility-administered medications for this visit.    PHYSICAL EXAMINATION: ECOG PERFORMANCE STATUS: 1 - Symptomatic but completely ambulatory  Vitals:   12/27/23 0924  BP: (!) 150/90  Pulse: (!) 58  Resp: 18  Temp: 97.7 F (36.5 C)  SpO2: 99%   Filed Weights   12/27/23 0924  Weight: 147 lb 14.4 oz (67.1 kg)     Physical Exam No palpable lumps or nodules in the right breast.  In the left breast there is a palpable scar tissue nodule at the site of prior surgery.  (exam performed in the presence of a chaperone)  LABORATORY DATA:  I have reviewed the data as listed    Latest Ref Rng & Units 12/24/2023    8:50 PM 11/19/2023    8:30 AM 11/19/2023    8:25 AM  CMP  Glucose 70 - 99 mg/dL 897   84   BUN 8 - 23 mg/dL 21  20  19    Creatinine 0.44 - 1.00 mg/dL 8.81  8.85  8.88   Sodium 135 - 145 mmol/L 139   141   Potassium 3.5 - 5.1 mmol/L 4.0   4.1   Chloride 98 - 111 mmol/L 100   100   CO2 22 - 32 mmol/L 23   23   Calcium  8.9 - 10.3 mg/dL 89.0   9.5     Lab Results  Component Value Date   WBC 9.3 12/24/2023   HGB 11.4 (L) 12/24/2023   HCT 34.0 (L) 12/24/2023   MCV 110.4 (H) 12/24/2023   PLT 150 12/24/2023   NEUTROABS 5.2 07/13/2021    ASSESSMENT & PLAN:  Malignant neoplasm of lower-outer quadrant of left breast of female, estrogen receptor positive (HCC) 12/29/2021: Left lumpectomy: 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  Adjuvant radiation therapy: 02/06/2022-03/26/2022 2. antiestrogen therapy: Started as neoadjuvant therapy with letrozole .  She tolerated it very well.  She will continue on with the same.  Started 07/30/2021 -------------------------------------------------------------------------------------------------------------------------- Letrozole  toxicities: Tolerating it extremely well without any side effects.  Denies any hot  flashes. Breast cancer surveillance: Breast exam 12/27/2023: Benign, nodular scar tissue was felt at the surgical site as an effect of radiation. 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 Sheppard office soon. Return to clinic in 1 year for follow-up ------------------------------------- Assessment and Plan Assessment & Plan Estrogen receptor positive malignant neoplasm of lower-outer quadrant of left breast, status post radiation and on adjuvant letrozole  Halfway through five-year letrozole  therapy. No reported significant side effects. Imaging showed expected post-radiation scar tissue. - Continue letrozole  for 2.5 more years. - Monitor for side effects: heart fractures, joint stiffness, bone density loss. - Perform bone density test soon. - Continue annual mammograms.      No orders of the defined types were placed in this encounter.  The patient has a good understanding of the overall plan. she agrees with it. she will call with any problems that may develop before the next visit here. Total time spent: 30 mins including face to face time and time spent for planning, charting and co-ordination of care   Naomi MARLA Chad, MD 12/27/23

## 2024-01-10 DIAGNOSIS — F1920 Other psychoactive substance dependence, uncomplicated: Secondary | ICD-10-CM | POA: Diagnosis not present

## 2024-01-10 DIAGNOSIS — K219 Gastro-esophageal reflux disease without esophagitis: Secondary | ICD-10-CM | POA: Diagnosis not present

## 2024-01-10 DIAGNOSIS — R7301 Impaired fasting glucose: Secondary | ICD-10-CM | POA: Diagnosis not present

## 2024-01-10 DIAGNOSIS — R42 Dizziness and giddiness: Secondary | ICD-10-CM | POA: Diagnosis not present

## 2024-01-10 DIAGNOSIS — M8589 Other specified disorders of bone density and structure, multiple sites: Secondary | ICD-10-CM | POA: Diagnosis not present

## 2024-01-10 DIAGNOSIS — I480 Paroxysmal atrial fibrillation: Secondary | ICD-10-CM | POA: Diagnosis not present

## 2024-01-10 DIAGNOSIS — I350 Nonrheumatic aortic (valve) stenosis: Secondary | ICD-10-CM | POA: Diagnosis not present

## 2024-01-10 DIAGNOSIS — M353 Polymyalgia rheumatica: Secondary | ICD-10-CM | POA: Diagnosis not present

## 2024-01-10 DIAGNOSIS — E039 Hypothyroidism, unspecified: Secondary | ICD-10-CM | POA: Diagnosis not present

## 2024-01-10 DIAGNOSIS — K589 Irritable bowel syndrome without diarrhea: Secondary | ICD-10-CM | POA: Diagnosis not present

## 2024-01-10 DIAGNOSIS — G252 Other specified forms of tremor: Secondary | ICD-10-CM | POA: Diagnosis not present

## 2024-01-10 DIAGNOSIS — Z23 Encounter for immunization: Secondary | ICD-10-CM | POA: Diagnosis not present

## 2024-01-10 DIAGNOSIS — I5032 Chronic diastolic (congestive) heart failure: Secondary | ICD-10-CM | POA: Diagnosis not present

## 2024-01-10 DIAGNOSIS — I110 Hypertensive heart disease with heart failure: Secondary | ICD-10-CM | POA: Diagnosis not present

## 2024-01-10 DIAGNOSIS — E785 Hyperlipidemia, unspecified: Secondary | ICD-10-CM | POA: Diagnosis not present

## 2024-01-11 ENCOUNTER — Other Ambulatory Visit: Payer: Self-pay | Admitting: Internal Medicine

## 2024-01-11 DIAGNOSIS — R519 Headache, unspecified: Secondary | ICD-10-CM

## 2024-01-17 ENCOUNTER — Encounter (HOSPITAL_COMMUNITY): Payer: Self-pay | Admitting: Internal Medicine

## 2024-01-17 ENCOUNTER — Ambulatory Visit (HOSPITAL_COMMUNITY)
Admission: RE | Admit: 2024-01-17 | Discharge: 2024-01-17 | Disposition: A | Discharge status: Home / Self Care | Source: Ambulatory Visit | Attending: Internal Medicine | Admitting: Internal Medicine

## 2024-01-17 VITALS — BP 116/60 | HR 58 | Ht 65.0 in | Wt 149.0 lb

## 2024-01-17 DIAGNOSIS — D6869 Other thrombophilia: Secondary | ICD-10-CM

## 2024-01-17 DIAGNOSIS — Z79899 Other long term (current) drug therapy: Secondary | ICD-10-CM

## 2024-01-17 DIAGNOSIS — Z5181 Encounter for therapeutic drug level monitoring: Secondary | ICD-10-CM

## 2024-01-17 DIAGNOSIS — I480 Paroxysmal atrial fibrillation: Secondary | ICD-10-CM | POA: Diagnosis not present

## 2024-01-17 MED ORDER — APIXABAN 5 MG PO TABS: 5.0000 mg | ORAL_TABLET | Freq: Two times a day (BID) | ORAL | 6 refills | Status: AC

## 2024-01-17 NOTE — Progress Notes (Signed)
 Primary Care Physician: Shayne Anes, MD Referring Physician: Dr. Shayne Primary Cardiologist: Dr Claudene Primary EP: Dr Kelsie   Marissa Powers is a 83 y.o. female with a h/o palpitations, htn, hypothyroidism, CAD.She saw Dr. Shayne, 4/29, for her annual physical and mentioned rapid heart beat in the middle of the night, which sounded suspicious  for afib and referred here for further evaluation. The pt brings in recorded  V/S, where she had an episode for around 4 hours with HR's in the 100-120 bpm range in March. She went around 4-6  weeks before she had similar symptoms. She states that she may snore, but she is a widow so no one in the house to report apnea. She reports that she sleeps well and does not have daytime somnolence. She does drink alcohol  several times a week, 1-2 drinks at a time. She has been on metoprolol  for years for palpitations and will usually take an extra 1/2 tab of metoprolol  when she has an episode. She takes a baby ASA a day. Chadsvasc score is at least 4. No tobacco use, minimal caffeine use. Her son is a Scientist, research (life sciences) in [place].  On follow up 08/13/23, she is currently in NSR. Recently in ED on 07/28/23 for Afib with RVR s/p successful DCCV. Metoprolol  increased to 50 mg daily. History of being on amiodarone  which was discontinued April 2024 (she was taking it 3 days a week). She has not had recurrence of Afib since cardioversion. She does not feel at night intermittent sensations of what she believes to be PVCs or a clunking feeling. No missed doses of Eliquis  5 mg BID.   On follow up 01/17/24, patient is currently in NSR. S/p Afib ablation on 12/17/23 by Dr. Inocencio. No episodes of Afib since ablation. She is taking amiodarone  200 mg daily. She was seen in ED on 9/12 for chest pain and SOB. No further episodes of chest pain or SOB. Leg sites healed without issue. No missed doses of anticoagulant.  Today, she denies symptoms of orthopnea, PND, lower extremity edema,  dizziness, presyncope, syncope, snoring, daytime somnolence, bleeding, or neurologic sequela. The patient is tolerating medications without difficulties and is otherwise without complaint today.    Past Medical History:  Diagnosis Date   Arthritis    osteoarthritis   Breast cancer (HCC) 11/2021   left   CAD (coronary artery disease)    Descending thoracic aortic dissection (HCC)    followed by Dr. 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 Verlin & Dr. Lucas .   Severe aortic stenosis    Past Surgical History:  Procedure Laterality Date   ATRIAL FIBRILLATION ABLATION N/A 12/17/2023   Procedure: ATRIAL FIBRILLATION ABLATION;  Surgeon: Inocencio Soyla Lunger, MD;  Location: [facility];  Service: Cardiovascular;  Laterality: N/A;   AXILLARY SENTINEL NODE BIOPSY Left 12/29/2021   Procedure: LEFT AXILLARY SENTINEL NODE BIOPSY;  Surgeon: Ebbie Cough, MD;  Location: [facility] OR;  Service: General;  Laterality: Left;   BREAST BIOPSY Bilateral 1970's   x2 one a side   BREAST LUMPECTOMY WITH RADIOACTIVE SEED LOCALIZATION Left 12/29/2021   Procedure: BREAST LUMPECTOMY WITH RADIOACTIVE SEED LOCALIZATION X2;  Surgeon: Ebbie Cough, MD;  Location: [facility] OR;  Service: General;  Laterality: Left;   CATARACT EXTRACTION Bilateral    bilateral   COLONOSCOPY     implantable loop recorder removal  05/30/2021   MDT reveal LINQ removed by Dr Kelsie   INTRAOPERATIVE TRANSTHORACIC ECHOCARDIOGRAM N/A 08/19/2021    Procedure: INTRAOPERATIVE TRANSTHORACIC ECHOCARDIOGRAM;  Surgeon: Verlin Lonni BIRCH, MD;  Location: [facility] OR;  Service: Open Heart Surgery;  Laterality: N/A;   LOOP RECORDER INSERTION N/A 08/11/2017   Procedure: LOOP RECORDER INSERTION;  Surgeon: Kelsie Agent, MD;  Location: [facility];  Service: Cardiovascular;  Laterality: N/A;   RADIOACTIVE SEED GUIDED AXILLARY SENTINEL LYMPH NODE Left 12/29/2021   Procedure: LEFT AXILLARY NODE SEED GUIDED EXCISION;  Surgeon: Ebbie Cough, MD;  Location: [facility] OR;  Service: General;  Laterality: Left;   RETINAL DETACHMENT SURGERY  2000   RIGHT/LEFT HEART CATH AND CORONARY ANGIOGRAPHY N/A 06/19/2021   Procedure: RIGHT/LEFT HEART CATH AND CORONARY ANGIOGRAPHY;  Surgeon: Claudene Victory ORN, MD;  Location: [facility];  Service: Cardiovascular;  Laterality: N/A;   THYROIDECTOMY  1967   TONSILLECTOMY  1950   TOTAL KNEE ARTHROPLASTY  04/25/2012   Procedure: TOTAL KNEE BILATERAL;  Surgeon: Dempsey LULLA Moan, MD;  Location: [facility] ORS;  Service: Orthopedics;  Laterality: Bilateral;   TRANSCATHETER AORTIC VALVE REPLACEMENT, TRANSFEMORAL N/A 08/19/2021   Procedure: Transcatheter Aortic Valve Replacement, Transfemoral Using 23mm Sapien 3 Ultra Edwards Valve;  Surgeon: Verlin Lonni BIRCH, MD;  Location: [facility] OR;  Service: Open Heart Surgery;  Laterality: N/A;    Current Outpatient Medications  Medication Sig Dispense Refill   acetaminophen  (TYLENOL ) 500 MG tablet Take 1,000 mg by mouth every 6 (six) hours as needed for moderate pain (pain score 4-6).     amiodarone  (PACERONE ) 200 MG tablet Take 200 mg by mouth daily.     b complex vitamins capsule Take 1 capsule by mouth daily.     Calcium  Carb-Cholecalciferol  (CALCIUM  + D3 PO) Take 1 tablet by mouth in the morning.     cholecalciferol  (VITAMIN D3) 25 MCG (1000 UNIT) tablet Take 1,000 Units by mouth in the morning.     dapagliflozin  propanediol (FARXIGA ) 10 MG TABS tablet TAKE 1 TABLET(10 MG) BY MOUTH  DAILY BEFORE BREAKFAST 30 tablet 11   denosumab  (PROLIA ) 60 MG/ML SOSY injection Inject 60 mg into the skin every 6 (six) months.     ELIQUIS  5 MG TABS tablet TAKE 1 TABLET(5 MG) BY MOUTH TWICE DAILY 60 tablet 2   folic acid  (FOLVITE ) 1 MG tablet Take 2 mg by mouth in the morning.     hydrochlorothiazide  (MICROZIDE ) 12.5 MG capsule Take 12.5 mg by mouth daily.     hydrocortisone  (ANUSOL -HC) 2.5 % rectal cream Place 1 Application rectally 2 (two) times daily. 30 g 1   letrozole  (FEMARA ) 2.5 MG tablet TAKE 1 TABLET(2.5 MG) BY MOUTH DAILY 90 tablet 3   levothyroxine  (SYNTHROID ) 75 MCG tablet Take 75 mcg by mouth daily before breakfast.     lubiprostone  (AMITIZA ) 8 MCG capsule Take 1 capsule (8 mcg total) by mouth 2 (two) times daily with a meal. 30 capsule 3   metoprolol  succinate (TOPROL -XL) 50 MG 24 hr tablet Take  50 mg by mouth daily.     nitroGLYCERIN  (NITROSTAT ) 0.4 MG SL tablet Place 1 tablet (0.4 mg total) under the tongue every 5 (five) minutes as needed for chest pain. 25 tablet 3   ondansetron  (ZOFRAN -ODT) 4 MG disintegrating tablet Take 4 mg by mouth every 8 (eight) hours as needed for nausea or vomiting.     predniSONE  (DELTASONE ) 1 MG tablet Take 1 mg by mouth daily with breakfast.     REPATHA SURECLICK 140 MG/ML SOAJ Inject 140 mg into the skin every 14 (fourteen) days.     sacubitril-valsartan (ENTRESTO ) 49-51 MG Take 1 tablet by mouth 2 (two) times daily. 60 tablet 11   zolpidem  (AMBIEN ) 5 MG tablet Take 5 mg by mouth at bedtime.     No current facility-administered medications for this visit.    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 Hives   Bee Venom Other (See Comments)    Other Reaction(s): red/swelling   ROS- All systems are reviewed and negative except as per the HPI above  Physical Exam: There were no vitals filed for this  visit.   Wt Readings from Last 3 Encounters:  12/27/23 67.1 kg  12/24/23 67.1 kg  12/17/23 66.7 kg    Labs: Lab Results  Component Value Date   NA 139 12/24/2023   K 4.0 12/24/2023   CL 100 12/24/2023   CO2 23 12/24/2023   GLUCOSE 102 (H) 12/24/2023   BUN 21 12/24/2023   CREATININE 1.18 (H) 12/24/2023   CALCIUM  10.9 (H) 12/24/2023   MG 2.1 07/28/2023   Lab Results  Component Value Date   INR 1.4 (H) 06/15/2023   Lab Results  Component Value Date   CHOL 140 02/16/2023   HDL 49 02/16/2023   LDLCALC 66 02/16/2023   TRIG 143 02/16/2023   GEN- The patient is well appearing, alert and oriented x 3 today.   Neck - no JVD or carotid bruit noted Lungs- Clear to ausculation bilaterally, normal work of breathing Heart- Regular rate and rhythm, no murmurs, rubs or gallops, PMI not laterally displaced Extremities- no clubbing, cyanosis, or edema Skin - no rash or ecchymosis noted   EKG-  Vent. rate 58 BPM PR interval 198 ms QRS duration 90 ms QT/QTcB 462/453 ms P-R-T axes 65 -25 34 Sinus bradycardia Minimal voltage criteria for LVH, may be normal variant ( R in aVL ) Anteroseptal infarct , age undetermined Abnormal ECG When compared with ECG of 24-Dec-2023 20:46, PREVIOUS ECG IS PRESENT  ECHO 08/19/22:  1. Left ventricular ejection fraction, by estimation, is 60 to 65%. The  left ventricle has normal function. The left ventricle has no regional  wall motion abnormalities. Left ventricular diastolic parameters are  indeterminate.   2. Right ventricular systolic function is normal. The right ventricular  size is normal. There is normal pulmonary artery systolic pressure. The  estimated right ventricular systolic pressure is 24.2 mmHg.   3. The mitral valve is normal in structure. Trivial mitral valve  regurgitation.   4. The aortic valve has been repaired/replaced. Aortic valve  regurgitation is not visualized. There is a 23 mm Edwards Sapien  prosthetic (TAVR) valve  present in the aortic position. Echo findings are  consistent with normal structure and function of the  aortic valve prosthesis. Aortic valve mean gradient measures 12.0 mmHg.   5. Aortic dilatation noted.  There is dilatation of the ascending aorta,  measuring 40 mm.   6. The inferior vena cava is normal in size with greater than 50%  respiratory variability, suggesting right atrial pressure of 3 mmHg.    Assessment and Plan: 1. Paroxysmal atrial fibrillation  S/p Afib ablation on 12/17/23 by Dr. Inocencio.  Patient is currently in NSR. She is doing well and overall feels better in terms of energy. Anticipate discontinuation of amiodarone  at upcoming office visit.   High risk medication monitoring (ICD10: [id number]) Patient requires ongoing monitoring for anti-arrhythmic medication which has the potential to cause life threatening arrhythmias or AV block. Qtc stable. Continue amiodarone  200 mg daily.  2. Secondary hypercoagulable state This patients CHA2DS2-VASc Score and unadjusted Ischemic Stroke Rate (% per year) is equal to 7.2 % stroke rate/year from a score of 5 Continue Eliquis .   3. HTN Stable today.   4. Aortic stenosis S/p TAVR.    Follow up with EP as scheduled.   Dorn Heinrich, PA-C [facility] [address] [place], [place] [address] [phone]

## 2024-01-23 ENCOUNTER — Ambulatory Visit
Admission: RE | Admit: 2024-01-23 | Discharge: 2024-01-23 | Disposition: A | Discharge status: Home / Self Care | Source: Ambulatory Visit | Attending: Internal Medicine | Admitting: Internal Medicine

## 2024-01-23 DIAGNOSIS — R519 Headache, unspecified: Secondary | ICD-10-CM

## 2024-02-07 ENCOUNTER — Telehealth: Payer: Self-pay | Admitting: Internal Medicine

## 2024-02-07 NOTE — Telephone Encounter (Signed)
 Inbound call from patient requesting to speak to the nurse about her unresolved issue with discomfort of her abdomen, cramping and nausea. Patient is requesting a call back. Please advise.

## 2024-02-08 NOTE — Telephone Encounter (Signed)
 Pt stated that she is having ongoing symptoms with nausea, discomfort in her abdomen and cramping, constipation.  Pt chart was reviewed and noted recommendations from recent office visit with Dr. Federico.  Recommendations were reviewed with pt.  Pt stated that she never had the MRI done and she never received a SIBO breath test.  [facility] was contacted and stated that they never received an order. Order was faxed along with pt insurance card.  Pt was given the number to schedule her MRI at [facility] and pt was notified that I would leave a SIBO breath test on the second floor for the pt to pick up.  Pt stated that she felt the amitiza  did not help her and no longer taking it.  Pt was recommended that she could use Miralax  daily. Last BM two days ago after taking Miralax .  Pt verbalized understanding with all questions answered.

## 2024-02-08 NOTE — Telephone Encounter (Signed)
 Left message for pt to call back

## 2024-02-11 ENCOUNTER — Encounter: Payer: Self-pay | Admitting: Pulmonary Disease

## 2024-02-11 ENCOUNTER — Other Ambulatory Visit (HOSPITAL_COMMUNITY): Payer: Self-pay | Admitting: Cardiology

## 2024-02-11 ENCOUNTER — Other Ambulatory Visit (HOSPITAL_COMMUNITY): Payer: Self-pay

## 2024-02-11 ENCOUNTER — Other Ambulatory Visit: Payer: Self-pay | Admitting: Cardiology

## 2024-02-11 MED ORDER — ENTRESTO 49-51 MG PO TABS: 1.0000 | ORAL_TABLET | Freq: Two times a day (BID) | ORAL | 11 refills | Status: DC

## 2024-02-11 MED ORDER — SACUBITRIL-VALSARTAN 49-51 MG PO TABS: 1.0000 | ORAL_TABLET | Freq: Two times a day (BID) | ORAL | 1 refills | Status: AC

## 2024-02-26 ENCOUNTER — Ambulatory Visit
Admission: RE | Admit: 2024-02-26 | Discharge: 2024-02-26 | Disposition: A | Discharge status: Home / Self Care | Source: Ambulatory Visit | Attending: Internal Medicine | Admitting: Internal Medicine

## 2024-02-26 DIAGNOSIS — K5904 Chronic idiopathic constipation: Secondary | ICD-10-CM | POA: Diagnosis not present

## 2024-02-26 DIAGNOSIS — R140 Abdominal distension (gaseous): Secondary | ICD-10-CM

## 2024-02-26 DIAGNOSIS — K5900 Constipation, unspecified: Secondary | ICD-10-CM

## 2024-03-06 ENCOUNTER — Ambulatory Visit: Payer: Self-pay | Admitting: Physician Assistant

## 2024-03-06 ENCOUNTER — Other Ambulatory Visit (HOSPITAL_COMMUNITY): Payer: Self-pay | Admitting: Internal Medicine

## 2024-03-06 DIAGNOSIS — M8580 Other specified disorders of bone density and structure, unspecified site: Secondary | ICD-10-CM | POA: Insufficient documentation

## 2024-03-08 ENCOUNTER — Encounter (HOSPITAL_COMMUNITY): Payer: Self-pay | Admitting: Internal Medicine

## 2024-03-20 ENCOUNTER — Encounter (HOSPITAL_COMMUNITY): Payer: Self-pay | Admitting: Internal Medicine

## 2024-03-20 ENCOUNTER — Ambulatory Visit (HOSPITAL_COMMUNITY): Admission: RE | Admit: 2024-03-20 | Discharge: 2024-03-20 | Attending: Internal Medicine | Admitting: Internal Medicine

## 2024-03-20 VITALS — BP 116/70 | HR 59 | Ht 65.0 in | Wt 153.6 lb

## 2024-03-20 DIAGNOSIS — D6869 Other thrombophilia: Secondary | ICD-10-CM | POA: Diagnosis not present

## 2024-03-20 DIAGNOSIS — I480 Paroxysmal atrial fibrillation: Secondary | ICD-10-CM

## 2024-03-20 DIAGNOSIS — Z5181 Encounter for therapeutic drug level monitoring: Secondary | ICD-10-CM

## 2024-03-20 NOTE — Progress Notes (Signed)
 Primary Care Physician: Shayne Anes, MD Referring Physician: Dr. Shayne Primary Cardiologist: Dr Claudene Primary EP: Dr Kelsie   Marissa Powers is a 83 y.o. female with a h/o palpitations, htn, hypothyroidism, CAD.She saw Dr. Shayne, 4/29, for her annual physical and mentioned rapid heart beat in the middle of the night, which sounded suspicious  for afib and referred here for further evaluation. The pt brings in recorded  V/S, where she had an episode for around 4 hours with HR's in the 100-120 bpm range in March. She went around 4-6  weeks before she had similar symptoms. She states that she may snore, but she is a widow so no one in the house to report apnea. She reports that she sleeps well and does not have daytime somnolence. She does drink alcohol  several times a week, 1-2 drinks at a time. She has been on metoprolol  for years for palpitations and will usually take an extra 1/2 tab of metoprolol  when she has an episode. She takes a baby ASA a day. Chadsvasc score is at least 4. No tobacco use, minimal caffeine use. Her son is a scientist, research (life sciences) in [place].  On follow up 08/13/23, she is currently in NSR. Recently in ED on 07/28/23 for Afib with RVR s/p successful DCCV. Metoprolol  increased to 50 mg daily. History of being on amiodarone  which was discontinued April 2024 (she was taking it 3 days a week). She has not had recurrence of Afib since cardioversion. She does not feel at night intermittent sensations of what she believes to be PVCs or a clunking feeling. No missed doses of Eliquis  5 mg BID.   On follow up 01/17/24, patient is currently in NSR. S/p Afib ablation on 12/17/23 by Dr. Inocencio. No episodes of Afib since ablation. She is taking amiodarone  200 mg daily. She was seen in ED on 9/12 for chest pain and SOB. No further episodes of chest pain or SOB. Leg sites healed without issue. No missed doses of anticoagulant.  On follow-up 03/20/2024, patient is currently in NSR.  She has had overall  no A-fib burden since last office visit.  She is taking amiodarone  200 mg daily.  Overall, she feels great and hopes to stop the antiarrhythmic medication today.  No bleeding issues on Eliquis .  Today, she denies symptoms of orthopnea, PND, lower extremity edema, dizziness, presyncope, syncope, snoring, daytime somnolence, bleeding, or neurologic sequela. The patient is tolerating medications without difficulties and is otherwise without complaint today.    Past Medical History:  Diagnosis Date   Arthritis    osteoarthritis   Breast cancer (HCC) 11/2021   left   CAD (coronary artery disease)    Descending thoracic aortic dissection (HCC)    followed by Dr. 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 Verlin & Dr. Lucas .   Severe aortic stenosis    Past Surgical History:  Procedure Laterality Date  ATRIAL FIBRILLATION ABLATION N/A 12/17/2023   Procedure: ATRIAL FIBRILLATION ABLATION;  Surgeon: Inocencio Soyla Lunger, MD;  [facility]: [facility];  Service: Cardiovascular;  Laterality: N/A;   AXILLARY SENTINEL NODE BIOPSY Left 12/29/2021   Procedure: LEFT AXILLARY SENTINEL NODE BIOPSY;  Surgeon: Ebbie Cough, MD;  Location: [facility] OR;  Service: General;  Laterality: Left;   BREAST BIOPSY Bilateral 1970's   x2 one a side   BREAST LUMPECTOMY WITH RADIOACTIVE SEED LOCALIZATION Left 12/29/2021   Procedure: BREAST LUMPECTOMY WITH RADIOACTIVE SEED LOCALIZATION X2;  Surgeon: Ebbie Cough, MD;  Location: [facility] OR;   Service: General;  Laterality: Left;   CATARACT EXTRACTION Bilateral    bilateral   COLONOSCOPY     implantable loop recorder removal  05/30/2021   MDT reveal LINQ removed by Dr Kelsie   INTRAOPERATIVE TRANSTHORACIC ECHOCARDIOGRAM N/A 08/19/2021   Procedure: INTRAOPERATIVE TRANSTHORACIC ECHOCARDIOGRAM;  Surgeon: Verlin Lonni BIRCH, MD;  Location: [facility] OR;  Service: Open Heart Surgery;  Laterality: N/A;   LOOP RECORDER INSERTION N/A 08/11/2017   Procedure: LOOP RECORDER INSERTION;  Surgeon: Kelsie Agent, MD;  [facility]: [facility];  Service: Cardiovascular;  Laterality: N/A;   RADIOACTIVE SEED GUIDED AXILLARY SENTINEL LYMPH NODE Left 12/29/2021   Procedure: LEFT AXILLARY NODE SEED GUIDED EXCISION;  Surgeon: Ebbie Cough, MD;  Location: [facility] OR;  Service: General;  Laterality: Left;   RETINAL DETACHMENT SURGERY  2000   RIGHT/LEFT HEART CATH AND CORONARY ANGIOGRAPHY N/A 06/19/2021   Procedure: RIGHT/LEFT HEART CATH AND CORONARY ANGIOGRAPHY;  Surgeon: Claudene Victory ORN, MD;  Location: [facility];  Service: Cardiovascular;  Laterality: N/A;   THYROIDECTOMY  1967   TONSILLECTOMY  1950   TOTAL KNEE ARTHROPLASTY  04/25/2012   Procedure: TOTAL KNEE BILATERAL;  Surgeon: Dempsey LULLA Moan, MD;  Location: [facility];  Service: Orthopedics;  Laterality: Bilateral;   TRANSCATHETER AORTIC VALVE REPLACEMENT, TRANSFEMORAL N/A 08/19/2021   Procedure: Transcatheter Aortic Valve Replacement, Transfemoral Using 23mm Sapien 3 Ultra Edwards Valve;  Surgeon: Verlin Lonni BIRCH, MD;  Location: [facility] OR;  Service: Open Heart Surgery;  Laterality: N/A;    Current Outpatient Medications  Medication Sig Dispense Refill   acetaminophen  (TYLENOL ) 500 MG tablet Take 1,000 mg by mouth every 6 (six) hours as needed for moderate pain (pain score 4-6).     amiodarone  (PACERONE ) 200 MG tablet Take 200 mg by mouth daily.     apixaban  (ELIQUIS ) 5 MG TABS tablet Take 1 tablet (5 mg total) by mouth 2 (two) times  daily. 60 tablet 6   b complex vitamins capsule Take 1 capsule by mouth daily.     Calcium  Carb-Cholecalciferol  (CALCIUM  + D3 PO) Take 1 tablet by mouth in the morning.     cholecalciferol  (VITAMIN D3) 25 MCG (1000 UNIT) tablet Take 1,000 Units by mouth in the morning.     dapagliflozin  propanediol (FARXIGA ) 10 MG TABS tablet TAKE 1 TABLET(10 MG) BY MOUTH DAILY BEFORE BREAKFAST 30 tablet 11   denosumab  (PROLIA ) 60 MG/ML SOSY injection Inject 60 mg into the skin every 6 (six) months.     folic acid  (FOLVITE ) 1 MG tablet Take 2 mg by mouth in the morning.     hydrochlorothiazide  (MICROZIDE ) 12.5 MG capsule Take 12.5 mg by mouth daily.     hydrocortisone  (ANUSOL -HC) 2.5 % rectal cream Place 1 Application rectally 2 (two) times daily. 30 g 1   letrozole  (FEMARA ) 2.5 MG tablet TAKE 1 TABLET(2.5 MG) BY MOUTH DAILY 90 tablet 3  levothyroxine  (SYNTHROID ) 75 MCG tablet Take 75 mcg by mouth daily before breakfast.     lubiprostone  (AMITIZA ) 8 MCG capsule Take 1 capsule (8 mcg total) by mouth 2 (two) times daily with a meal. 30 capsule 3   metoprolol  succinate (TOPROL -XL) 50 MG 24 hr tablet Take 50 mg by mouth daily.     nitroGLYCERIN  (NITROSTAT ) 0.4 MG SL tablet Place 1 tablet (0.4 mg total) under the tongue every 5 (five) minutes as needed for chest pain. 25 tablet 3   ondansetron  (ZOFRAN -ODT) 4 MG disintegrating tablet Take 4 mg by mouth every 8 (eight) hours as needed for nausea or vomiting.     predniSONE  (DELTASONE ) 1 MG tablet Take 1 mg by mouth daily with breakfast.     REPATHA SURECLICK 140 MG/ML SOAJ Inject 140 mg into the skin every 14 (fourteen) days.     sacubitril -valsartan  (ENTRESTO ) 49-51 MG Take 1 tablet by mouth 2 (two) times daily. 180 tablet 1   zolpidem  (AMBIEN ) 5 MG tablet Take 5 mg by mouth at bedtime.     No current facility-administered medications for this visit.    Allergies  Allergen Reactions   Cashew Nut Oil Anaphylaxis, Swelling and Other (See Comments)     Tingling/facial swelling    Cashew Nut (Anacardium Occidentale) Skin Test Hives   Bee Venom Other (See Comments)    Other Reaction(s): red/swelling   ROS- All systems are reviewed and negative except as per the HPI above  Physical Exam: There were no vitals filed for this visit.   Wt Readings from Last 3 Encounters:  01/17/24 67.6 kg  12/27/23 67.1 kg  12/24/23 67.1 kg    Labs: Lab Results  Component Value Date   NA 139 12/24/2023   K 4.0 12/24/2023   CL 100 12/24/2023   CO2 23 12/24/2023   GLUCOSE 102 (H) 12/24/2023   BUN 21 12/24/2023   CREATININE 1.18 (H) 12/24/2023   CALCIUM  10.9 (H) 12/24/2023   MG 2.1 07/28/2023   Lab Results  Component Value Date   INR 1.4 (H) 06/15/2023   Lab Results  Component Value Date   CHOL 140 02/16/2023   HDL 49 02/16/2023   LDLCALC 66 02/16/2023   TRIG 143 02/16/2023   GEN- The patient is well appearing, alert and oriented x 3 today.   Neck - no JVD or carotid bruit noted Lungs- Clear to ausculation bilaterally, normal work of breathing Heart- Regular rate and rhythm, no murmurs, rubs or gallops, PMI not laterally displaced Extremities- no clubbing, cyanosis, or edema Skin - no rash or ecchymosis noted   EKG-  EKG Interpretation Date/Time:  Monday March 20 2024 13:53:36 EST Ventricular Rate:  59 PR Interval:  206 QRS Duration:  96 QT Interval:  456 QTC Calculation: 451 R Axis:   -13  Text Interpretation: Sinus bradycardia Left ventricular hypertrophy with repolarization abnormality ( R in aVL ) Cannot rule out Anteroseptal infarct (cited on or before 17-Jan-2024) Abnormal ECG When compared with ECG of 17-Jan-2024 13:57, Previous ECG is present Confirmed by Terra Pac [phone]) on 03/20/2024 2:09:25 PM    ECHO 08/19/22:  1. Left ventricular ejection fraction, by estimation, is 60 to 65%. The  left ventricle has normal function. The left ventricle has no regional  wall motion abnormalities. Left ventricular diastolic  parameters are  indeterminate.   2. Right ventricular systolic function is normal. The right ventricular  size is normal. There is normal pulmonary artery systolic pressure. The  estimated right ventricular  systolic pressure is 24.2 mmHg.   3. The mitral valve is normal in structure. Trivial mitral valve  regurgitation.   4. The aortic valve has been repaired/replaced. Aortic valve  regurgitation is not visualized. There is a 23 mm Edwards Sapien  prosthetic (TAVR) valve present in the aortic position. Echo findings are  consistent with normal structure and function of the  aortic valve prosthesis. Aortic valve mean gradient measures 12.0 mmHg.   5. Aortic dilatation noted. There is dilatation of the ascending aorta,  measuring 40 mm.   6. The inferior vena cava is normal in size with greater than 50%  respiratory variability, suggesting right atrial pressure of 3 mmHg.    Assessment and Plan: 1. Paroxysmal atrial fibrillation  S/p Afib ablation on 12/17/23 by Dr. Inocencio.  Patient is currently in NSR.  She is doing well after ablation.  After discussion, we will stop amiodarone  today.  Continue Toprol  50 mg daily.   2. Secondary hypercoagulable state This patients CHA2DS2-VASc Score and unadjusted Ischemic Stroke Rate (% per year) is equal to 7.2 % stroke rate/year from a score of 5 Continue Eliquis  5 mg twice daily.  Dosage is correct based on weight above 60 kg and creatinine below 1.5.  3. HTN Stable today.  4. Aortic stenosis S/p TAVR.    Follow up with Dr. Inocencio in 6 months.  Dorn Heinrich, PA-C [facility] [address] [place], [place] [address] [phone]

## 2024-03-22 ENCOUNTER — Encounter (HOSPITAL_BASED_OUTPATIENT_CLINIC_OR_DEPARTMENT_OTHER): Payer: Self-pay | Admitting: Cardiology

## 2024-03-22 ENCOUNTER — Ambulatory Visit (HOSPITAL_BASED_OUTPATIENT_CLINIC_OR_DEPARTMENT_OTHER): Admitting: Cardiology

## 2024-03-22 VITALS — BP 132/68 | HR 54 | Ht 65.0 in | Wt 153.4 lb

## 2024-03-22 DIAGNOSIS — I480 Paroxysmal atrial fibrillation: Secondary | ICD-10-CM | POA: Diagnosis not present

## 2024-03-22 DIAGNOSIS — I10 Essential (primary) hypertension: Secondary | ICD-10-CM | POA: Diagnosis not present

## 2024-03-22 DIAGNOSIS — Z7901 Long term (current) use of anticoagulants: Secondary | ICD-10-CM | POA: Diagnosis not present

## 2024-03-22 DIAGNOSIS — Z952 Presence of prosthetic heart valve: Secondary | ICD-10-CM | POA: Diagnosis not present

## 2024-03-22 DIAGNOSIS — D6869 Other thrombophilia: Secondary | ICD-10-CM | POA: Diagnosis not present

## 2024-03-22 DIAGNOSIS — I5032 Chronic diastolic (congestive) heart failure: Secondary | ICD-10-CM

## 2024-03-22 DIAGNOSIS — I71012 Dissection of descending thoracic aorta: Secondary | ICD-10-CM | POA: Diagnosis not present

## 2024-03-22 DIAGNOSIS — I2510 Atherosclerotic heart disease of native coronary artery without angina pectoris: Secondary | ICD-10-CM | POA: Diagnosis not present

## 2024-03-22 NOTE — Progress Notes (Signed)
 Cardiology Office Note:  .   Date:  03/22/2024  ID:  Marissa Powers, DOB [birth year], MRN [id number] PCP: Shayne Anes, MD  [facility] Providers Cardiologist:  Marissa Bruckner, MD Electrophysiologist:  Soyla Gladis Norton, MD {  History of Present Illness: .   Marissa Powers is a 83 y.o. female with PMH chronic diastolic heart failure, severe AS s/p TAVR 08/2021, chronic descending aortic dissection, paroxysmal atrial fibrillation, nonobstructive CAD, hypertension, history of temporal arteritis. I met her 09/20/23. She has been followed by Dr. Claudene, Dr. Cherrie in advanced heart failure, Dr. Norton in EP, and Dr. Serene in vascular.  Today: Seen in afib clinic two days ago, stopped amiodarone . She was in sinus rhythm. Hasn't felt afib in a long time. Also hasn't felt PVCs. Has occasional dizzy spells, no syncope.  Walks dog, works with psychologist, educational. No limitations.   Reviewed medications. No bleeding issues. Has some general fatigue, discussed potentially cutting back on metoprolol  in the future.  Had recent brain MRI, showed small chronic cerebellar infarcts, mild chronic small vessel ischemic disease noted.  ROS: Denies chest pain, shortness of breath at rest or with normal exertion. No PND, orthopnea, LE edema or unexpected weight gain. No syncope. ROS otherwise negative except as noted.   Studies Reviewed: SABRA    EKG:       Physical Exam:   VS:  BP 132/68   Pulse (!) 54   Ht 5' 5 (1.651 m)   Wt 153 lb 6.4 oz (69.6 kg)   SpO2 96%   BMI 25.53 kg/m    Wt Readings from Last 3 Encounters:  03/22/24 153 lb 6.4 oz (69.6 kg)  03/20/24 153 lb 9.6 oz (69.7 kg)  01/17/24 149 lb (67.6 kg)    GEN: Well nourished, well developed in no acute distress HEENT: Normal, moist mucous membranes NECK: No JVD CARDIAC: regular rhythm, normal S1 and S2, no rubs or gallops. 1/6 systolic murmur. VASCULAR: Radial and DP pulses 2+ bilaterally. No carotid bruits RESPIRATORY:  Clear to  auscultation without rales, wheezing or rhonchi  ABDOMEN: Soft, non-tender, non-distended MUSCULOSKELETAL:  Ambulates independently SKIN: Warm and dry, no edema NEUROLOGIC:  Alert and oriented x 3. No focal neuro deficits noted. PSYCHIATRIC:  Normal affect    ASSESSMENT AND PLAN: .    Chronic diastolic heart failure -seen previously by Dr. Bensimhon given dyspnea on exertion. CPX 08/2022 with excellent functional capacity -discussed at length today guideline recommended medications. On entresto , farxiga , tolerating. Discussed if needed in the future can change from hydrochlorothiazide  to spironolactone  Paroxysmal atrial fibrillation -chadsvasc=6, secondary hypercoagulable state, continue apixaban  -now s/p ablation 12/17/23. Amiodarone  stopped two days ago -on metoprolol , if she remains out of afib, would consider cutting dose back slightly to avoid bradycardia with diastolic heart failure  Hypertension -well managed, continue meds as above  Severe AS s/p TAVR -valve functioning well -has antibiotics for dental prophylaxis  CAD Hyperlipidemia Mild nonobstructive carotid disease -no aspirin  as she is on apixaban  -continue repatha -no symptoms -reviewed red flag warning signs that need immediate medical attention  Descending aortic dissection, chronic -managed medically, serial CT followed by Dr. Serene  CV risk counseling and prevention -recommend heart healthy/Mediterranean diet, with whole grains, fruits, vegetable, fish, lean meats, nuts, and olive oil. Limit salt. -recommend moderate walking, 3-5 times/week for 30-50 minutes each session. Aim for at least 150 minutes.week. Goal should be pace of 3 miles/hours, or walking 1.5 miles in 30 minutes -recommend avoidance of tobacco products.  Avoid excess alcohol .  Dispo: 9 mos to stagger with Dr. Inocencio  Signed, Marissa Bruckner, MD   Marissa Bruckner, MD, PhD, [facility] [place]  [facility]  [place]   Heart & Vascular at [facility] at [facility] [address] [place], [place] [address] [phone]

## 2024-03-22 NOTE — Patient Instructions (Signed)
 Medication Instructions:  NO CHANGES *If you need a refill on your cardiac medications before your next appointment, please call your pharmacy*  Lab Work: NONE If you have labs (blood work) drawn today and your tests are completely normal, you will receive your results only by: MyChart Message (if you have MyChart) OR A paper copy in the mail If you have any lab test that is abnormal or we need to change your treatment, we will call you to review the results.  Testing/Procedures: NONE  Follow-Up: At [facility], you and your health needs are our priority.  As part of our continuing mission to provide you with exceptional heart care, our providers are all part of one team.  This team includes your primary Cardiologist (physician) and Advanced Practice Providers or APPs (Physician Assistants and Nurse Practitioners) who all work together to provide you with the care you need, when you need it.  Your next appointment:   9 month(s)  Provider:   Shelda Bruckner, MD

## 2024-04-20 ENCOUNTER — Telehealth (HOSPITAL_COMMUNITY): Payer: Self-pay

## 2024-04-20 NOTE — Telephone Encounter (Signed)
 Auth Submission: NO AUTH NEEDED Site of care: Site of care: [facility] Payer: [employer] Medication & CPT/J Code(s) submitted: Prolia  (Denosumab ) [id number] Diagnosis Code: M81.0 Route of submission (phone, fax, portal):  Phone # Fax # Auth type: Buy/Bill HB Units/visits requested: 60mg  q63months Reference number:  Approval from: 04/13/24 to 04/12/25

## 2024-04-21 ENCOUNTER — Encounter (HOSPITAL_COMMUNITY): Payer: Self-pay | Admitting: Internal Medicine

## 2024-04-21 NOTE — Addendum Note (Signed)
 Addended by: DAYNE SHERRY RAMAN on: 04/21/2024 11:34 AM   Modules accepted: Orders

## 2024-04-21 NOTE — Progress Notes (Signed)
 Therapy plan changed to Jubbonti due to payer preference  Sherry Pennant, PharmD, MPH, BCPS, CPP Clinical Pharmacist

## 2024-04-21 NOTE — Telephone Encounter (Signed)
 04/21/24: Patient called. She received a letter from [facility] [employer] that Prolia  is not preferred this year and that Jubbonti is.   Auth Submission: NO AUTH NEEDED Site of care: [facility] Payer: [employer] Medication & CPT/J Code(s) submitted: Jubbonti (denosumab -bbdz) [id number] Diagnosis Code: M81.0 Route of submission (phone, fax, portal):  Phone # Fax # Auth type: Buy/Bill HB Units/visits requested: 60mg  x 2 doses, q 6 months Reference number:  Approval from: 04/21/2024 to 04/12/25    Dagoberto Armour, CPhT [facility] Phone: [phone] 04/21/2024

## 2024-04-22 ENCOUNTER — Other Ambulatory Visit: Payer: Self-pay | Admitting: Hematology and Oncology

## 2024-05-03 ENCOUNTER — Ambulatory Visit: Admitting: Internal Medicine

## 2024-05-03 ENCOUNTER — Encounter: Payer: Self-pay | Admitting: Internal Medicine

## 2024-05-03 VITALS — BP 142/60 | HR 89 | Ht 65.0 in | Wt 158.1 lb

## 2024-05-03 DIAGNOSIS — R140 Abdominal distension (gaseous): Secondary | ICD-10-CM

## 2024-05-03 DIAGNOSIS — K5900 Constipation, unspecified: Secondary | ICD-10-CM | POA: Diagnosis not present

## 2024-05-03 DIAGNOSIS — R1030 Lower abdominal pain, unspecified: Secondary | ICD-10-CM

## 2024-05-03 DIAGNOSIS — N816 Rectocele: Secondary | ICD-10-CM | POA: Diagnosis not present

## 2024-05-03 DIAGNOSIS — K649 Unspecified hemorrhoids: Secondary | ICD-10-CM | POA: Diagnosis not present

## 2024-05-03 NOTE — Progress Notes (Signed)
 "  Chief Complaint: Diarrhea, ab pain, and nausea  HPI : 84 year old female with history of A-fib on Eliquis , prior diverticulitis, CAD, GERD, PMR, aortic stenosis s/p TAVR, breast cancer in remission, arthritis presents for follow-up of ab pain and irregular bowel habits  Patient did not have significant benefit in her nausea from taking pantoprazole  once daily  Interval History: She is feeling better overall. Her lower abdominal cramping has improved. She has a stool usually every day and sometimes every 3 days.  She does still frequently have the urge to have a stool.  She is not sure if the previous course of cipro /flagyl  antibiotics helped with her symptoms. Amitiza  was not well tolerated. Nausea is better.   Wt Readings from Last 3 Encounters:  05/03/24 158 lb 2 oz (71.7 kg)  03/22/24 153 lb 6.4 oz (69.6 kg)  03/20/24 153 lb 9.6 oz (69.7 kg)    Past Medical History:  Diagnosis Date   Allergy 1982   Only cashews   Arthritis    osteoarthritis   Breast cancer (HCC) 11/2021   left   CAD (coronary artery disease)    Descending thoracic aortic dissection (HCC)    followed by Dr. 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 Verlin & Dr. Lucas .   Severe aortic stenosis      Past Surgical History:  Procedure Laterality Date   ATRIAL FIBRILLATION ABLATION N/A 12/17/2023   Procedure: ATRIAL FIBRILLATION ABLATION;  Surgeon: Inocencio Soyla Lunger, MD;  Location: [facility];  Service:  Cardiovascular;  Laterality: N/A;   AXILLARY SENTINEL NODE BIOPSY Left 12/29/2021   Procedure: LEFT AXILLARY SENTINEL NODE BIOPSY;  Surgeon: Ebbie Cough, MD;  Location: [facility] OR;  Service: General;  Laterality: Left;   BREAST BIOPSY Bilateral 1970's   x2 one a side   BREAST LUMPECTOMY WITH RADIOACTIVE SEED LOCALIZATION Left 12/29/2021   Procedure: BREAST LUMPECTOMY WITH RADIOACTIVE SEED LOCALIZATION X2;  Surgeon: Ebbie Cough, MD;  Location[facility];  Service: General;  Laterality: Left;   CATARACT EXTRACTION Bilateral    bilateral   COLONOSCOPY     EYE SURGERY     Cataract   implantable loop recorder removal  05/30/2021   MDT reveal LINQ removed by Dr Kelsie   INTRAOPERATIVE TRANSTHORACIC ECHOCARDIOGRAM N/A 08/19/2021   Procedure: INTRAOPERATIVE TRANSTHORACIC ECHOCARDIOGRAM;  Surgeon: Verlin Lonni BIRCH, MD;  Location: [facility] OR;  Service: Open Heart Surgery;  Laterality: N/A;   JOINT REPLACEMENT     Knees   LOOP RECORDER INSERTION N/A 08/11/2017   Procedure: LOOP RECORDER INSERTION;  Surgeon: Kelsie Agent, MD;  Location: [facility];  Service: Cardiovascular;  Laterality: N/A;   RADIOACTIVE SEED GUIDED AXILLARY SENTINEL LYMPH NODE Left 12/29/2021   Procedure: LEFT AXILLARY NODE SEED GUIDED EXCISION;  Surgeon: Ebbie Cough, MD;  Location: [facility] OR;  Service: General;  Laterality: Left;  RETINAL DETACHMENT SURGERY  2000   RIGHT/LEFT HEART CATH AND CORONARY ANGIOGRAPHY N/A 06/19/2021   Procedure: RIGHT/LEFT HEART CATH AND CORONARY ANGIOGRAPHY;  Surgeon: Claudene Victory ORN, MD;  Location: [facility];  Service: Cardiovascular;  Laterality: N/A;   THYROIDECTOMY  1967   TONSILLECTOMY  1950   TOTAL KNEE ARTHROPLASTY  04/25/2012   Procedure: TOTAL KNEE BILATERAL;  Surgeon: Dempsey LULLA Moan, MD;  Location: [facility];  Service: Orthopedics;  Laterality: Bilateral;   TRANSCATHETER AORTIC VALVE REPLACEMENT, TRANSFEMORAL N/A 08/19/2021   Procedure: Transcatheter Aortic Valve  Replacement, Transfemoral Using 23mm Sapien 3 Ultra Edwards Valve;  Surgeon: Verlin Lonni BIRCH, MD;  Location: [facility] OR;  Service: Open Heart Surgery;  Laterality: N/A;   Family History  Problem Relation Age of Onset   AAA (abdominal aortic aneurysm) Mother 36   Healthy Father    Heart disease Brother        stints   Breast cancer Maternal Aunt 59   Breast cancer Maternal Grandmother 69   Heart attack Maternal Grandmother    Cancer Maternal Grandmother    Heart attack Paternal Grandmother    Heart disease Paternal Grandfather    Hypertension Son    Hypertension Son    Heart disease Paternal Uncle    Colon cancer Neg Hx    Colon polyps Neg Hx    Esophageal cancer Neg Hx    Stomach cancer Neg Hx    Rectal cancer Neg Hx    Social History   Tobacco Use   Smoking status: Never    Passive exposure: Never   Smokeless tobacco: Never   Tobacco comments:    Never smoked 01/17/24  Vaping Use   Vaping status: Never Used  Substance Use Topics   Alcohol  use: Yes    Alcohol /week: 4.0 - 6.0 standard drinks of alcohol     Types: 4 - 6 Standard drinks or equivalent per week    Comment: once a week 03/20/24   Drug use: No   Current Outpatient Medications  Medication Sig Dispense Refill   acetaminophen  (TYLENOL ) 500 MG tablet Take 1,000 mg by mouth every 6 (six) hours as needed for moderate pain (pain score 4-6).     amoxicillin  (AMOXIL ) 500 MG capsule Only when she goes to the dentist     apixaban  (ELIQUIS ) 5 MG TABS tablet Take 1 tablet (5 mg total) by mouth 2 (two) times daily. 60 tablet 6   b complex vitamins capsule Take 1 capsule by mouth daily.     Calcium  Carb-Cholecalciferol  (CALCIUM  + D3 PO) Take 1 tablet by mouth in the morning.     cholecalciferol  (VITAMIN D3) 25 MCG (1000 UNIT) tablet Take 1,000 Units by mouth in the morning.     dapagliflozin  propanediol (FARXIGA ) 10 MG TABS tablet TAKE 1 TABLET(10 MG) BY MOUTH DAILY BEFORE BREAKFAST 30 tablet 11   denosumab  (PROLIA ) 60  MG/ML SOSY injection Inject 60 mg into the skin every 6 (six) months.     folic acid  (FOLVITE ) 1 MG tablet Take 2 mg by mouth in the morning.     hydrochlorothiazide  (MICROZIDE ) 12.5 MG capsule Take 12.5 mg by mouth daily.     hydrocortisone  (ANUSOL -HC) 2.5 % rectal cream Place 1 Application rectally 2 (two) times daily. 30 g 1   letrozole  (FEMARA ) 2.5 MG tablet TAKE 1 TABLET(2.5 MG) BY MOUTH DAILY 90 tablet 3   levothyroxine  (SYNTHROID ) 75 MCG tablet Take 75 mcg by mouth daily before breakfast.     lubiprostone  (  AMITIZA ) 8 MCG capsule Take 1 capsule (8 mcg total) by mouth 2 (two) times daily with a meal. 30 capsule 3   metoprolol  succinate (TOPROL -XL) 50 MG 24 hr tablet Take 50 mg by mouth daily.     nitroGLYCERIN  (NITROSTAT ) 0.4 MG SL tablet Place 1 tablet (0.4 mg total) under the tongue every 5 (five) minutes as needed for chest pain. 25 tablet 3   ondansetron  (ZOFRAN -ODT) 4 MG disintegrating tablet Take 4 mg by mouth every 8 (eight) hours as needed for nausea or vomiting.     predniSONE  (DELTASONE ) 1 MG tablet Take 1 mg by mouth daily with breakfast.     REPATHA SURECLICK 140 MG/ML SOAJ Inject 140 mg into the skin every 14 (fourteen) days.     sacubitril -valsartan  (ENTRESTO ) 49-51 MG Take 1 tablet by mouth 2 (two) times daily. 180 tablet 1   zolpidem  (AMBIEN ) 5 MG tablet Take 5 mg by mouth at bedtime.     No current facility-administered medications for this visit.   Allergies  Allergen Reactions   Cashew Nut Oil Anaphylaxis, Swelling and Other (See Comments)    Tingling/facial swelling    Anacardium Occidentale Hives   Bee Venom Other (See Comments)    Other Reaction(s): red/swelling   Physical Exam: BP (!) 142/60   Pulse 89   Ht 5' 5 (1.651 m)   Wt 158 lb 2 oz (71.7 kg)   BMI 26.31 kg/m  Constitutional: Pleasant,well-developed, female in no acute distress. HEENT: Normocephalic and atraumatic. Conjunctivae are normal. No scleral icterus. Cardiovascular: Normal rate, regular  rhythm.  Pulmonary/chest: Effort normal and breath sounds normal. No wheezing, rales or rhonchi. Abdominal: Soft, nondistended, nontender. Bowel sounds active throughout. There are no masses palpable. No hepatomegaly. Extremities: No edema Neurological: Alert and oriented to person place and time. Skin: Skin is warm and dry. No rashes noted. Psychiatric: Normal mood and affect. Behavior is normal.  Labs 02/2023: CMP nml.  TSH normal.  Labs 06/2023: CBC unremarkable.  BMP unremarkable.  BNP mildly elevated.  INR mildly elevated at 1.4. TTG IgA negative. IgA nml. TSH low at 0.28. Alpha gal negative. CRP nml.   Labs 07/2023: CBC and CMP unremarkable.  CTA A/P w/contrast 11/07/21: IMPRESSION: VASCULAR 1. Small focal dissection of the abdominal aorta which begins at the level of the SMA and terminates above the bilateral renal arteries. 2. See separately dictated same day CT of the chest for discussion of findings above the diaphragm. NON-VASCULAR 1. Mild soft tissue stranding is seen about the sigmoid colon, decreased when compared with prior CT of the abdomen and pelvis, compatible with resolving acute diverticulitis.  CTA C/A/P 12/24/23: IMPRESSION: CTA of the chest: Stable chronic type B dissection in the descending thoracic aorta. No acute abnormality is noted. CTA of the abdomen and pelvis: No acute arterial abnormality is noted. Diverticulosis without diverticulitis. Uterine fibroid change.  MRI Defecography 02/26/24: IMPRESSION: Multi compartment pelvic floor prolapse, including moderate cystocele with urethral hypermobility, mild uterine prolapse, and medium-size rectocele. Sigmoid diverticulosis, without signs of diverticulitis. Several tiny uterine fibroids, measuring up to 11 mm in size  Colonoscopy 09/09/23: - Two sessile polyps were found in the ascending colon and cecum. The polyps were 3 to 6 mm in size. These polyps were removed with a cold snare. Resection and  retrieval were complete. - Multiple diverticula were found in the sigmoid colon and descending colon. - A localized area of erythematous mucosa was found in the sigmoid colon. This was biopsied with a cold  forceps. - An 11 mm polyp was found in the sigmoid colon. The polyp was pedunculated. The polyp was removed with a hot snare. Resection and retrieval were complete. - A 4 mm polyp was found in the sigmoid colon. The polyp was sessile. The polyp was removed with a cold snare. Resection and retrieval were complete. - Non- bleeding internal hemorrhoids were found during retroflexion. Path: 1. Surgical [P], colon, sigmoid, polyp (2) :      -TUBULAR ADENOMAS, ONE FRAGMENT WITH STALK (STALK MARGIN NEGATIVE FOR      ADENOMATOUS CHANGE).      -NO HIGH-GRADE DYSPLASIA OR MALIGNANCY      2. Surgical [P], colon, ascending and cecum, polyp (2) :      -TUBULAR ADENOMA, NEGATIVE FOR HIGH-GRADE DYSPLASIA.      -SERRATED MUCOSAL POLYP, SMALL, WITH MILD BASAL CRYPT GROWTH ABNORMALITY, CANNOT      EXCLUDE SESSILE SERRATED POLYP/LESION (SSP/SSL), NEGATIVE FOR DYSPLASIA.      3. Surgical [P], colon, sigmoid :      - COLONIC MUCOSA WITH NO FEATURES DIAGNOSTIC FOR A SPECIFIC TYPE OF MUCOSAL POLYP.      - NEGATIVE FOR ADENOMATOUS CHANGE AND POLYPOID EPITHELIAL SERRATION.      - NEGATIVE FOR MALIGNANCY.   ASSESSMENT AND PLAN:  Alternating constipation and diarrhea, difficulty passing stools Rectocele Cystocele Lower ab pain Nausea Hemorrhoids Patient fortunately has had improvement in her bowel habits as well as in her lower abdominal pain and nausea.  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  Her MRI difficulty did confirm that she has a moderate rectocele, mild uterine prolapse, and moderate cystocele.  Rectocele is likely contributing to her sense of incomplete defecation and difficulty  passing BMs at times.  I asked that she start a daily fiber supplement and discuss with her gynecologist about whether or not a pessary would benefit her symptoms.  She has previously not responded to pelvic floor PT. - Start Metamucil gummy QD - Gas-X PRN - Talk with gynecologist about idea of pessary - RTC PRN  Estefana Kidney, MD  I spent 35 minutes of time, including in depth chart review, independent review of results as outlined above, communicating results with the patient directly, face-to-face time with the patient, coordinating care, ordering studies and medications as appropriate, and documentation.   "

## 2024-05-03 NOTE — Patient Instructions (Addendum)
 Please purchase the following medications over the counter and take as directed: Metamucil gummies  Please contact your Gynecologist regarding a pessary  Thank you for entrusting me with your care and for choosing [facility], Dr. Estefana Kidney   _______________________________________________________  If your blood pressure at your visit was 140/90 or greater, please contact your primary care physician to follow up on this.  _______________________________________________________  If you are age 70 or older, your body mass index should be between 23-30. Your Body mass index is 26.31 kg/m. If this is out of the aforementioned range listed, please consider follow up with your Primary Care Provider.  If you are age 84 or younger, your body mass index should be between 19-25. Your Body mass index is 26.31 kg/m. If this is out of the aformentioned range listed, please consider follow up with your Primary Care Provider.   ________________________________________________________  The [facility] providers would like to encourage you to use MYCHART to communicate with providers for non-urgent requests or questions.  Due to long hold times on the telephone, sending your provider a message by [facility] may be a faster and more efficient way to get a response.  Please allow 48 business hours for a response.  Please remember that this is for non-urgent requests.  _______________________________________________________  [facility] is using a team-based approach to care.  Your team is made up of your doctor and two to three APPS. Our APPS (Nurse Practitioners and Physician Assistants) work with your physician to ensure care continuity for you. They are fully qualified to address your health concerns and develop a treatment plan. They communicate directly with your gastroenterologist to care for you. Seeing the Advanced Practice Practitioners on your physician's team can help you by  facilitating care more promptly, often allowing for earlier appointments, access to diagnostic testing, procedures, and other specialty referrals.   Due to recent changes in healthcare laws, you may see the results of your imaging and laboratory studies on MyChart before your provider has had a chance to review them.  We understand that in some cases there may be results that are confusing or concerning to you. Not all laboratory results come back in the same time frame and the provider may be waiting for multiple results in order to interpret others.  Please give us  48 hours in order for your provider to thoroughly review all the results before contacting the office for clarification of your results.

## 2024-05-22 ENCOUNTER — Inpatient Hospital Stay (HOSPITAL_COMMUNITY): Admission: RE | Admit: 2024-05-22 | Source: Ambulatory Visit

## 2024-05-22 ENCOUNTER — Ambulatory Visit

## 2024-10-11 ENCOUNTER — Ambulatory Visit (HOSPITAL_BASED_OUTPATIENT_CLINIC_OR_DEPARTMENT_OTHER): Payer: Self-pay | Admitting: Obstetrics & Gynecology

## 2024-11-20 ENCOUNTER — Encounter (HOSPITAL_COMMUNITY)

## 2024-12-26 ENCOUNTER — Ambulatory Visit: Admitting: Hematology and Oncology
